# Patient Record
Sex: Female | Born: 1960 | Race: White | Hispanic: No | Smoking: Current every day smoker
Health system: Southern US, Community
[De-identification: ages and names within clinical notes are randomized; demographics above are authoritative.]

## PROBLEM LIST (undated history)

## (undated) ENCOUNTER — Ambulatory Visit (HOSPITAL_COMMUNITY): Disposition: A | Discharge status: Other Acute Inpatient Hospital | Payer: 59

## (undated) ENCOUNTER — Emergency Department (HOSPITAL_COMMUNITY): Admission: EM | Payer: Medicare Other

## (undated) DIAGNOSIS — M1990 Unspecified osteoarthritis, unspecified site: Secondary | ICD-10-CM

## (undated) DIAGNOSIS — F1010 Alcohol abuse, uncomplicated: Secondary | ICD-10-CM

## (undated) DIAGNOSIS — I469 Cardiac arrest, cause unspecified: Secondary | ICD-10-CM

## (undated) DIAGNOSIS — R55 Syncope and collapse: Secondary | ICD-10-CM

## (undated) DIAGNOSIS — F4310 Post-traumatic stress disorder, unspecified: Secondary | ICD-10-CM

## (undated) DIAGNOSIS — F410 Panic disorder [episodic paroxysmal anxiety] without agoraphobia: Secondary | ICD-10-CM

## (undated) DIAGNOSIS — G709 Myoneural disorder, unspecified: Secondary | ICD-10-CM

## (undated) DIAGNOSIS — E785 Hyperlipidemia, unspecified: Secondary | ICD-10-CM

## (undated) DIAGNOSIS — F329 Major depressive disorder, single episode, unspecified: Secondary | ICD-10-CM

## (undated) DIAGNOSIS — G8929 Other chronic pain: Secondary | ICD-10-CM

## (undated) DIAGNOSIS — K760 Fatty (change of) liver, not elsewhere classified: Secondary | ICD-10-CM

## (undated) DIAGNOSIS — I442 Atrioventricular block, complete: Secondary | ICD-10-CM

## (undated) DIAGNOSIS — F419 Anxiety disorder, unspecified: Secondary | ICD-10-CM

## (undated) DIAGNOSIS — T8859XA Other complications of anesthesia, initial encounter: Secondary | ICD-10-CM

## (undated) DIAGNOSIS — F32A Depression, unspecified: Secondary | ICD-10-CM

## (undated) DIAGNOSIS — F319 Bipolar disorder, unspecified: Secondary | ICD-10-CM

## (undated) DIAGNOSIS — I10 Essential (primary) hypertension: Secondary | ICD-10-CM

## (undated) DIAGNOSIS — R160 Hepatomegaly, not elsewhere classified: Secondary | ICD-10-CM

## (undated) DIAGNOSIS — T7840XA Allergy, unspecified, initial encounter: Secondary | ICD-10-CM

## (undated) DIAGNOSIS — K852 Alcohol induced acute pancreatitis without necrosis or infection: Secondary | ICD-10-CM

## (undated) DIAGNOSIS — F909 Attention-deficit hyperactivity disorder, unspecified type: Secondary | ICD-10-CM

## (undated) DIAGNOSIS — M810 Age-related osteoporosis without current pathological fracture: Secondary | ICD-10-CM

## (undated) DIAGNOSIS — Z95810 Presence of automatic (implantable) cardiac defibrillator: Secondary | ICD-10-CM

## (undated) DIAGNOSIS — F429 Obsessive-compulsive disorder, unspecified: Secondary | ICD-10-CM

## (undated) DIAGNOSIS — E119 Type 2 diabetes mellitus without complications: Secondary | ICD-10-CM

## (undated) DIAGNOSIS — K863 Pseudocyst of pancreas: Secondary | ICD-10-CM

## (undated) DIAGNOSIS — S8261XA Displaced fracture of lateral malleolus of right fibula, initial encounter for closed fracture: Secondary | ICD-10-CM

## (undated) DIAGNOSIS — K219 Gastro-esophageal reflux disease without esophagitis: Secondary | ICD-10-CM

## (undated) HISTORY — DX: Hyperlipidemia, unspecified: E78.5

## (undated) HISTORY — PX: FINGER SURGERY: SHX640

## (undated) HISTORY — PX: ABDOMINAL HYSTERECTOMY: SHX81

## (undated) HISTORY — DX: Atrioventricular block, complete: I44.2

## (undated) HISTORY — DX: Unspecified osteoarthritis, unspecified site: M19.90

## (undated) HISTORY — PX: MYRINGOTOMY: SUR874

## (undated) HISTORY — DX: Allergy, unspecified, initial encounter: T78.40XA

## (undated) HISTORY — DX: Hepatomegaly, not elsewhere classified: R16.0

## (undated) HISTORY — DX: Pseudocyst of pancreas: K86.3

## (undated) HISTORY — PX: TONSILLECTOMY: SUR1361

## (undated) HISTORY — DX: Syncope and collapse: R55

## (undated) HISTORY — PX: PACEMAKER PLACEMENT: SHX43

## (undated) HISTORY — PX: ANTERIOR AND POSTERIOR REPAIR: SHX5121

## (undated) HISTORY — DX: Fatty (change of) liver, not elsewhere classified: K76.0

## (undated) HISTORY — PX: RHINOPLASTY: SUR1284

## (undated) HISTORY — PX: OTHER SURGICAL HISTORY: SHX169

## (undated) HISTORY — PX: TUBAL LIGATION: SHX77

## (undated) HISTORY — PX: BUNIONECTOMY: SHX129

## (undated) HISTORY — DX: Cardiac arrest, cause unspecified: I46.9

---
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
## 1998-05-27 ENCOUNTER — Emergency Department (HOSPITAL_COMMUNITY): Admission: EM | Admit: 1998-05-27 | Discharge: 1998-05-27 | Payer: Self-pay | Admitting: Emergency Medicine

## 1998-05-29 ENCOUNTER — Encounter: Admission: RE | Admit: 1998-05-29 | Discharge: 1998-08-27 | Payer: Self-pay | Admitting: Internal Medicine

## 1998-07-17 ENCOUNTER — Encounter: Admission: RE | Admit: 1998-07-17 | Discharge: 1998-10-15 | Payer: Self-pay | Admitting: *Deleted

## 1998-09-04 ENCOUNTER — Encounter: Admission: RE | Admit: 1998-09-04 | Discharge: 1998-12-03 | Payer: Self-pay | Admitting: Orthopedic Surgery

## 2000-11-05 ENCOUNTER — Inpatient Hospital Stay (HOSPITAL_COMMUNITY): Admission: AD | Admit: 2000-11-05 | Discharge: 2000-11-05 | Payer: Self-pay | Admitting: Obstetrics & Gynecology

## 2001-04-10 ENCOUNTER — Inpatient Hospital Stay (HOSPITAL_COMMUNITY): Admission: AD | Admit: 2001-04-10 | Discharge: 2001-04-10 | Payer: Self-pay | Admitting: Obstetrics and Gynecology

## 2001-04-18 ENCOUNTER — Ambulatory Visit (HOSPITAL_COMMUNITY): Admission: RE | Admit: 2001-04-18 | Discharge: 2001-04-18 | Payer: Self-pay | Admitting: Obstetrics and Gynecology

## 2001-04-18 ENCOUNTER — Encounter: Payer: Self-pay | Admitting: Obstetrics and Gynecology

## 2001-05-11 ENCOUNTER — Encounter: Payer: Self-pay | Admitting: Obstetrics and Gynecology

## 2001-05-11 ENCOUNTER — Ambulatory Visit (HOSPITAL_COMMUNITY): Admission: RE | Admit: 2001-05-11 | Discharge: 2001-05-11 | Payer: Self-pay | Admitting: Obstetrics and Gynecology

## 2001-05-15 ENCOUNTER — Ambulatory Visit (HOSPITAL_COMMUNITY): Admission: RE | Admit: 2001-05-15 | Discharge: 2001-05-15 | Payer: Self-pay | Admitting: Obstetrics and Gynecology

## 2001-05-31 ENCOUNTER — Other Ambulatory Visit: Admission: RE | Admit: 2001-05-31 | Discharge: 2001-05-31 | Payer: Self-pay | Admitting: Obstetrics and Gynecology

## 2001-07-24 ENCOUNTER — Other Ambulatory Visit: Admission: RE | Admit: 2001-07-24 | Discharge: 2001-07-24 | Payer: Self-pay | Admitting: Obstetrics and Gynecology

## 2002-04-08 ENCOUNTER — Inpatient Hospital Stay (HOSPITAL_COMMUNITY): Admission: AD | Admit: 2002-04-08 | Discharge: 2002-04-08 | Payer: Self-pay | Admitting: Obstetrics and Gynecology

## 2002-04-10 ENCOUNTER — Inpatient Hospital Stay (HOSPITAL_COMMUNITY): Admission: AD | Admit: 2002-04-10 | Discharge: 2002-04-10 | Payer: Self-pay | Admitting: Obstetrics and Gynecology

## 2002-05-01 ENCOUNTER — Inpatient Hospital Stay (HOSPITAL_COMMUNITY): Admission: AD | Admit: 2002-05-01 | Discharge: 2002-05-01 | Payer: Self-pay | Admitting: Obstetrics and Gynecology

## 2002-05-02 ENCOUNTER — Ambulatory Visit (HOSPITAL_COMMUNITY): Admission: RE | Admit: 2002-05-02 | Discharge: 2002-05-02 | Payer: Self-pay | Admitting: Obstetrics and Gynecology

## 2002-05-02 ENCOUNTER — Encounter: Payer: Self-pay | Admitting: Obstetrics and Gynecology

## 2002-05-16 ENCOUNTER — Ambulatory Visit (HOSPITAL_COMMUNITY): Admission: RE | Admit: 2002-05-16 | Discharge: 2002-05-16 | Payer: Self-pay | Admitting: Obstetrics and Gynecology

## 2002-05-16 ENCOUNTER — Encounter: Payer: Self-pay | Admitting: Obstetrics and Gynecology

## 2002-05-21 ENCOUNTER — Inpatient Hospital Stay (HOSPITAL_COMMUNITY): Admission: AD | Admit: 2002-05-21 | Discharge: 2002-05-21 | Payer: Self-pay | Admitting: Obstetrics and Gynecology

## 2002-05-25 ENCOUNTER — Inpatient Hospital Stay (HOSPITAL_COMMUNITY): Admission: AD | Admit: 2002-05-25 | Discharge: 2002-05-25 | Payer: Self-pay | Admitting: Obstetrics and Gynecology

## 2002-06-01 ENCOUNTER — Inpatient Hospital Stay (HOSPITAL_COMMUNITY): Admission: AD | Admit: 2002-06-01 | Discharge: 2002-06-01 | Payer: Self-pay | Admitting: Obstetrics and Gynecology

## 2002-07-23 ENCOUNTER — Inpatient Hospital Stay (HOSPITAL_COMMUNITY): Admission: AD | Admit: 2002-07-23 | Discharge: 2002-07-23 | Payer: Self-pay | Admitting: Obstetrics and Gynecology

## 2002-07-27 ENCOUNTER — Inpatient Hospital Stay (HOSPITAL_COMMUNITY): Admission: AD | Admit: 2002-07-27 | Discharge: 2002-07-27 | Payer: Self-pay | Admitting: Obstetrics and Gynecology

## 2002-08-21 ENCOUNTER — Encounter: Payer: Self-pay | Admitting: Obstetrics and Gynecology

## 2002-08-21 ENCOUNTER — Inpatient Hospital Stay (HOSPITAL_COMMUNITY): Admission: AD | Admit: 2002-08-21 | Discharge: 2002-08-25 | Payer: Self-pay | Admitting: Obstetrics and Gynecology

## 2002-08-26 ENCOUNTER — Encounter: Admission: RE | Admit: 2002-08-26 | Discharge: 2002-09-25 | Payer: Self-pay | Admitting: Obstetrics and Gynecology

## 2002-09-26 ENCOUNTER — Encounter: Admission: RE | Admit: 2002-09-26 | Discharge: 2002-10-26 | Payer: Self-pay | Admitting: Obstetrics and Gynecology

## 2002-10-27 ENCOUNTER — Encounter: Admission: RE | Admit: 2002-10-27 | Discharge: 2002-11-26 | Payer: Self-pay | Admitting: Obstetrics and Gynecology

## 2002-12-27 ENCOUNTER — Encounter: Admission: RE | Admit: 2002-12-27 | Discharge: 2003-01-26 | Payer: Self-pay | Admitting: Obstetrics and Gynecology

## 2003-01-27 ENCOUNTER — Encounter: Admission: RE | Admit: 2003-01-27 | Discharge: 2003-02-26 | Payer: Self-pay | Admitting: Obstetrics and Gynecology

## 2003-12-28 HISTORY — PX: TUBAL LIGATION: SHX77

## 2004-01-14 ENCOUNTER — Other Ambulatory Visit: Admission: RE | Admit: 2004-01-14 | Discharge: 2004-01-14 | Payer: Self-pay | Admitting: Obstetrics and Gynecology

## 2004-02-18 ENCOUNTER — Inpatient Hospital Stay (HOSPITAL_COMMUNITY): Admission: AD | Admit: 2004-02-18 | Discharge: 2004-02-18 | Payer: Self-pay | Admitting: Obstetrics and Gynecology

## 2004-02-19 ENCOUNTER — Inpatient Hospital Stay (HOSPITAL_COMMUNITY): Admission: AD | Admit: 2004-02-19 | Discharge: 2004-02-19 | Payer: Self-pay | Admitting: Obstetrics and Gynecology

## 2004-03-04 ENCOUNTER — Ambulatory Visit (HOSPITAL_COMMUNITY): Admission: RE | Admit: 2004-03-04 | Discharge: 2004-03-04 | Payer: Self-pay | Admitting: Obstetrics and Gynecology

## 2004-03-15 ENCOUNTER — Inpatient Hospital Stay (HOSPITAL_COMMUNITY): Admission: AD | Admit: 2004-03-15 | Discharge: 2004-03-15 | Payer: Self-pay | Admitting: Obstetrics and Gynecology

## 2004-03-22 ENCOUNTER — Encounter (HOSPITAL_COMMUNITY): Admission: RE | Admit: 2004-03-22 | Discharge: 2004-04-21 | Payer: Self-pay | Admitting: Obstetrics and Gynecology

## 2004-04-22 ENCOUNTER — Encounter (HOSPITAL_COMMUNITY): Admission: RE | Admit: 2004-04-22 | Discharge: 2004-05-22 | Payer: Self-pay | Admitting: Obstetrics and Gynecology

## 2004-05-03 ENCOUNTER — Inpatient Hospital Stay (HOSPITAL_COMMUNITY): Admission: AD | Admit: 2004-05-03 | Discharge: 2004-05-03 | Payer: Self-pay | Admitting: Obstetrics and Gynecology

## 2004-06-05 ENCOUNTER — Encounter (HOSPITAL_COMMUNITY): Admission: RE | Admit: 2004-06-05 | Discharge: 2004-07-05 | Payer: Self-pay | Admitting: Obstetrics and Gynecology

## 2004-07-21 ENCOUNTER — Inpatient Hospital Stay (HOSPITAL_COMMUNITY): Admission: AD | Admit: 2004-07-21 | Discharge: 2004-07-23 | Payer: Self-pay | Admitting: Obstetrics and Gynecology

## 2007-12-28 HISTORY — PX: ANTERIOR AND POSTERIOR REPAIR: SHX5121

## 2007-12-28 HISTORY — PX: ABDOMINAL HYSTERECTOMY: SHX81

## 2013-03-05 ENCOUNTER — Emergency Department (HOSPITAL_BASED_OUTPATIENT_CLINIC_OR_DEPARTMENT_OTHER)
Admission: EM | Admit: 2013-03-05 | Discharge: 2013-03-05 | Discharge status: Against Medical Advice | Payer: BC Managed Care – PPO | Attending: Emergency Medicine | Admitting: Emergency Medicine

## 2013-03-05 ENCOUNTER — Encounter (HOSPITAL_BASED_OUTPATIENT_CLINIC_OR_DEPARTMENT_OTHER): Payer: Self-pay

## 2013-03-05 DIAGNOSIS — Z046 Encounter for general psychiatric examination, requested by authority: Secondary | ICD-10-CM | POA: Insufficient documentation

## 2013-03-05 HISTORY — DX: Other chronic pain: G89.29

## 2013-03-05 HISTORY — DX: Panic disorder (episodic paroxysmal anxiety): F41.0

## 2013-03-05 HISTORY — DX: Obsessive-compulsive disorder, unspecified: F42.9

## 2013-03-05 HISTORY — DX: Anxiety disorder, unspecified: F41.9

## 2013-03-05 HISTORY — DX: Attention-deficit hyperactivity disorder, unspecified type: F90.9

## 2013-03-05 HISTORY — DX: Bipolar disorder, unspecified: F31.9

## 2013-03-05 NOTE — ED Notes (Signed)
Pt seated in triage room to await treatment room-advised and agreeable

## 2013-03-05 NOTE — ED Notes (Addendum)
Pt states she was brought in by [employer] police after her father called 911-pt states her father called b/c she was in the bed crying-pt states is currently being treated for bipolar and having meds (lamictal) adjusted-pt alert/cooperative/tearful in triage

## 2013-03-05 NOTE — ED Notes (Signed)
Pt was seen leaving the premises with pt belongings in NAD. Pt was not SI or HI upon triage assessment.

## 2013-03-05 NOTE — ED Notes (Signed)
No beds available in tx area-walked to the back to advise charge RN of pt status-pt and belongings are not in triage room-EMT walked premises and security checked cameras-pt not on property

## 2013-11-07 ENCOUNTER — Ambulatory Visit (INDEPENDENT_AMBULATORY_CARE_PROVIDER_SITE_OTHER): Payer: BC Managed Care – PPO | Admitting: Family Medicine

## 2013-11-07 VITALS — BP 140/90 | HR 98 | Temp 98.4°F | Resp 18 | Ht 65.0 in | Wt 191.0 lb

## 2013-11-07 DIAGNOSIS — B079 Viral wart, unspecified: Secondary | ICD-10-CM

## 2013-11-07 DIAGNOSIS — M79609 Pain in unspecified limb: Secondary | ICD-10-CM

## 2013-11-07 NOTE — Progress Notes (Signed)
Subjective:    Patient ID: Brittney Cooper, female    DOB: [birth year], 52 y.o.   MRN: [id number]  This chart was scribed for Brittney Chick, MD by Greggory Stallion, Medical Scribe. This patient's care was started at 4:39 PM.  HPI HPI Comments: Brittney Cooper is a 52 y.o. female who presents to the office complaining of warts on her left index and middle fingers and one on her right middle finger that have worsened over the last year. She states she has had them shaven but not frozen. Pt states the doctor that did it advised her to return if she the warts start coming back. She states the one on her left middle finger is starting to hurt.   There are no active problems to display for this patient.  Past Medical History  Diagnosis Date  . Bipolar disorder   . Anxiety   . Panic attack   . Chronic pain   . OCD (obsessive compulsive disorder)   . ADHD (attention deficit hyperactivity disorder)    Past Surgical History  Procedure Laterality Date  . Myringotomy    . Tonsillectomy    . Rhinoplasty    . Abdominal hysterectomy    . Anterior and posterior repair    . Finfger surgery    . Finger surgery     Allergies  Allergen Reactions  . Codeine Nausea And Vomiting  . Demerol [Meperidine] Other (See Comments)    Hallucinations    Prior to Admission medications   Medication Sig Start Date End Date Taking? Authorizing Provider  ALPRAZolam (XANAX PO) Take by mouth.   Yes Historical Provider, MD  Amphetamine-Dextroamphetamine (ADDERALL PO) Take by mouth.   Yes Historical Provider, MD  Sertraline HCl (ZOLOFT PO) Take by mouth.   Yes Historical Provider, MD  TraZODone & Diet Manage Prod (TRAZAMINE PO) Take by mouth.   Yes Historical Provider, MD  LamoTRIgine (LAMICTAL PO) Take by mouth.    Historical Provider, MD   History   Social History  . Marital Status: Married    Spouse Name: N/A    Number of Children: N/A  . Years of Education: N/A   Occupational History  . Not on file.    Social History Main Topics  . Smoking status: Current Every Day Smoker  . Smokeless tobacco: Not on file  . Alcohol Use: Yes  . Drug Use: No  . Sexual Activity: Not on file   Other Topics Concern  . Not on file   Social History Narrative  . No narrative on file    Review of Systems  Skin:       Warts      Objective:   Physical Exam  Constitutional: She is oriented to person, place, and time. She appears well-developed and well-nourished. No distress.  HENT:  Head: Normocephalic and atraumatic.  Eyes: EOM are normal.  Neck: Neck supple. No tracheal deviation present.  Cardiovascular: Normal rate.   Pulmonary/Chest: Effort normal. No respiratory distress.  Musculoskeletal: Normal range of motion.  Neurological: She is alert and oriented to person, place, and time.  Skin: Skin is warm and dry.  Left hand has warty lesion on distal third finger extending to lateral nailbed. Similar warty lesion on second distal finger along lateral nailbed. Small warty lesion R third midshaft.  Psychiatric: She has a normal mood and affect. Her behavior is normal.   Procedure note:  Consent obtained by patient. Warty lesions on second and third digit of left  hand shaved down. Cryo therapy for 10 seconds to lesions on second and third on left hand. Cryo therapy to third finger on right hand.  Cryotherapy repeated x 3.  Filed Vitals:   11/07/13 1559  BP: 140/90  Pulse: 98  Temp: 98.4 F (36.9 C)  TempSrc: Oral  Resp: 18  Height: 5\' 5"  (1.651 m)  Weight: 191 lb (86.637 kg)  SpO2: 97%      Assessment & Plan:  Wart   1.  Warty lesions fingers x 3: New.  S/p shaving and cryotherapy to warty lesions; recommended RTC in 2-4 weeks for repeat cryotherapy.   I personally performed the services described in this documentation, which was scribed in my presence.  The recorded information has been reviewed and is accurate.

## 2013-11-07 NOTE — Patient Instructions (Signed)
1.  Return in 2-4 weeks for repeat freezing of warts if still present. 2.  Keep warts clean for the next week.

## 2013-12-19 ENCOUNTER — Ambulatory Visit (INDEPENDENT_AMBULATORY_CARE_PROVIDER_SITE_OTHER): Payer: BC Managed Care – PPO | Admitting: Family Medicine

## 2013-12-19 VITALS — BP 120/74 | HR 103 | Temp 98.1°F | Resp 16 | Ht 65.0 in | Wt 190.4 lb

## 2013-12-19 DIAGNOSIS — B079 Viral wart, unspecified: Secondary | ICD-10-CM

## 2013-12-19 NOTE — Progress Notes (Signed)
°  This chart was scribed for Elvina Sidle, MD by Joaquin Music, ED Scribe. This patient was seen in room Room/bed 10 and the patient's care was started at 11:02 AM. Subjective:    Patient ID: Brittney Cooper, female    DOB: Jul 05, 1961, 52 y.o.   MRN: [id number] Chief Complaint  Patient presents with   wart removal    on fingers of left hand   HPI Brittney Cooper is a 52 y.o. female who presents to the [facility] complaining of wart removal. Pt states she had her wart removed last month and states the warts are now returning. Pt reports having the warts for about 3 years. She states she has stopped biting her nails this month.  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  Review of Systems  Skin: Positive for rash.   Objective:   Physical Exam  Skin:  L index and Middle & R middle has warts.    BP 120/74   Pulse 103   Temp(Src) 98.1 F (36.7 C) (Oral)   Resp 16   Ht 5\' 5"  (1.651 m)   Wt 190 lb 6.4 oz (86.365 kg)   BMI 31.68 kg/m2   SpO2 96% Assessment & Plan:  Recheck 1 month  Warts  Signed, Elvina Sidle, MD

## 2014-09-05 ENCOUNTER — Ambulatory Visit (INDEPENDENT_AMBULATORY_CARE_PROVIDER_SITE_OTHER): Payer: BC Managed Care – PPO | Admitting: Family Medicine

## 2014-09-05 ENCOUNTER — Encounter: Payer: Self-pay | Admitting: Family Medicine

## 2014-09-05 VITALS — BP 138/85 | HR 86 | Ht 64.0 in | Wt 185.0 lb

## 2014-09-05 DIAGNOSIS — M25519 Pain in unspecified shoulder: Secondary | ICD-10-CM

## 2014-09-05 DIAGNOSIS — M25512 Pain in left shoulder: Secondary | ICD-10-CM

## 2014-09-05 MED ORDER — METHYLPREDNISOLONE ACETATE 40 MG/ML IJ SUSP
40.0000 mg | Freq: Once | INTRAMUSCULAR | Status: AC
  Administered 2014-09-05: 40 mg via INTRA_ARTICULAR

## 2014-09-05 NOTE — Patient Instructions (Signed)
You have rotator cuff impingement Try to avoid painful activities (overhead activities, lifting with extended arm) as much as possible. Aleve 2 tabs twice a day with food OR ibuprofen 3 tabs three times a day with food for pain and inflammation. Can take tylenol in addition to this. Subacromial injection may be beneficial to help with pain and to decrease inflammation - you were given this today. Consider physical therapy with transition to home exercise program. Do home exercise program with theraband and scapular stabilization exercises daily - these are very important for long term relief even if an injection was given.  3 sets of 10 once a day - wait a few days before starting this. If not improving at follow-up we will consider further imaging, physical therapy and/or nitro patches. Follow up with me in 1 month to 6 weeks.

## 2014-09-09 ENCOUNTER — Encounter: Payer: Self-pay | Admitting: Family Medicine

## 2014-09-09 DIAGNOSIS — M25512 Pain in left shoulder: Secondary | ICD-10-CM | POA: Insufficient documentation

## 2014-09-09 NOTE — Assessment & Plan Note (Signed)
2/2 rotator cuff impingement.  Start home exercise program.  Tylenol, nsaids.  Subacromial injection given today.  F/u in 1 month to 6 weeks.  Consider PT, nitro patches if not improving.  After informed written consent, patient was seated on exam table. Left shoulder was prepped with alcohol swab and utilizing posterior approach, patient's left subacromial space was injected with 3:1 marcaine: depomedrol. Patient tolerated the procedure well without immediate complications.

## 2014-09-09 NOTE — Progress Notes (Signed)
Patient ID: Brittney Cooper, female   DOB: [birth year], 53 y.o.   MRN: [id number]  PCP: [name], MD  Subjective:   HPI: Patient is a 53 y.o. female here for left shoulder pain.  Patient reports pain in left shoulder started in April of this year. Recalls getting a tetanus shot in left arm (was not given too high from where she points) and having pain shortly after this. Worsening motion. Pain worse throughout day. + night pain. No prior issues with left shoulder. Is right handed. No swelling or bruising. Takes tylenol.  Past Medical History  Diagnosis Date  . Bipolar disorder   . Anxiety   . Panic attack   . Chronic pain   . OCD (obsessive compulsive disorder)   . ADHD (attention deficit hyperactivity disorder)   . Arthritis     Current Outpatient Prescriptions on File Prior to Visit  Medication Sig Dispense Refill  . ALPRAZolam (XANAX PO) Take by mouth.      . Amphetamine-Dextroamphetamine (ADDERALL PO) Take by mouth.      . Sertraline HCl (ZOLOFT PO) Take by mouth.      . TraZODone & Diet Manage Prod (TRAZAMINE PO) Take by mouth.      . LamoTRIgine (LAMICTAL PO) Take by mouth.       No current facility-administered medications on file prior to visit.    Past Surgical History  Procedure Laterality Date  . Myringotomy    . Tonsillectomy    . Rhinoplasty    . Abdominal hysterectomy    . Anterior and posterior repair    . Finfger surgery    . Finger surgery      Allergies  Allergen Reactions  . Codeine Nausea And Vomiting  . Demerol [Meperidine] Other (See Comments)    Hallucinations     History   Social History  . Marital Status: Married    Spouse Name: N/A    Number of Children: N/A  . Years of Education: N/A   Occupational History  . Not on file.   Social History Main Topics  . Smoking status: Current Every Day Smoker -- 0.50 packs/day    Types: Cigarettes  . Smokeless tobacco: Not on file  . Alcohol Use: Yes  . Drug Use: No  . Sexual  Activity: Not on file   Other Topics Concern  . Not on file   Social History Narrative  . No narrative on file    No family history on file.  BP 138/85  Pulse 86  Ht  (1.626 m)  Wt 185 lb (83.915 kg)  BMI 31.74 kg/m2  Review of Systems: See HPI above.    Objective:  Physical Exam:  Gen: NAD  Left shoulder: No swelling, ecchymoses.  No gross deformity. No TTP. FROM with painful arc. Positive Hawkins, Neers. Negative Speeds, Yergasons. Strength 5/5 with empty can and resisted internal/external rotation.  Painful empty can. Negative apprehension. NV intact distally.    Assessment & Plan:  1. Left shoulder pain - 2/2 rotator cuff impingement.  Start home exercise program.  Tylenol, nsaids.  Subacromial injection given today.  F/u in 1 month to 6 weeks.  Consider PT, nitro patches if not improving.  After informed written consent, patient was seated on exam table. Left shoulder was prepped with alcohol swab and utilizing posterior approach, patient's left subacromial space was injected with 3:1 marcaine: depomedrol. Patient tolerated the procedure well without immediate complications.

## 2014-09-20 ENCOUNTER — Telehealth: Payer: Self-pay | Admitting: Family Medicine

## 2014-09-20 NOTE — Telephone Encounter (Signed)
Spoke with patient and she wants to do PT for left shoulder. Will set up with PT downstairs.

## 2014-09-20 NOTE — Addendum Note (Signed)
Addended by: Kathi Simpers F on: 09/20/2014 09:38 AM   Modules accepted: Orders

## 2014-09-30 ENCOUNTER — Ambulatory Visit: Payer: BC Managed Care – PPO | Attending: Family Medicine | Admitting: Physical Therapy

## 2014-09-30 DIAGNOSIS — F42 Obsessive-compulsive disorder: Secondary | ICD-10-CM | POA: Insufficient documentation

## 2014-09-30 DIAGNOSIS — M25512 Pain in left shoulder: Secondary | ICD-10-CM | POA: Diagnosis not present

## 2014-09-30 DIAGNOSIS — R030 Elevated blood-pressure reading, without diagnosis of hypertension: Secondary | ICD-10-CM | POA: Insufficient documentation

## 2014-09-30 DIAGNOSIS — F909 Attention-deficit hyperactivity disorder, unspecified type: Secondary | ICD-10-CM | POA: Insufficient documentation

## 2014-09-30 DIAGNOSIS — Z5189 Encounter for other specified aftercare: Secondary | ICD-10-CM | POA: Diagnosis not present

## 2014-09-30 DIAGNOSIS — F419 Anxiety disorder, unspecified: Secondary | ICD-10-CM | POA: Diagnosis not present

## 2014-10-07 ENCOUNTER — Ambulatory Visit: Payer: BC Managed Care – PPO | Admitting: Physical Therapy

## 2014-10-07 DIAGNOSIS — Z5189 Encounter for other specified aftercare: Secondary | ICD-10-CM | POA: Diagnosis not present

## 2014-10-09 ENCOUNTER — Ambulatory Visit: Payer: BC Managed Care – PPO | Admitting: Physical Therapy

## 2014-10-15 ENCOUNTER — Ambulatory Visit: Payer: BC Managed Care – PPO

## 2014-10-18 ENCOUNTER — Ambulatory Visit: Payer: BC Managed Care – PPO | Admitting: Rehabilitation

## 2014-10-22 ENCOUNTER — Ambulatory Visit: Payer: BC Managed Care – PPO | Admitting: Rehabilitation

## 2014-10-22 DIAGNOSIS — Z5189 Encounter for other specified aftercare: Secondary | ICD-10-CM | POA: Diagnosis not present

## 2014-10-25 ENCOUNTER — Ambulatory Visit: Payer: BC Managed Care – PPO | Admitting: Rehabilitation

## 2014-10-25 DIAGNOSIS — Z5189 Encounter for other specified aftercare: Secondary | ICD-10-CM | POA: Diagnosis not present

## 2014-10-28 ENCOUNTER — Ambulatory Visit: Payer: BC Managed Care – PPO

## 2014-10-31 ENCOUNTER — Ambulatory Visit: Payer: BC Managed Care – PPO | Attending: Family Medicine

## 2014-10-31 DIAGNOSIS — M25512 Pain in left shoulder: Secondary | ICD-10-CM | POA: Diagnosis not present

## 2014-10-31 DIAGNOSIS — F909 Attention-deficit hyperactivity disorder, unspecified type: Secondary | ICD-10-CM | POA: Diagnosis not present

## 2014-10-31 DIAGNOSIS — R030 Elevated blood-pressure reading, without diagnosis of hypertension: Secondary | ICD-10-CM | POA: Insufficient documentation

## 2014-10-31 DIAGNOSIS — Z5189 Encounter for other specified aftercare: Secondary | ICD-10-CM | POA: Insufficient documentation

## 2014-10-31 DIAGNOSIS — F419 Anxiety disorder, unspecified: Secondary | ICD-10-CM | POA: Insufficient documentation

## 2014-10-31 DIAGNOSIS — F42 Obsessive-compulsive disorder: Secondary | ICD-10-CM | POA: Insufficient documentation

## 2015-04-11 ENCOUNTER — Emergency Department (HOSPITAL_BASED_OUTPATIENT_CLINIC_OR_DEPARTMENT_OTHER)
Admission: EM | Admit: 2015-04-11 | Discharge: 2015-04-11 | Discharge status: Against Medical Advice | Payer: Self-pay | Attending: Emergency Medicine | Admitting: Emergency Medicine

## 2015-04-11 ENCOUNTER — Encounter (HOSPITAL_BASED_OUTPATIENT_CLINIC_OR_DEPARTMENT_OTHER): Payer: Self-pay

## 2015-04-11 DIAGNOSIS — I10 Essential (primary) hypertension: Secondary | ICD-10-CM | POA: Insufficient documentation

## 2015-04-11 DIAGNOSIS — S3992XA Unspecified injury of lower back, initial encounter: Secondary | ICD-10-CM | POA: Insufficient documentation

## 2015-04-11 DIAGNOSIS — G8929 Other chronic pain: Secondary | ICD-10-CM | POA: Insufficient documentation

## 2015-04-11 DIAGNOSIS — Y92410 Unspecified street and highway as the place of occurrence of the external cause: Secondary | ICD-10-CM | POA: Insufficient documentation

## 2015-04-11 DIAGNOSIS — Z720 Tobacco use: Secondary | ICD-10-CM | POA: Insufficient documentation

## 2015-04-11 DIAGNOSIS — Y9389 Activity, other specified: Secondary | ICD-10-CM | POA: Insufficient documentation

## 2015-04-11 DIAGNOSIS — Y998 Other external cause status: Secondary | ICD-10-CM | POA: Insufficient documentation

## 2015-04-11 HISTORY — DX: Essential (primary) hypertension: I10
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
## 2015-06-27 DIAGNOSIS — S8261XA Displaced fracture of lateral malleolus of right fibula, initial encounter for closed fracture: Secondary | ICD-10-CM

## 2015-06-27 HISTORY — DX: Displaced fracture of lateral malleolus of right fibula, initial encounter for closed fracture: S82.61XA

## 2015-07-14 ENCOUNTER — Emergency Department (HOSPITAL_BASED_OUTPATIENT_CLINIC_OR_DEPARTMENT_OTHER)
Admission: EM | Admit: 2015-07-14 | Discharge: 2015-07-15 | Disposition: A | Discharge status: Home / Self Care | Payer: Medicaid Other | Attending: Emergency Medicine | Admitting: Emergency Medicine

## 2015-07-14 ENCOUNTER — Emergency Department (HOSPITAL_BASED_OUTPATIENT_CLINIC_OR_DEPARTMENT_OTHER): Payer: Medicaid Other

## 2015-07-14 ENCOUNTER — Encounter (HOSPITAL_BASED_OUTPATIENT_CLINIC_OR_DEPARTMENT_OTHER): Payer: Self-pay | Admitting: *Deleted

## 2015-07-14 DIAGNOSIS — R55 Syncope and collapse: Secondary | ICD-10-CM

## 2015-07-14 DIAGNOSIS — S82831A Other fracture of upper and lower end of right fibula, initial encounter for closed fracture: Secondary | ICD-10-CM | POA: Diagnosis not present

## 2015-07-14 DIAGNOSIS — S0101XA Laceration without foreign body of scalp, initial encounter: Secondary | ICD-10-CM | POA: Insufficient documentation

## 2015-07-14 DIAGNOSIS — F42 Obsessive-compulsive disorder: Secondary | ICD-10-CM | POA: Diagnosis not present

## 2015-07-14 DIAGNOSIS — F410 Panic disorder [episodic paroxysmal anxiety] without agoraphobia: Secondary | ICD-10-CM | POA: Diagnosis not present

## 2015-07-14 DIAGNOSIS — Z8739 Personal history of other diseases of the musculoskeletal system and connective tissue: Secondary | ICD-10-CM | POA: Insufficient documentation

## 2015-07-14 DIAGNOSIS — Z720 Tobacco use: Secondary | ICD-10-CM | POA: Insufficient documentation

## 2015-07-14 DIAGNOSIS — F909 Attention-deficit hyperactivity disorder, unspecified type: Secondary | ICD-10-CM | POA: Diagnosis not present

## 2015-07-14 DIAGNOSIS — Y929 Unspecified place or not applicable: Secondary | ICD-10-CM | POA: Diagnosis not present

## 2015-07-14 DIAGNOSIS — Y998 Other external cause status: Secondary | ICD-10-CM | POA: Insufficient documentation

## 2015-07-14 DIAGNOSIS — W1839XA Other fall on same level, initial encounter: Secondary | ICD-10-CM | POA: Diagnosis not present

## 2015-07-14 DIAGNOSIS — S59901A Unspecified injury of right elbow, initial encounter: Secondary | ICD-10-CM | POA: Insufficient documentation

## 2015-07-14 DIAGNOSIS — I10 Essential (primary) hypertension: Secondary | ICD-10-CM | POA: Insufficient documentation

## 2015-07-14 DIAGNOSIS — S99911A Unspecified injury of right ankle, initial encounter: Secondary | ICD-10-CM | POA: Diagnosis present

## 2015-07-14 DIAGNOSIS — S82401A Unspecified fracture of shaft of right fibula, initial encounter for closed fracture: Secondary | ICD-10-CM

## 2015-07-14 DIAGNOSIS — Y9389 Activity, other specified: Secondary | ICD-10-CM | POA: Insufficient documentation

## 2015-07-14 DIAGNOSIS — G8929 Other chronic pain: Secondary | ICD-10-CM | POA: Diagnosis not present

## 2015-07-14 DIAGNOSIS — F319 Bipolar disorder, unspecified: Secondary | ICD-10-CM | POA: Diagnosis not present

## 2015-07-14 LAB — CBC
HEMATOCRIT: 45.2 % (ref 36.0–46.0)
Hemoglobin: 15.5 g/dL — ABNORMAL HIGH (ref 12.0–15.0)
MCH: 32.6 pg (ref 26.0–34.0)
MCHC: 34.3 g/dL (ref 30.0–36.0)
MCV: 95 fL (ref 78.0–100.0)
PLATELETS: 313 10*3/uL (ref 150–400)
RBC: 4.76 MIL/uL (ref 3.87–5.11)
RDW: 13.1 % (ref 11.5–15.5)
WBC: 9.8 10*3/uL (ref 4.0–10.5)

## 2015-07-14 LAB — COMPREHENSIVE METABOLIC PANEL
ALBUMIN: 4.3 g/dL (ref 3.5–5.0)
ALK PHOS: 94 U/L (ref 38–126)
ALT: 24 U/L (ref 14–54)
ANION GAP: 14 (ref 5–15)
AST: 17 U/L (ref 15–41)
BUN: 14 mg/dL (ref 6–20)
CALCIUM: 9.5 mg/dL (ref 8.9–10.3)
CHLORIDE: 104 mmol/L (ref 101–111)
CO2: 23 mmol/L (ref 22–32)
Creatinine, Ser: 0.88 mg/dL (ref 0.44–1.00)
GFR calc non Af Amer: >60 mL/min (ref >60)
Glucose, Bld: 106 mg/dL — ABNORMAL HIGH (ref 65–99)
POTASSIUM: 3 mmol/L — AB (ref 3.5–5.1)
SODIUM: 141 mmol/L (ref 135–145)
TOTAL PROTEIN: 7.9 g/dL (ref 6.5–8.1)
Total Bilirubin: 0.2 mg/dL — ABNORMAL LOW (ref 0.3–1.2)

## 2015-07-14 MED ORDER — POTASSIUM CHLORIDE CRYS ER 20 MEQ PO TBCR
40.0000 meq | EXTENDED_RELEASE_TABLET | Freq: Once | ORAL | Status: AC
  Administered 2015-07-14: 40 meq via ORAL
  Filled 2015-07-14: qty 2

## 2015-07-14 MED ORDER — LIDOCAINE HCL 2 % IJ SOLN
10.0000 mL | Freq: Once | INTRAMUSCULAR | Status: AC
  Administered 2015-07-14: 200 mg
  Filled 2015-07-14: qty 20

## 2015-07-14 MED ORDER — SODIUM CHLORIDE 0.9 % IV BOLUS (SEPSIS)
1000.0000 mL | Freq: Once | INTRAVENOUS | Status: AC
  Administered 2015-07-14: 1000 mL via INTRAVENOUS

## 2015-07-14 MED ORDER — OXYCODONE-ACETAMINOPHEN 5-325 MG PO TABS: 1.0000 | ORAL_TABLET | Freq: Four times a day (QID) | ORAL | Status: DC | PRN
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
## 2015-07-14 NOTE — Discharge Instructions (Signed)
Fibular Fracture, Ankle, Adult, Undisplaced, Treated With Immobilization A simple fracture of the bone below the knee on the outside of your leg (fibula) usually heals without problems. CAUSES Typically, a fibular fracture occurs as a result of trauma. A blow to the side of your leg or a powerful twisting movement can cause a fracture. Fibular fractures are often seen as a result of football, soccer, or skiing injuries. SYMPTOMS Symptoms of a fibular fracture can include:  Pain.  Shortening or abnormal alignment of your lower leg (angulation). DIAGNOSIS A health care provider will need to examine the leg. X-ray exams will be ordered for further to confirm the fracture and evaluate the extent and of the injury. TREATMENT  Typically, a cast or immobilizer is applied. Sometimes a splint is placed on these fractures if it is needed for comfort or if the bones are badly out of place. Crutches may be needed to help you get around.  HOME CARE INSTRUCTIONS   Apply ice to the injured area:  Put ice in a plastic bag.  Place a towel between your skin and the bag.  Leave the ice on for 20 minutes, 2-3 times a day.  Use crutches as directed. Resume walking without crutches as directed by your health care provider or when comfortable doing so.  Only take over-the-counter or prescription medicines for pain, discomfort, or fever as directed by your health care provider.  Keeping your leg raised may lessen swelling.  If you have a removable splint or boot, do not remove the boot unless directed by your health care provider.  Do not not drive a car or operate a motor vehicle until your health care provider specifically tells you it is safe to do so. SEEK IMMEDIATE MEDICAL CARE IF:   Your cast gets damaged or breaks.  You have continued severe pain or more swelling than you did before the cast was put on, or the pain is not controlled with medications.  Your skin or nails below the injury turn  blue or grey, or feel cold or numb.  There is a bad smell or pus coming from under the cast.  You develop severe pain in ankle or foot. MAKE SURE YOU:   Understand these instructions.  Will watch your condition.  Will get help right away if you are not doing well or get worse. Document Released: 09/04/2002 Document Revised: 10/03/2013 Document Reviewed: 07/25/2013 [facility] Patient Information 2015 [place], [place]. This information is not intended to replace advice given to you by your health care provider. Make sure you discuss any questions you have with your health care provider.  Syncope Syncope is a medical term for fainting or passing out. This means you lose consciousness and drop to the ground. People are generally unconscious for less than 5 minutes. You may have some muscle twitches for up to 15 seconds before waking up and returning to normal. Syncope occurs more often in older adults, but it can happen to anyone. While most causes of syncope are not dangerous, syncope can be a sign of a serious medical problem. It is important to seek medical care.  CAUSES  Syncope is caused by a sudden drop in blood flow to the brain. The specific cause is often not determined. Factors that can bring on syncope include:  Taking medicines that lower blood pressure.  Sudden changes in posture, such as standing up quickly.  Taking more medicine than prescribed.  Standing in one place for too long.  Seizure disorders.  Dehydration and excessive exposure to heat.  Low blood sugar (hypoglycemia).  Straining to have a bowel movement.  Heart disease, irregular heartbeat, or other circulatory problems.  Fear, emotional distress, seeing blood, or severe pain. SYMPTOMS  Right before fainting, you may:  Feel dizzy or light-headed.  Feel nauseous.  See all white or all black in your field of vision.  Have cold, clammy skin. DIAGNOSIS  Your health care provider will ask about your  symptoms, perform a physical exam, and perform an electrocardiogram (ECG) to record the electrical activity of your heart. Your health care provider may also perform other heart or blood tests to determine the cause of your syncope which may include:  Transthoracic echocardiogram (TTE). During echocardiography, sound waves are used to evaluate how blood flows through your heart.  Transesophageal echocardiogram (TEE).  Cardiac monitoring. This allows your health care provider to monitor your heart rate and rhythm in real time.  Holter monitor. This is a portable device that records your heartbeat and can help diagnose heart arrhythmias. It allows your health care provider to track your heart activity for several days, if needed.  Stress tests by exercise or by giving medicine that makes the heart beat faster. TREATMENT  In most cases, no treatment is needed. Depending on the cause of your syncope, your health care provider may recommend changing or stopping some of your medicines. HOME CARE INSTRUCTIONS  Have someone stay with you until you feel stable.  Do not drive, use machinery, or play sports until your health care provider says it is okay.  Keep all follow-up appointments as directed by your health care provider.  Lie down right away if you start feeling like you might faint. Breathe deeply and steadily. Wait until all the symptoms have passed.  Drink enough fluids to keep your urine clear or pale yellow.  If you are taking blood pressure or heart medicine, get up slowly and take several minutes to sit and then stand. This can reduce dizziness. SEEK IMMEDIATE MEDICAL CARE IF:   You have a severe headache.  You have unusual pain in the chest, abdomen, or back.  You are bleeding from your mouth or rectum, or you have black or tarry stool.  You have an irregular or very fast heartbeat.  You have pain with breathing.  You have repeated fainting or seizure-like jerking during an  episode.  You faint when sitting or lying down.  You have confusion.  You have trouble walking.  You have severe weakness.  You have vision problems. If you fainted, call your local emergency services (911 in [place] Do not drive yourself to the hospital.  MAKE SURE YOU:  Understand these instructions.  Will watch your condition.  Will get help right away if you are not doing well or get worse. Document Released: 12/13/2005 Document Revised: 12/18/2013 Document Reviewed: 02/11/2012 [facility] Patient Information 2015 [place], [place]. This information is not intended to replace advice given to you by your health care provider. Make sure you discuss any questions you have with your health care provider.  Laceration Care, Adult A laceration is a cut or lesion that goes through all layers of the skin and into the tissue just beneath the skin. TREATMENT  Some lacerations may not require closure. Some lacerations may not be able to be closed due to an increased risk of infection. It is important to see your caregiver as soon as possible after an injury to minimize the risk of infection and maximize the  opportunity for successful closure. If closure is appropriate, pain medicines may be given, if needed. The wound will be cleaned to help prevent infection. Your caregiver will use stitches (sutures), staples, wound glue (adhesive), or skin adhesive strips to repair the laceration. These tools bring the skin edges together to allow for faster healing and a better cosmetic outcome. However, all wounds will heal with a scar. Once the wound has healed, scarring can be minimized by covering the wound with sunscreen during the day for 1 full year. HOME CARE INSTRUCTIONS  For sutures or staples:  Keep the wound clean and dry.  If you were given a bandage (dressing), you should change it at least once a day. Also, change the dressing if it becomes wet or dirty, or as directed by your caregiver.  Wash  the wound with soap and water 2 times a day. Rinse the wound off with water to remove all soap. Pat the wound dry with a clean towel.  After cleaning, apply a thin layer of the antibiotic ointment as recommended by your caregiver. This will help prevent infection and keep the dressing from sticking.  You may shower as usual after the first 24 hours. Do not soak the wound in water until the sutures are removed.  Only take over-the-counter or prescription medicines for pain, discomfort, or fever as directed by your caregiver.  Get your sutures or staples removed as directed by your caregiver. For skin adhesive strips:  Keep the wound clean and dry.  Do not get the skin adhesive strips wet. You may bathe carefully, using caution to keep the wound dry.  If the wound gets wet, pat it dry with a clean towel.  Skin adhesive strips will fall off on their own. You may trim the strips as the wound heals. Do not remove skin adhesive strips that are still stuck to the wound. They will fall off in time. For wound adhesive:  You may briefly wet your wound in the shower or bath. Do not soak or scrub the wound. Do not swim. Avoid periods of heavy perspiration until the skin adhesive has fallen off on its own. After showering or bathing, gently pat the wound dry with a clean towel.  Do not apply liquid medicine, cream medicine, or ointment medicine to your wound while the skin adhesive is in place. This may loosen the film before your wound is healed.  If a dressing is placed over the wound, be careful not to apply tape directly over the skin adhesive. This may cause the adhesive to be pulled off before the wound is healed.  Avoid prolonged exposure to sunlight or tanning lamps while the skin adhesive is in place. Exposure to ultraviolet light in the first year will darken the scar.  The skin adhesive will usually remain in place for 5 to 10 days, then naturally fall off the skin. Do not pick at the  adhesive film. You may need a tetanus shot if:  You cannot remember when you had your last tetanus shot.  You have never had a tetanus shot. If you get a tetanus shot, your arm may swell, get red, and feel warm to the touch. This is common and not a problem. If you need a tetanus shot and you choose not to have one, there is a rare chance of getting tetanus. Sickness from tetanus can be serious. SEEK MEDICAL CARE IF:   You have redness, swelling, or increasing pain in the wound.  You see  a red line that goes away from the wound.  You have yellowish-white fluid (pus) coming from the wound.  You have a fever.  You notice a bad smell coming from the wound or dressing.  Your wound breaks open before or after sutures have been removed.  You notice something coming out of the wound such as wood or glass.  Your wound is on your hand or foot and you cannot move a finger or toe. SEEK IMMEDIATE MEDICAL CARE IF:   Your pain is not controlled with prescribed medicine.  You have severe swelling around the wound causing pain and numbness or a change in color in your arm, hand, leg, or foot.  Your wound splits open and starts bleeding.  You have worsening numbness, weakness, or loss of function of any joint around or beyond the wound.  You develop painful lumps near the wound or on the skin anywhere on your body. MAKE SURE YOU:   Understand these instructions.  Will watch your condition.  Will get help right away if you are not doing well or get worse. Document Released: 12/13/2005 Document Revised: 03/06/2012 Document Reviewed: 06/08/2011 [facility] Patient Information 2015 [employer], [place]. This information is not intended to replace advice given to you by your health care provider. Make sure you discuss any questions you have with your health care provider.

## 2015-07-14 NOTE — ED Provider Notes (Signed)
CSN: [id number]   Arrival date & time 07/14/15 1939  History  This chart was scribed for  Elwin Mocha, MD by Bethel Born, ED Scribe. This patient was seen in room MH11/MH11 and the patient's care was started at 8:04 PM.  Chief Complaint  Patient presents with  . Loss of Consciousness  . Ankle Injury    HPI Patient is a 54 y.o. female presenting with syncope and lower extremity injury. The history is provided by the patient. No language interpreter was used.  Loss of Consciousness Episode history:  Single Most recent episode:  Today Progression:  Resolved Chronicity:  New Context: standing up   Witnessed: yes   Worsened by:  Nothing tried Ineffective treatments:  None tried Associated symptoms: headaches   Associated symptoms: no chest pain, no focal weakness and no shortness of breath   Ankle Injury This is a new problem. The current episode started less than 1 hour ago. The problem occurs constantly. The problem has not changed since onset.Associated symptoms include headaches. Pertinent negatives include no chest pain, no abdominal pain and no shortness of breath. The symptoms are aggravated by walking. Nothing relieves the symptoms. She has tried nothing for the symptoms.   Brittney Cooper is a 54 y.o. female with PMHx of HTN who presents to the Emergency Department complaining of syncope just PTA. The pt was napping and stood up to help her son and lost consciousness. Associated symptoms include right ankle pain, right elbow pain, headache and scalp laceration. She resumed her antihypertensive this week after a period of abstinence. Pt admits to 3 shots of Vodka today. She denies chest pain, SOB, new abdominal pain, nausea, and vomiting. She uses an aspirin daily. Tobacco+.   Past Medical History  Diagnosis Date  . Bipolar disorder   . Anxiety   . Panic attack   . Chronic pain   . OCD (obsessive compulsive disorder)   . ADHD (attention deficit hyperactivity disorder)   .  Arthritis   . Hypertension     Past Surgical History  Procedure Laterality Date  . Myringotomy    . Tonsillectomy    . Rhinoplasty    . Abdominal hysterectomy    . Anterior and posterior repair    . Finfger surgery    . Finger surgery      No family history on file.  History  Substance Use Topics  . Smoking status: Current Every Day Smoker -- 0.50 packs/day    Types: Cigarettes  . Smokeless tobacco: Not on file  . Alcohol Use: Yes     Review of Systems  HENT:       Right scalp laceration  Respiratory: Negative for shortness of breath.   Cardiovascular: Positive for syncope. Negative for chest pain.  Gastrointestinal: Negative for abdominal pain.  Musculoskeletal:       Right ankle pain Right elbow pain  Neurological: Positive for headaches. Negative for focal weakness.  All other systems reviewed and are negative.   Home Medications   Prior to Admission medications   Medication Sig Start Date End Date Taking? Authorizing Provider  ALPRAZolam (XANAX PO) Take by mouth.    Historical Provider, MD  Amphetamine-Dextroamphetamine (ADDERALL PO) Take by mouth.    Historical Provider, MD  LamoTRIgine (LAMICTAL PO) Take by mouth.    Historical Provider, MD  losartan (COZAAR) 100 MG tablet  06/12/14   Historical Provider, MD  Sertraline HCl (ZOLOFT PO) Take by mouth.    Historical Provider, MD  TraZODone &  Diet Manage Prod (TRAZAMINE PO) Take by mouth.    Historical Provider, MD    Allergies  Codeine and Demerol  Triage Vitals: BP 101/66 mmHg  Pulse 81  Temp(Src) 98.3 F (36.8 C) (Oral)  Resp 18  Ht 5\' 4"  (1.626 m)  Wt 200 lb (90.719 kg)  BMI 34.31 kg/m2  SpO2 95%  Physical Exam  Constitutional: She is oriented to person, place, and time. She appears well-developed and well-nourished. No distress.  HENT:  Head: Normocephalic and atraumatic.  Mouth/Throat: Oropharynx is clear and moist.  Eyes: EOM are normal. Pupils are equal, round, and reactive to light.  Neck:  Normal range of motion. Neck supple.  Cardiovascular: Normal rate and regular rhythm.  Exam reveals no friction rub.   No murmur heard. Pulmonary/Chest: Effort normal and breath sounds normal. No respiratory distress. She has no wheezes. She has no rales.  Abdominal: Soft. She exhibits no distension. There is no tenderness. There is no rebound.  Musculoskeletal: Normal range of motion. She exhibits no edema.       Right elbow: She exhibits swelling (posterior, olecranon bursa swelling. No erythema, no cellulitis. No redness. No ROM restrictions). She exhibits normal range of motion, no effusion, no deformity and no laceration. No tenderness found.       Right ankle: She exhibits swelling. Tenderness. Lateral malleolus tenderness found.  Neurological: She is alert and oriented to person, place, and time.  Skin: She is not diaphoretic.  Nursing note and vitals reviewed.   ED Course  LACERATION REPAIR Date/Time: 07/14/2015 11:34 PM Performed by: Elwin MochaWALDEN, Shemeka Wardle Authorized by: Elwin MochaWALDEN, Abdulhadi Stopa Consent: Verbal consent obtained. Time out: Immediately prior to procedure a "time out" was called to verify the correct patient, procedure, equipment, support staff and site/side marked as required. Body area: head/neck Location details: scalp Laceration length: 2 cm Foreign bodies: no foreign bodies Tendon involvement: none Nerve involvement: none Vascular damage: no Anesthesia: local infiltration Local anesthetic: lidocaine 1% without epinephrine Anesthetic total: 4 ml Patient sedated: no Preparation: Patient was prepped and draped in the usual sterile fashion. Irrigation solution: saline Irrigation method: jet lavage Amount of cleaning: standard Debridement: none Degree of undermining: none Skin closure: staples Number of sutures: 3 Technique: simple Approximation: close Approximation difficulty: simple Patient tolerance: Patient tolerated the procedure well with no immediate  complications    DIAGNOSTIC STUDIES: Oxygen Saturation is 95% on RA, normal by my interpretation.    COORDINATION OF CARE: 8:10 PM Discussed treatment plan which includes CT head, right elbow XR, right ankle XR, lab work, IVF, and laceration repair with pt at bedside and pt agreed to plan.  10:34 PM I re-evaluated the patient and provided an update on the results of her XRs.   Labs Review- Labs Reviewed - No data to display  Imaging Review No results found.  EKG Interpretation None   EKG Interpretation  Date/Time:  Monday July 14 2015 20:27:16 EDT Ventricular Rate:  75 PR Interval:  144 QRS Duration: 84 QT Interval:  364 QTC Calculation: 406 R Axis:   35 Text Interpretation:  Normal sinus rhythm Low voltage QRS Nonspecific ST and T wave abnormality Abnormal ECG No prior for comparison Confirmed by Gwendolyn GrantWALDEN  MD, Cloys Vera ([id number]) on 07/14/2015 8:30:27 PM           MDM   Final diagnoses:  Scalp laceration, initial encounter  Right fibular fracture, closed, initial encounter  Syncope, unspecified syncope type     54 year old female here after syncopal episode. She was  bending over to do something blacked out. She had been drinking today. She denies any chest pain, shortness of breath. She did sustain right elbow swelling, right ankle swelling, and a scalp laceration on the right parietal area. Lack repair as above. Tetanus up-to-date.  Patient here well-appearing. We'll give fluids. EKG without any acute ischemic changes. No prior EKGs. Given fluids. She is ambulating with a limp but not dizzy. Elbow x-ray is normal. Ankle x-ray shows fibular fracture. Put in a splint and will follow-up with Dr. Eulah Pont in 2 days. Head CT normal. Stable for discharge.  I personally performed the services described in this documentation, which was scribed in my presence. The recorded information has been reviewed and is accurate.     Elwin Mocha, MD 07/14/15 [phone]

## 2015-07-14 NOTE — ED Notes (Signed)
IV NS is going at present time.  Pt. In Radiology before NS Scanned.

## 2015-07-21 NOTE — H&P (Signed)
PREOPERATIVE H&P  Chief Complaint: DISPLACED FRACTURE OF LATERAL MALLEOLUS OF UNSPECIFIED FIBULA INTIAL ENCOUNTER FOR OPEN FRACTURE TYPE I OR II  HPI: Brittney Cooper is a 54 y.o. female who presents for preoperative history and physical with a diagnosis of DISPLACED FRACTURE OF LATERAL MALLEOLUS OF UNSPECIFIED FIBULA INTIAL ENCOUNTER FOR OPEN FRACTURE TYPE I OR II. Symptoms are rated as moderate to severe, and have been worsening.  This is significantly impairing activities of daily living.  She has elected for surgical management.   Past Medical History  Diagnosis Date  . Bipolar disorder   . Anxiety   . Panic attack   . Chronic pain   . OCD (obsessive compulsive disorder)   . ADHD (attention deficit hyperactivity disorder)   . Arthritis   . Hypertension    Past Surgical History  Procedure Laterality Date  . Myringotomy    . Tonsillectomy    . Rhinoplasty    . Abdominal hysterectomy    . Anterior and posterior repair    . Finfger surgery    . Finger surgery     History   Social History  . Marital Status: Married    Spouse Name: N/A  . Number of Children: N/A  . Years of Education: N/A   Social History Main Topics  . Smoking status: Current Every Day Smoker -- 0.50 packs/day    Types: Cigarettes  . Smokeless tobacco: Not on file  . Alcohol Use: Yes  . Drug Use: No  . Sexual Activity: Not on file   Other Topics Concern  . Not on file   Social History Narrative   No family history on file. Allergies  Allergen Reactions  . Codeine Nausea And Vomiting  . Demerol [Meperidine] Other (See Comments)    Hallucinations    Prior to Admission medications   Medication Sig Start Date End Date Taking? Authorizing Provider  ALPRAZolam (XANAX PO) Take by mouth.    Historical Provider, MD  Amphetamine-Dextroamphetamine (ADDERALL PO) Take by mouth.    Historical Provider, MD  LamoTRIgine (LAMICTAL PO) Take by mouth.    Historical Provider, MD  losartan (COZAAR) 100 MG  tablet  06/12/14   Historical Provider, MD  oxyCODONE-acetaminophen (PERCOCET) 5-325 MG per tablet Take 1 tablet by mouth every 6 (six) hours as needed for moderate pain. 07/14/15   Elwin Mocha, MD  Sertraline HCl (ZOLOFT PO) Take by mouth.    Historical Provider, MD  TraZODone & Diet Manage Prod (TRAZAMINE PO) Take by mouth.    Historical Provider, MD     Positive ROS: All other systems have been reviewed and were otherwise negative with the exception of those mentioned in the HPI and as above.  Physical Exam: General: Alert, no acute distress Cardiovascular: No pedal edema Respiratory: No cyanosis, no use of accessory musculature GI: No organomegaly, abdomen is soft and non-tender Skin: No lesions in the area of chief complaint Neurologic: Sensation intact distally Psychiatric: Patient is competent for consent with normal mood and affect Lymphatic: No axillary or cervical lymphadenopathy  MUSCULOSKELETAL: Her right lower extremity skin is benign, except for some swelling.  She is neurovascularly intact to her right lower extremity.  Assessment: DISPLACED FRACTURE OF LATERAL MALLEOLUS OF UNSPECIFIED FIBULA INTIAL ENCOUNTER FOR OPEN FRACTURE TYPE I OR II  Plan: Plan for Procedure(s): OPEN REDUCTION INTERNAL FIXATION (ORIF) RIGHT ANKLE   The risks benefits and alternatives were discussed with the patient including but not limited to the risks of nonoperative treatment, versus surgical intervention  including infection, bleeding, nerve injury,  blood clots, cardiopulmonary complications, morbidity, mortality, among others, and they were willing to proceed.   Lynann Bologna, PA-C  07/21/2015 12:27 PM

## 2015-07-22 ENCOUNTER — Encounter (HOSPITAL_BASED_OUTPATIENT_CLINIC_OR_DEPARTMENT_OTHER): Payer: Self-pay | Admitting: *Deleted

## 2015-07-25 ENCOUNTER — Ambulatory Visit (HOSPITAL_BASED_OUTPATIENT_CLINIC_OR_DEPARTMENT_OTHER)
Admission: RE | Admit: 2015-07-25 | Discharge: 2015-07-25 | Disposition: A | Discharge status: Home / Self Care | Payer: Medicaid Other | Source: Ambulatory Visit | Attending: Orthopedic Surgery | Admitting: Orthopedic Surgery

## 2015-07-25 ENCOUNTER — Encounter (HOSPITAL_BASED_OUTPATIENT_CLINIC_OR_DEPARTMENT_OTHER)
Admission: RE | Disposition: A | Discharge status: Home / Self Care | Payer: Self-pay | Source: Ambulatory Visit | Attending: Orthopedic Surgery

## 2015-07-25 ENCOUNTER — Ambulatory Visit (HOSPITAL_BASED_OUTPATIENT_CLINIC_OR_DEPARTMENT_OTHER): Payer: Medicaid Other | Admitting: Certified Registered"

## 2015-07-25 ENCOUNTER — Encounter (HOSPITAL_BASED_OUTPATIENT_CLINIC_OR_DEPARTMENT_OTHER): Payer: Self-pay | Admitting: Certified Registered"

## 2015-07-25 DIAGNOSIS — F42 Obsessive-compulsive disorder: Secondary | ICD-10-CM | POA: Diagnosis not present

## 2015-07-25 DIAGNOSIS — Z79899 Other long term (current) drug therapy: Secondary | ICD-10-CM | POA: Diagnosis not present

## 2015-07-25 DIAGNOSIS — M1990 Unspecified osteoarthritis, unspecified site: Secondary | ICD-10-CM | POA: Insufficient documentation

## 2015-07-25 DIAGNOSIS — I10 Essential (primary) hypertension: Secondary | ICD-10-CM | POA: Insufficient documentation

## 2015-07-25 DIAGNOSIS — S8261XB Displaced fracture of lateral malleolus of right fibula, initial encounter for open fracture type I or II: Secondary | ICD-10-CM | POA: Insufficient documentation

## 2015-07-25 DIAGNOSIS — X58XXXA Exposure to other specified factors, initial encounter: Secondary | ICD-10-CM | POA: Insufficient documentation

## 2015-07-25 DIAGNOSIS — F909 Attention-deficit hyperactivity disorder, unspecified type: Secondary | ICD-10-CM | POA: Diagnosis not present

## 2015-07-25 DIAGNOSIS — S8261XA Displaced fracture of lateral malleolus of right fibula, initial encounter for closed fracture: Secondary | ICD-10-CM | POA: Diagnosis present

## 2015-07-25 DIAGNOSIS — F319 Bipolar disorder, unspecified: Secondary | ICD-10-CM | POA: Diagnosis not present

## 2015-07-25 DIAGNOSIS — F17210 Nicotine dependence, cigarettes, uncomplicated: Secondary | ICD-10-CM | POA: Diagnosis not present

## 2015-07-25 DIAGNOSIS — F419 Anxiety disorder, unspecified: Secondary | ICD-10-CM | POA: Diagnosis not present

## 2015-07-25 HISTORY — PX: ORIF ANKLE FRACTURE: SHX5408

## 2015-07-25 HISTORY — DX: Depression, unspecified: F32.A

## 2015-07-25 HISTORY — DX: Major depressive disorder, single episode, unspecified: F32.9

## 2015-07-25 HISTORY — DX: Displaced fracture of lateral malleolus of right fibula, initial encounter for closed fracture: S82.61XA

## 2015-07-25 LAB — POCT HEMOGLOBIN-HEMACUE: Hemoglobin: 16 g/dL — ABNORMAL HIGH (ref 12.0–15.0)

## 2015-07-25 LAB — POCT I-STAT, CHEM 8
BUN: 15 mg/dL (ref 6–20)
CALCIUM ION: 1.24 mmol/L — AB (ref 1.12–1.23)
CHLORIDE: 101 mmol/L (ref 101–111)
Creatinine, Ser: 0.8 mg/dL (ref 0.44–1.00)
Glucose, Bld: 95 mg/dL (ref 65–99)
HEMATOCRIT: 45 % (ref 36.0–46.0)
Hemoglobin: 15.3 g/dL — ABNORMAL HIGH (ref 12.0–15.0)
POTASSIUM: 3.9 mmol/L (ref 3.5–5.1)
SODIUM: 139 mmol/L (ref 135–145)
TCO2: 27 mmol/L (ref 0–100)

## 2015-07-25 SURGERY — OPEN REDUCTION INTERNAL FIXATION (ORIF) ANKLE FRACTURE
Anesthesia: Regional | Site: Ankle | Laterality: Right

## 2015-07-25 MED ORDER — DOCUSATE SODIUM 100 MG PO CAPS: 100.0000 mg | ORAL_CAPSULE | Freq: Two times a day (BID) | ORAL | Status: DC

## 2015-07-25 MED ORDER — POTASSIUM CHLORIDE IN NACL 20-0.45 MEQ/L-% IV SOLN: INTRAVENOUS | Status: DC

## 2015-07-25 MED ORDER — MIDAZOLAM HCL 2 MG/2ML IJ SOLN
INTRAMUSCULAR | Status: AC
  Filled 2015-07-25: qty 2

## 2015-07-25 MED ORDER — PROPOFOL 500 MG/50ML IV EMUL
INTRAVENOUS | Status: AC
  Filled 2015-07-25: qty 50

## 2015-07-25 MED ORDER — ONDANSETRON HCL 4 MG/2ML IJ SOLN
INTRAMUSCULAR | Status: DC | PRN
  Administered 2015-07-25: 4 mg via INTRAVENOUS

## 2015-07-25 MED ORDER — SUCCINYLCHOLINE CHLORIDE 20 MG/ML IJ SOLN
INTRAMUSCULAR | Status: DC | PRN
  Administered 2015-07-25: 20 mg via INTRAVENOUS

## 2015-07-25 MED ORDER — CEFAZOLIN SODIUM-DEXTROSE 2-3 GM-% IV SOLR
2.0000 g | INTRAVENOUS | Status: AC
  Administered 2015-07-25: 2 g via INTRAVENOUS

## 2015-07-25 MED ORDER — ACETAMINOPHEN 500 MG PO TABS
ORAL_TABLET | ORAL | Status: AC
  Filled 2015-07-25: qty 2

## 2015-07-25 MED ORDER — FENTANYL CITRATE (PF) 100 MCG/2ML IJ SOLN
50.0000 ug | INTRAMUSCULAR | Status: AC | PRN
  Administered 2015-07-25: 50 ug via INTRAVENOUS
  Administered 2015-07-25: 100 ug via INTRAVENOUS
  Administered 2015-07-25: 50 ug via INTRAVENOUS

## 2015-07-25 MED ORDER — DEXAMETHASONE SODIUM PHOSPHATE 10 MG/ML IJ SOLN
INTRAMUSCULAR | Status: DC | PRN
  Administered 2015-07-25: 10 mg via INTRAVENOUS

## 2015-07-25 MED ORDER — CHLORHEXIDINE GLUCONATE 4 % EX LIQD: 60.0000 mL | Freq: Once | CUTANEOUS | Status: DC

## 2015-07-25 MED ORDER — LACTATED RINGERS IV SOLN
INTRAVENOUS | Status: DC
  Administered 2015-07-25 (×2): via INTRAVENOUS

## 2015-07-25 MED ORDER — SCOPOLAMINE 1 MG/3DAYS TD PT72: 1.0000 | MEDICATED_PATCH | Freq: Once | TRANSDERMAL | Status: DC | PRN

## 2015-07-25 MED ORDER — FENTANYL CITRATE (PF) 100 MCG/2ML IJ SOLN
INTRAMUSCULAR | Status: AC
  Filled 2015-07-25: qty 6

## 2015-07-25 MED ORDER — BUPIVACAINE HCL (PF) 0.5 % IJ SOLN
INTRAMUSCULAR | Status: DC | PRN
  Administered 2015-07-25: 10 mL

## 2015-07-25 MED ORDER — ONDANSETRON HCL 4 MG PO TABS: 4.0000 mg | ORAL_TABLET | Freq: Three times a day (TID) | ORAL | Status: DC | PRN

## 2015-07-25 MED ORDER — CEFAZOLIN SODIUM-DEXTROSE 2-3 GM-% IV SOLR
INTRAVENOUS | Status: AC
  Filled 2015-07-25: qty 50

## 2015-07-25 MED ORDER — PROPOFOL 10 MG/ML IV BOLUS
INTRAVENOUS | Status: DC | PRN
  Administered 2015-07-25: 230 mg via INTRAVENOUS
  Administered 2015-07-25: 70 mg via INTRAVENOUS

## 2015-07-25 MED ORDER — BUPIVACAINE-EPINEPHRINE (PF) 0.5% -1:200000 IJ SOLN
INTRAMUSCULAR | Status: DC | PRN
  Administered 2015-07-25: 30 mL via PERINEURAL

## 2015-07-25 MED ORDER — OXYCODONE-ACETAMINOPHEN 5-325 MG PO TABS: 1.0000 | ORAL_TABLET | ORAL | Status: DC | PRN

## 2015-07-25 MED ORDER — ACETAMINOPHEN 500 MG PO TABS: 1000.0000 mg | ORAL_TABLET | Freq: Once | ORAL | Status: DC

## 2015-07-25 MED ORDER — MIDAZOLAM HCL 2 MG/2ML IJ SOLN
1.0000 mg | INTRAMUSCULAR | Status: DC | PRN
  Administered 2015-07-25: 2 mg via INTRAVENOUS

## 2015-07-25 MED ORDER — FENTANYL CITRATE (PF) 100 MCG/2ML IJ SOLN
INTRAMUSCULAR | Status: AC
  Filled 2015-07-25: qty 2

## 2015-07-25 MED ORDER — LIDOCAINE HCL (CARDIAC) 20 MG/ML IV SOLN
INTRAVENOUS | Status: DC | PRN
  Administered 2015-07-25: 40 mg via INTRAVENOUS

## 2015-07-25 MED ORDER — GLYCOPYRROLATE 0.2 MG/ML IJ SOLN: 0.2000 mg | Freq: Once | INTRAMUSCULAR | Status: DC | PRN

## 2015-07-25 SURGICAL SUPPLY — 76 items
BANDAGE ELASTIC 4 VELCRO ST LF (GAUZE/BANDAGES/DRESSINGS) ×2 IMPLANT
BANDAGE ELASTIC 6 VELCRO ST LF (GAUZE/BANDAGES/DRESSINGS) ×2 IMPLANT
BANDAGE ESMARK 6X9 LF (GAUZE/BANDAGES/DRESSINGS) ×1 IMPLANT
BIT DRILL 2.5X125 (BIT) ×1 IMPLANT
BIT DRILL 3.5X125 (BIT) IMPLANT
BIT DRILL COUNTER SINK (DRILL) IMPLANT
BLADE SURG 15 STRL LF DISP TIS (BLADE) ×2 IMPLANT
BLADE SURG 15 STRL SS (BLADE) ×4
BNDG CMPR 9X6 STRL LF SNTH (GAUZE/BANDAGES/DRESSINGS) ×1
BNDG COHESIVE 4X5 TAN STRL (GAUZE/BANDAGES/DRESSINGS) ×1 IMPLANT
BNDG ESMARK 6X9 LF (GAUZE/BANDAGES/DRESSINGS) ×2
CHLORAPREP W/TINT 26ML (MISCELLANEOUS) ×2 IMPLANT
CLSR STERI-STRIP ANTIMIC 1/2X4 (GAUZE/BANDAGES/DRESSINGS) ×2 IMPLANT
COVER BACK TABLE 60X90IN (DRAPES) ×2 IMPLANT
CUFF TOURNIQUET SINGLE 24IN (TOURNIQUET CUFF) IMPLANT
CUFF TOURNIQUET SINGLE 34IN LL (TOURNIQUET CUFF) ×1 IMPLANT
DECANTER SPIKE VIAL GLASS SM (MISCELLANEOUS) IMPLANT
DRAPE EXTREMITY T 121X128X90 (DRAPE) ×2 IMPLANT
DRAPE OEC MINIVIEW 54X84 (DRAPES) ×2 IMPLANT
DRAPE U 20/CS (DRAPES) ×2 IMPLANT
DRAPE U-SHAPE 47X51 STRL (DRAPES) ×2 IMPLANT
DRILL BIT 3.5X125 (BIT) ×2
DRILL COUNTER SINK (DRILL) ×2
DRSG EMULSION OIL 3X3 NADH (GAUZE/BANDAGES/DRESSINGS) ×2 IMPLANT
DRSG PAD ABDOMINAL 8X10 ST (GAUZE/BANDAGES/DRESSINGS) ×2 IMPLANT
ELECT REM PT RETURN 9FT ADLT (ELECTROSURGICAL) ×2
ELECTRODE REM PT RTRN 9FT ADLT (ELECTROSURGICAL) ×1 IMPLANT
GAUZE SPONGE 4X4 12PLY STRL (GAUZE/BANDAGES/DRESSINGS) ×2 IMPLANT
GLOVE BIO SURGEON STRL SZ 6.5 (GLOVE) ×1 IMPLANT
GLOVE BIO SURGEON STRL SZ7 (GLOVE) ×3 IMPLANT
GLOVE BIO SURGEON STRL SZ7.5 (GLOVE) ×2 IMPLANT
GLOVE BIO SURGEON STRL SZ8 (GLOVE) IMPLANT
GLOVE BIOGEL PI IND STRL 7.0 (GLOVE) ×1 IMPLANT
GLOVE BIOGEL PI IND STRL 7.5 (GLOVE) IMPLANT
GLOVE BIOGEL PI IND STRL 8 (GLOVE) ×1 IMPLANT
GLOVE BIOGEL PI INDICATOR 7.0 (GLOVE) ×2
GLOVE BIOGEL PI INDICATOR 7.5 (GLOVE) ×1
GLOVE BIOGEL PI INDICATOR 8 (GLOVE) ×1
GOWN STRL REUS W/ TWL LRG LVL3 (GOWN DISPOSABLE) ×3 IMPLANT
GOWN STRL REUS W/ TWL XL LVL3 (GOWN DISPOSABLE) IMPLANT
GOWN STRL REUS W/TWL LRG LVL3 (GOWN DISPOSABLE) ×6
GOWN STRL REUS W/TWL XL LVL3 (GOWN DISPOSABLE) ×2
NEEDLE HYPO 22GX1.5 SAFETY (NEEDLE) IMPLANT
NS IRRIG 1000ML POUR BTL (IV SOLUTION) ×2 IMPLANT
PACK BASIN DAY SURGERY FS (CUSTOM PROCEDURE TRAY) ×2 IMPLANT
PAD CAST 4YDX4 CTTN HI CHSV (CAST SUPPLIES) ×1 IMPLANT
PADDING CAST ABS 4INX4YD NS (CAST SUPPLIES) ×2
PADDING CAST ABS COTTON 4X4 ST (CAST SUPPLIES) ×2 IMPLANT
PADDING CAST COTTON 4X4 STRL (CAST SUPPLIES)
PADDING CAST COTTON 6X4 STRL (CAST SUPPLIES) ×2 IMPLANT
PENCIL BUTTON HOLSTER BLD 10FT (ELECTRODE) ×2 IMPLANT
PLATE TUBUAL 1/3 6H (Plate) ×1 IMPLANT
SCREW CANC FT 16X4X2.5XHEX (Screw) IMPLANT
SCREW CANCELLOUS 4.0X16MM (Screw) ×2 IMPLANT
SCREW CANCELLOUS FT 4.0X18MM (Screw) ×1 IMPLANT
SCREW CORTEX ST MATTA 3.5X14 (Screw) ×1 IMPLANT
SCREW CORTEX ST MATTA 3.5X16MM (Screw) ×1 IMPLANT
SCREW CORTEX ST MATTA 3.5X18MM (Screw) ×1 IMPLANT
SCREW CORTEX ST MATTA 3.5X24 (Screw) ×1 IMPLANT
SLEEVE SCD COMPRESS KNEE MED (MISCELLANEOUS) ×1 IMPLANT
SPLINT FAST PLASTER 5X30 (CAST SUPPLIES) ×20
SPLINT PLASTER CAST FAST 5X30 (CAST SUPPLIES) ×20 IMPLANT
SPONGE LAP 4X18 X RAY DECT (DISPOSABLE) ×2 IMPLANT
SUCTION FRAZIER TIP 10 FR DISP (SUCTIONS) ×1 IMPLANT
SUT ETHILON 3 0 PS 1 (SUTURE) ×2 IMPLANT
SUT MNCRL AB 4-0 PS2 18 (SUTURE) IMPLANT
SUT MON AB 2-0 CT1 36 (SUTURE) IMPLANT
SUT VIC AB 0 SH 27 (SUTURE) ×2 IMPLANT
SUT VIC AB 2-0 SH 27 (SUTURE)
SUT VIC AB 2-0 SH 27XBRD (SUTURE) IMPLANT
SYR BULB 3OZ (MISCELLANEOUS) ×2 IMPLANT
SYR CONTROL 10ML LL (SYRINGE) IMPLANT
TOWEL OR 17X24 6PK STRL BLUE (TOWEL DISPOSABLE) ×4 IMPLANT
TOWEL OR NON WOVEN STRL DISP B (DISPOSABLE) ×2 IMPLANT
TUBE CONNECTING 20X1/4 (TUBING) ×2 IMPLANT
UNDERPAD 30X30 (UNDERPADS AND DIAPERS) ×2 IMPLANT

## 2015-07-25 NOTE — Discharge Instructions (Signed)
Keep splint clean and dry till follow up  Non-weight bearing in the right leg  ASA  daily for DVT prophylaxis  Regional Anesthesia Blocks  1. Numbness or the inability to move the "blocked" extremity may last from 3-48 hours after placement. The length of time depends on the medication injected and your individual response to the medication. If the numbness is not going away after 48 hours, call your surgeon.  2. The extremity that is blocked will need to be protected until the numbness is gone and the  Strength has returned. Because you cannot feel it, you will need to take extra care to avoid injury. Because it may be weak, you may have difficulty moving it or using it. You may not know what position it is in without looking at it while the block is in effect.  3. For blocks in the legs and feet, returning to weight bearing and walking needs to be done carefully. You will need to wait until the numbness is entirely gone and the strength has returned. You should be able to move your leg and foot normally before you try and bear weight or walk. You will need someone to be with you when you first try to ensure you do not fall and possibly risk injury.  4. Bruising and tenderness at the needle site are common side effects and will resolve in a few days.  5. Persistent numbness or new problems with movement should be communicated to the surgeon or the [facility] [phone] [facility] [phone]).   Post Anesthesia Home Care Instructions  Activity: Get plenty of rest for the remainder of the day. A responsible adult should stay with you for 24 hours following the procedure.  For the next 24 hours, DO NOT: -Drive a car -Advertising copywriter -Drink alcoholic beverages -Take any medication unless instructed by your physician -Make any legal decisions or sign important papers.  Meals: Start with liquid foods such as gelatin or soup. Progress to regular foods  as tolerated. Avoid greasy, spicy, heavy foods. If nausea and/or vomiting occur, drink only clear liquids until the nausea and/or vomiting subsides. Call your physician if vomiting continues.  Special Instructions/Symptoms: Your throat may feel dry or sore from the anesthesia or the breathing tube placed in your throat during surgery. If this causes discomfort, gargle with warm salt water. The discomfort should disappear within 24 hours.  If you had a scopolamine patch placed behind your ear for the management of post- operative nausea and/or vomiting:  1. The medication in the patch is effective for 72 hours, after which it should be removed.  Wrap patch in a tissue and discard in the trash. Wash hands thoroughly with soap and water. 2. You may remove the patch earlier than 72 hours if you experience unpleasant side effects which may include dry mouth, dizziness or visual disturbances. 3. Avoid touching the patch. Wash your hands with soap and water after contact with the patch.

## 2015-07-25 NOTE — Anesthesia Postprocedure Evaluation (Signed)
  Anesthesia Post-op Note  Patient: Brittney Cooper  Procedure(s) Performed: Procedure(s): OPEN REDUCTION INTERNAL FIXATION (ORIF) RIGHT ANKLE  (Right)  Patient Location: PACU  Anesthesia Type:General and block  Level of Consciousness: awake and alert   Airway and Oxygen Therapy: Patient Spontanous Breathing  Post-op Pain: Controlled  Post-op Assessment: Post-op Vital signs reviewed, Patient's Cardiovascular Status Stable and Respiratory Function Stable  Post-op Vital Signs: Reviewed  Filed Vitals:   07/25/15 1600  BP: 119/76  Pulse: 76  Temp:   Resp: 17    Complications: No apparent anesthesia complications

## 2015-07-25 NOTE — Progress Notes (Signed)
Assisted Dr. Autumn Patty with right, ultrasound guided, popliteal/saphenous, block. Side rails up, monitors on throughout procedure. See vital signs in flow sheet. Tolerated Procedure well.

## 2015-07-25 NOTE — Anesthesia Preprocedure Evaluation (Signed)
Anesthesia Evaluation  Patient identified by MRN, date of birth, ID band Patient awake    Reviewed: Allergy & Precautions, H&P , NPO status , Patient's Chart, lab work & pertinent test results  Airway Mallampati: II  TM Distance: >3 FB Neck ROM: Full    Dental no notable dental hx. (+) Teeth Intact, Dental Advisory Given   Pulmonary Current Smoker,  breath sounds clear to auscultation  Pulmonary exam normal       Cardiovascular hypertension, Pt. on medications Rhythm:Regular Rate:Normal     Neuro/Psych Anxiety Depression Bipolar Disorder negative neurological ROS     GI/Hepatic negative GI ROS, Neg liver ROS,   Endo/Other  negative endocrine ROS  Renal/GU negative Renal ROS  negative genitourinary   Musculoskeletal  (+) Arthritis -, Osteoarthritis,    Abdominal   Peds  Hematology negative hematology ROS (+)   Anesthesia Other Findings   Reproductive/Obstetrics negative OB ROS                             Anesthesia Physical Anesthesia Plan  ASA: II  Anesthesia Plan: General and Regional   Post-op Pain Management: GA combined w/ Regional for post-op pain   Induction: Intravenous  Airway Management Planned: LMA  Additional Equipment:   Intra-op Plan:   Post-operative Plan: Extubation in OR  Informed Consent: I have reviewed the patients History and Physical, chart, labs and discussed the procedure including the risks, benefits and alternatives for the proposed anesthesia with the patient or authorized representative who has indicated his/her understanding and acceptance.   Dental advisory given  Plan Discussed with: CRNA  Anesthesia Plan Comments:         Anesthesia Quick Evaluation

## 2015-07-25 NOTE — Op Note (Signed)
07/25/2015  3:15 PM  PATIENT:  Brittney Cooper    PRE-OPERATIVE DIAGNOSIS:  DISPLACED FRACTURE OF LATERAL MALLEOLUS OF UNSPECIFIED FIBULA INTIAL ENCOUNTER FOR OPEN FRACTURE TYPE I OR II  POST-OPERATIVE DIAGNOSIS:  Same  PROCEDURE:  OPEN REDUCTION INTERNAL FIXATION (ORIF) RIGHT ANKLE   SURGEON:  Shereese Bonnie, Jewel Baize, MD  ASSISTANT: Janalee Dane, PA-C, She was present and scrubbed throughout the case, critical for completion in a timely fashion, and for retraction, instrumentation, and closure.   ANESTHESIA:   gen  PREOPERATIVE INDICATIONS:  [name] is a  55 y.o. female with a diagnosis of DISPLACED FRACTURE OF LATERAL MALLEOLUS OF UNSPECIFIED FIBULA INTIAL ENCOUNTER FOR OPEN FRACTURE TYPE I OR II who failed conservative measures and elected for surgical management.    The risks benefits and alternatives were discussed with the patient preoperatively including but not limited to the risks of infection, bleeding, nerve injury, cardiopulmonary complications, the need for revision surgery, among others, and the patient was willing to proceed.  OPERATIVE IMPLANTS: 1/3 stainless tubular plate  OPERATIVE FINDINGS: Unstable ankle fracture. Stable syndesmosis post op  BLOOD LOSS: min  COMPLICATIONS: none  TOURNIQUET TIME: 30  OPERATIVE PROCEDURE:  Patient was identified in the preoperative holding area and site was marked by me He was transported to the operating theater and placed on the table in supine position taking care to pad all bony prominences. After a preincinduction time out anesthesia was induced. The right lower extremity was prepped and draped in normal sterile fashion and a pre-incision timeout was performed. Brittney Cooper received ancef for preoperative antibiotics.   I made a lateral incision of roughly 7 cm dissection was carried down sharply to the distal fibula and then spreading dissection was used proximally to protect the superficial peroneal nerve. I sharply incised  the periosteum and took care to protect the peroneal tendons. I then debrided the fracture site and performed a reduction maneuver which was held in place with a clamp.   I placed a lag screw across the fracture  I then selected a 6-hole one third tubular plate and placed in a neutralization fashion care was taken distally so as not to penetrate the joint with the cancellus screws.  I then stressed the syndesmosis and it was stable  The wound was then thoroughly irrigated and closed using a 0 Vicryl and absorbable Monocryl sutures. He was placed in a short leg splint.   POST OPERATIVE PLAN: Non-weightbearing. DVT prophylaxis will consist of ASA  This note was generated using a template and dragon dictation system. In light of that, I have reviewed the note and all aspects of it are applicable to this case. Any dictation errors are due to the computerized dictation system.

## 2015-07-25 NOTE — Anesthesia Procedure Notes (Addendum)
Anesthesia Regional Block:  Popliteal block  Pre-Anesthetic Checklist: ,, timeout performed, Correct Patient, Correct Site, Correct Laterality, Correct Procedure, Correct Position, site marked, Risks and benefits discussed, pre-op evaluation, post-op pain management  Laterality: Right  Prep: Maximum Sterile Barrier Precautions used and chloraprep       Needles:  Injection technique: Single-shot  Needle Type: Echogenic Stimulator Needle     Needle Length: 9cm 9 cm Needle Gauge: 21 and 21 G    Additional Needles:  Procedures: ultrasound guided (picture in chart) and nerve stimulator Popliteal block  Nerve Stimulator or Paresthesia:  Response: Peroneal,  Response: Tibial,   Additional Responses:   Narrative:  Start time: 07/25/2015 2:12 PM End time: 07/25/2015 2:22 PM Injection made incrementally with aspirations every 5 mL. Anesthesiologist: Gaynelle Adu  Additional Notes: 2% Lidocaine skin wheel. Saphenous block with 10cc of 0.5% Bupivicaine plain.   Procedure Name: LMA Insertion Date/Time: 07/25/2015 2:44 PM Performed by: Curly [place] Pre-anesthesia Checklist: Patient identified, Emergency Drugs available, Suction available and Patient being monitored Patient Re-evaluated:Patient Re-evaluated prior to inductionOxygen Delivery Method: Circle System Utilized Preoxygenation: Pre-oxygenation with 100% oxygen Intubation Type: IV induction Ventilation: Mask ventilation without difficulty LMA: LMA inserted LMA Size: 4.0 Number of attempts: 1 Airway Equipment and Method: Bite block Placement Confirmation: positive ETCO2 and breath sounds checked- equal and bilateral Tube secured with: Tape Dental Injury: Teeth and Oropharynx as per pre-operative assessment

## 2015-07-25 NOTE — Interval H&P Note (Signed)
History and Physical Interval Note:  07/25/2015 12:46 PM  Brittney Cooper  has presented today for surgery, with the diagnosis of DISPLACED FRACTURE OF LATERAL MALLEOLUS OF UNSPECIFIED FIBULA INTIAL ENCOUNTER FOR OPEN FRACTURE TYPE I OR II  The various methods of treatment have been discussed with the patient and family. After consideration of risks, benefits and other options for treatment, the patient has consented to  Procedure(s): OPEN REDUCTION INTERNAL FIXATION (ORIF) RIGHT ANKLE  (Right) as a surgical intervention .  The patient's history has been reviewed, patient examined, no change in status, stable for surgery.  I have reviewed the patient's chart and labs.  Questions were answered to the patient's satisfaction.     Cynithia Hakimi D

## 2015-07-25 NOTE — Transfer of Care (Signed)
Immediate Anesthesia Transfer of Care Note  Patient: Brittney Cooper  Procedure(s) Performed: Procedure(s): OPEN REDUCTION INTERNAL FIXATION (ORIF) RIGHT ANKLE  (Right)  Patient Location: PACU  Anesthesia Type:GA combined with regional for post-op pain  Level of Consciousness: awake, alert , oriented and patient cooperative  Airway & Oxygen Therapy: Patient Spontanous Breathing and Patient connected to face mask oxygen  Post-op Assessment: Report given to RN, Post -op Vital signs reviewed and stable and Patient moving all extremities  Post vital signs: Reviewed and stable  Last Vitals:  Filed Vitals:   07/25/15 1408  BP: 117/56  Temp: 36.8 C    Complications: No apparent anesthesia complications

## 2015-07-28 ENCOUNTER — Encounter (HOSPITAL_BASED_OUTPATIENT_CLINIC_OR_DEPARTMENT_OTHER): Payer: Self-pay | Admitting: Orthopedic Surgery

## 2015-07-28 NOTE — Addendum Note (Signed)
Addendum  created 07/28/15 1304 by Lance Coon, CRNA   Modules edited: Charges VN

## 2015-10-08 NOTE — Progress Notes (Signed)
ELECTROPHYSIOLOGY CONSULT NOTE  Patient ID: Brittney Cooper, MRN: [id number], DOB/AGE: 52-Oct-1962 52 y.o. Admit date: (Not on file) Date of Consult: 10/10/2015  Primary Physician: [name], MD Primary Cardiologist: newe  Chief Complaint: syncope   HPI Brittney Cooper is a 54 y.o. female  Seen for syncope. This dates back into her late teens and early 18s. These have been stereo typical and have been increasing in frequency over recent years and months. They're characterized with the prodrome with a rushing in the ears tunnel vision sensation of heart fluttering and flushing. The recovery phase is notable for residual orthostatic intolerance flushing and fatigue. There is not been nausea. There may or may not be diaphoresis. Interestingly, not withstanding the frequency of the events, they have not been witnessed and do not know what she looks like.  She denies a history of shower intolerance. She does have a history however Jacuzzi intolerance chronic orthostatic intolerance. Her diet is mildly deplete of fluid and mildly deplete of sodium.  She has children ranging in age from 19-7. She is put on 60 pounds in the last 5 years. She is taking care of her parents and her children and is under a great deal of stress.  Over the last month she has had more frequent syncope and she has actually fallen. She is hit her head on a couple of occasions and fractured her ankle on 1. These episodes are now occurring a couple of times a week the prodromata are sufficiently on some occasions that she has sufficient warning to sit down. She notes some exercise intolerance concurrent with her weight gain.  Review of the outside hospital records demonstrated evidence of orthostatic hypotension with a blood pressure going from 116--90 and heart rate going from 84--106.  She has a history of hypertension requiring medications including Cozaar and amlodipine; these are long-standing. Her only new  medications in the recent addition of Klonopin which she takes in addition to Zoloft which is long-standing; she takes Abilify; I did not ask her how long she had taken this drug. She also takes  trazodone which is an intermediate duration. She has ADD and takes Adderall.        Past Medical History  Diagnosis Date  . Bipolar disorder (HCC)   . Anxiety   . Panic attack   . Chronic pain   . OCD (obsessive compulsive disorder)   . ADHD (attention deficit hyperactivity disorder)   . Arthritis   . Hypertension   . Depression   . Fracture of lateral malleolus of right fibula   . Syncope       Surgical History:  Past Surgical History  Procedure Laterality Date  . Myringotomy    . Tonsillectomy    . Rhinoplasty    . Abdominal hysterectomy    . Anterior and posterior repair    . Finfger surgery    . Finger surgery    . Bunionectomy Bilateral   . Orif ankle fracture Right 07/25/2015    Procedure: OPEN REDUCTION INTERNAL FIXATION (ORIF) RIGHT ANKLE ;  Surgeon: Sheral Apley, MD;  Location: [facility];  Service: Orthopedics;  Laterality: Right;     Home Meds: Prior to Admission medications   Medication Sig Start Date End Date Taking? Authorizing Provider  ALPRAZolam (XANAX PO) Take 0.25 mg by mouth.     Historical Provider, MD  amLODipine (NORVASC) 5 MG tablet Take 5 mg by mouth daily.    Historical  Provider, MD  Amphetamine-Dextroamphetamine (ADDERALL PO) Take 20 mg by mouth.     Historical Provider, MD  ARIPiprazole (ABILIFY) 20 MG tablet Take 20 mg by mouth daily.    Historical Provider, MD  aspirin EC 81 MG tablet Take 81 mg by mouth daily.    Historical Provider, MD  atorvastatin (LIPITOR) 40 MG tablet Take 40 mg by mouth daily.    Historical Provider, MD  clonazePAM (KLONOPIN) 1 MG tablet Take 1 mg by mouth 2 (two) times daily.    Historical Provider, MD  docusate sodium (COLACE) 100 MG capsule Take 1 capsule (100 mg total) by mouth 2 (two) times daily.  07/25/15   Brittney Tresa EndoKelly, PA-C  losartan (COZAAR) 100 MG tablet Take 100 mg by mouth daily.  06/12/14   Historical Provider, MD  ondansetron (ZOFRAN) 4 MG tablet Take 1 tablet (4 mg total) by mouth every 8 (eight) hours as needed for nausea or vomiting. 07/25/15   Janalee DaneBrittney Kelly, PA-C  oxyCODONE-acetaminophen (PERCOCET) 5-325 MG per tablet Take 1-2 tablets by mouth every 4 (four) hours as needed for severe pain. 07/25/15   Brittney Tresa EndoKelly, PA-C  Sertraline HCl (ZOLOFT PO) Take 100 mg by mouth daily.     Historical Provider, MD  traMADol (ULTRAM) 50 MG tablet Take 100 mg by mouth every 6 (six) hours as needed.    Historical Provider, MD  traZODone (DESYREL) 150 MG tablet Take 150 mg by mouth at bedtime as needed.    Historical Provider, MD    Allergies:  Allergies  Allergen Reactions  . Codeine Nausea And Vomiting  . Demerol [Meperidine] Other (See Comments)    Hallucinations     Social History   Social History  . Marital Status: Married    Spouse Name: N/A  . Number of Children: N/A  . Years of Education: N/A   Occupational History  . Not on file.   Social History Main Topics  . Smoking status: Current Every Day Smoker -- 1.00 packs/day    Types: Cigarettes  . Smokeless tobacco: Not on file  . Alcohol Use: Yes     Comment: social  . Drug Use: No  . Sexual Activity: Not on file   Other Topics Concern  . Not on file   Social History Narrative     No family history on file.   ROS:  Please see the history of present illness.     All other systems reviewed and negative.    Physical Exam: Blood pressure 122/90, pulse 72, height 5\' 4"  (1.626 m), weight 187 lb 6.4 oz (85.004 kg). General: Well developed, well nourished female in no acute distress. Head: Normocephalic, atraumatic, sclera non-icteric, no xanthomas, nares are without discharge. EENT: normal  Lymph Nodes:  none Neck: Negative for carotid bruits. JVD not elevated. Back:without scoliosis kyphosis Lungs: Clear  bilaterally to auscultation without wheezes, rales, or rhonchi. Breathing is unlabored. Heart: RRR with S1 S2. No murmur . No rubs, or gallops appreciated. Abdomen: Soft, non-tender, non-distended with normoactive bowel sounds. No hepatomegaly. No rebound/guarding. No obvious abdominal masses. Msk:  Strength and tone appear normal for age. Extremities: No clubbing or cyanosis. No edema.  Distal pedal pulses are 2+ and equal bilaterally. Skin: Warm and Dry Neuro: Alert and oriented X 3. CN III-XII intact Grossly normal sensory and motor function . Psych:  Responds to questions appropriately with a normal affect.      Labs: Cardiac Enzymes No results for input(s): CKTOTAL, CKMB, TROPONINI in the last 72  hours. CBC Lab Results  Component Value Date   WBC 9.8 07/14/2015   HGB 15.3* 07/25/2015   HCT 45.0 07/25/2015   MCV 95.0 07/14/2015   PLT 313 07/14/2015   PROTIME: No results for input(s): LABPROT, INR in the last 72 hours. Chemistry No results for input(s): NA, K, CL, CO2, BUN, CREATININE, CALCIUM, PROT, BILITOT, ALKPHOS, ALT, AST, GLUCOSE in the last 168 hours.  Invalid input(s): LABALBU Lipids No results found for: CHOL, HDL, LDLCALC, TRIG BNP No results found for: PROBNP Thyroid Function Tests: No results for input(s): TSH, T4TOTAL, T3FREE, THYROIDAB in the last 72 hours.  Invalid input(s): FREET3 Miscellaneous No results found for: DDIMER  Radiology/Studies:  No results found.  EKG: sinus 75 14/08/38 NSTT  Assessment and Plan:  Syncope neurally mediated  Depression   Obesity  The patient has recurrent symptoms syncope dating back almost 30 years which has been increasingly frequent of late in the context of worsening psychosocial stress and depression. There may well be a correlation. There is also been significant con current weight gain which is I think likely aggravating the dizziness associated with bending.   We discussed extensively the issues of  dysautonomia, the physiology of orthstasis and positional stress.  We discussed the role of salt and water repletion, the importance of exercise, often needing to be started in the recumbent position, and the awareness of triggers and the role of ambient heat and dehydration  For now we will work on increasing fluid intake and sodium intake gingerly, I recommended the use of an abdominal binder as an nonpharmacological approach and we will utilize an event recorder to try to look to see if there is any specific trigger for these events given the frequency and the associated palpitations. I suspect that we will see only sinus rhythm.  We will also get an echocardiogram given the abnormalities on her ECG although I think that this, given the long duration of her history, is unlikely to be abnormal.        Sherryl Manges

## 2015-10-10 ENCOUNTER — Ambulatory Visit (INDEPENDENT_AMBULATORY_CARE_PROVIDER_SITE_OTHER): Payer: Self-pay | Admitting: Internal Medicine

## 2015-10-10 ENCOUNTER — Encounter: Payer: Self-pay | Admitting: Internal Medicine

## 2015-10-10 VITALS — BP 122/90 | HR 72 | Ht 64.0 in | Wt 187.4 lb

## 2015-10-10 DIAGNOSIS — R55 Syncope and collapse: Secondary | ICD-10-CM

## 2015-10-10 NOTE — Patient Instructions (Addendum)
Medication Instructions: - no changes  Labwork: - none  Procedures/Testing: - Your physician has requested that you have an echocardiogram. Echocardiography is a painless test that uses sound waves to create images of your heart. It provides your doctor with information about the size and shape of your heart and how well your heart's chambers and valves are working. This procedure takes approximately one hour. There are no restrictions for this procedure.  - Your physician has recommended that you wear a 30 day- event monitor. Event monitors are medical devices that record the heart's electrical activity. Doctors most often us these monitors to diagnose arrhythmias. Arrhythmias are problems with the speed or rhythm of the heartbeat. The monitor is a small, portable device. You can wear one while you do your normal daily activities. This is usually used to diagnose what is causing palpitations/syncope (passing out).  Follow-Up: - Your physician recommends that you schedule a follow-up appointment in: 6 weeks with Dr. Graciela HusbandsKlein.  Any Additional Special Instructions Will Be Listed Below (If Applicable). - Increase exercise - Increase sodium intake - Increase water intake - Wear an abdominal binder (can find at the pharmacy)   - [facility]- [phone]

## 2015-10-15 ENCOUNTER — Encounter: Payer: Self-pay | Admitting: Internal Medicine

## 2015-10-15 NOTE — Progress Notes (Signed)
Late entry- I was notified by checkout the day the patient was in the office, that she is a self pay and did not wish to schedule her echo/ event monitor at this time. She did not want to schedule follow up either. Per PCC's they gave the patient hardship paperwork for the monitor.

## 2015-10-27 ENCOUNTER — Emergency Department (HOSPITAL_BASED_OUTPATIENT_CLINIC_OR_DEPARTMENT_OTHER)
Admission: EM | Admit: 2015-10-27 | Discharge: 2015-10-27 | Disposition: A | Discharge status: Home / Self Care | Payer: Self-pay | Attending: Emergency Medicine | Admitting: Emergency Medicine

## 2015-10-27 ENCOUNTER — Encounter (HOSPITAL_BASED_OUTPATIENT_CLINIC_OR_DEPARTMENT_OTHER): Payer: Self-pay | Admitting: *Deleted

## 2015-10-27 ENCOUNTER — Emergency Department (HOSPITAL_BASED_OUTPATIENT_CLINIC_OR_DEPARTMENT_OTHER): Payer: Self-pay

## 2015-10-27 DIAGNOSIS — Z720 Tobacco use: Secondary | ICD-10-CM | POA: Insufficient documentation

## 2015-10-27 DIAGNOSIS — Z7982 Long term (current) use of aspirin: Secondary | ICD-10-CM | POA: Insufficient documentation

## 2015-10-27 DIAGNOSIS — F410 Panic disorder [episodic paroxysmal anxiety] without agoraphobia: Secondary | ICD-10-CM | POA: Insufficient documentation

## 2015-10-27 DIAGNOSIS — I10 Essential (primary) hypertension: Secondary | ICD-10-CM | POA: Insufficient documentation

## 2015-10-27 DIAGNOSIS — Z8781 Personal history of (healed) traumatic fracture: Secondary | ICD-10-CM | POA: Insufficient documentation

## 2015-10-27 DIAGNOSIS — G8929 Other chronic pain: Secondary | ICD-10-CM | POA: Insufficient documentation

## 2015-10-27 DIAGNOSIS — Z79899 Other long term (current) drug therapy: Secondary | ICD-10-CM | POA: Insufficient documentation

## 2015-10-27 DIAGNOSIS — M1990 Unspecified osteoarthritis, unspecified site: Secondary | ICD-10-CM | POA: Insufficient documentation

## 2015-10-27 DIAGNOSIS — F319 Bipolar disorder, unspecified: Secondary | ICD-10-CM | POA: Insufficient documentation

## 2015-10-27 DIAGNOSIS — F909 Attention-deficit hyperactivity disorder, unspecified type: Secondary | ICD-10-CM | POA: Insufficient documentation

## 2015-10-27 DIAGNOSIS — R1013 Epigastric pain: Secondary | ICD-10-CM | POA: Insufficient documentation

## 2015-10-27 DIAGNOSIS — E669 Obesity, unspecified: Secondary | ICD-10-CM | POA: Insufficient documentation

## 2015-10-27 LAB — URINALYSIS, ROUTINE W REFLEX MICROSCOPIC
Bilirubin Urine: NEGATIVE
Glucose, UA: NEGATIVE mg/dL
Ketones, ur: NEGATIVE mg/dL
LEUKOCYTES UA: NEGATIVE
NITRITE: NEGATIVE
PH: 5 (ref 5.0–8.0)
Protein, ur: NEGATIVE mg/dL
SPECIFIC GRAVITY, URINE: 1.02 (ref 1.005–1.030)
Urobilinogen, UA: 0.2 mg/dL (ref 0.0–1.0)

## 2015-10-27 LAB — COMPREHENSIVE METABOLIC PANEL
ALBUMIN: 4.1 g/dL (ref 3.5–5.0)
ALT: 20 U/L (ref 14–54)
AST: 17 U/L (ref 15–41)
Alkaline Phosphatase: 112 U/L (ref 38–126)
Anion gap: 12 (ref 5–15)
BUN: 17 mg/dL (ref 6–20)
CHLORIDE: 104 mmol/L (ref 101–111)
CO2: 22 mmol/L (ref 22–32)
Calcium: 9 mg/dL (ref 8.9–10.3)
Creatinine, Ser: 0.76 mg/dL (ref 0.44–1.00)
GFR calc Af Amer: >60 mL/min (ref >60)
GFR calc non Af Amer: >60 mL/min (ref >60)
GLUCOSE: 137 mg/dL — AB (ref 65–99)
Potassium: 3.5 mmol/L (ref 3.5–5.1)
Sodium: 138 mmol/L (ref 135–145)
Total Bilirubin: 0.6 mg/dL (ref 0.3–1.2)
Total Protein: 7.1 g/dL (ref 6.5–8.1)

## 2015-10-27 LAB — CBC WITH DIFFERENTIAL/PLATELET
BASOS ABS: 0 10*3/uL (ref 0.0–0.1)
Basophils Relative: 0 %
EOS PCT: 1 %
Eosinophils Absolute: 0.1 10*3/uL (ref 0.0–0.7)
HCT: 40.2 % (ref 36.0–46.0)
Hemoglobin: 13.8 g/dL (ref 12.0–15.0)
Lymphocytes Relative: 14 %
Lymphs Abs: 1.5 10*3/uL (ref 0.7–4.0)
MCH: 32 pg (ref 26.0–34.0)
MCHC: 34.3 g/dL (ref 30.0–36.0)
MCV: 93.3 fL (ref 78.0–100.0)
MONO ABS: 0.6 10*3/uL (ref 0.1–1.0)
Monocytes Relative: 6 %
Neutro Abs: 8.5 10*3/uL — ABNORMAL HIGH (ref 1.7–7.7)
Neutrophils Relative %: 79 %
PLATELETS: 268 10*3/uL (ref 150–400)
RBC: 4.31 MIL/uL (ref 3.87–5.11)
RDW: 13.7 % (ref 11.5–15.5)
WBC: 10.8 10*3/uL — AB (ref 4.0–10.5)

## 2015-10-27 LAB — URINE MICROSCOPIC-ADD ON

## 2015-10-27 LAB — LIPASE, BLOOD: LIPASE: 76 U/L — AB (ref 11–51)

## 2015-10-27 MED ORDER — OMEPRAZOLE 20 MG PO CPDR: 20.0000 mg | DELAYED_RELEASE_CAPSULE | Freq: Every day | ORAL | Status: DC

## 2015-10-27 MED ORDER — ONDANSETRON HCL 4 MG/2ML IJ SOLN
4.0000 mg | Freq: Once | INTRAMUSCULAR | Status: AC
  Administered 2015-10-27: 4 mg via INTRAVENOUS
  Filled 2015-10-27: qty 2

## 2015-10-27 MED ORDER — GI COCKTAIL ~~LOC~~
30.0000 mL | Freq: Once | ORAL | Status: AC
  Administered 2015-10-27: 30 mL via ORAL
  Filled 2015-10-27: qty 30

## 2015-10-27 MED ORDER — FENTANYL CITRATE (PF) 100 MCG/2ML IJ SOLN
100.0000 ug | Freq: Once | INTRAMUSCULAR | Status: AC
  Administered 2015-10-27: 100 ug via INTRAVENOUS
  Filled 2015-10-27: qty 2

## 2015-10-27 NOTE — ED Provider Notes (Signed)
CSN: [id number]     Arrival date & time 10/27/15  1722 History   First MD Initiated Contact with Patient 10/27/15 1739     Chief Complaint  Patient presents with  . Abdominal Pain     (Consider location/radiation/quality/duration/timing/severity/associated sxs/prior Treatment) HPI Brittney Cooper is a 54 y.o. female comes in for evaluation of abdominal pain. Patient reports sudden onset right upper quadrant and epigastric abdominal pain today at approximately 8:00 AM, worsening throughout the day. Her discomfort is characterized as sharp, aching. Discomfort is waxing and waning, worse at approximately 2:00 PM after eating french fries. Currently rates her discomfort as 7/10. She reports she has been taking Motrin, as prescribed by her orthopedist. She reports she has been taking 800 mg twice a day for the past week and ran out of her prescription today. Although, this intervention did not help with her pain. She also tried drinking alcohol "3 sips of liquor" this morning to try to take the edge off. She reports associated nausea but no vomiting. No fevers, chills, diarrhea or constipation. No pelvic pain, urinary symptoms, vaginal bleeding or discharge.  Past Medical History  Diagnosis Date  . Bipolar disorder (HCC)   . Anxiety   . Panic attack   . Chronic pain   . OCD (obsessive compulsive disorder)   . ADHD (attention deficit hyperactivity disorder)   . Arthritis   . Hypertension   . Depression   . Fracture of lateral malleolus of right fibula   . Syncope    Past Surgical History  Procedure Laterality Date  . Myringotomy    . Tonsillectomy    . Rhinoplasty    . Abdominal hysterectomy    . Anterior and posterior repair    . Finfger surgery    . Finger surgery    . Bunionectomy Bilateral   . Orif ankle fracture Right 07/25/2015    Procedure: OPEN REDUCTION INTERNAL FIXATION (ORIF) RIGHT ANKLE ;  Surgeon: Sheral Apley, MD;  Location: [facility];  Service:  Orthopedics;  Laterality: Right;   No family history on file. Social History  Substance Use Topics  . Smoking status: Current Every Day Smoker -- 1.00 packs/day    Types: Cigarettes  . Smokeless tobacco: Never Used  . Alcohol Use: Yes     Comment: 1x week   OB History    No data available     Review of Systems A 10 point review of systems was completed and was negative except for pertinent positives and negatives as mentioned in the history of present illness     Allergies  Codeine and Demerol  Home Medications   Prior to Admission medications   Medication Sig Start Date End Date Taking? Authorizing Provider  ALPRAZolam (XANAX PO) Take 0.25 mg by mouth.    Yes Historical Provider, MD  Amphetamine-Dextroamphetamine (ADDERALL PO) Take 20 mg by mouth.    Yes Historical Provider, MD  aspirin EC 81 MG tablet Take 81 mg by mouth daily.   Yes Historical Provider, MD  atorvastatin (LIPITOR) 40 MG tablet Take 40 mg by mouth daily.   Yes Historical Provider, MD  clonazePAM (KLONOPIN) 1 MG tablet Take 1 mg by mouth 2 (two) times daily.   Yes Historical Provider, MD  losartan (COZAAR) 100 MG tablet Take 100 mg by mouth daily.  06/12/14  Yes Historical Provider, MD  Sertraline HCl (ZOLOFT PO) Take 100 mg by mouth daily.    Yes Historical Provider, MD  traZODone (DESYREL)  150 MG tablet Take 150 mg by mouth at bedtime as needed for sleep.    Yes Historical Provider, MD  omeprazole (PRILOSEC) 20 MG capsule Take 1 capsule (20 mg total) by mouth daily. 10/27/15   Joycie PeekBenjamin Tecumseh Yeagley, PA-C  traMADol (ULTRAM) 50 MG tablet Take 100 mg by mouth every 6 (six) hours as needed.    Historical Provider, MD   BP 145/101 mmHg  Pulse 90  Temp(Src) 98.2 F (36.8 C) (Oral)  Resp 18  Ht 5\' 4"  (1.626 m)  Wt 188 lb (85.276 kg)  BMI 32.25 kg/m2  SpO2 96% Physical Exam  Constitutional: She is oriented to person, place, and time. She appears well-developed and well-nourished.  Obese white female.  HENT:   Head: Normocephalic and atraumatic.  Mouth/Throat: Oropharynx is clear and moist.  Eyes: Conjunctivae are normal. Pupils are equal, round, and reactive to light. Right eye exhibits no discharge. Left eye exhibits no discharge. No scleral icterus.  Neck: Neck supple.  Cardiovascular: Normal rate, regular rhythm and normal heart sounds.   Pulmonary/Chest: Effort normal and breath sounds normal. No respiratory distress. She has no wheezes. She has no rales.  Abdominal: Soft.  Abdomen is soft. Diffuse tenderness, worse with palpation in right upper quadrant and diffusely in epigastrium. No lesions or deformities. No rebound or guarding.  Musculoskeletal: She exhibits no tenderness.  Neurological: She is alert and oriented to person, place, and time.  Cranial Nerves II-XII grossly intact  Skin: Skin is warm and dry. No rash noted.  Psychiatric: She has a normal mood and affect.  Nursing note and vitals reviewed.   ED Course  Procedures (including critical care time) Labs Review Labs Reviewed  CBC WITH DIFFERENTIAL/PLATELET - Abnormal; Notable for the following:    WBC 10.8 (*)    Neutro Abs 8.5 (*)    All other components within normal limits  COMPREHENSIVE METABOLIC PANEL - Abnormal; Notable for the following:    Glucose, Bld 137 (*)    All other components within normal limits  LIPASE, BLOOD - Abnormal; Notable for the following:    Lipase 76 (*)    All other components within normal limits  URINALYSIS, ROUTINE W REFLEX MICROSCOPIC (NOT AT [facility]) - Abnormal; Notable for the following:    Hgb urine dipstick SMALL (*)    All other components within normal limits  URINE MICROSCOPIC-ADD ON    Imaging Review [place] Abdomen Complete  10/27/2015  CLINICAL DATA:  Initial encounter for 12 hour history of right upper quadrant pain. EXAM: ULTRASOUND ABDOMEN COMPLETE COMPARISON:  None. FINDINGS: Gallbladder: No gallstones or wall thickening visualized. No sonographic Murphy sign noted. Common  bile duct: Diameter: 3 mm Liver: Coarsening of the echotexture is compatible with fatty deposition. No focal intraparenchymal abnormality. IVC: No abnormality visualized. Pancreas: Head and body have normal sonographic features. Tail obscured by overlying bowel gas. Spleen: Size and appearance within normal limits. Right Kidney: Length: 10.7 cm. Echogenicity within normal limits. No mass or hydronephrosis visualized. Left Kidney: Length: 10.6 cm. Echogenicity within normal limits. No mass or hydronephrosis visualized. Abdominal aorta: No aneurysm visualized. Other findings: None. IMPRESSION: Unremarkable abdominal ultrasound. Specifically, no features to explain the patient's history of right upper quadrant pain. Electronically Signed   By: Kennith CenterEric  Mansell M.D.   On: 10/27/2015 19:17   I have personally reviewed and evaluated these images and lab results as part of my medical decision-making.   EKG Interpretation   Date/Time:  Monday October 27 2015 17:39:59 EDT Ventricular Rate:  88 PR Interval:  134 QRS Duration: 78 QT Interval:  360 QTC Calculation: 435 R Axis:   34 Text Interpretation:  Normal sinus rhythm Low voltage QRS Borderline ECG  Confirmed by Lincoln Brigham [phone]) on 10/27/2015 5:40:30 PM     Meds given in ED:  Medications  fentaNYL (SUBLIMAZE) injection 100 mcg (100 mcg Intravenous Given 10/27/15 1811)  ondansetron (ZOFRAN) injection 4 mg (4 mg Intravenous Given 10/27/15 1810)  gi cocktail (Maalox,Lidocaine,Donnatal) (30 mLs Oral Given 10/27/15 2009)    Discharge Medication List as of 10/27/2015  8:02 PM    START taking these medications   Details  omeprazole (PRILOSEC) 20 MG capsule Take 1 capsule (20 mg total) by mouth daily., Starting 10/27/2015, Until Discontinued, Print       Filed Vitals:   10/27/15 1730 10/27/15 2007  BP: 150/103 145/101  Pulse: 96 90  Temp: 98.2 F (36.8 C)   TempSrc: Oral   Resp: 20 18  Height:  (1.626 m)   Weight: 188 lb (85.276 kg)    SpO2: 94% 96%    MDM  Vitals stable - WNL -afebrile Pt resting comfortably in ED. PE--patient with diffuse abdominal discomfort with mild to moderate tenderness in the epigastrium and right upper quadrant. No peritoneal signs or other evidence of surgical abdomen. Labwork--lipase 76, leukocytosis of 10.8, labs otherwise noncontributory Imaging-right upper quadrant ultrasound negative  DDX--patient's symptoms likely multifactorial and secondary to mild pancreatitis as well as gastritis due to possible alcohol and NSAID overuse. Discussed clear liquid diet, cessation of alcohol use. Will DC with PPI I discussed all relevant lab findings and imaging results with pt and they verbalized understanding. Discussed f/u with PCP within 48 hrs and return precautions, pt very amenable to plan. Overall, patient appears well, nontoxic with stable vital signs and is appropriate for discharge.  Final diagnoses:  Epigastric discomfort       Joycie Peek, PA-C 10/27/15 2357  Tilden Fossa, MD 10/28/15 0001

## 2015-10-27 NOTE — ED Notes (Addendum)
Upper abd pain with nausea since 1000 today. Pt also reports she has taken a "whole bottle of motrin " this week

## 2015-10-27 NOTE — ED Notes (Signed)
Pt verbalizes understanding of d/c instructions and denies any further needs at this time. 

## 2015-10-27 NOTE — Discharge Instructions (Signed)
You were evaluated in the ED for your abdominal discomfort. There does not appear to be an emergent cause her symptoms at this time. Your symptoms are likely related to a mild inflammation of your stomach and pancreas possibly due to alcohol consumption. Please refrain from using alcohol. Please take your medications as prescribed to help with the acid in her stomach. Follow-up with your doctor for reevaluation. Return to ED for worsening symptoms.

## 2015-10-27 NOTE — ED Notes (Signed)
Gave pt ice chips and informed provider that her pain is better.

## 2015-11-29 ENCOUNTER — Encounter (HOSPITAL_BASED_OUTPATIENT_CLINIC_OR_DEPARTMENT_OTHER): Payer: Self-pay | Admitting: *Deleted

## 2015-11-29 ENCOUNTER — Emergency Department (HOSPITAL_BASED_OUTPATIENT_CLINIC_OR_DEPARTMENT_OTHER)
Admission: EM | Admit: 2015-11-29 | Discharge: 2015-11-29 | Disposition: A | Discharge status: Home / Self Care | Payer: Medicaid Other | Attending: Emergency Medicine | Admitting: Emergency Medicine

## 2015-11-29 DIAGNOSIS — E876 Hypokalemia: Secondary | ICD-10-CM

## 2015-11-29 DIAGNOSIS — F17210 Nicotine dependence, cigarettes, uncomplicated: Secondary | ICD-10-CM | POA: Insufficient documentation

## 2015-11-29 DIAGNOSIS — Z8781 Personal history of (healed) traumatic fracture: Secondary | ICD-10-CM | POA: Insufficient documentation

## 2015-11-29 DIAGNOSIS — R1010 Upper abdominal pain, unspecified: Secondary | ICD-10-CM | POA: Insufficient documentation

## 2015-11-29 DIAGNOSIS — G8929 Other chronic pain: Secondary | ICD-10-CM | POA: Insufficient documentation

## 2015-11-29 DIAGNOSIS — M1990 Unspecified osteoarthritis, unspecified site: Secondary | ICD-10-CM | POA: Insufficient documentation

## 2015-11-29 DIAGNOSIS — F1010 Alcohol abuse, uncomplicated: Secondary | ICD-10-CM

## 2015-11-29 DIAGNOSIS — I10 Essential (primary) hypertension: Secondary | ICD-10-CM | POA: Insufficient documentation

## 2015-11-29 DIAGNOSIS — Z7952 Long term (current) use of systemic steroids: Secondary | ICD-10-CM | POA: Insufficient documentation

## 2015-11-29 DIAGNOSIS — Z3202 Encounter for pregnancy test, result negative: Secondary | ICD-10-CM | POA: Insufficient documentation

## 2015-11-29 DIAGNOSIS — F319 Bipolar disorder, unspecified: Secondary | ICD-10-CM | POA: Insufficient documentation

## 2015-11-29 DIAGNOSIS — Z79899 Other long term (current) drug therapy: Secondary | ICD-10-CM | POA: Insufficient documentation

## 2015-11-29 DIAGNOSIS — K858 Other acute pancreatitis without necrosis or infection: Secondary | ICD-10-CM

## 2015-11-29 DIAGNOSIS — F410 Panic disorder [episodic paroxysmal anxiety] without agoraphobia: Secondary | ICD-10-CM | POA: Insufficient documentation

## 2015-11-29 DIAGNOSIS — F909 Attention-deficit hyperactivity disorder, unspecified type: Secondary | ICD-10-CM | POA: Insufficient documentation

## 2015-11-29 LAB — COMPREHENSIVE METABOLIC PANEL
ALT: 23 U/L (ref 14–54)
AST: 32 U/L (ref 15–41)
Albumin: 4.5 g/dL (ref 3.5–5.0)
Alkaline Phosphatase: 110 U/L (ref 38–126)
Anion gap: 14 (ref 5–15)
BUN: 22 mg/dL — ABNORMAL HIGH (ref 6–20)
CHLORIDE: 100 mmol/L — AB (ref 101–111)
CO2: 22 mmol/L (ref 22–32)
Calcium: 9 mg/dL (ref 8.9–10.3)
Creatinine, Ser: 0.86 mg/dL (ref 0.44–1.00)
Glucose, Bld: 115 mg/dL — ABNORMAL HIGH (ref 65–99)
POTASSIUM: 3.2 mmol/L — AB (ref 3.5–5.1)
SODIUM: 136 mmol/L (ref 135–145)
Total Bilirubin: 1.8 mg/dL — ABNORMAL HIGH (ref 0.3–1.2)
Total Protein: 7.6 g/dL (ref 6.5–8.1)

## 2015-11-29 LAB — CBC WITH DIFFERENTIAL/PLATELET
BASOS ABS: 0 10*3/uL (ref 0.0–0.1)
Basophils Relative: 0 %
EOS ABS: 0.2 10*3/uL (ref 0.0–0.7)
EOS PCT: 1 %
HCT: 44.6 % (ref 36.0–46.0)
Hemoglobin: 15.4 g/dL — ABNORMAL HIGH (ref 12.0–15.0)
LYMPHS PCT: 16 %
Lymphs Abs: 1.8 10*3/uL (ref 0.7–4.0)
MCH: 32.5 pg (ref 26.0–34.0)
MCHC: 34.5 g/dL (ref 30.0–36.0)
MCV: 94.1 fL (ref 78.0–100.0)
MONO ABS: 0.8 10*3/uL (ref 0.1–1.0)
Monocytes Relative: 7 %
Neutro Abs: 9 10*3/uL — ABNORMAL HIGH (ref 1.7–7.7)
Neutrophils Relative %: 76 %
PLATELETS: 250 10*3/uL (ref 150–400)
RBC: 4.74 MIL/uL (ref 3.87–5.11)
RDW: 14.9 % (ref 11.5–15.5)
WBC: 11.7 10*3/uL — AB (ref 4.0–10.5)

## 2015-11-29 LAB — PREGNANCY, URINE: PREG TEST UR: NEGATIVE

## 2015-11-29 LAB — URINE MICROSCOPIC-ADD ON

## 2015-11-29 LAB — LIPASE, BLOOD: LIPASE: 311 U/L — AB (ref 11–51)

## 2015-11-29 LAB — OCCULT BLOOD X 1 CARD TO LAB, STOOL: Fecal Occult Bld: NEGATIVE

## 2015-11-29 LAB — URINALYSIS, ROUTINE W REFLEX MICROSCOPIC
Bilirubin Urine: NEGATIVE
GLUCOSE, UA: NEGATIVE mg/dL
Ketones, ur: NEGATIVE mg/dL
LEUKOCYTES UA: NEGATIVE
Nitrite: NEGATIVE
PH: 6 (ref 5.0–8.0)
PROTEIN: 30 mg/dL — AB
Specific Gravity, Urine: 1.027 (ref 1.005–1.030)

## 2015-11-29 MED ORDER — ONDANSETRON HCL 4 MG/2ML IJ SOLN
4.0000 mg | Freq: Once | INTRAMUSCULAR | Status: AC
  Administered 2015-11-29: 4 mg via INTRAVENOUS
  Filled 2015-11-29: qty 2

## 2015-11-29 MED ORDER — SODIUM CHLORIDE 0.9 % IV SOLN
1000.0000 mL | Freq: Once | INTRAVENOUS | Status: AC
  Administered 2015-11-29: 1000 mL via INTRAVENOUS

## 2015-11-29 MED ORDER — METOCLOPRAMIDE HCL 10 MG PO TABS: 10.0000 mg | ORAL_TABLET | Freq: Four times a day (QID) | ORAL | Status: DC

## 2015-11-29 MED ORDER — METOCLOPRAMIDE HCL 5 MG/ML IJ SOLN
10.0000 mg | Freq: Once | INTRAMUSCULAR | Status: AC
  Administered 2015-11-29: 10 mg via INTRAVENOUS
  Filled 2015-11-29: qty 2

## 2015-11-29 MED ORDER — POTASSIUM CHLORIDE CRYS ER 20 MEQ PO TBCR: 20.0000 meq | EXTENDED_RELEASE_TABLET | Freq: Two times a day (BID) | ORAL | Status: DC

## 2015-11-29 MED ORDER — SODIUM CHLORIDE 0.9 % IV SOLN
80.0000 mg | Freq: Once | INTRAVENOUS | Status: AC
  Administered 2015-11-29: 80 mg via INTRAVENOUS

## 2015-11-29 MED ORDER — PANTOPRAZOLE SODIUM 40 MG IV SOLR
INTRAVENOUS | Status: AC
  Filled 2015-11-29: qty 80

## 2015-11-29 MED ORDER — SODIUM CHLORIDE 0.9 % IV SOLN: 1000.0000 mL | Freq: Once | INTRAVENOUS | Status: DC

## 2015-11-29 MED ORDER — POTASSIUM CHLORIDE CRYS ER 20 MEQ PO TBCR
40.0000 meq | EXTENDED_RELEASE_TABLET | Freq: Once | ORAL | Status: AC
  Administered 2015-11-29: 40 meq via ORAL
  Filled 2015-11-29: qty 2

## 2015-11-29 MED ORDER — SODIUM CHLORIDE 0.9 % IV SOLN: 1000.0000 mL | INTRAVENOUS | Status: DC

## 2015-11-29 MED ORDER — PANTOPRAZOLE SODIUM 20 MG PO TBEC: 20.0000 mg | DELAYED_RELEASE_TABLET | Freq: Every day | ORAL | Status: DC

## 2015-11-29 NOTE — Discharge Instructions (Signed)
Acute Pancreatitis °Acute pancreatitis is a disease in which the pancreas becomes suddenly inflamed. The pancreas is a large gland located behind your stomach. The pancreas produces enzymes that help digest food. The pancreas also releases the hormones glucagon and insulin that help regulate blood sugar. Damage to the pancreas occurs when the digestive enzymes from the pancreas are activated and begin attacking the pancreas before being released into the intestine. Most acute attacks last a couple of days and can cause serious complications. Some people become dehydrated and develop low blood pressure. In severe cases, bleeding into the pancreas can lead to shock and can be life-threatening. The lungs, heart, and kidneys may fail. °CAUSES  °Pancreatitis can happen to anyone. In some cases, the cause is unknown. Most cases are caused by: °· Alcohol abuse. °· Gallstones. °Other less common causes are: °· Certain medicines. °· Exposure to certain chemicals. °· Infection. °· Damage caused by an accident (trauma). °· Abdominal surgery. °SYMPTOMS  °· Pain in the upper abdomen that may radiate to the back. °· Tenderness and swelling of the abdomen. °· Nausea and vomiting. °DIAGNOSIS  °Your caregiver will perform a physical exam. Blood and stool tests may be done to confirm the diagnosis. Imaging tests may also be done, such as X-rays, CT scans, or an ultrasound of the abdomen. °TREATMENT  °Treatment usually requires a stay in the hospital. Treatment may include: °· Pain medicine. °· Fluid replacement through an intravenous line (IV). °· Placing a tube in the stomach to remove stomach contents and control vomiting. °· Not eating for 3 or 4 days. This gives your pancreas a rest, because enzymes are not being produced that can cause further damage. °· Antibiotic medicines if your condition is caused by an infection. °· Surgery of the pancreas or gallbladder. °HOME CARE INSTRUCTIONS  °· Follow the diet advised by your  caregiver. This may involve avoiding alcohol and decreasing the amount of fat in your diet. °· Eat smaller, more frequent meals. This reduces the amount of digestive juices the pancreas produces. °· Drink enough fluids to keep your urine clear or pale yellow. °· Only take over-the-counter or prescription medicines as directed by your caregiver. °· Avoid drinking alcohol if it caused your condition. °· Do not smoke. °· Get plenty of rest. °· Check your blood sugar at home as directed by your caregiver. °· Keep all follow-up appointments as directed by your caregiver. °SEEK MEDICAL CARE IF:  °· You do not recover as quickly as expected. °· You develop new or worsening symptoms. °· You have persistent pain, weakness, or nausea. °· You recover and then have another episode of pain. °SEEK IMMEDIATE MEDICAL CARE IF:  °· You are unable to eat or keep fluids down. °· Your pain becomes severe. °· You have a fever or persistent symptoms for more than 2 to 3 days. °· You have a fever and your symptoms suddenly get worse. °· Your skin or the white part of your eyes turn yellow (jaundice). °· You develop vomiting. °· You feel dizzy, or you faint. °· Your blood sugar is high (over 300 mg/dL). °MAKE SURE YOU:  °· Understand these instructions. °· Will watch your condition. °· Will get help right away if you are not doing well or get worse. °  °This information is not intended to replace advice given to you by your health care provider. Make sure you discuss any questions you have with your health care provider. °  °Document Released: 12/13/2005 Document Revised: 06/13/2012   Document Reviewed: 03/23/2012 °Elsevier Interactive Patient Education ©2016 [employer] ° ° °Alcohol Use Disorder °Alcohol use disorder is a mental disorder. It is not a one-time incident of heavy drinking. Alcohol use disorder is the excessive and uncontrollable use of alcohol over time that leads to problems with functioning in one or more areas of daily  living. People with this disorder risk harming themselves and others when they drink to excess. Alcohol use disorder also can cause other mental disorders, such as mood and anxiety disorders, and serious physical problems. People with alcohol use disorder often misuse other drugs.  °Alcohol use disorder is common and widespread. Some people with this disorder drink alcohol to cope with or escape from negative life events. Others drink to relieve chronic pain or symptoms of mental illness. People with a family history of alcohol use disorder are at higher risk of losing control and using alcohol to excess.  °Drinking too much alcohol can cause injury, accidents, and health problems. One drink can be too much when you are: °· Working. °· Pregnant or breastfeeding. °· Taking medicines. Ask your doctor. °· Driving or planning to drive. °SYMPTOMS  °Signs and symptoms of alcohol use disorder may include the following:  °· Consumption of alcohol in larger amounts or over a longer period of time than intended. °· Multiple unsuccessful attempts to cut down or control alcohol use.   °· A great deal of time spent obtaining alcohol, using alcohol, or recovering from the effects of alcohol (hangover). °· A strong desire or urge to use alcohol (cravings).   °· Continued use of alcohol despite problems at work, school, or home because of alcohol use.   °· Continued use of alcohol despite problems in relationships because of alcohol use. °· Continued use of alcohol in situations when it is physically hazardous, such as driving a car. °· Continued use of alcohol despite awareness of a physical or psychological problem that is likely related to alcohol use. Physical problems related to alcohol use can involve the brain, heart, liver, stomach, and intestines. Psychological problems related to alcohol use include intoxication, depression, anxiety, psychosis, delirium, and dementia.   °· The need for increased amounts of alcohol to  achieve the same desired effect, or a decreased effect from the consumption of the same amount of alcohol (tolerance). °· Withdrawal symptoms upon reducing or stopping alcohol use, or alcohol use to reduce or avoid withdrawal symptoms. Withdrawal symptoms include: °¨ Racing heart. °¨ Hand tremor. °¨ Difficulty sleeping. °¨ Nausea. °¨ Vomiting. °¨ Hallucinations. °¨ Restlessness. °¨ Seizures. °DIAGNOSIS °Alcohol use disorder is diagnosed through an assessment by your health care provider. Your health care provider may start by asking three or four questions to screen for excessive or problematic alcohol use. To confirm a diagnosis of alcohol use disorder, at least two symptoms must be present within a 12-month period. The severity of alcohol use disorder depends on the number of symptoms: °· Mild--two or three. °· Moderate--four or five. °· Severe--six or more. °Your health care provider may perform a physical exam or use results from lab tests to see if you have physical problems resulting from alcohol use. Your health care provider may refer you to a mental health professional for evaluation. °TREATMENT  °Some people with alcohol use disorder are able to reduce their alcohol use to low-risk levels. Some people with alcohol use disorder need to quit drinking alcohol. When necessary, mental health professionals with specialized training in substance use treatment can help. Your health care provider can help you decide how   severe your alcohol use disorder is and what type of treatment you need. The following forms of treatment are available:  °· Detoxification. Detoxification involves the use of prescription medicines to prevent alcohol withdrawal symptoms in the first week after quitting. This is important for people with a history of symptoms of withdrawal and for heavy drinkers who are likely to have withdrawal symptoms. Alcohol withdrawal can be dangerous and, in severe cases, cause death. Detoxification is  usually provided in a hospital or in-patient substance use treatment facility. °· Counseling or talk therapy. Talk therapy is provided by substance use treatment counselors. It addresses the reasons people use alcohol and ways to keep them from drinking again. The goals of talk therapy are to help people with alcohol use disorder find healthy activities and ways to cope with life stress, to identify and avoid triggers for alcohol use, and to handle cravings, which can cause relapse. °· Medicines. Different medicines can help treat alcohol use disorder through the following actions: °¨ Decrease alcohol cravings. °¨ Decrease the positive reward response felt from alcohol use. °¨ Produce an uncomfortable physical reaction when alcohol is used (aversion therapy). °· Support groups. Support groups are run by people who have quit drinking. They provide emotional support, advice, and guidance. °These forms of treatment are often combined. Some people with alcohol use disorder benefit from intensive combination treatment provided by specialized substance use treatment centers. Both inpatient and outpatient treatment programs are available. °  °This information is not intended to replace advice given to you by your health care provider. Make sure you discuss any questions you have with your health care provider. °  °Document Released: 01/20/2005 Document Revised: 01/03/2015 Document Reviewed: 03/22/2013 °Elsevier Interactive Patient Education ©2016 [employer] ° °

## 2015-11-29 NOTE — ED Notes (Addendum)
Patient stated experiencing mid upper abd pain Thursday night and intermittent vointing. No vomiting this morning but continues to have abd pain. 10/31 had an episode of pancreatitis and this feels similar. Last drink of alcohol Thursday morning

## 2015-11-29 NOTE — ED Provider Notes (Signed)
CSN: [id number]     Arrival date & time 11/29/15  4098 History   First MD Initiated Contact with Patient 11/29/15 [id number]     Chief Complaint  Patient presents with  . Abdominal Pain     (Consider location/radiation/quality/duration/timing/severity/associated sxs/prior Treatment) HPI  54 year old female history of alcohol abuse comes in today complaining of epigastric pain began Thursday night. She has been drinking a pint of liquor a day. She says that this did occur after drinking alcohol. The pain is in the upper abdomen is similar to pain she presented with on 1031. She vomited repeatedly. She denies any hematemesis. She denies any blood per rectum. She has been taking in some fluids. Pain is moderate to severe. She denies any lightheadedness, chest pain, fever, chills, UTI symptoms or symptoms consistent with GI bleeding.  Past Medical History  Diagnosis Date  . Bipolar disorder (HCC)   . Anxiety   . Panic attack   . Chronic pain   . OCD (obsessive compulsive disorder)   . ADHD (attention deficit hyperactivity disorder)   . Arthritis   . Hypertension   . Depression   . Fracture of lateral malleolus of right fibula   . Syncope    Past Surgical History  Procedure Laterality Date  . Myringotomy    . Tonsillectomy    . Rhinoplasty    . Abdominal hysterectomy    . Anterior and posterior repair    . Finfger surgery    . Finger surgery    . Bunionectomy Bilateral   . Orif ankle fracture Right 07/25/2015    Procedure: OPEN REDUCTION INTERNAL FIXATION (ORIF) RIGHT ANKLE ;  Surgeon: Sheral Apley, MD;  [facility];  Service: Orthopedics;  Laterality: Right;   No family history on file. Social History  Substance Use Topics  . Smoking status: Current Every Day Smoker -- 1.00 packs/day    Types: Cigarettes  . Smokeless tobacco: Never Used  . Alcohol Use: Yes     Comment: 1x week   OB History    No data available     Review of Systems  All other  systems reviewed and are negative.     Allergies  Codeine and Demerol  Home Medications   Prior to Admission medications   Medication Sig Start Date End Date Taking? Authorizing Provider  ALPRAZolam (XANAX PO) Take 0.25 mg by mouth.     Historical Provider, MD  Amphetamine-Dextroamphetamine (ADDERALL PO) Take 20 mg by mouth.     Historical Provider, MD  aspirin EC 81 MG tablet Take 81 mg by mouth daily.    Historical Provider, MD  atorvastatin (LIPITOR) 40 MG tablet Take 40 mg by mouth daily.    Historical Provider, MD  clonazePAM (KLONOPIN) 1 MG tablet Take 1 mg by mouth Nightly.     Historical Provider, MD  losartan (COZAAR) 100 MG tablet Take 100 mg by mouth daily.  06/12/14   Historical Provider, MD  omeprazole (PRILOSEC) 20 MG capsule Take 1 capsule (20 mg total) by mouth daily. 10/27/15   Joycie Peek, PA-C  Sertraline HCl (ZOLOFT PO) Take 100 mg by mouth daily.     Historical Provider, MD  traMADol (ULTRAM) 50 MG tablet Take 100 mg by mouth every 6 (six) hours as needed.    Historical Provider, MD  traZODone (DESYREL) 150 MG tablet Take 150 mg by mouth at bedtime as needed for sleep.     Historical Provider, MD   BP 136/88 mmHg  Pulse 98  Temp(Src) 98.1 F (36.7 C) (Oral)  Resp 20  Ht  (1.626 m)  Wt 90.719 kg  BMI 34.31 kg/m2  SpO2 98% Physical Exam  Constitutional: She is oriented to person, place, and time. She appears well-developed and well-nourished.  HENT:  Head: Normocephalic and atraumatic.  Right Ear: External ear normal.  Left Ear: External ear normal.  Nose: Nose normal.  Mouth/Throat: Oropharynx is clear and moist.  Eyes: Conjunctivae and EOM are normal. Pupils are equal, round, and reactive to light.  Neck: Normal range of motion. Neck supple. No JVD present. No tracheal deviation present. No thyromegaly present.  Cardiovascular: Normal rate, regular rhythm, normal heart sounds and intact distal pulses.   Pulmonary/Chest: Effort normal and breath  sounds normal. She has no wheezes.  Abdominal: Soft. Bowel sounds are normal. She exhibits no distension and no mass. There is tenderness. There is no rebound and no guarding.    Musculoskeletal: She exhibits no tenderness.  Lymphadenopathy:    She has no cervical adenopathy.  Neurological: She is alert and oriented to person, place, and time. She has normal reflexes. No cranial nerve deficit or sensory deficit. Gait normal. GCS eye subscore is 4. GCS verbal subscore is 5. GCS motor subscore is 6.  Reflex Scores:      Bicep reflexes are 2+ on the right side and 2+ on the left side.      Patellar reflexes are 2+ on the right side and 2+ on the left side. Strength is normal and equal throughout. Cranial nerves grossly intact. Patient fluent. No gross ataxia and patient able to ambulate without difficulty.  Skin: Skin is warm and dry.  Psychiatric: She has a normal mood and affect. Her behavior is normal. Judgment and thought content normal.  Nursing note and vitals reviewed.   ED Course  Procedures (including critical care time) Labs Review Labs Reviewed  URINALYSIS, ROUTINE W REFLEX MICROSCOPIC (NOT AT [facility]) - Abnormal; Notable for the following:    Color, Urine AMBER (*)    Hgb urine dipstick SMALL (*)    Protein, ur 30 (*)    All other components within normal limits  CBC WITH DIFFERENTIAL/PLATELET - Abnormal; Notable for the following:    WBC 11.7 (*)    Hemoglobin 15.4 (*)    Neutro Abs 9.0 (*)    All other components within normal limits  COMPREHENSIVE METABOLIC PANEL - Abnormal; Notable for the following:    Potassium 3.2 (*)    Chloride 100 (*)    Glucose, Bld 115 (*)    BUN 22 (*)    Total Bilirubin 1.8 (*)    All other components within normal limits  LIPASE, BLOOD - Abnormal; Notable for the following:    Lipase 311 (*)    All other components within normal limits  URINE MICROSCOPIC-ADD ON - Abnormal; Notable for the following:    Squamous Epithelial / LPF 0-5  (*)    Bacteria, UA FEW (*)    Casts HYALINE CASTS (*)    All other components within normal limits  OCCULT BLOOD X 1 CARD TO LAB, STOOL  PREGNANCY, URINE    Imaging Review No results found. I have personally reviewed and evaluated these images and lab results as part of my medical decision-making.   EKG Interpretation None      MDM   Final diagnoses:  Other acute pancreatitis  Alcohol abuse  Hypokalemia   54 year old female with alcohol abuse who presents with pancreatitis.  She has been tolerating fluids here and has had IV hydration. She has not any vomiting while here in the emergency department. Pain is moderate. Patient with mild hypokalemia potassium 3.2 which is being orally repleted. I discussed with her that the main treatment for this is avoidance of alcohol. States she has all the alcohol in her house. She denies having withdrawal symptoms in the past from alcohol. She'll be placed on Protonix, Reglan, and Zofran. She is advised regarding clear liquids for the next 24-40 hours and then gradual advancement of diet. She is advised regarding return precautions and voices understanding. She follows at [facility] will follow up there if she is improving. She is not improving she'll return.     Margarita Grizzleanielle Cambryn Charters, MD 11/30/15 [phone]

## 2015-11-30 ENCOUNTER — Telehealth (HOSPITAL_COMMUNITY): Payer: Self-pay

## 2016-07-08 ENCOUNTER — Inpatient Hospital Stay (HOSPITAL_BASED_OUTPATIENT_CLINIC_OR_DEPARTMENT_OTHER)
Admission: EM | Admit: 2016-07-08 | Discharge: 2016-07-11 | DRG: 439 | Disposition: A | Discharge status: Home / Self Care | Payer: 59 | Attending: Internal Medicine | Admitting: Internal Medicine

## 2016-07-08 ENCOUNTER — Encounter (HOSPITAL_BASED_OUTPATIENT_CLINIC_OR_DEPARTMENT_OTHER): Payer: Self-pay | Admitting: *Deleted

## 2016-07-08 DIAGNOSIS — K858 Other acute pancreatitis without necrosis or infection: Secondary | ICD-10-CM

## 2016-07-08 DIAGNOSIS — F10239 Alcohol dependence with withdrawal, unspecified: Secondary | ICD-10-CM | POA: Diagnosis present

## 2016-07-08 DIAGNOSIS — M549 Dorsalgia, unspecified: Secondary | ICD-10-CM | POA: Diagnosis present

## 2016-07-08 DIAGNOSIS — Z7982 Long term (current) use of aspirin: Secondary | ICD-10-CM

## 2016-07-08 DIAGNOSIS — F410 Panic disorder [episodic paroxysmal anxiety] without agoraphobia: Secondary | ICD-10-CM | POA: Diagnosis present

## 2016-07-08 DIAGNOSIS — K860 Alcohol-induced chronic pancreatitis: Secondary | ICD-10-CM | POA: Diagnosis not present

## 2016-07-08 DIAGNOSIS — F10939 Alcohol use, unspecified with withdrawal, unspecified: Secondary | ICD-10-CM | POA: Diagnosis present

## 2016-07-08 DIAGNOSIS — Y900 Blood alcohol level of less than 20 mg/100 ml: Secondary | ICD-10-CM | POA: Diagnosis present

## 2016-07-08 DIAGNOSIS — K852 Alcohol induced acute pancreatitis without necrosis or infection: Principal | ICD-10-CM | POA: Diagnosis present

## 2016-07-08 DIAGNOSIS — Z888 Allergy status to other drugs, medicaments and biological substances status: Secondary | ICD-10-CM

## 2016-07-08 DIAGNOSIS — F4310 Post-traumatic stress disorder, unspecified: Secondary | ICD-10-CM | POA: Diagnosis present

## 2016-07-08 DIAGNOSIS — I10 Essential (primary) hypertension: Secondary | ICD-10-CM | POA: Diagnosis present

## 2016-07-08 DIAGNOSIS — K859 Acute pancreatitis without necrosis or infection, unspecified: Secondary | ICD-10-CM | POA: Diagnosis present

## 2016-07-08 DIAGNOSIS — F909 Attention-deficit hyperactivity disorder, unspecified type: Secondary | ICD-10-CM | POA: Diagnosis present

## 2016-07-08 DIAGNOSIS — R1013 Epigastric pain: Secondary | ICD-10-CM | POA: Diagnosis not present

## 2016-07-08 DIAGNOSIS — Z885 Allergy status to narcotic agent status: Secondary | ICD-10-CM

## 2016-07-08 DIAGNOSIS — G8929 Other chronic pain: Secondary | ICD-10-CM | POA: Diagnosis present

## 2016-07-08 DIAGNOSIS — F429 Obsessive-compulsive disorder, unspecified: Secondary | ICD-10-CM | POA: Diagnosis present

## 2016-07-08 DIAGNOSIS — F17210 Nicotine dependence, cigarettes, uncomplicated: Secondary | ICD-10-CM | POA: Diagnosis present

## 2016-07-08 DIAGNOSIS — F319 Bipolar disorder, unspecified: Secondary | ICD-10-CM | POA: Diagnosis present

## 2016-07-08 DIAGNOSIS — Z79899 Other long term (current) drug therapy: Secondary | ICD-10-CM

## 2016-07-08 HISTORY — DX: Post-traumatic stress disorder, unspecified: F43.10

## 2016-07-08 HISTORY — DX: Alcohol induced acute pancreatitis without necrosis or infection: K85.20

## 2016-07-08 LAB — CBC WITH DIFFERENTIAL/PLATELET
BASOS ABS: 0 10*3/uL (ref 0.0–0.1)
Basophils Relative: 0 %
EOS PCT: 1 %
Eosinophils Absolute: 0.1 10*3/uL (ref 0.0–0.7)
HEMATOCRIT: 44 % (ref 36.0–46.0)
Hemoglobin: 15.5 g/dL — ABNORMAL HIGH (ref 12.0–15.0)
LYMPHS ABS: 1 10*3/uL (ref 0.7–4.0)
LYMPHS PCT: 10 %
MCH: 34.5 pg — AB (ref 26.0–34.0)
MCHC: 35.2 g/dL (ref 30.0–36.0)
MCV: 98 fL (ref 78.0–100.0)
MONO ABS: 0.6 10*3/uL (ref 0.1–1.0)
MONOS PCT: 6 %
NEUTROS ABS: 9 10*3/uL — AB (ref 1.7–7.7)
Neutrophils Relative %: 83 %
PLATELETS: 239 10*3/uL (ref 150–400)
RBC: 4.49 MIL/uL (ref 3.87–5.11)
RDW: 13.8 % (ref 11.5–15.5)
WBC: 10.7 10*3/uL — ABNORMAL HIGH (ref 4.0–10.5)

## 2016-07-08 LAB — COMPREHENSIVE METABOLIC PANEL
ALK PHOS: 115 U/L (ref 38–126)
ALT: 48 U/L (ref 14–54)
AST: 52 U/L — AB (ref 15–41)
Albumin: 4.7 g/dL (ref 3.5–5.0)
Anion gap: 13 (ref 5–15)
BILIRUBIN TOTAL: 1.3 mg/dL — AB (ref 0.3–1.2)
BUN: 24 mg/dL — AB (ref 6–20)
CALCIUM: 9.4 mg/dL (ref 8.9–10.3)
CO2: 23 mmol/L (ref 22–32)
CREATININE: 0.85 mg/dL (ref 0.44–1.00)
Chloride: 99 mmol/L — ABNORMAL LOW (ref 101–111)
GFR calc Af Amer: >60 mL/min (ref >60)
Glucose, Bld: 132 mg/dL — ABNORMAL HIGH (ref 65–99)
POTASSIUM: 3.8 mmol/L (ref 3.5–5.1)
Sodium: 135 mmol/L (ref 135–145)
Total Protein: 7.8 g/dL (ref 6.5–8.1)

## 2016-07-08 LAB — LIPASE, BLOOD: Lipase: 682 U/L — ABNORMAL HIGH (ref 11–51)

## 2016-07-08 LAB — ETHANOL: Alcohol, Ethyl (B): <5 mg/dL (ref <5)

## 2016-07-08 MED ORDER — LACTATED RINGERS IV SOLN
INTRAVENOUS | Status: DC
  Administered 2016-07-08 – 2016-07-09 (×3): via INTRAVENOUS

## 2016-07-08 MED ORDER — MORPHINE SULFATE (PF) 2 MG/ML IV SOLN
2.0000 mg | INTRAVENOUS | Status: DC | PRN
  Administered 2016-07-08 – 2016-07-09 (×6): 2 mg via INTRAVENOUS
  Filled 2016-07-08 (×6): qty 1

## 2016-07-08 MED ORDER — LOSARTAN POTASSIUM 50 MG PO TABS
100.0000 mg | ORAL_TABLET | Freq: Every day | ORAL | Status: DC
  Administered 2016-07-09 – 2016-07-11 (×3): 100 mg via ORAL
  Filled 2016-07-08 (×3): qty 2

## 2016-07-08 MED ORDER — MORPHINE SULFATE (PF) 4 MG/ML IV SOLN
4.0000 mg | Freq: Once | INTRAVENOUS | Status: AC
  Administered 2016-07-08: 4 mg via INTRAVENOUS
  Filled 2016-07-08: qty 1

## 2016-07-08 MED ORDER — MORPHINE SULFATE (PF) 4 MG/ML IV SOLN: 4.0000 mg | Freq: Once | INTRAVENOUS | Status: DC

## 2016-07-08 MED ORDER — TRAZODONE HCL 50 MG PO TABS
150.0000 mg | ORAL_TABLET | Freq: Every evening | ORAL | Status: DC | PRN
  Administered 2016-07-09: 150 mg via ORAL
  Filled 2016-07-08: qty 1

## 2016-07-08 MED ORDER — THIAMINE HCL 100 MG/ML IJ SOLN
Freq: Once | INTRAVENOUS | Status: AC
  Administered 2016-07-08: 21:00:00 via INTRAVENOUS
  Filled 2016-07-08: qty 1000

## 2016-07-08 MED ORDER — ONDANSETRON HCL 4 MG/2ML IJ SOLN
4.0000 mg | Freq: Once | INTRAMUSCULAR | Status: AC
  Administered 2016-07-08: 4 mg via INTRAVENOUS
  Filled 2016-07-08: qty 2

## 2016-07-08 MED ORDER — ACETAMINOPHEN 325 MG PO TABS
650.0000 mg | ORAL_TABLET | Freq: Four times a day (QID) | ORAL | Status: DC | PRN
  Administered 2016-07-11: 650 mg via ORAL
  Filled 2016-07-08: qty 2

## 2016-07-08 MED ORDER — LORAZEPAM 2 MG/ML IJ SOLN
0.0000 mg | Freq: Four times a day (QID) | INTRAMUSCULAR | Status: AC
  Administered 2016-07-08: 4 mg via INTRAVENOUS
  Administered 2016-07-09 – 2016-07-10 (×2): 2 mg via INTRAVENOUS
  Filled 2016-07-08: qty 1
  Filled 2016-07-08: qty 2
  Filled 2016-07-08: qty 1
  Filled 2016-07-08: qty 2

## 2016-07-08 MED ORDER — LORAZEPAM 2 MG/ML IJ SOLN
0.5000 mg | Freq: Once | INTRAMUSCULAR | Status: AC
  Administered 2016-07-08: 0.5 mg via INTRAVENOUS
  Filled 2016-07-08: qty 1

## 2016-07-08 MED ORDER — ONDANSETRON HCL 4 MG PO TABS: 4.0000 mg | ORAL_TABLET | Freq: Four times a day (QID) | ORAL | Status: DC | PRN

## 2016-07-08 MED ORDER — ONDANSETRON HCL 4 MG/2ML IJ SOLN
4.0000 mg | Freq: Four times a day (QID) | INTRAMUSCULAR | Status: DC | PRN
Start: 2016-07-08 — End: 2016-07-11
  Administered 2016-07-08 – 2016-07-11 (×3): 4 mg via INTRAVENOUS
  Filled 2016-07-08 (×3): qty 2

## 2016-07-08 MED ORDER — SODIUM CHLORIDE 0.9 % IV BOLUS (SEPSIS)
1000.0000 mL | Freq: Once | INTRAVENOUS | Status: AC
  Administered 2016-07-08: 1000 mL via INTRAVENOUS

## 2016-07-08 MED ORDER — ENOXAPARIN SODIUM 40 MG/0.4ML ~~LOC~~ SOLN
40.0000 mg | SUBCUTANEOUS | Status: DC
Start: 2016-07-08 — End: 2016-07-11
  Administered 2016-07-08 – 2016-07-10 (×3): 40 mg via SUBCUTANEOUS
  Filled 2016-07-08 (×3): qty 0.4

## 2016-07-08 MED ORDER — LORAZEPAM 2 MG/ML IJ SOLN
0.0000 mg | Freq: Two times a day (BID) | INTRAMUSCULAR | Status: DC
  Administered 2016-07-10: 2 mg via INTRAVENOUS
  Administered 2016-07-11: 1 mg via INTRAVENOUS
  Filled 2016-07-08 (×2): qty 1

## 2016-07-08 MED ORDER — ACETAMINOPHEN 650 MG RE SUPP: 650.0000 mg | Freq: Four times a day (QID) | RECTAL | Status: DC | PRN

## 2016-07-08 MED ORDER — SERTRALINE HCL 100 MG PO TABS
100.0000 mg | ORAL_TABLET | Freq: Every day | ORAL | Status: DC
  Administered 2016-07-09 – 2016-07-11 (×3): 100 mg via ORAL
  Filled 2016-07-08 (×3): qty 1

## 2016-07-08 NOTE — ED Notes (Signed)
Binge drinking x 6 days. States she thinks she has pancreatitis.

## 2016-07-08 NOTE — ED Notes (Signed)
Pt called out requesting more pain meds, stating "that morphine don't work you might as well tell them to just take that off my med list" despite the fact that she has reported decreased pain with each dosing.  Reports "i'm getting shaky and need something for the ride, i can't go in an ambulance like this".  Previously when RN walked in room pt was sleeping.

## 2016-07-08 NOTE — ED Notes (Signed)
[facility] aware [facility] bed at [facility]--will wait on [facility] to transport.

## 2016-07-08 NOTE — ED Provider Notes (Signed)
CSN: [id number]    [id number] Arrival date & time 07/08/16  1147 History   First MD Initiated Contact with Patient 07/08/16 1207     Chief Complaint  Patient presents with  . Abdominal Pain     (Consider location/radiation/quality/duration/timing/severity/associated sxs/prior Treatment) HPI   Patient is a 55 year old female with history of bipolar and anxiety presenting today with alcohol withdrawal. Patient reports that she has been on a "bender" for the last week and half due to stress with her father. She reports that she quit drinking yesterday morning. Since then she's been having vomiting and shakiness. In addition she's had abdominal pain that  is sharp in nature and epigastric. Patient has history of pancreatitis.  She reports nausea and vomiting. No diarrhea. No fevers.  Past Medical History  Diagnosis Date  . Bipolar disorder (HCC)   . Anxiety   . Panic attack   . Chronic pain   . OCD (obsessive compulsive disorder)   . ADHD (attention deficit hyperactivity disorder)   . Arthritis   . Hypertension   . Depression   . Fracture of lateral malleolus of right fibula   . Syncope    Past Surgical History  Procedure Laterality Date  . Myringotomy    . Tonsillectomy    . Rhinoplasty    . Abdominal hysterectomy    . Anterior and posterior repair    . Finfger surgery    . Finger surgery    . Bunionectomy Bilateral   . Orif ankle fracture Right 07/25/2015    Procedure: OPEN REDUCTION INTERNAL FIXATION (ORIF) RIGHT ANKLE ;  Surgeon: Sheral Apleyimothy D Murphy, MD;  Location: [facility];  Service: Orthopedics;  Laterality: Right;   No family history on file. Social History  Substance Use Topics  . Smoking status: Current Every Day Smoker -- 1.00 packs/day    Types: Cigarettes  . Smokeless tobacco: Never Used  . Alcohol Use: Yes     Comment: 1x week   OB History    No data available     Review of Systems  Constitutional: Negative for fever, activity change and fatigue.   Respiratory: Negative for shortness of breath.   Cardiovascular: Negative for chest pain.  Gastrointestinal: Positive for nausea, vomiting and abdominal pain.  Genitourinary: Negative for dysuria.  Musculoskeletal: Negative for back pain.  Neurological: Negative for dizziness.  Psychiatric/Behavioral: Negative for behavioral problems and agitation.      Allergies  Codeine and Demerol  Home Medications   Prior to Admission medications   Medication Sig Start Date End Date Taking? Authorizing Provider  ALPRAZolam (XANAX PO) Take 0.25 mg by mouth.    Yes Historical Provider, MD  Amphetamine-Dextroamphetamine (ADDERALL PO) Take 20 mg by mouth.    Yes Historical Provider, MD  aspirin EC 81 MG tablet Take 81 mg by mouth daily.   Yes Historical Provider, MD  losartan (COZAAR) 100 MG tablet Take 100 mg by mouth daily.  06/12/14  Yes Historical Provider, MD  Sertraline HCl (ZOLOFT PO) Take 100 mg by mouth daily.    Yes Historical Provider, MD  traZODone (DESYREL) 150 MG tablet Take 150 mg by mouth at bedtime as needed for sleep.    Yes Historical Provider, MD  atorvastatin (LIPITOR) 40 MG tablet Take 40 mg by mouth daily.    Historical Provider, MD  clonazePAM (KLONOPIN) 1 MG tablet Take 1 mg by mouth Nightly.     Historical Provider, MD  metoCLOPramide (REGLAN) 10 MG tablet Take 1 tablet (10  mg total) by mouth every 6 (six) hours. 11/29/15   Margarita Grizzle, MD  omeprazole (PRILOSEC) 20 MG capsule Take 1 capsule (20 mg total) by mouth daily. 10/27/15   Joycie Peek, PA-C  pantoprazole (PROTONIX) 20 MG tablet Take 1 tablet (20 mg total) by mouth daily. 11/29/15   Margarita Grizzle, MD  potassium chloride SA (K-DUR,KLOR-CON) 20 MEQ tablet Take 1 tablet (20 mEq total) by mouth 2 (two) times daily. 11/29/15   Margarita Grizzle, MD  traMADol (ULTRAM) 50 MG tablet Take 100 mg by mouth every 6 (six) hours as needed.    Historical Provider, MD   BP 138/99 mmHg  Pulse 96  Temp(Src) 97.6 F (36.4 C) (Oral)   Resp 16  Ht  (1.626 m)  Wt 185 lb (83.915 kg)  BMI 31.74 kg/m2  SpO2 95% Physical Exam  Constitutional: She is oriented to person, place, and time. She appears well-developed and well-nourished.  Patient shaky.  HENT:  Head: Normocephalic and atraumatic.  Eyes: Conjunctivae are normal. Right eye exhibits no discharge.  Neck: Neck supple.  Cardiovascular: Normal rate, regular rhythm and normal heart sounds.   No murmur heard. Pulmonary/Chest: Effort normal and breath sounds normal. She has no wheezes. She has no rales.  Abdominal: Soft. She exhibits no distension. There is tenderness.  Epigastric tenderness. Also diffuse tenderness.  Musculoskeletal: Normal range of motion. She exhibits no edema.  Neurological: She is oriented to person, place, and time. No cranial nerve deficit.  Skin: Skin is warm and dry. No rash noted. She is not diaphoretic.  Psychiatric:  Patient anxious.  Nursing note and vitals reviewed.   ED Course  Procedures (including critical care time) Labs Review Labs Reviewed  COMPREHENSIVE METABOLIC PANEL - Abnormal; Notable for the following:    Chloride 99 (*)    Glucose, Bld 132 (*)    BUN 24 (*)    AST 52 (*)    Total Bilirubin 1.3 (*)    All other components within normal limits  CBC WITH DIFFERENTIAL/PLATELET - Abnormal; Notable for the following:    WBC 10.7 (*)    Hemoglobin 15.5 (*)    MCH 34.5 (*)    Neutro Abs 9.0 (*)    All other components within normal limits  LIPASE, BLOOD - Abnormal; Notable for the following:    Lipase 682 (*)    All other components within normal limits  ETHANOL    Imaging Review No results found. I have personally reviewed and evaluated these images and lab results as part of my medical decision-making.   EKG Interpretation None      MDM   Final diagnoses:  Other acute pancreatitis    Patient is a pleasant 55 year old female presenting here with alcohol withdrawal and abdominal pain. Patient had  recent bender where she drank a lot of vodka all day every day for the last week. She stopped drinking yesterday morning. She reports being very shaky, nauseous. Patient also has history of pancreatitis and has abdominal pain today. We'll get labs. If lipase elevated would consider doing a CAT scan. Patient has history of alcoholic induced pancreatitis. We'll also give Ativan to help with symptoms of alcohol withdrawal.  3:05 PM Elevated lipase in contextof history of alcohol induced pancreatitis.   Unable to tolerate PO.  Will admit. Do not see emergent need for further imaging.   Dannielle Baskins Randall An, MD 07/08/16 1505

## 2016-07-08 NOTE — ED Notes (Signed)
MD at bedside. 

## 2016-07-08 NOTE — H&P (Signed)
History and Physical    Brittney Cooper ZOX:[id number] DOB: [birth year] DOA: 07/08/2016  PCP: [name], MD Consultants:  Madaline Guthrie - psych; Dayton Scrape - ortho [facility]  Chief Complaint: transferred from [facility]  HPI: Brittney Cooper is a 55 y.o. female with medical history significant of psychiatric disease, ETOH dependence, and alcoholic pancreatitis.  She reports a tremendous amount of stress that led to her drinking again - father with stage IV lung cancer, doing maintenance chemo q3w and she is caregiver;  mother had stroke - she is also helping to care for her - they don't live together.  Father went shopping, fell with multiple fractures.  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  He has continued to fall, having trouble getting home health for him and she is providing 100% support.  In order to cope, she started drinking again.  Alcoholic pancreatitis in Oct and nov and stopped drinking in Nov.  Started drinking again last Wednesday.  Drinking a big bottle of vodka q4 days.  Yesterday, patient started feeling bad - midepigastric pain.  Now, sore all over abdomen.  Vomiting started this AM, maybe 3 times today, last time was about 1630.  +shaking, muscle cramps.  Last drink was yesterday at 0800.  Has had DTs in the past but no halluicinations or seizures.   ED Course: elevated lipase, unable to tolerate PO  Review of Systems: As per HPI; otherwise 10 point review of systems reviewed and negative.   Ambulatory Status: ambulatory  Past Medical History  Diagnosis Date  . Bipolar disorder (HCC)   . Anxiety   . Panic attack   . Chronic pain     back  . OCD (obsessive compulsive disorder)   . ADHD (attention deficit hyperactivity disorder)   . Arthritis   . Hypertension   . Depression   . Fracture of lateral malleolus of right fibula   . Syncope     neurocardiogenic  . Alcoholic pancreatitis  10/16, 81/19, 7/17  . PTSD (post-traumatic stress disorder)     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, working on [employer] floor as a Engineer, civil (consulting)    Past Surgical History  Procedure Laterality Date  . Myringotomy    . Tonsillectomy    . Rhinoplasty    . Abdominal hysterectomy    . Anterior and posterior repair    . Finfger surgery    . Finger surgery    . Bunionectomy Bilateral   . Orif ankle fracture Right 07/25/2015    Procedure: OPEN REDUCTION INTERNAL FIXATION (ORIF) RIGHT ANKLE ;  Surgeon: Sheral Apley, MD;  Location: [facility];  Service: Orthopedics;  Laterality: Right;  . Ltcs      Social History   Social History  . Marital Status: Married    Spouse Name: N/A  . Number of Children: N/A  . Years of Education: N/A   Occupational History  . RN    Social History Main Topics  . Smoking status: Current Every Day Smoker -- 1.00 packs/day    Types: Cigarettes  . Smokeless tobacco: Never Used  . Alcohol Use: 0.0 oz/week    0 Standard drinks or equivalent per week     Comment: daily since last Wednesday  . Drug Use: No  . Sexual Activity: Not on file   Other Topics Concern  . Not on file   Social History Narrative  Allergies  Allergen Reactions  . Codeine Nausea And Vomiting  . Demerol [Meperidine] Other (See Comments)    Hallucinations     Family History  Problem Relation Age of Onset  . Cancer Father     stage IV lung  . Stroke Mother     Prior to Admission medications   Medication Sig Start Date End Date Taking? Authorizing Provider  ALPRAZolam (XANAX PO) Take 0.25 mg by mouth.    Yes Historical Provider, MD  Amphetamine-Dextroamphetamine (ADDERALL PO) Take 20 mg by mouth.    Yes Historical Provider, MD  aspirin EC 81 MG tablet Take 81 mg by mouth daily.   Yes Historical Provider, MD  losartan (COZAAR) 100 MG tablet Take 100 mg by mouth daily.  06/12/14  Yes Historical Provider, MD  Sertraline HCl (ZOLOFT PO) Take 100 mg by mouth daily.    Yes  Historical Provider, MD  traZODone (DESYREL) 150 MG tablet Take 150 mg by mouth at bedtime as needed for sleep.    Yes Historical Provider, MD  atorvastatin (LIPITOR) 40 MG tablet Take 40 mg by mouth daily.    Historical Provider, MD  clonazePAM (KLONOPIN) 1 MG tablet Take 1 mg by mouth Nightly.     Historical Provider, MD  metoCLOPramide (REGLAN) 10 MG tablet Take 1 tablet (10 mg total) by mouth every 6 (six) hours. 11/29/15   Margarita Grizzleanielle Ray, MD  omeprazole (PRILOSEC) 20 MG capsule Take 1 capsule (20 mg total) by mouth daily. 10/27/15   Joycie PeekBenjamin Cartner, PA-C  pantoprazole (PROTONIX) 20 MG tablet Take 1 tablet (20 mg total) by mouth daily. 11/29/15   Margarita Grizzleanielle Ray, MD  potassium chloride SA (K-DUR,KLOR-CON) 20 MEQ tablet Take 1 tablet (20 mEq total) by mouth 2 (two) times daily. 11/29/15   Margarita Grizzleanielle Ray, MD  traMADol (ULTRAM) 50 MG tablet Take 100 mg by mouth every 6 (six) hours as needed.    Historical Provider, MD    Physical Exam: Filed Vitals:   07/08/16 1456 07/08/16 1723 07/08/16 1832 07/08/16 2027  BP: 138/99 155/101 183/103 149/97  Pulse: 96 96 100 95  Temp:   97.8 F (36.6 C) 98 F (36.7 C)  TempSrc:   Oral Oral  Resp: 16 16 17 18   Height:      Weight:    84.868 kg (187 lb 1.6 oz)  SpO2: 95% 96% 100% 93%     General:   Appears calm and comfortable and is NAD Eyes:  PERRL, EOMI, normal lids, iris ENT:  grossly normal hearing, lips & tongue, mmm Neck:  no LAD, masses or thyromegaly Cardiovascular:  RRR, no m/r/g. No LE edema.  Respiratory:  CTA bilaterally, no w/r/r. Normal respiratory effort. Abdomen:  soft, tender primarily in upper quadrants and midepigastric region; nd, NABS Skin:  no rash or induration seen on limited exam Musculoskeletal:  grossly normal tone BUE/BLE, good ROM, no bony abnormality Psychiatric:  grossly normal mood and affect, speech fluent and appropriate, AOx3; mildly anxious appearing Neurologic:  CN 2-12 grossly intact, moves all extremities in  coordinated fashion, sensation intact  Labs on Admission: I have personally reviewed following labs and imaging studies  CBC:  Recent Labs Lab 07/08/16 1245  WBC 10.7*  NEUTROABS 9.0*  HGB 15.5*  HCT 44.0  MCV 98.0  PLT 239   Basic Metabolic Panel:  Recent Labs Lab 07/08/16 1245  NA 135  K 3.8  CL 99*  CO2 23  GLUCOSE 132*  BUN 24*  CREATININE 0.85  CALCIUM 9.4  GFR: Estimated Creatinine Clearance: 80.7 mL/min (by C-G formula based on Cr of 0.85). Liver Function Tests:  Recent Labs Lab 07/08/16 1245  AST 52*  ALT 48  ALKPHOS 115  BILITOT 1.3*  PROT 7.8  ALBUMIN 4.7    Recent Labs Lab 07/08/16 1245  LIPASE 682*   No results for input(s): AMMONIA in the last 168 hours. Coagulation Profile: No results for input(s): INR, PROTIME in the last 168 hours. Cardiac Enzymes: No results for input(s): CKTOTAL, CKMB, CKMBINDEX, TROPONINI in the last 168 hours. BNP (last 3 results) No results for input(s): PROBNP in the last 8760 hours. HbA1C: No results for input(s): HGBA1C in the last 72 hours. CBG: No results for input(s): GLUCAP in the last 168 hours. Lipid Profile: No results for input(s): CHOL, HDL, LDLCALC, TRIG, CHOLHDL, LDLDIRECT in the last 72 hours. Thyroid Function Tests: No results for input(s): TSH, T4TOTAL, FREET4, T3FREE, THYROIDAB in the last 72 hours. Anemia Panel: No results for input(s): VITAMINB12, FOLATE, FERRITIN, TIBC, IRON, RETICCTPCT in the last 72 hours. Urine analysis:    Component Value Date/Time   COLORURINE AMBER* 11/29/2015 0932   APPEARANCEUR CLEAR 11/29/2015 0932   LABSPEC 1.027 11/29/2015 0932   PHURINE 6.0 11/29/2015 0932   GLUCOSEU NEGATIVE 11/29/2015 0932   HGBUR SMALL* 11/29/2015 0932   BILIRUBINUR NEGATIVE 11/29/2015 0932   KETONESUR NEGATIVE 11/29/2015 0932   PROTEINUR 30* 11/29/2015 0932   UROBILINOGEN 0.2 10/27/2015 1805   NITRITE NEGATIVE 11/29/2015 0932   LEUKOCYTESUR NEGATIVE 11/29/2015 0932     Creatinine Clearance: Estimated Creatinine Clearance: 80.7 mL/min (by C-G formula based on Cr of 0.85).  Sepsis Labs: (procalcitonin:4,lacticidven:4) )No results found for this or any previous visit (from the past 240 hour(s)).   Radiological Exams on Admission: No results found.  EKG: not done  Assessment/Plan Principal Problem:   Alcoholic pancreatitis Active Problems:   Alcohol withdrawal (HCC)   Bipolar disorder (HCC)   Essential hypertension   Alcoholic pancreatitis -Lipase 914 in a symptomatic patient -No imaging is needed at this time -Admit to observation status -Thoroughly hydrate with IVF -Maintain strict NPO status -Pain control with morphine -Nausea control with Zofran  Alcohol withdrawal -CIWA protocol -Check UDS -Banana bag -Encourage cessation but also support group/rehab/etc  Psychiatric illness -Patient with a multitude of psychiatric diagnoses -Continue Zoloft and Trazodone -Hold additional BZD, stimulants, etc for now  HTN -Continue Cozaar   DVT prophylaxis: Lovenox  Code Status: Full - confirmed with patient Family Communication: None at bedside Disposition Plan:  Home once clinically improved Consults called: Likely needs SW but none currently Admission status: Observation   Jonah Blue MD [facility]  If 7PM-7AM, please contact night-coverage [email or web address] Password [email or web address]  07/09/2016, 1:46 AM

## 2016-07-08 NOTE — ED Notes (Signed)
Pt refused morphine, states "ill just wait until i get over there".  [facility] notified.

## 2016-07-08 NOTE — Progress Notes (Signed)
Called by EDP Dr.Mackuen [facility] 55/F with ETOh abuse, drinking heavily until yesterday presented to ER with Abd pain, alcoholic pancreatitis- has lipase of 682, and mild withdrawal required ativan x1, failed Po trial in ER, EDP requested obs, Vitals stable, [facility]  Zannie CovePreetha Marylin Lathon, MD

## 2016-07-08 NOTE — Progress Notes (Signed)
Pt arrived on unit via [facility]. Pt stable - Paged MD awaiting orders.

## 2016-07-09 ENCOUNTER — Observation Stay (HOSPITAL_COMMUNITY): Payer: 59

## 2016-07-09 DIAGNOSIS — M549 Dorsalgia, unspecified: Secondary | ICD-10-CM | POA: Diagnosis present

## 2016-07-09 DIAGNOSIS — Y900 Blood alcohol level of less than 20 mg/100 ml: Secondary | ICD-10-CM | POA: Diagnosis present

## 2016-07-09 DIAGNOSIS — Z7982 Long term (current) use of aspirin: Secondary | ICD-10-CM | POA: Diagnosis not present

## 2016-07-09 DIAGNOSIS — F429 Obsessive-compulsive disorder, unspecified: Secondary | ICD-10-CM | POA: Diagnosis present

## 2016-07-09 DIAGNOSIS — I10 Essential (primary) hypertension: Secondary | ICD-10-CM | POA: Diagnosis present

## 2016-07-09 DIAGNOSIS — K860 Alcohol-induced chronic pancreatitis: Secondary | ICD-10-CM

## 2016-07-09 DIAGNOSIS — G8929 Other chronic pain: Secondary | ICD-10-CM | POA: Diagnosis present

## 2016-07-09 DIAGNOSIS — Z79899 Other long term (current) drug therapy: Secondary | ICD-10-CM | POA: Diagnosis not present

## 2016-07-09 DIAGNOSIS — F909 Attention-deficit hyperactivity disorder, unspecified type: Secondary | ICD-10-CM | POA: Diagnosis present

## 2016-07-09 DIAGNOSIS — R1013 Epigastric pain: Secondary | ICD-10-CM | POA: Diagnosis present

## 2016-07-09 DIAGNOSIS — F410 Panic disorder [episodic paroxysmal anxiety] without agoraphobia: Secondary | ICD-10-CM | POA: Diagnosis present

## 2016-07-09 DIAGNOSIS — F10239 Alcohol dependence with withdrawal, unspecified: Secondary | ICD-10-CM | POA: Diagnosis present

## 2016-07-09 DIAGNOSIS — Z888 Allergy status to other drugs, medicaments and biological substances status: Secondary | ICD-10-CM | POA: Diagnosis not present

## 2016-07-09 DIAGNOSIS — K852 Alcohol induced acute pancreatitis without necrosis or infection: Secondary | ICD-10-CM | POA: Diagnosis present

## 2016-07-09 DIAGNOSIS — K859 Acute pancreatitis without necrosis or infection, unspecified: Secondary | ICD-10-CM | POA: Diagnosis present

## 2016-07-09 DIAGNOSIS — F17210 Nicotine dependence, cigarettes, uncomplicated: Secondary | ICD-10-CM | POA: Diagnosis present

## 2016-07-09 DIAGNOSIS — Z885 Allergy status to narcotic agent status: Secondary | ICD-10-CM | POA: Diagnosis not present

## 2016-07-09 DIAGNOSIS — F4310 Post-traumatic stress disorder, unspecified: Secondary | ICD-10-CM | POA: Diagnosis present

## 2016-07-09 DIAGNOSIS — F319 Bipolar disorder, unspecified: Secondary | ICD-10-CM | POA: Diagnosis present

## 2016-07-09 LAB — CBC
HCT: 40.9 % (ref 36.0–46.0)
Hemoglobin: 13.9 g/dL (ref 12.0–15.0)
MCH: 33.7 pg (ref 26.0–34.0)
MCHC: 34 g/dL (ref 30.0–36.0)
MCV: 99.3 fL (ref 78.0–100.0)
PLATELETS: 205 10*3/uL (ref 150–400)
RBC: 4.12 MIL/uL (ref 3.87–5.11)
RDW: 14.2 % (ref 11.5–15.5)
WBC: 11.2 10*3/uL — AB (ref 4.0–10.5)

## 2016-07-09 LAB — RAPID URINE DRUG SCREEN, HOSP PERFORMED
AMPHETAMINES: POSITIVE — AB
BENZODIAZEPINES: POSITIVE — AB
Barbiturates: NOT DETECTED
COCAINE: NOT DETECTED
OPIATES: POSITIVE — AB
TETRAHYDROCANNABINOL: NOT DETECTED

## 2016-07-09 LAB — BASIC METABOLIC PANEL
ANION GAP: 12 (ref 5–15)
BUN: 8 mg/dL (ref 6–20)
CALCIUM: 8.3 mg/dL — AB (ref 8.9–10.3)
CO2: 23 mmol/L (ref 22–32)
CREATININE: 0.56 mg/dL (ref 0.44–1.00)
Chloride: 98 mmol/L — ABNORMAL LOW (ref 101–111)
GFR calc non Af Amer: >60 mL/min (ref >60)
Glucose, Bld: 133 mg/dL — ABNORMAL HIGH (ref 65–99)
Potassium: 3.3 mmol/L — ABNORMAL LOW (ref 3.5–5.1)
SODIUM: 133 mmol/L — AB (ref 135–145)

## 2016-07-09 LAB — LIPASE, BLOOD: Lipase: 867 U/L — ABNORMAL HIGH (ref 11–51)

## 2016-07-09 MED ORDER — HYDROMORPHONE HCL 1 MG/ML IJ SOLN
1.0000 mg | INTRAMUSCULAR | Status: DC | PRN
  Administered 2016-07-09 (×2): 1 mg via INTRAVENOUS
  Administered 2016-07-09 – 2016-07-11 (×7): 2 mg via INTRAVENOUS
  Filled 2016-07-09 (×4): qty 2
  Filled 2016-07-09: qty 1
  Filled 2016-07-09 (×3): qty 2
  Filled 2016-07-09: qty 1

## 2016-07-09 MED ORDER — SODIUM CHLORIDE 0.9 % IV SOLN
INTRAVENOUS | Status: DC
  Administered 2016-07-09 – 2016-07-10 (×3): via INTRAVENOUS
  Filled 2016-07-09 (×10): qty 1000

## 2016-07-09 MED ORDER — POTASSIUM CHLORIDE CRYS ER 20 MEQ PO TBCR: 40.0000 meq | EXTENDED_RELEASE_TABLET | Freq: Once | ORAL | Status: DC

## 2016-07-09 NOTE — Progress Notes (Signed)
PROGRESS NOTE    Brittney Cooper  ZOX:[id number] DOB: September 04, 1961 DOA: 07/08/2016 PCP: [name], MD   Brief Narrative:is a 55 y.o. female with medical history significant of psychiatric disease, ETOH dependence, and alcoholic pancreatitis. She reports a tremendous amount of stress that led to her drinking again - Alcoholic pancreatitis in Oct and nov and stopped drinking in Nov. Started drinking again last Wednesday. Drinking a big bottle of vodka q4 days. now with mild ETOH pancreatitis and withdrawal  Assessment & Plan:  Alcoholic pancreatitis -mild, lipase 867 this am with epigastric tenderness -also check RUQ [place]  To r/o gall stones -NPO, ice chips, IVF =cut down rate to 125cc/hr -change narcotic to dilaudid, morphine not helping  Alcohol withdrawal -mild, CIWA scores low, continue thiamine and PRN Ativan  Psychiatric illness -Continue Zoloft and Trazodone -Hold additional BZD, stimulants, etc for now  HTN -Continue Cozaar  DVT prophylaxis: Lovenox  Code Status: Full  Family Communication: None at bedside Disposition Plan: Home once clinically improved, ? 1-2days  Subjective: Morphine not helping, complains of abd pain  Objective: Filed Vitals:   07/08/16 1832 07/08/16 2027 07/09/16 0550 07/09/16 0940  BP: 183/103 149/97 159/85 153/94  Pulse: 100 95 105 102  Temp: 97.8 F (36.6 C) 98 F (36.7 C) 98 F (36.7 C) 98 F (36.7 C)  TempSrc: Oral Oral Oral Oral  Resp: Height:      Weight:  84.868 kg (187 lb 1.6 oz)    SpO2: 100% 93% 94% 95%    Intake/Output Summary (Last 24 hours) at 07/09/16 1155 Last data filed at 07/09/16 0900  Gross per 24 hour  Intake      0 ml  Output    200 ml  Net   -200 ml   Filed Weights   07/08/16 1202 07/08/16 2027  Weight: 83.915 kg (185 lb) 84.868 kg (187 lb 1.6 oz)    Examination:  General exam:anxious, pleasant female, no distres Respiratory system: Clear to auscultation. Respiratory effort  normal. Cardiovascular system: S1 & S2 heard, RRR. No JVD, murmurs, rubs, gallops or clicks. No pedal edema. Gastrointestinal system: Abdomen soft, mild epigastric tenderness, BS present Central nervous system: Alert and oriented. No focal neurological deficits. No asterixes, mild tremors Extremities: Symmetric 5 x 5 power. Skin: No rashes, lesions or ulcers Psychiatry: Judgement and insight appear normal. Mood & affect appropriate.     Data Reviewed: I have personally reviewed following labs and imaging studies  CBC:  Recent Labs Lab 07/08/16 1245 07/09/16 0459  WBC 10.7* 11.2*  NEUTROABS 9.0*  --   HGB 15.5* 13.9  HCT 44.0 40.9  MCV 98.0 99.3  PLT 239 205   Basic Metabolic Panel:  Recent Labs Lab 07/08/16 1245 07/09/16 0459  NA 135 133*  K 3.8 3.3*  CL 99* 98*  CO2 23 23  GLUCOSE 132* 133*  BUN 24* 8  CREATININE 0.85 0.56  CALCIUM 9.4 8.3*   GFR: Estimated Creatinine Clearance: 85.8 mL/min (by C-G formula based on Cr of 0.56). Liver Function Tests:  Recent Labs Lab 07/08/16 1245  AST 52*  ALT 48  ALKPHOS 115  BILITOT 1.3*  PROT 7.8  ALBUMIN 4.7    Recent Labs Lab 07/08/16 1245 07/09/16 0755  LIPASE 682* 867*   No results for input(s): AMMONIA in the last 168 hours. Coagulation Profile: No results for input(s): INR, PROTIME in the last 168 hours. Cardiac Enzymes: No results for input(s): CKTOTAL, CKMB, CKMBINDEX, TROPONINI in the  last 168 hours. BNP (last 3 results) No results for input(s): PROBNP in the last 8760 hours. HbA1C: No results for input(s): HGBA1C in the last 72 hours. CBG: No results for input(s): GLUCAP in the last 168 hours. Lipid Profile: No results for input(s): CHOL, HDL, LDLCALC, TRIG, CHOLHDL, LDLDIRECT in the last 72 hours. Thyroid Function Tests: No results for input(s): TSH, T4TOTAL, FREET4, T3FREE, THYROIDAB in the last 72 hours. Anemia Panel: No results for input(s): VITAMINB12, FOLATE, FERRITIN, TIBC, IRON,  RETICCTPCT in the last 72 hours. Urine analysis:    Component Value Date/Time   COLORURINE AMBER* 11/29/2015 0932   APPEARANCEUR CLEAR 11/29/2015 0932   LABSPEC 1.027 11/29/2015 0932   PHURINE 6.0 11/29/2015 0932   GLUCOSEU NEGATIVE 11/29/2015 0932   HGBUR SMALL* 11/29/2015 0932   BILIRUBINUR NEGATIVE 11/29/2015 0932   KETONESUR NEGATIVE 11/29/2015 0932   PROTEINUR 30* 11/29/2015 0932   UROBILINOGEN 0.2 10/27/2015 1805   NITRITE NEGATIVE 11/29/2015 0932   LEUKOCYTESUR NEGATIVE 11/29/2015 0932   Sepsis Labs: @LABRCNTIP (procalcitonin:4,lacticidven:4)  )No results found for this or any previous visit (from the past 240 hour(s)).       Radiology Studies: [place] Abdomen Limited Ruq  07/09/2016  CLINICAL DATA:  History of pancreatitis EXAM: US ABDOMEN LIMITED - RIGHT UPPER QUADRANT COMPARISON:  10/27/2015 FINDINGS: Gallbladder: No gallstones or wall thickening visualized. No sonographic Murphy sign noted by sonographer. Common bile duct: Diameter: 7 mm Liver: No focal lesion is noted. Mild increased echogenicity is seen consistent with fatty infiltration. IMPRESSION: Minimal fullness of the common bile duct although no intrahepatic biliary ductal dilatation is noted. Fatty infiltration of the liver. Electronically Signed   By: Alcide CleverMark  Lukens M.D.   On: 07/09/2016 11:35        Scheduled Meds: . enoxaparin (LOVENOX) injection  40 mg Subcutaneous Q24H  . LORazepam  0-4 mg Intravenous Q6H   Followed by  . [START ON 07/10/2016] LORazepam  0-4 mg Intravenous Q12H  . losartan  100 mg Oral Daily  . potassium chloride  40 mEq Oral Once  . sertraline  100 mg Oral Daily   Continuous Infusions: . sodium chloride 0.9 % 1,000 mL with potassium chloride 40 mEq infusion          Time spent: 35min    Zannie CovePreetha Annett Boxwell, MD [facility] Pager [phone]  If 7PM-7AM, please contact night-coverage [email or web address] Password [email or web address] 07/09/2016, 11:55 AM

## 2016-07-10 DIAGNOSIS — F10239 Alcohol dependence with withdrawal, unspecified: Secondary | ICD-10-CM

## 2016-07-10 LAB — COMPREHENSIVE METABOLIC PANEL
ALK PHOS: 94 U/L (ref 38–126)
ALT: 22 U/L (ref 14–54)
AST: 20 U/L (ref 15–41)
Albumin: 2.8 g/dL — ABNORMAL LOW (ref 3.5–5.0)
Anion gap: 10 (ref 5–15)
BILIRUBIN TOTAL: 1.1 mg/dL (ref 0.3–1.2)
BUN: 8 mg/dL (ref 6–20)
CALCIUM: 8.4 mg/dL — AB (ref 8.9–10.3)
CO2: 25 mmol/L (ref 22–32)
CREATININE: 0.81 mg/dL (ref 0.44–1.00)
Chloride: 104 mmol/L (ref 101–111)
Glucose, Bld: 110 mg/dL — ABNORMAL HIGH (ref 65–99)
Potassium: 4.3 mmol/L (ref 3.5–5.1)
Sodium: 139 mmol/L (ref 135–145)
TOTAL PROTEIN: 5.5 g/dL — AB (ref 6.5–8.1)

## 2016-07-10 LAB — CBC
HEMATOCRIT: 37.5 % (ref 36.0–46.0)
Hemoglobin: 12.5 g/dL (ref 12.0–15.0)
MCH: 33.6 pg (ref 26.0–34.0)
MCHC: 33.3 g/dL (ref 30.0–36.0)
MCV: 100.8 fL — AB (ref 78.0–100.0)
Platelets: 214 10*3/uL (ref 150–400)
RBC: 3.72 MIL/uL — AB (ref 3.87–5.11)
RDW: 14.3 % (ref 11.5–15.5)
WBC: 14.2 10*3/uL — AB (ref 4.0–10.5)

## 2016-07-10 LAB — LIPASE, BLOOD: LIPASE: 90 U/L — AB (ref 11–51)

## 2016-07-10 MED ORDER — NICOTINE 21 MG/24HR TD PT24
21.0000 mg | MEDICATED_PATCH | Freq: Every day | TRANSDERMAL | Status: DC
  Administered 2016-07-10 – 2016-07-11 (×2): 21 mg via TRANSDERMAL
  Filled 2016-07-10 (×2): qty 1

## 2016-07-10 MED ORDER — POLYETHYLENE GLYCOL 3350 17 G PO PACK
17.0000 g | PACK | Freq: Every day | ORAL | Status: DC
  Administered 2016-07-10 – 2016-07-11 (×2): 17 g via ORAL
  Filled 2016-07-10 (×2): qty 1

## 2016-07-10 MED ORDER — SENNOSIDES-DOCUSATE SODIUM 8.6-50 MG PO TABS
1.0000 | ORAL_TABLET | Freq: Two times a day (BID) | ORAL | Status: DC
  Administered 2016-07-10 – 2016-07-11 (×3): 1 via ORAL
  Filled 2016-07-10 (×3): qty 1

## 2016-07-10 NOTE — Progress Notes (Signed)
PROGRESS NOTE    Brittney Cooper  ZOX:[id number] DOB: 1961/07/05 DOA: 07/08/2016 PCP: [name], MD   Brief Narrative:is a 55 y.o. female with medical history significant of psychiatric disease, ETOH dependence, and alcoholic pancreatitis. She reports a tremendous amount of stress that led to her drinking again - Alcoholic pancreatitis in Oct and nov and stopped drinking in Nov. Started drinking again last Wednesday. Drinking a big bottle of vodka q4 days. now with mild ETOH pancreatitis and withdrawal  Assessment & Plan:  Alcoholic pancreatitis -mild, lipase was 867, now improving, 90 today -continue IVF, start clears and advance diet as tolerated -RUQ [place] negative for gall stones -supportive care, ambulate -add laxatives  Alcohol withdrawal -mild, CIWA scores low, continue thiamine and PRN Ativan  Psychiatric illness -Continue Zoloft and Trazodone -Hold additional BZD, stimulants, etc for now  HTN -Continue Cozaar  DVT prophylaxis: Lovenox  Code Status: Full  Family Communication: None at bedside Disposition Plan: Home tomorrow if stable  Subjective: Pain improving, more diffuse now Objective: Filed Vitals:   07/09/16 1700 07/09/16 2047 07/10/16 0558 07/10/16 0945  BP: 144/86 147/82 150/91 121/67  Pulse: 114 108 110 102  Temp: 98.2 F (36.8 C) 98.9 F (37.2 C) 97.7 F (36.5 C) 98.2 F (36.8 C)  TempSrc: Oral Oral Oral Oral  Resp: Height:      Weight:      SpO2: 94% 93% 90% 92%    Intake/Output Summary (Last 24 hours) at 07/10/16 1250 Last data filed at 07/10/16 0900  Gross per 24 hour  Intake 1587.5 ml  Output    200 ml  Net 1387.5 ml   Filed Weights   07/08/16 1202 07/08/16 2027  Weight: 83.915 kg (185 lb) 84.868 kg (187 lb 1.6 oz)    Examination:  General exam:anxious, pleasant female, no distress Respiratory system: Clear to auscultation. Respiratory effort normal. Cardiovascular system: S1 & S2 heard, RRR. No JVD,  murmurs, rubs, gallops or clicks. No pedal edema. Gastrointestinal system: Abdomen soft, non tender today, BS present Central nervous system: Alert and oriented. No focal neurological deficits. No asterixes, mild tremors Extremities: Symmetric 5 x 5 power. Skin: No rashes, lesions or ulcers Psychiatry: Judgement and insight appear normal. Mood & affect appropriate.     Data Reviewed: I have personally reviewed following labs and imaging studies  CBC:  Recent Labs Lab 07/08/16 1245 07/09/16 0459 07/10/16 0550  WBC 10.7* 11.2* 14.2*  NEUTROABS 9.0*  --   --   HGB 15.5* 13.9 12.5  HCT 44.0 40.9 37.5  MCV 98.0 99.3 100.8*  PLT 239 205 214   Basic Metabolic Panel:  Recent Labs Lab 07/08/16 1245 07/09/16 0459 07/10/16 0550  NA 135 133* 139  K 3.8 3.3* 4.3  CL 99* 98* 104  CO2 GLUCOSE 132* 133* 110*  BUN 24* 8 8  CREATININE 0.85 0.56 0.81  CALCIUM 9.4 8.3* 8.4*   GFR: Estimated Creatinine Clearance: 84.7 mL/min (by C-G formula based on Cr of 0.81). Liver Function Tests:  Recent Labs Lab 07/08/16 1245 07/10/16 0550  AST 52* 20  ALT 48 22  ALKPHOS 115 94  BILITOT 1.3* 1.1  PROT 7.8 5.5*  ALBUMIN 4.7 2.8*    Recent Labs Lab 07/08/16 1245 07/09/16 0755 07/10/16 0550  LIPASE 682* 867* 90*   No results for input(s): AMMONIA in the last 168 hours. Coagulation Profile: No results for input(s): INR, PROTIME in the last 168 hours. Cardiac Enzymes: No  results for input(s): CKTOTAL, CKMB, CKMBINDEX, TROPONINI in the last 168 hours. BNP (last 3 results) No results for input(s): PROBNP in the last 8760 hours. HbA1C: No results for input(s): HGBA1C in the last 72 hours. CBG: No results for input(s): GLUCAP in the last 168 hours. Lipid Profile: No results for input(s): CHOL, HDL, LDLCALC, TRIG, CHOLHDL, LDLDIRECT in the last 72 hours. Thyroid Function Tests: No results for input(s): TSH, T4TOTAL, FREET4, T3FREE, THYROIDAB in the last 72 hours. Anemia  Panel: No results for input(s): VITAMINB12, FOLATE, FERRITIN, TIBC, IRON, RETICCTPCT in the last 72 hours. Urine analysis:    Component Value Date/Time   COLORURINE AMBER* 11/29/2015 0932   APPEARANCEUR CLEAR 11/29/2015 0932   LABSPEC 1.027 11/29/2015 0932   PHURINE 6.0 11/29/2015 0932   GLUCOSEU NEGATIVE 11/29/2015 0932   HGBUR SMALL* 11/29/2015 0932   BILIRUBINUR NEGATIVE 11/29/2015 0932   KETONESUR NEGATIVE 11/29/2015 0932   PROTEINUR 30* 11/29/2015 0932   UROBILINOGEN 0.2 10/27/2015 1805   NITRITE NEGATIVE 11/29/2015 0932   LEUKOCYTESUR NEGATIVE 11/29/2015 0932   Sepsis Labs: @LABRCNTIP (procalcitonin:4,lacticidven:4)  )No results found for this or any previous visit (from the past 240 hour(s)).       Radiology Studies: [place] Abdomen Limited Ruq  07/09/2016  CLINICAL DATA:  History of pancreatitis EXAM: US ABDOMEN LIMITED - RIGHT UPPER QUADRANT COMPARISON:  10/27/2015 FINDINGS: Gallbladder: No gallstones or wall thickening visualized. No sonographic Murphy sign noted by sonographer. Common bile duct: Diameter: 7 mm Liver: No focal lesion is noted. Mild increased echogenicity is seen consistent with fatty infiltration. IMPRESSION: Minimal fullness of the common bile duct although no intrahepatic biliary ductal dilatation is noted. Fatty infiltration of the liver. Electronically Signed   By: Alcide CleverMark  Lukens M.D.   On: 07/09/2016 11:35        Scheduled Meds: . enoxaparin (LOVENOX) injection  40 mg Subcutaneous Q24H  . LORazepam  0-4 mg Intravenous Q6H   Followed by  . LORazepam  0-4 mg Intravenous Q12H  . losartan  100 mg Oral Daily  . polyethylene glycol  17 g Oral Daily  . potassium chloride  40 mEq Oral Once  . senna-docusate  1 tablet Oral BID  . sertraline  100 mg Oral Daily   Continuous Infusions: . sodium chloride 0.9 % 1,000 mL with potassium chloride 40 mEq infusion 125 mL/hr at 07/10/16 1158     LOS: 1 day    Time spent: 35min    Zannie CovePreetha Ruchel Brandenburger, MD [facility] Pager [phone]  If 7PM-7AM, please contact night-coverage [email or web address] Password [email or web address] 07/10/2016, 12:50 PM

## 2016-07-11 LAB — COMPREHENSIVE METABOLIC PANEL
ALBUMIN: 2.7 g/dL — AB (ref 3.5–5.0)
ALK PHOS: 112 U/L (ref 38–126)
ALT: 29 U/L (ref 14–54)
ANION GAP: 4 — AB (ref 5–15)
AST: 48 U/L — ABNORMAL HIGH (ref 15–41)
BILIRUBIN TOTAL: 1 mg/dL (ref 0.3–1.2)
BUN: 7 mg/dL (ref 6–20)
CALCIUM: 8.5 mg/dL — AB (ref 8.9–10.3)
CO2: 26 mmol/L (ref 22–32)
Chloride: 108 mmol/L (ref 101–111)
Creatinine, Ser: 0.73 mg/dL (ref 0.44–1.00)
GFR calc non Af Amer: >60 mL/min (ref >60)
GLUCOSE: 116 mg/dL — AB (ref 65–99)
POTASSIUM: 4.1 mmol/L (ref 3.5–5.1)
SODIUM: 138 mmol/L (ref 135–145)
TOTAL PROTEIN: 5.8 g/dL — AB (ref 6.5–8.1)

## 2016-07-11 LAB — CBC
HCT: 33.6 % — ABNORMAL LOW (ref 36.0–46.0)
Hemoglobin: 11 g/dL — ABNORMAL LOW (ref 12.0–15.0)
MCH: 33.6 pg (ref 26.0–34.0)
MCHC: 32.7 g/dL (ref 30.0–36.0)
MCV: 102.8 fL — ABNORMAL HIGH (ref 78.0–100.0)
Platelets: 168 10*3/uL (ref 150–400)
RBC: 3.27 MIL/uL — ABNORMAL LOW (ref 3.87–5.11)
RDW: 14.8 % (ref 11.5–15.5)
WBC: 9.4 10*3/uL (ref 4.0–10.5)

## 2016-07-11 MED ORDER — ALPRAZOLAM 0.25 MG PO TABS: 0.2500 mg | ORAL_TABLET | Freq: Two times a day (BID) | ORAL | Status: DC | PRN

## 2016-07-11 MED ORDER — HYDROCODONE-ACETAMINOPHEN 5-300 MG PO TABS: 1.0000 | ORAL_TABLET | Freq: Four times a day (QID) | ORAL | Status: DC | PRN

## 2016-07-11 MED ORDER — POLYETHYLENE GLYCOL 3350 17 G PO PACK: 17.0000 g | PACK | Freq: Every day | ORAL | Status: DC | PRN

## 2016-07-11 NOTE — Progress Notes (Signed)
Patient discharge teaching given, including activity, diet, follow-up appoints, and medications. Patient verbalized understanding of all discharge instructions. IV access was d/c'd. Vitals are stable. Skin is intact except as charted in most recent assessments. Pt to be escorted out by NT, to be driven home by family.  Grason Brailsford, MBA, BSN, RN 

## 2016-07-11 NOTE — Discharge Summary (Signed)
Physician Discharge Summary  Brittney Cooper FUX:[id number] DOB: 09/23/1961 DOA: 07/08/2016  PCP: Brittney Cooper,[name], MD  Admit date: 07/08/2016 Discharge date: 07/11/2016  Time spent: 35 minutes  Recommendations for Outpatient Follow-up:  1. PCP in 1 week, ETOH cessation counseling resources   Discharge Diagnoses:  Principal Problem:   Alcoholic pancreatitis Active Problems:   Alcohol withdrawal (HCC)   Bipolar disorder (HCC)   Essential hypertension   Pancreatitis   Anxiety  Discharge Condition: stable  Diet recommendation: soft diet advance as tolerated  Filed Weights   07/08/16 1202 07/08/16 2027 07/10/16 2030  Weight: 83.915 kg (185 lb) 84.868 kg (187 lb 1.6 oz) 86.183 kg (190 lb)    History of present illness:  55 y.o. female with medical history significant of psychiatric disease, ETOH dependence, and alcoholic pancreatitis. She reported a tremendous amount of stress that led to her drinking again - Alcoholic pancreatitis in Oct and nov and stopped drinking in Nov. Started drinking again last Wednesday. Drinking a big bottle of vodka q4 days, admitted with abd pain, N/V  Hospital Course:  Alcoholic pancreatitis -mild, lipase was 867, improved to 90 yesterday -clinically improved with bowel rest, IVF, diet was gradually advanced and discharged home in a stable condition -RUQ US did not show gall stones  Alcohol withdrawal -mild, CIWA scores remained low and didn't require Ativan per CIWA scores -counseled, resources for ETOH counseling given  Psychiatric illness -Continue Zoloft and Trazodone -resumed home xanax  HTN -Continue Cozaar  Discharge Exam: Filed Vitals:   07/11/16 0605 07/11/16 0900  BP: 124/80 128/84  Pulse: 87 89  Temp: 98.4 F (36.9 C) 98.2 F (36.8 C)  Resp: 21 18    General: AAOx3 Cardiovascular: S1S2/RRR Respiratory:CTAB  Discharge Instructions    Current Discharge Medication List    START taking these medications   Details   Hydrocodone-Acetaminophen 5-300 MG TABS Take 1 tablet by mouth every 6 (six) hours as needed (for moderate to severe pain). Qty: 10 each, Refills: 0    polyethylene glycol (MIRALAX / GLYCOLAX) packet Take 17 g by mouth daily as needed. Qty: 10 each, Refills: 0      CONTINUE these medications which have NOT CHANGED   Details  ALPRAZolam (XANAX) 0.5 MG tablet Take 0.5 mg by mouth daily. Take every day per patient    amphetamine-dextroamphetamine (ADDERALL) 20 MG tablet Take 20 mg by mouth 3 (three) times daily. Patient states she is taking this medication 3 times daily    aspirin EC 81 MG tablet Take 81 mg by mouth daily.    atorvastatin (LIPITOR) 40 MG tablet Take 40 mg by mouth daily.    losartan (COZAAR) 100 MG tablet Take 100 mg by mouth daily.     sertraline (ZOLOFT) 100 MG tablet Take 150 mg by mouth daily. Patient states she splits the 100mg  in half to make total dose of 150mg     traZODone (DESYREL) 150 MG tablet Take 150 mg by mouth at bedtime as needed for sleep.        Allergies  Allergen Reactions  . Codeine Nausea And Vomiting  . Demerol [Meperidine] Other (See Comments)    Hallucinations    Follow-up Information    [name]. Schedule an appointment as soon as possible for a visit in 1 week.   Specialty:  Internal Medicine   Contact information:   [address] [place] [id number] [phone]        The results of significant diagnostics from  this hospitalization (including imaging, microbiology, ancillary and laboratory) are listed below for reference.    Significant Diagnostic Studies: US Abdomen Limited Ruq  Jul 19, 2016  CLINICAL DATA:  History of pancreatitis EXAM: US ABDOMEN LIMITED - RIGHT UPPER QUADRANT COMPARISON:  10/27/2015 FINDINGS: Gallbladder: No gallstones or wall thickening visualized. No sonographic Murphy sign noted by sonographer. Common bile duct: Diameter: 7 mm Liver: No focal lesion is noted. Mild  increased echogenicity is seen consistent with fatty infiltration. IMPRESSION: Minimal fullness of the common bile duct although no intrahepatic biliary ductal dilatation is noted. Fatty infiltration of the liver. Electronically Signed   By: Alcide Clever M.D.   On: 19-Jul-2016 11:35    Microbiology: No results found for this or any previous visit (from the past 240 hour(s)).   Labs: Basic Metabolic Panel:  Recent Labs Lab 07/08/16 1245 2016/07/19 0459 07/10/16 0550 07/11/16 0406  NA 135 133* 139 138  K 3.8 3.3* 4.3 4.1  CL 99* 98* 104 108  CO2 GLUCOSE 132* 133* 110* 116*  BUN 24* CREATININE 0.85 0.56 0.81 0.73  CALCIUM 9.4 8.3* 8.4* 8.5*   Liver Function Tests:  Recent Labs Lab 07/08/16 1245 07/10/16 0550 07/11/16 0406  AST 52* 20 48*  ALT 48 22 29  ALKPHOS 115 94 112  BILITOT 1.3* 1.1 1.0  PROT 7.8 5.5* 5.8*  ALBUMIN 4.7 2.8* 2.7*    Recent Labs Lab 07/08/16 1245 07/19/2016 0755 07/10/16 0550  LIPASE 682* 867* 90*   No results for input(s): AMMONIA in the last 168 hours. CBC:  Recent Labs Lab 07/08/16 1245 2016/07/19 0459 07/10/16 0550 07/11/16 0406  WBC 10.7* 11.2* 14.2* 9.4  NEUTROABS 9.0*  --   --   --   HGB 15.5* 13.9 12.5 11.0*  HCT 44.0 40.9 37.5 33.6*  MCV 98.0 99.3 100.8* 102.8*  PLT 239 205 214 168   Cardiac Enzymes: No results for input(s): CKTOTAL, CKMB, CKMBINDEX, TROPONINI in the last 168 hours. BNP: BNP (last 3 results) No results for input(s): BNP in the last 8760 hours.  ProBNP (last 3 results) No results for input(s): PROBNP in the last 8760 hours.  CBG: No results for input(s): GLUCAP in the last 168 hours.     SignedZannie Cove MD.  [facility] 07/11/2016, 12:01 PM

## 2016-07-12 ENCOUNTER — Emergency Department (HOSPITAL_COMMUNITY)
Admission: EM | Admit: 2016-07-12 | Discharge: 2016-07-12 | Disposition: A | Discharge status: Home / Self Care | Payer: 59 | Attending: Emergency Medicine | Admitting: Emergency Medicine

## 2016-07-12 ENCOUNTER — Emergency Department (HOSPITAL_COMMUNITY): Payer: 59

## 2016-07-12 ENCOUNTER — Encounter (HOSPITAL_COMMUNITY): Payer: Self-pay | Admitting: Oncology

## 2016-07-12 DIAGNOSIS — K5900 Constipation, unspecified: Secondary | ICD-10-CM | POA: Diagnosis not present

## 2016-07-12 DIAGNOSIS — Z79899 Other long term (current) drug therapy: Secondary | ICD-10-CM | POA: Insufficient documentation

## 2016-07-12 DIAGNOSIS — R945 Abnormal results of liver function studies: Secondary | ICD-10-CM | POA: Insufficient documentation

## 2016-07-12 DIAGNOSIS — F17210 Nicotine dependence, cigarettes, uncomplicated: Secondary | ICD-10-CM | POA: Insufficient documentation

## 2016-07-12 DIAGNOSIS — R7989 Other specified abnormal findings of blood chemistry: Secondary | ICD-10-CM

## 2016-07-12 DIAGNOSIS — M545 Low back pain: Secondary | ICD-10-CM | POA: Insufficient documentation

## 2016-07-12 DIAGNOSIS — I10 Essential (primary) hypertension: Secondary | ICD-10-CM | POA: Diagnosis not present

## 2016-07-12 DIAGNOSIS — R112 Nausea with vomiting, unspecified: Secondary | ICD-10-CM | POA: Diagnosis present

## 2016-07-12 DIAGNOSIS — Z7982 Long term (current) use of aspirin: Secondary | ICD-10-CM | POA: Diagnosis not present

## 2016-07-12 DIAGNOSIS — F909 Attention-deficit hyperactivity disorder, unspecified type: Secondary | ICD-10-CM | POA: Insufficient documentation

## 2016-07-12 DIAGNOSIS — Z9622 Myringotomy tube(s) status: Secondary | ICD-10-CM | POA: Diagnosis not present

## 2016-07-12 DIAGNOSIS — F319 Bipolar disorder, unspecified: Secondary | ICD-10-CM | POA: Insufficient documentation

## 2016-07-12 DIAGNOSIS — K860 Alcohol-induced chronic pancreatitis: Secondary | ICD-10-CM | POA: Diagnosis not present

## 2016-07-12 DIAGNOSIS — M1990 Unspecified osteoarthritis, unspecified site: Secondary | ICD-10-CM | POA: Insufficient documentation

## 2016-07-12 LAB — URINALYSIS, ROUTINE W REFLEX MICROSCOPIC
Bilirubin Urine: NEGATIVE
Glucose, UA: NEGATIVE mg/dL
Ketones, ur: 40 mg/dL — AB
LEUKOCYTES UA: NEGATIVE
NITRITE: NEGATIVE
PH: 6 (ref 5.0–8.0)
Protein, ur: NEGATIVE mg/dL
SPECIFIC GRAVITY, URINE: 1.016 (ref 1.005–1.030)

## 2016-07-12 LAB — COMPREHENSIVE METABOLIC PANEL
ALK PHOS: 127 U/L — AB (ref 38–126)
ALT: 70 U/L — ABNORMAL HIGH (ref 14–54)
ANION GAP: 9 (ref 5–15)
AST: 103 U/L — ABNORMAL HIGH (ref 15–41)
Albumin: 3.4 g/dL — ABNORMAL LOW (ref 3.5–5.0)
BUN: 7 mg/dL (ref 6–20)
CALCIUM: 8.5 mg/dL — AB (ref 8.9–10.3)
CO2: 24 mmol/L (ref 22–32)
Chloride: 105 mmol/L (ref 101–111)
Creatinine, Ser: 0.56 mg/dL (ref 0.44–1.00)
GFR calc non Af Amer: >60 mL/min (ref >60)
Glucose, Bld: 136 mg/dL — ABNORMAL HIGH (ref 65–99)
Potassium: 3.4 mmol/L — ABNORMAL LOW (ref 3.5–5.1)
SODIUM: 138 mmol/L (ref 135–145)
Total Bilirubin: 0.8 mg/dL (ref 0.3–1.2)
Total Protein: 7 g/dL (ref 6.5–8.1)

## 2016-07-12 LAB — CBC
HCT: 32.5 % — ABNORMAL LOW (ref 36.0–46.0)
HEMOGLOBIN: 11.2 g/dL — AB (ref 12.0–15.0)
MCH: 34.3 pg — AB (ref 26.0–34.0)
MCHC: 34.5 g/dL (ref 30.0–36.0)
MCV: 99.4 fL (ref 78.0–100.0)
Platelets: 238 10*3/uL (ref 150–400)
RBC: 3.27 MIL/uL — AB (ref 3.87–5.11)
RDW: 14.7 % (ref 11.5–15.5)
WBC: 7.8 10*3/uL (ref 4.0–10.5)

## 2016-07-12 LAB — I-STAT TROPONIN, ED: TROPONIN I, POC: 0 ng/mL (ref 0.00–0.08)

## 2016-07-12 LAB — LIPASE, BLOOD: LIPASE: 59 U/L — AB (ref 11–51)

## 2016-07-12 LAB — URINE MICROSCOPIC-ADD ON: WBC UA: NONE SEEN WBC/hpf (ref 0–5)

## 2016-07-12 LAB — TROPONIN I

## 2016-07-12 MED ORDER — IOPAMIDOL (ISOVUE-300) INJECTION 61%
100.0000 mL | Freq: Once | INTRAVENOUS | Status: AC | PRN
  Administered 2016-07-12: 100 mL via INTRAVENOUS

## 2016-07-12 MED ORDER — OXYCODONE-ACETAMINOPHEN 5-325 MG PO TABS
1.0000 | ORAL_TABLET | Freq: Once | ORAL | Status: AC
  Administered 2016-07-12: 1 via ORAL
  Filled 2016-07-12: qty 1

## 2016-07-12 MED ORDER — PROMETHAZINE HCL 25 MG/ML IJ SOLN
25.0000 mg | Freq: Once | INTRAMUSCULAR | Status: AC
  Administered 2016-07-12: 25 mg via INTRAVENOUS
  Filled 2016-07-12: qty 1

## 2016-07-12 MED ORDER — MORPHINE SULFATE (PF) 4 MG/ML IV SOLN
4.0000 mg | Freq: Once | INTRAVENOUS | Status: AC
Start: 2016-07-12 — End: 2016-07-12
  Administered 2016-07-12: 4 mg via INTRAVENOUS
  Filled 2016-07-12: qty 1

## 2016-07-12 MED ORDER — OXYCODONE-ACETAMINOPHEN 5-325 MG PO TABS: 1.0000 | ORAL_TABLET | Freq: Four times a day (QID) | ORAL | Status: DC | PRN

## 2016-07-12 MED ORDER — LORAZEPAM 1 MG PO TABS: 1.0000 mg | ORAL_TABLET | Freq: Three times a day (TID) | ORAL | Status: DC | PRN

## 2016-07-12 MED ORDER — SODIUM CHLORIDE 0.9 % IV BOLUS (SEPSIS)
1000.0000 mL | Freq: Once | INTRAVENOUS | Status: AC
  Administered 2016-07-12: 1000 mL via INTRAVENOUS

## 2016-07-12 MED ORDER — PROMETHAZINE HCL 25 MG RE SUPP: 25.0000 mg | Freq: Four times a day (QID) | RECTAL | Status: DC | PRN

## 2016-07-12 NOTE — ED Provider Notes (Signed)
CSN: [id number]     Arrival date & time 07/12/16  0325 History  By signing my name below, I, Brittney Cooper, attest that this documentation has been prepared under the direction and in the presence of Shon Batonourtney F Romelia Bromell, MD. Electronically Signed: Bethel BornBritney Cooper, ED Scribe. 07/12/2016. 3:55 AM   Chief Complaint  Patient presents with  . Nausea   The history is provided by the patient. No language interpreter was used.   Brittney Cooper is a 55 y.o. female with PMHx of alcoholic pancreatitis who presents to the Emergency Department complaining of ongoing nausea and vomiting with onset 6 days ago. Pt states that she was discharged from the hospital yesterday after being treated for alcoholic pancreatitis. Since that tie she has had multiple episodes of emesis, noting that she vomits every 15 minutes. Associated symptoms include ongoing but improved (7/10 in severity) abdominal pan, constipation, chest pain, and upper mid back pain. Prior to being admitted to the hospital the pt was on a daily drinking streak for 9 days but has had no alcohol over the last 4 days since being admitted and discharged.  She has no history of seizures with withdrawal.    Past Medical History  Diagnosis Date  . Bipolar disorder (HCC)   . Anxiety   . Panic attack   . Chronic pain     back  . OCD (obsessive compulsive disorder)   . ADHD (attention deficit hyperactivity disorder)   . Arthritis   . Hypertension   . Depression   . Fracture of lateral malleolus of right fibula   . Syncope     neurocardiogenic  . 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 Myringotomy    . Tonsillectomy    . Rhinoplasty    . Abdominal hysterectomy    . Anterior and posterior repair    . Finfger surgery    . Finger surgery    . Bunionectomy Bilateral   . Orif ankle  fracture Right 07/25/2015    Procedure: OPEN REDUCTION INTERNAL FIXATION (ORIF) RIGHT ANKLE ;  Surgeon: Sheral Apleyimothy D Murphy, MD;  Location: [facility];  Service: Orthopedics;  Laterality: Right;  . Ltcs     Family History  Problem Relation Age of Onset  . Cancer Father     stage IV lung  . Stroke Mother    Social History  Substance Use Topics  . Smoking status: Current Every Day Smoker -- 1.00 packs/day    Types: Cigarettes  . Smokeless tobacco: Never Used  . Alcohol Use: 0.0 oz/week    0 Standard drinks or equivalent per week     Comment: daily since last Wednesday   OB History    No data available     Review of Systems  Respiratory: Negative for shortness of breath.   Cardiovascular: Positive for chest pain.  Gastrointestinal: Positive for nausea, vomiting, abdominal pain and constipation.  Musculoskeletal: Positive for back pain.  All other systems reviewed and are negative.   Allergies  Codeine and Demerol  Home Medications   Prior to Admission medications   Medication Sig Start Date End Date Taking? Authorizing Provider  ALPRAZolam Prudy Feeler(XANAX) 0.5 MG tablet Take 0.5 mg by mouth daily. Take every day per patient   Yes Historical Provider, MD  amphetamine-dextroamphetamine (ADDERALL) 20 MG tablet Take  20 mg by mouth 3 (three) times daily.   Yes Historical Provider, MD  aspirin EC 81 MG tablet Take 81 mg by mouth daily.   Yes Historical Provider, MD  losartan (COZAAR) 100 MG tablet Take 100 mg by mouth daily.  06/12/14  Yes Historical Provider, MD  polyethylene glycol (MIRALAX / GLYCOLAX) packet Take 17 g by mouth daily as needed. Patient taking differently: Take 17 g by mouth daily as needed for mild constipation.  07/11/16  Yes Zannie Cove, MD  sertraline (ZOLOFT) 100 MG tablet Take 150 mg by mouth daily.    Yes Historical Provider, MD  traZODone (DESYREL) 50 MG tablet Take 100-150 mg by mouth at bedtime as needed. For sleep. 06/30/16  Yes Historical Provider,  MD  atorvastatin (LIPITOR) 40 MG tablet Take 40 mg by mouth daily.    Historical Provider, MD  Hydrocodone-Acetaminophen 5-300 MG TABS Take 1 tablet by mouth every 6 (six) hours as needed (for moderate to severe pain). Patient not taking: Reported on 07/12/2016 07/11/16   Zannie Cove, MD  LORazepam (ATIVAN) 1 MG tablet Take 1 tablet (1 mg total) by mouth every 8 (eight) hours as needed for anxiety. 07/12/16   Shon Baton, MD  oxyCODONE-acetaminophen (PERCOCET/ROXICET) 5-325 MG tablet Take 1 tablet by mouth every 6 (six) hours as needed for severe pain. 07/12/16   Shon Baton, MD  promethazine (PHENERGAN) 25 MG suppository Place 1 suppository (25 mg total) rectally every 6 (six) hours as needed for nausea or vomiting. 07/12/16   Shon Baton, MD   BP 165/107 mmHg  Pulse 80  Temp(Src) 98.3 F (36.8 C) (Oral)  Resp 18  SpO2 92% Physical Exam  Constitutional: She is oriented to person, place, and time. She appears well-developed and well-nourished. No distress.  HENT:  Head: Normocephalic and atraumatic.  Eyes: Pupils are equal, round, and reactive to light.  Cardiovascular: Normal rate, regular rhythm and normal heart sounds.   Pulmonary/Chest: Effort normal. No respiratory distress. She has no wheezes.  Abdominal: Soft. Bowel sounds are normal. There is tenderness. There is no rebound and no guarding.  Mild epigastric tenderness to palpation without rebound or guarding  Neurological: She is alert and oriented to person, place, and time.  Skin: Skin is warm and dry.  Psychiatric: She has a normal mood and affect.  Nursing note and vitals reviewed.   ED Course  Procedures (including critical care time) DIAGNOSTIC STUDIES: Oxygen Saturation is 92% on RA, adequate by my interpretation.    COORDINATION OF CARE: 3:52 AM Discussed treatment plan which includes lab work, abdominal XR, CXR, morphine, phenergan, and IVF with pt at bedside and pt agreed to plan.  Labs  Review Labs Reviewed  LIPASE, BLOOD - Abnormal; Notable for the following:    Lipase 59 (*)    All other components within normal limits  COMPREHENSIVE METABOLIC PANEL - Abnormal; Notable for the following:    Potassium 3.4 (*)    Glucose, Bld 136 (*)    Calcium 8.5 (*)    Albumin 3.4 (*)    AST 103 (*)    ALT 70 (*)    Alkaline Phosphatase 127 (*)    All other components within normal limits  CBC - Abnormal; Notable for the following:    RBC 3.27 (*)    Hemoglobin 11.2 (*)    HCT 32.5 (*)    MCH 34.3 (*)    All other components within normal limits  URINALYSIS, ROUTINE W REFLEX MICROSCOPIC (  NOT AT [facility]) - Abnormal; Notable for the following:    Hgb urine dipstick TRACE (*)    Ketones, ur 40 (*)    All other components within normal limits  URINE MICROSCOPIC-ADD ON - Abnormal; Notable for the following:    Squamous Epithelial / LPF 0-5 (*)    Bacteria, UA RARE (*)    All other components within normal limits  TROPONIN I  I-STAT TROPOININ, ED    Imaging Review Dg Abd Acute W/chest  07/12/2016  CLINICAL DATA:  Nausea and vomiting. EXAM: DG ABDOMEN ACUTE W/ 1V CHEST COMPARISON:  None. FINDINGS: The heart is borderline. The hila and mediastinum are unremarkable. Mild atelectasis in the bases. No pulmonary nodules, masses, or focal infiltrates. Small right effusion. No free air, portal venous gas, or pneumatosis. There is a paucity of bowel gas limiting evaluation but no evidence of obstruction. No other abnormalities identified within the abdomen. IMPRESSION: 1. Small right effusion and mild bibasilar atelectasis. 2. No acute abnormalities seen within the abdomen. Electronically Signed   By: Gerome Sam III M.D   On: 07/12/2016 05:10   I have personally reviewed and evaluated these images and lab results as part of my medical decision-making.   EKG Interpretation   Date/Time:  Monday July 12 2016 03:41:29 EDT Ventricular Rate:  77 PR Interval:    QRS Duration: 86 QT  Interval:  373 QTC Calculation: 420 R Axis:   33 Text Interpretation:  Sinus rhythm Borderline T abnormalities, diffuse  leads Confirmed by Rex Magee  MD, Osha Errico ([id number]) on 07/12/2016 5:51:45 AM      MDM   Final diagnoses:  Alcohol-induced chronic pancreatitis (HCC)  Elevated liver function tests    Patient presents with continued pain and vomiting. Recently admitted and discharged for pancreatitis. Reports recurrent vomiting at home. She is mildly tender on exam without signs of peritonitis. She additionally reports chest pain, shortness of breath. She is nontoxic on exam. Vital signs are reassuring. 40 ketones in the urine. Likely mild dehydration. LFTs are mildly elevated. During her recent admission she had an ultrasound that was reassuring. This could be related to her alcohol abuse. Lipase continues to improve. Troponin is negative. EKG is nonischemic. Chest x-ray shows a small right pleural effusion.   Patient is able to tolerate some fluids. Will discharge home with nausea medication and Percocet for pain.  7:32 AM At discharge, patient continuing to complain of vomiting. No objective significant vomiting noted. She also reports that she feels like she is withdrawn. Heart rate is normal blood pressure is normal. Given pleural effusion and persistent emesis, will obtain CT scan. If this is negative, discharge home as planned with perceptions provided.  I personally performed the services described in this documentation, which was scribed in my presence. The recorded information has been reviewed and is accurate.    Shon Baton, MD 07/12/16 [phone]

## 2016-07-12 NOTE — ED Notes (Signed)
Pt bib EMS d/t nausea/vomiting/chest pain/abdominal pain and back pain.  [facility].  Pt had quit drinking ETOH, d/t life stressors pt began drinking again which lead up to recent hospitalization.  Pt presents tonight because she cannot keep anything down since being released from the hospital.  The CP and back pain developed en route to the hospital.  NS per EMS.  Given 1 nitro tablet en route, 250 ml's of NS, 8 mg IV zofran, 324 mg asa. Pt reported to EMS PO zofran w/o relief.

## 2016-07-12 NOTE — ED Notes (Signed)
Bed: WA06 Expected date:  Expected time:  Means of arrival:  Comments: EMS: N/V seen already pancreatitis.

## 2016-07-12 NOTE — ED Notes (Signed)
Pt given water and crackers to test toleration.

## 2016-07-12 NOTE — ED Notes (Signed)
Pt given additional PO fluids per MD request. Pt tolerating well.

## 2016-07-12 NOTE — ED Notes (Signed)
Patient transported to CT 

## 2016-07-12 NOTE — ED Notes (Signed)
Pt is aware a urine sample is needed. 

## 2016-07-12 NOTE — Discharge Instructions (Signed)
You were seen today for pancreatitis.  Your lipase is improving. Her LFTs are mildly elevated but this may be result of your recent illness. He will be discharged home with a short course of pain medication and rectal suppositories for nausea.  Acute Pancreatitis Acute pancreatitis is a disease in which the pancreas becomes suddenly inflamed. The pancreas is a large gland located behind your stomach. The pancreas produces enzymes that help digest food. The pancreas also releases the hormones glucagon and insulin that help regulate blood sugar. Damage to the pancreas occurs when the digestive enzymes from the pancreas are activated and begin attacking the pancreas before being released into the intestine. Most acute attacks last a couple of days and can cause serious complications. Some people become dehydrated and develop low blood pressure. In severe cases, bleeding into the pancreas can lead to shock and can be life-threatening. The lungs, heart, and kidneys may fail. CAUSES  Pancreatitis can happen to anyone. In some cases, the cause is unknown. Most cases are caused by:  Alcohol abuse.  Gallstones. Other less common causes are:  Certain medicines.  Exposure to certain chemicals.  Infection.  Damage caused by an accident (trauma).  Abdominal surgery. SYMPTOMS   Pain in the upper abdomen that may radiate to the back.  Tenderness and swelling of the abdomen.  Nausea and vomiting. DIAGNOSIS  Your caregiver will perform a physical exam. Blood and stool tests may be done to confirm the diagnosis. Imaging tests may also be done, such as X-rays, CT scans, or an ultrasound of the abdomen. TREATMENT  Treatment usually requires a stay in the hospital. Treatment may include:  Pain medicine.  Fluid replacement through an intravenous line (IV).  Placing a tube in the stomach to remove stomach contents and control vomiting.  Not eating for 3 or 4 days. This gives your pancreas a rest,  because enzymes are not being produced that can cause further damage.  Antibiotic medicines if your condition is caused by an infection.  Surgery of the pancreas or gallbladder. HOME CARE INSTRUCTIONS   Follow the diet advised by your caregiver. This may involve avoiding alcohol and decreasing the amount of fat in your diet.  Eat smaller, more frequent meals. This reduces the amount of digestive juices the pancreas produces.  Drink enough fluids to keep your urine clear or pale yellow.  Only take over-the-counter or prescription medicines as directed by your caregiver.  Avoid drinking alcohol if it caused your condition.  Do not smoke.  Get plenty of rest.  Check your blood sugar at home as directed by your caregiver.  Keep all follow-up appointments as directed by your caregiver. SEEK MEDICAL CARE IF:   You do not recover as quickly as expected.  You develop new or worsening symptoms.  You have persistent pain, weakness, or nausea.  You recover and then have another episode of pain. SEEK IMMEDIATE MEDICAL CARE IF:   You are unable to eat or keep fluids down.  Your pain becomes severe.  You have a fever or persistent symptoms for more than 2 to 3 days.  You have a fever and your symptoms suddenly get worse.  Your skin or the white part of your eyes turn yellow (jaundice).  You develop vomiting.  You feel dizzy, or you faint.  Your blood sugar is high (over 300 mg/dL). MAKE SURE YOU:   Understand these instructions.  Will watch your condition.  Will get help right away if you are not doing  well or get worse.   This information is not intended to replace advice given to you by your health care provider. Make sure you discuss any questions you have with your health care provider.   Document Released: 12/13/2005 Document Revised: 06/13/2012 Document Reviewed: 03/23/2012 Elsevier Interactive Patient Education [employer]2016 [employer] Inc.

## 2016-07-14 ENCOUNTER — Encounter: Payer: Self-pay | Admitting: Physician Assistant

## 2016-07-14 ENCOUNTER — Encounter: Payer: Self-pay | Admitting: Internal Medicine

## 2016-08-03 ENCOUNTER — Ambulatory Visit: Payer: 59 | Admitting: Physician Assistant

## 2016-09-13 ENCOUNTER — Ambulatory Visit (INDEPENDENT_AMBULATORY_CARE_PROVIDER_SITE_OTHER): Payer: 59

## 2016-09-13 DIAGNOSIS — R55 Syncope and collapse: Secondary | ICD-10-CM | POA: Diagnosis not present

## 2016-09-14 ENCOUNTER — Encounter (HOSPITAL_BASED_OUTPATIENT_CLINIC_OR_DEPARTMENT_OTHER): Payer: Self-pay | Admitting: *Deleted

## 2016-09-14 ENCOUNTER — Emergency Department (HOSPITAL_BASED_OUTPATIENT_CLINIC_OR_DEPARTMENT_OTHER)
Admission: EM | Admit: 2016-09-14 | Discharge: 2016-09-15 | Disposition: A | Discharge status: Home / Self Care | Payer: 59 | Source: Home / Self Care | Attending: Emergency Medicine | Admitting: Emergency Medicine

## 2016-09-14 DIAGNOSIS — Z79899 Other long term (current) drug therapy: Secondary | ICD-10-CM | POA: Insufficient documentation

## 2016-09-14 DIAGNOSIS — K852 Alcohol induced acute pancreatitis without necrosis or infection: Secondary | ICD-10-CM

## 2016-09-14 DIAGNOSIS — F909 Attention-deficit hyperactivity disorder, unspecified type: Secondary | ICD-10-CM

## 2016-09-14 DIAGNOSIS — I10 Essential (primary) hypertension: Secondary | ICD-10-CM

## 2016-09-14 DIAGNOSIS — R1013 Epigastric pain: Secondary | ICD-10-CM | POA: Diagnosis not present

## 2016-09-14 DIAGNOSIS — K859 Acute pancreatitis without necrosis or infection, unspecified: Secondary | ICD-10-CM | POA: Diagnosis not present

## 2016-09-14 DIAGNOSIS — Z7982 Long term (current) use of aspirin: Secondary | ICD-10-CM | POA: Insufficient documentation

## 2016-09-14 DIAGNOSIS — F17210 Nicotine dependence, cigarettes, uncomplicated: Secondary | ICD-10-CM

## 2016-09-14 LAB — COMPREHENSIVE METABOLIC PANEL
ALK PHOS: 103 U/L (ref 38–126)
ALT: 61 U/L — ABNORMAL HIGH (ref 14–54)
ANION GAP: 14 (ref 5–15)
AST: 56 U/L — ABNORMAL HIGH (ref 15–41)
Albumin: 5.2 g/dL — ABNORMAL HIGH (ref 3.5–5.0)
BILIRUBIN TOTAL: 1.2 mg/dL (ref 0.3–1.2)
BUN: 23 mg/dL — ABNORMAL HIGH (ref 6–20)
CALCIUM: 9.7 mg/dL (ref 8.9–10.3)
CO2: 24 mmol/L (ref 22–32)
Chloride: 99 mmol/L — ABNORMAL LOW (ref 101–111)
Creatinine, Ser: 0.75 mg/dL (ref 0.44–1.00)
Glucose, Bld: 154 mg/dL — ABNORMAL HIGH (ref 65–99)
POTASSIUM: 3.7 mmol/L (ref 3.5–5.1)
Sodium: 137 mmol/L (ref 135–145)
TOTAL PROTEIN: 8.2 g/dL — AB (ref 6.5–8.1)

## 2016-09-14 LAB — CBC
HCT: 43.8 % (ref 36.0–46.0)
HEMOGLOBIN: 15.5 g/dL — AB (ref 12.0–15.0)
MCH: 34.9 pg — ABNORMAL HIGH (ref 26.0–34.0)
MCHC: 35.4 g/dL (ref 30.0–36.0)
MCV: 98.6 fL (ref 78.0–100.0)
Platelets: 314 10*3/uL (ref 150–400)
RBC: 4.44 MIL/uL (ref 3.87–5.11)
RDW: 14 % (ref 11.5–15.5)
WBC: 12.5 10*3/uL — AB (ref 4.0–10.5)

## 2016-09-14 LAB — LIPASE, BLOOD: Lipase: 334 U/L — ABNORMAL HIGH (ref 11–51)

## 2016-09-14 MED ORDER — HYDROMORPHONE HCL 1 MG/ML IJ SOLN
1.0000 mg | Freq: Once | INTRAMUSCULAR | Status: AC
  Administered 2016-09-14: 1 mg via INTRAVENOUS
  Filled 2016-09-14: qty 1

## 2016-09-14 MED ORDER — HYDROMORPHONE HCL 1 MG/ML IJ SOLN
2.0000 mg | Freq: Once | INTRAMUSCULAR | Status: AC
  Administered 2016-09-14: 2 mg via INTRAVENOUS
  Filled 2016-09-14: qty 2

## 2016-09-14 MED ORDER — ONDANSETRON 4 MG PO TBDP
4.0000 mg | ORAL_TABLET | Freq: Once | ORAL | Status: AC
  Administered 2016-09-14: 4 mg via ORAL
  Filled 2016-09-14: qty 1

## 2016-09-14 MED ORDER — ONDANSETRON HCL 4 MG/2ML IJ SOLN
4.0000 mg | Freq: Once | INTRAMUSCULAR | Status: AC
  Administered 2016-09-14: 4 mg via INTRAVENOUS
  Filled 2016-09-14: qty 2

## 2016-09-14 MED ORDER — SODIUM CHLORIDE 0.9 % IV BOLUS (SEPSIS)
2000.0000 mL | Freq: Once | INTRAVENOUS | Status: AC
  Administered 2016-09-14: 2000 mL via INTRAVENOUS

## 2016-09-14 NOTE — ED Triage Notes (Signed)
Pt c/o abd pain x 2 days . HX pancreatitis

## 2016-09-14 NOTE — ED Provider Notes (Addendum)
[facility] Provider [facility]   CSN: [id number] Arrival date & time: 09/14/16  1736  By signing my name below, I, Modena Jansky, attest that this documentation has been prepared under the direction and in the presence of Derwood Kaplan, MD . Electronically Signed: Modena Jansky, Scribe. 09/14/2016. 7:35 PM.  History   Chief Complaint Chief Complaint  Patient presents with  . Abdominal Pain   The history is provided by the patient and medical records. No language interpreter was used.   HPI Comments: Brittney Cooper is a 55 y.o. female with a hx of alcoholic pancreatitis who presents to the Emergency Department complaining of constant moderate upper abdominal pain that started 2 days ago. Per medical record, pt was dx on 07/08/16 during a visit to the [facility] ED and admitted for 3 nights. Returned to the ED on 07/12/16 for similar complaint. She states she stopped drinking since then, but had two drinks 2 days ago. Scheduled to see a gastroenterologist in 3 days. She describes the pain as an 8/10 with no modifying factors. She had 15-20 episodes of vomiting today. Symptoms were unrelieved by tylenol, motrin, percocet, and phenergan taken today. Denies any other complaints.   Past Medical History:  Diagnosis Date  . ADHD (attention deficit hyperactivity disorder)   . 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 Anxiety   . Arthritis   . Bipolar disorder (HCC)   . Chronic pain    back  . Depression   . Fatty liver   . Fracture of lateral malleolus of right fibula   . Hypertension   . OCD (obsessive compulsive disorder)   . Panic attack   . 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 Syncope    neurocardiogenic    Patient Active Problem List   Diagnosis Date Noted  . Pancreatitis 07/09/2016  . Alcoholic pancreatitis 07/08/2016  . Alcohol withdrawal (HCC) 07/08/2016  . Bipolar disorder (HCC) 07/08/2016  .  Essential hypertension 07/08/2016  . Left shoulder pain 09/09/2014    Past Surgical History:  Procedure Laterality Date  . ABDOMINAL HYSTERECTOMY    . ANTERIOR AND POSTERIOR REPAIR    . BUNIONECTOMY Bilateral   . finfger surgery    . FINGER SURGERY    . LTCS    . MYRINGOTOMY    . ORIF ANKLE FRACTURE Right 07/25/2015   Procedure: OPEN REDUCTION INTERNAL FIXATION (ORIF) RIGHT ANKLE ;  Surgeon: Sheral Apley, MD;  Location: [facility];  Service: Orthopedics;  Laterality: Right;  . RHINOPLASTY    . TONSILLECTOMY      OB History    No data available       Home Medications    Prior to Admission medications   Medication Sig Start Date End Date Taking? Authorizing Provider  ALPRAZolam Prudy Feeler) 0.5 MG tablet Take 0.5 mg by mouth daily. Take every day per patient    Historical Provider, MD  amphetamine-dextroamphetamine (ADDERALL) 20 MG tablet Take 20 mg by mouth 3 (three) times daily.    Historical Provider, MD  aspirin EC 81 MG tablet Take 81 mg by mouth daily.    Historical Provider, MD  atorvastatin (LIPITOR) 40 MG tablet Take 40 mg by mouth daily.    Historical Provider, MD  Hydrocodone-Acetaminophen 5-300 MG TABS Take 1 tablet by mouth every 6 (six) hours as needed (for moderate to severe pain). Patient not taking: Reported on 07/12/2016  07/11/16   Zannie Cove, MD  LORazepam (ATIVAN) 1 MG tablet Take 1 tablet (1 mg total) by mouth every 8 (eight) hours as needed for anxiety. 07/12/16   Shon Baton, MD  losartan (COZAAR) 100 MG tablet Take 100 mg by mouth daily.  06/12/14   Historical Provider, MD  ondansetron (ZOFRAN ODT) 8 MG disintegrating tablet Take 1 tablet (8 mg total) by mouth every 8 (eight) hours as needed for nausea. 09/15/16   Derwood Kaplan, MD  oxyCODONE-acetaminophen (PERCOCET/ROXICET) 5-325 MG tablet Take 1 tablet by mouth every 8 (eight) hours as needed for severe pain. 09/15/16   Derwood Kaplan, MD  polyethylene glycol (MIRALAX / GLYCOLAX) packet  Take 17 g by mouth daily as needed. Patient taking differently: Take 17 g by mouth daily as needed for mild constipation.  07/11/16   Zannie Cove, MD  promethazine (PHENERGAN) 25 MG suppository Place 1 suppository (25 mg total) rectally every 6 (six) hours as needed for nausea or vomiting. 07/12/16   Shon Baton, MD  sertraline (ZOLOFT) 100 MG tablet Take 150 mg by mouth daily.     Historical Provider, MD  traZODone (DESYREL) 50 MG tablet Take 100-150 mg by mouth at bedtime as needed. For sleep. 06/30/16   Historical Provider, MD    Family History Family History  Problem Relation Age of Onset  . Stroke Mother   . Cancer Father     stage IV lung    Social History Social History  Substance Use Topics  . Smoking status: Current Every Day Smoker    Packs/day: 0.50    Types: Cigarettes  . Smokeless tobacco: Never Used  . Alcohol use 0.0 oz/week     Comment: daily since last Wednesday     Allergies   Codeine and Demerol [meperidine]   Review of Systems Review of Systems 10 Systems reviewed and all are negative for acute change except as noted in the HPI.  Physical Exam Updated Vital Signs BP 160/100   Pulse 98   Temp 97.9 F (36.6 C) (Oral)   Resp 16   Ht 5\' 4"  (1.626 m)   Wt 200 lb (90.7 kg)   SpO2 99%   BMI 34.33 kg/m   Physical Exam  Constitutional: She is oriented to person, place, and time. She appears well-developed and well-nourished. No distress.  HENT:  Head: Normocephalic and atraumatic.  Right Ear: External ear normal.  Left Ear: External ear normal.  Eyes: Conjunctivae are normal. Right eye exhibits no discharge. Left eye exhibits no discharge. No scleral icterus.  Sclera clear.    Neck: Neck supple. No tracheal deviation present.  Cardiovascular: Normal rate and regular rhythm.   Pulmonary/Chest: Effort normal. No stridor. No respiratory distress. She has no wheezes. She has no rales.  Abdominal: Soft. She exhibits no distension. There is  tenderness. There is guarding.  Generalized TTP in the upper abdominal area, worse in the epigastric area. Voluntary guarding.   Musculoskeletal: She exhibits no edema.  Neurological: She is alert and oriented to person, place, and time. Cranial nerve deficit: no gross deficits.  Skin: Skin is warm and dry. No rash noted.  Psychiatric: She has a normal mood and affect.  Nursing note and vitals reviewed.    ED Treatments / Results  DIAGNOSTIC STUDIES: Oxygen Saturation is 99% on RA, normal by my interpretation.    COORDINATION OF CARE: 7:39 PM- Pt advised of plan for treatment and pt agrees.  Labs (all labs ordered are listed, but  only abnormal results are displayed) Labs Reviewed  LIPASE, BLOOD - Abnormal; Notable for the following:       Result Value   Lipase 334 (*)    All other components within normal limits  COMPREHENSIVE METABOLIC PANEL - Abnormal; Notable for the following:    Chloride 99 (*)    Glucose, Bld 154 (*)    BUN 23 (*)    Total Protein 8.2 (*)    Albumin 5.2 (*)    AST 56 (*)    ALT 61 (*)    All other components within normal limits  CBC - Abnormal; Notable for the following:    WBC 12.5 (*)    Hemoglobin 15.5 (*)    MCH 34.9 (*)    All other components within normal limits    EKG  EKG Interpretation None       Radiology No results found.  Procedures Procedures (including critical care time)  Medications Ordered in ED Medications  sodium chloride 0.9 % bolus 2,000 mL (0 mLs Intravenous Stopped 09/14/16 2112)  HYDROmorphone (DILAUDID) injection 1 mg (1 mg Intravenous Given 09/14/16 1953)  ondansetron (ZOFRAN) injection 4 mg (4 mg Intravenous Given 09/14/16 1953)  HYDROmorphone (DILAUDID) injection 2 mg (2 mg Intravenous Given 09/14/16 2307)  ondansetron (ZOFRAN-ODT) disintegrating tablet 4 mg (4 mg Oral Given 09/14/16 2306)     Initial Impression / Assessment and Plan / ED Course  I have reviewed the triage vital signs and the nursing  notes.  Pertinent labs & imaging results that were available during my care of the patient were reviewed by me and considered in my medical decision making (see chart for details).  Clinical Course  Comment By Time  Pt still having abd pain. Nausea is better. Labs are overall reassuring, expected elevated lipase,LFTs, elevated WC. Will give more iv dilaudid. We will reassess. Anticipate d/c at this time if the pain is controlled better. Derwood KaplanAnkit Zoa Dowty, MD 09/19 2301  Pt's pain has improved on reassessment and she is comfortable going home. No emesis. Requests ODT zofran. Strict ER return precautions have been discussed, and patient is agreeing with the plan and is comfortable with the workup done and the recommendations from the ER. Derwood KaplanAnkit Zoe Goonan, MD 09/20 0005    DDx includes: Pancreatitis Hepatobiliary pathology including cholecystitis Gastritis/PUD SBO  Pt with hx of pancreatitis and pseudocyst comes in with cc of abd pain. CT scan from July reviewed. Abd pain was evoked by alcohol use.  Pt's pain is similar to her pancreatitis. Pt is non toxic. We will get basic labs and repeat assessment.    Final Clinical Impressions(s) / ED Diagnoses   Final diagnoses:  Pancreatitis, alcoholic, acute    New Prescriptions Discharge Medication List as of 09/15/2016 12:05 AM    START taking these medications   Details  ondansetron (ZOFRAN ODT) 8 MG disintegrating tablet Take 1 tablet (8 mg total) by mouth every 8 (eight) hours as needed for nausea., Starting Wed 09/15/2016, Print       I personally performed the services described in this documentation, which was scribed in my presence. The recorded information has been reviewed and is accurate.     Derwood KaplanAnkit Lilyrose Tanney, MD 09/14/16 [id number]    Derwood KaplanAnkit Keahi Mccarney, MD 09/15/16 [id number]

## 2016-09-15 ENCOUNTER — Inpatient Hospital Stay (HOSPITAL_COMMUNITY)
Admission: EM | Admit: 2016-09-15 | Discharge: 2016-09-18 | DRG: 440 | Disposition: A | Discharge status: Home / Self Care | Payer: 59 | Attending: Internal Medicine | Admitting: Internal Medicine

## 2016-09-15 ENCOUNTER — Emergency Department (HOSPITAL_COMMUNITY): Payer: 59

## 2016-09-15 ENCOUNTER — Encounter (HOSPITAL_COMMUNITY): Payer: Self-pay | Admitting: Emergency Medicine

## 2016-09-15 DIAGNOSIS — F319 Bipolar disorder, unspecified: Secondary | ICD-10-CM | POA: Diagnosis present

## 2016-09-15 DIAGNOSIS — F4310 Post-traumatic stress disorder, unspecified: Secondary | ICD-10-CM | POA: Diagnosis present

## 2016-09-15 DIAGNOSIS — F1010 Alcohol abuse, uncomplicated: Secondary | ICD-10-CM | POA: Diagnosis present

## 2016-09-15 DIAGNOSIS — F3170 Bipolar disorder, currently in remission, most recent episode unspecified: Secondary | ICD-10-CM | POA: Diagnosis not present

## 2016-09-15 DIAGNOSIS — K859 Acute pancreatitis without necrosis or infection, unspecified: Principal | ICD-10-CM | POA: Diagnosis present

## 2016-09-15 DIAGNOSIS — M549 Dorsalgia, unspecified: Secondary | ICD-10-CM | POA: Diagnosis present

## 2016-09-15 DIAGNOSIS — I10 Essential (primary) hypertension: Secondary | ICD-10-CM | POA: Diagnosis present

## 2016-09-15 DIAGNOSIS — Z90710 Acquired absence of both cervix and uterus: Secondary | ICD-10-CM

## 2016-09-15 DIAGNOSIS — R748 Abnormal levels of other serum enzymes: Secondary | ICD-10-CM | POA: Diagnosis present

## 2016-09-15 DIAGNOSIS — E876 Hypokalemia: Secondary | ICD-10-CM | POA: Diagnosis not present

## 2016-09-15 DIAGNOSIS — Z7982 Long term (current) use of aspirin: Secondary | ICD-10-CM

## 2016-09-15 DIAGNOSIS — F429 Obsessive-compulsive disorder, unspecified: Secondary | ICD-10-CM | POA: Diagnosis present

## 2016-09-15 DIAGNOSIS — Z79899 Other long term (current) drug therapy: Secondary | ICD-10-CM | POA: Diagnosis not present

## 2016-09-15 DIAGNOSIS — F909 Attention-deficit hyperactivity disorder, unspecified type: Secondary | ICD-10-CM | POA: Diagnosis present

## 2016-09-15 DIAGNOSIS — Z800 Family history of malignant neoplasm of digestive organs: Secondary | ICD-10-CM | POA: Diagnosis not present

## 2016-09-15 DIAGNOSIS — F419 Anxiety disorder, unspecified: Secondary | ICD-10-CM | POA: Diagnosis present

## 2016-09-15 DIAGNOSIS — K760 Fatty (change of) liver, not elsewhere classified: Secondary | ICD-10-CM | POA: Diagnosis present

## 2016-09-15 DIAGNOSIS — Z885 Allergy status to narcotic agent status: Secondary | ICD-10-CM | POA: Diagnosis not present

## 2016-09-15 DIAGNOSIS — Z841 Family history of disorders of kidney and ureter: Secondary | ICD-10-CM

## 2016-09-15 DIAGNOSIS — F17210 Nicotine dependence, cigarettes, uncomplicated: Secondary | ICD-10-CM | POA: Diagnosis present

## 2016-09-15 DIAGNOSIS — Z823 Family history of stroke: Secondary | ICD-10-CM | POA: Diagnosis not present

## 2016-09-15 DIAGNOSIS — Z720 Tobacco use: Secondary | ICD-10-CM | POA: Diagnosis present

## 2016-09-15 DIAGNOSIS — K8521 Alcohol induced acute pancreatitis with uninfected necrosis: Secondary | ICD-10-CM

## 2016-09-15 DIAGNOSIS — K852 Alcohol induced acute pancreatitis without necrosis or infection: Secondary | ICD-10-CM | POA: Diagnosis not present

## 2016-09-15 DIAGNOSIS — G8929 Other chronic pain: Secondary | ICD-10-CM | POA: Diagnosis present

## 2016-09-15 DIAGNOSIS — R1013 Epigastric pain: Secondary | ICD-10-CM | POA: Diagnosis present

## 2016-09-15 LAB — CBC WITH DIFFERENTIAL/PLATELET
BASOS PCT: 0 %
Basophils Absolute: 0 10*3/uL (ref 0.0–0.1)
Eosinophils Absolute: 0 10*3/uL (ref 0.0–0.7)
Eosinophils Relative: 0 %
HEMATOCRIT: 44 % (ref 36.0–46.0)
HEMOGLOBIN: 15.6 g/dL — AB (ref 12.0–15.0)
LYMPHS ABS: 0.7 10*3/uL (ref 0.7–4.0)
Lymphocytes Relative: 4 %
MCH: 34.9 pg — ABNORMAL HIGH (ref 26.0–34.0)
MCHC: 35.5 g/dL (ref 30.0–36.0)
MCV: 98.4 fL (ref 78.0–100.0)
MONOS PCT: 5 %
Monocytes Absolute: 0.8 10*3/uL (ref 0.1–1.0)
NEUTROS ABS: 15.3 10*3/uL — AB (ref 1.7–7.7)
NEUTROS PCT: 91 %
Platelets: 300 10*3/uL (ref 150–400)
RBC: 4.47 MIL/uL (ref 3.87–5.11)
RDW: 14.4 % (ref 11.5–15.5)
WBC: 16.9 10*3/uL — ABNORMAL HIGH (ref 4.0–10.5)

## 2016-09-15 LAB — COMPREHENSIVE METABOLIC PANEL
ALBUMIN: 5.1 g/dL — AB (ref 3.5–5.0)
ALT: 52 U/L (ref 14–54)
ANION GAP: 13 (ref 5–15)
AST: 37 U/L (ref 15–41)
Alkaline Phosphatase: 104 U/L (ref 38–126)
BUN: 13 mg/dL (ref 6–20)
CHLORIDE: 98 mmol/L — AB (ref 101–111)
CO2: 23 mmol/L (ref 22–32)
Calcium: 8.9 mg/dL (ref 8.9–10.3)
Creatinine, Ser: 0.62 mg/dL (ref 0.44–1.00)
GFR calc Af Amer: >60 mL/min (ref >60)
GFR calc non Af Amer: >60 mL/min (ref >60)
GLUCOSE: 173 mg/dL — AB (ref 65–99)
POTASSIUM: 3.3 mmol/L — AB (ref 3.5–5.1)
SODIUM: 134 mmol/L — AB (ref 135–145)
TOTAL PROTEIN: 8.5 g/dL — AB (ref 6.5–8.1)
Total Bilirubin: 1 mg/dL (ref 0.3–1.2)

## 2016-09-15 LAB — LIPASE, BLOOD: LIPASE: 371 U/L — AB (ref 11–51)

## 2016-09-15 LAB — ETHANOL: Alcohol, Ethyl (B): <5 mg/dL (ref <5)

## 2016-09-15 MED ORDER — HYDROMORPHONE HCL 1 MG/ML IJ SOLN: 0.5000 mg | INTRAMUSCULAR | Status: DC | PRN

## 2016-09-15 MED ORDER — HYDROMORPHONE HCL 1 MG/ML IJ SOLN
1.0000 mg | Freq: Once | INTRAMUSCULAR | Status: AC
  Administered 2016-09-15: 1 mg via INTRAVENOUS
  Filled 2016-09-15: qty 1

## 2016-09-15 MED ORDER — LOSARTAN POTASSIUM 50 MG PO TABS: 100.0000 mg | ORAL_TABLET | Freq: Every day | ORAL | Status: DC

## 2016-09-15 MED ORDER — HYDROMORPHONE HCL 1 MG/ML IJ SOLN
1.0000 mg | Freq: Once | INTRAMUSCULAR | Status: AC
  Administered 2016-09-15: 1 mg via INTRAVENOUS
  Filled 2016-09-15 (×2): qty 1

## 2016-09-15 MED ORDER — ALPRAZOLAM 0.25 MG PO TABS
0.2500 mg | ORAL_TABLET | Freq: Every day | ORAL | Status: DC
  Administered 2016-09-16 – 2016-09-18 (×3): 0.25 mg via ORAL
  Filled 2016-09-15 (×3): qty 1

## 2016-09-15 MED ORDER — ASPIRIN EC 81 MG PO TBEC
81.0000 mg | DELAYED_RELEASE_TABLET | Freq: Every day | ORAL | Status: DC
  Administered 2016-09-16 – 2016-09-18 (×3): 81 mg via ORAL
  Filled 2016-09-15 (×3): qty 1

## 2016-09-15 MED ORDER — POLYETHYLENE GLYCOL 3350 17 G PO PACK
17.0000 g | PACK | Freq: Every day | ORAL | Status: DC | PRN
  Administered 2016-09-15 – 2016-09-17 (×3): 17 g via ORAL
  Filled 2016-09-15 (×3): qty 1

## 2016-09-15 MED ORDER — SERTRALINE HCL 50 MG PO TABS
150.0000 mg | ORAL_TABLET | Freq: Every day | ORAL | Status: DC
  Administered 2016-09-15 – 2016-09-18 (×4): 150 mg via ORAL
  Filled 2016-09-15 (×4): qty 3

## 2016-09-15 MED ORDER — LORAZEPAM 2 MG/ML IJ SOLN: 1.0000 mg | Freq: Four times a day (QID) | INTRAMUSCULAR | Status: DC | PRN

## 2016-09-15 MED ORDER — ATORVASTATIN CALCIUM 40 MG PO TABS
40.0000 mg | ORAL_TABLET | Freq: Every day | ORAL | Status: DC
  Administered 2016-09-16 – 2016-09-18 (×3): 40 mg via ORAL
  Filled 2016-09-15 (×3): qty 1

## 2016-09-15 MED ORDER — IOPAMIDOL (ISOVUE-300) INJECTION 61%
100.0000 mL | Freq: Once | INTRAVENOUS | Status: AC | PRN
  Administered 2016-09-15: 100 mL via INTRAVENOUS

## 2016-09-15 MED ORDER — HEPARIN SODIUM (PORCINE) 5000 UNIT/ML IJ SOLN
5000.0000 [IU] | Freq: Three times a day (TID) | INTRAMUSCULAR | Status: DC
  Administered 2016-09-16: 5000 [IU] via SUBCUTANEOUS
  Filled 2016-09-15 (×2): qty 1

## 2016-09-15 MED ORDER — FOLIC ACID 1 MG PO TABS
1.0000 mg | ORAL_TABLET | Freq: Every day | ORAL | Status: DC
  Administered 2016-09-15 – 2016-09-18 (×4): 1 mg via ORAL
  Filled 2016-09-15 (×4): qty 1

## 2016-09-15 MED ORDER — HYDRALAZINE HCL 20 MG/ML IJ SOLN
5.0000 mg | Freq: Four times a day (QID) | INTRAMUSCULAR | Status: DC | PRN
  Administered 2016-09-15: 5 mg via INTRAVENOUS
  Filled 2016-09-15: qty 1

## 2016-09-15 MED ORDER — NICOTINE 14 MG/24HR TD PT24
14.0000 mg | MEDICATED_PATCH | TRANSDERMAL | Status: DC
  Administered 2016-09-15 – 2016-09-17 (×3): 14 mg via TRANSDERMAL
  Filled 2016-09-15 (×3): qty 1

## 2016-09-15 MED ORDER — ADULT MULTIVITAMIN W/MINERALS CH
1.0000 | ORAL_TABLET | Freq: Every day | ORAL | Status: DC
  Administered 2016-09-15 – 2016-09-18 (×4): 1 via ORAL
  Filled 2016-09-15 (×5): qty 1

## 2016-09-15 MED ORDER — SODIUM CHLORIDE 0.9 % IV BOLUS (SEPSIS)
1000.0000 mL | Freq: Once | INTRAVENOUS | Status: AC
  Administered 2016-09-15: 1000 mL via INTRAVENOUS

## 2016-09-15 MED ORDER — POTASSIUM CHLORIDE IN NACL 20-0.9 MEQ/L-% IV SOLN
INTRAVENOUS | Status: AC
  Administered 2016-09-15 – 2016-09-16 (×3): via INTRAVENOUS
  Filled 2016-09-15 (×5): qty 1000

## 2016-09-15 MED ORDER — LOSARTAN POTASSIUM 50 MG PO TABS
100.0000 mg | ORAL_TABLET | Freq: Every day | ORAL | Status: DC
  Administered 2016-09-16 – 2016-09-18 (×3): 100 mg via ORAL
  Filled 2016-09-15 (×3): qty 2

## 2016-09-15 MED ORDER — TRAZODONE HCL 100 MG PO TABS
100.0000 mg | ORAL_TABLET | Freq: Every evening | ORAL | Status: DC | PRN
  Administered 2016-09-17: 150 mg via ORAL
  Filled 2016-09-15: qty 2

## 2016-09-15 MED ORDER — OXYCODONE-ACETAMINOPHEN 5-325 MG PO TABS: 1.0000 | ORAL_TABLET | Freq: Three times a day (TID) | ORAL | 0 refills | Status: DC | PRN

## 2016-09-15 MED ORDER — IOPAMIDOL (ISOVUE-300) INJECTION 61%
30.0000 mL | Freq: Once | INTRAVENOUS | Status: AC | PRN
  Administered 2016-09-15: 15 mL via ORAL

## 2016-09-15 MED ORDER — HYDROMORPHONE HCL 1 MG/ML IJ SOLN
1.0000 mg | INTRAMUSCULAR | Status: DC | PRN
  Administered 2016-09-15 – 2016-09-18 (×15): 1 mg via INTRAVENOUS
  Filled 2016-09-15 (×16): qty 1

## 2016-09-15 MED ORDER — LORAZEPAM 1 MG PO TABS
1.0000 mg | ORAL_TABLET | Freq: Four times a day (QID) | ORAL | Status: DC | PRN
  Administered 2016-09-15 – 2016-09-17 (×3): 1 mg via ORAL
  Filled 2016-09-15 (×3): qty 1

## 2016-09-15 MED ORDER — METOCLOPRAMIDE HCL 5 MG/ML IJ SOLN
10.0000 mg | Freq: Once | INTRAMUSCULAR | Status: AC
  Administered 2016-09-15: 10 mg via INTRAVENOUS
  Filled 2016-09-15: qty 2

## 2016-09-15 MED ORDER — THIAMINE HCL 100 MG/ML IJ SOLN
100.0000 mg | Freq: Every day | INTRAMUSCULAR | Status: DC
  Administered 2016-09-15: 100 mg via INTRAVENOUS
  Filled 2016-09-15: qty 2

## 2016-09-15 MED ORDER — ONDANSETRON 8 MG PO TBDP: 8.0000 mg | ORAL_TABLET | Freq: Three times a day (TID) | ORAL | 0 refills | Status: DC | PRN

## 2016-09-15 MED ORDER — OXYCODONE HCL 5 MG PO TABS
5.0000 mg | ORAL_TABLET | ORAL | Status: DC | PRN
  Administered 2016-09-15 – 2016-09-18 (×13): 5 mg via ORAL
  Filled 2016-09-15 (×13): qty 1

## 2016-09-15 MED ORDER — ONDANSETRON HCL 4 MG PO TABS: 4.0000 mg | ORAL_TABLET | Freq: Four times a day (QID) | ORAL | Status: DC | PRN

## 2016-09-15 MED ORDER — VITAMIN B-1 100 MG PO TABS
100.0000 mg | ORAL_TABLET | Freq: Every day | ORAL | Status: DC
  Administered 2016-09-16 – 2016-09-18 (×3): 100 mg via ORAL
  Filled 2016-09-15 (×3): qty 1

## 2016-09-15 MED ORDER — ONDANSETRON HCL 4 MG/2ML IJ SOLN
4.0000 mg | Freq: Four times a day (QID) | INTRAMUSCULAR | Status: DC | PRN
Start: 2016-09-15 — End: 2016-09-18
  Administered 2016-09-15: 4 mg via INTRAVENOUS
  Filled 2016-09-15: qty 2

## 2016-09-15 NOTE — ED Provider Notes (Signed)
[facility]-EMERGENCY DEPT Provider Note   CSN: [id number] Arrival date & time: 09/15/16  0747     History   Chief Complaint Chief Complaint  Patient presents with  . Abdominal Pain    HPI Brittney Cooper is a 55 y.o. female.  HPI Patient presents one day after being seen for similar concerns at an affiliated facility. She notes over the past 4 days she has had persistent his comfort in the epigastrium, right upper quadrant, right flank. Pain is sore, severe, crampy, with associated nausea, vomiting, anorexia. No diarrhea, no fever, no syncope, no other chest pain. Patient acknowledges a history of alcoholic pancreatitis, states that she had alcohol the day prior to onset of symptoms, but had previously been abstaining from alcohol for some time.  Past Medical History:  Diagnosis Date  . ADHD (attention deficit hyperactivity disorder)   . 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 Anxiety   . Arthritis   . Bipolar disorder (HCC)   . Chronic pain    back  . Depression   . Fatty liver   . Fracture of lateral malleolus of right fibula   . Hypertension   . OCD (obsessive compulsive disorder)   . Panic attack   . 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 Syncope    neurocardiogenic    Patient Active Problem List   Diagnosis Date Noted  . Pancreatitis 07/09/2016  . Alcoholic pancreatitis 07/08/2016  . Alcohol withdrawal (HCC) 07/08/2016  . Bipolar disorder (HCC) 07/08/2016  . Essential hypertension 07/08/2016  . Left shoulder pain 09/09/2014    Past Surgical History:  Procedure Laterality Date  . ABDOMINAL HYSTERECTOMY    . ANTERIOR AND POSTERIOR REPAIR    . BUNIONECTOMY Bilateral   . finfger surgery    . FINGER SURGERY    . LTCS    . MYRINGOTOMY    . ORIF ANKLE FRACTURE Right 07/25/2015   Procedure: OPEN REDUCTION INTERNAL FIXATION (ORIF) RIGHT ANKLE ;  Surgeon: Sheral Apley, MD;  Location:  [facility];  Service: Orthopedics;  Laterality: Right;  . RHINOPLASTY    . TONSILLECTOMY      OB History    No data available       Home Medications    Prior to Admission medications   Medication Sig Start Date End Date Taking? Authorizing Provider  ALPRAZolam Prudy Feeler) 0.5 MG tablet Take 0.25 mg by mouth daily. Take every day per patient    Yes Historical Provider, MD  amphetamine-dextroamphetamine (ADDERALL) 20 MG tablet Take 20 mg by mouth 3 (three) times daily.   Yes Historical Provider, MD  aspirin EC 81 MG tablet Take 81 mg by mouth daily.   Yes Historical Provider, MD  atorvastatin (LIPITOR) 40 MG tablet Take 40 mg by mouth daily.   Yes Historical Provider, MD  losartan (COZAAR) 100 MG tablet Take 100 mg by mouth daily.  06/12/14  Yes Historical Provider, MD  oxyCODONE-acetaminophen (PERCOCET/ROXICET) 5-325 MG tablet Take 1 tablet by mouth every 8 (eight) hours as needed for severe pain. 09/15/16  Yes Derwood Kaplan, MD  promethazine (PHENERGAN) 25 MG suppository Place 1 suppository (25 mg total) rectally every 6 (six) hours as needed for nausea or vomiting. 07/12/16  Yes Shon Baton, MD  sertraline (ZOLOFT) 100 MG tablet Take 150 mg by mouth daily.    Yes Historical Provider, MD  traZODone (DESYREL) 50 MG tablet Take  100-150 mg by mouth at bedtime as needed for sleep. For sleep. 06/30/16  Yes Historical Provider, MD  Hydrocodone-Acetaminophen 5-300 MG TABS Take 1 tablet by mouth every 6 (six) hours as needed (for moderate to severe pain). Patient not taking: Reported on 07/12/2016 07/11/16   Zannie CovePreetha Joseph, MD  LORazepam (ATIVAN) 1 MG tablet Take 1 tablet (1 mg total) by mouth every 8 (eight) hours as needed for anxiety. Patient not taking: Reported on 09/15/2016 07/12/16   Shon Batonourtney F Horton, MD  ondansetron (ZOFRAN ODT) 8 MG disintegrating tablet Take 1 tablet (8 mg total) by mouth every 8 (eight) hours as needed for nausea. Patient not taking: Reported on 09/15/2016  09/15/16   Derwood KaplanAnkit Nanavati, MD  polyethylene glycol (MIRALAX / GLYCOLAX) packet Take 17 g by mouth daily as needed. Patient not taking: Reported on 09/15/2016 07/11/16   Zannie CovePreetha Joseph, MD    Family History Family History  Problem Relation Age of Onset  . Stroke Mother   . Cancer Father     stage IV lung    Social History Social History  Substance Use Topics  . Smoking status: Current Every Day Smoker    Packs/day: 0.50    Types: Cigarettes  . Smokeless tobacco: Never Used  . Alcohol use 0.0 oz/week     Comment: daily since last Wednesday     Allergies   Codeine and Demerol [meperidine]   Review of Systems Review of Systems  Constitutional:       Per HPI, otherwise negative  HENT:       Per HPI, otherwise negative  Respiratory:       Per HPI, otherwise negative  Cardiovascular:       Per HPI, otherwise negative  Gastrointestinal: Positive for abdominal pain, nausea and vomiting.  Endocrine:       Negative aside from HPI  Genitourinary:       Neg aside from HPI   Musculoskeletal:       Per HPI, otherwise negative  Skin: Negative.   Neurological: Negative for syncope.     Physical Exam Updated Vital Signs BP (!) 171/119   Pulse 94   Temp 97.8 F (36.6 C) (Oral)   Resp 16   SpO2 95%   Physical Exam  Constitutional: She is oriented to person, place, and time. She appears well-developed and well-nourished. No distress.  HENT:  Head: Normocephalic and atraumatic.  Right Ear: External ear normal.  Left Ear: External ear normal.  Eyes: Conjunctivae are normal. Right eye exhibits no discharge. Left eye exhibits no discharge. No scleral icterus.  Sclera clear.    Neck: Neck supple. No tracheal deviation present.  Cardiovascular: Normal rate and regular rhythm.   Pulmonary/Chest: Effort normal. No stridor. No respiratory distress. She has no wheezes. She has no rales.  Abdominal: Soft. She exhibits no distension. There is tenderness. There is guarding.    Generalized TTP in the upper abdominal area, worse in the epigastric area. Voluntary guarding.   Musculoskeletal: She exhibits no edema.  Neurological: She is alert and oriented to person, place, and time. Cranial nerve deficit: no gross deficits.  Skin: Skin is warm and dry. No rash noted.  Psychiatric: She has a normal mood and affect.  Nursing note and vitals reviewed.    ED Treatments / Results  Labs (all labs ordered are listed, but only abnormal results are displayed) Labs Reviewed  LIPASE, BLOOD - Abnormal; Notable for the following:       Result Value  Lipase 371 (*)    All other components within normal limits  COMPREHENSIVE METABOLIC PANEL - Abnormal; Notable for the following:    Sodium 134 (*)    Potassium 3.3 (*)    Chloride 98 (*)    Glucose, Bld 173 (*)    Total Protein 8.5 (*)    Albumin 5.1 (*)    All other components within normal limits  CBC WITH DIFFERENTIAL/PLATELET - Abnormal; Notable for the following:    WBC 16.9 (*)    Hemoglobin 15.6 (*)    MCH 34.9 (*)    Neutro Abs 15.3 (*)    All other components within normal limits  ETHANOL     Radiology Ct Abdomen Pelvis W Contrast  Result Date: 09/15/2016 CLINICAL DATA:  Pancreatitis, abdominal pain. EXAM: CT ABDOMEN AND PELVIS WITH CONTRAST TECHNIQUE: Multidetector CT imaging of the abdomen and pelvis was performed using the standard protocol following bolus administration of intravenous contrast. CONTRAST:  ISOVUE-300 IOPAMIDOL (ISOVUE-300) INJECTION 61%, 15mL ISOVUE-300 IOPAMIDOL (ISOVUE-300) INJECTION 61% COMPARISON:  CT scan of July 12, 2016. FINDINGS: Lower chest: Visualized lung bases are unremarkable. Hepatobiliary: No calcified gallstones are noted. Hepatomegaly is noted. Diffuse fatty infiltration of hepatic parenchyma is noted as well. Pancreas: Mild inflammatory changes are noted around the pancreatic head and body suggesting pancreatitis. 6 x 2 mm low density is seen in the uncinate process  of pancreatic head which was not present on prior exam. It is uncertain if this represents a focus of pancreatic necrosis, ductal dilatation or possibly early pseudocyst formation. Spleen: Normal. Adrenals/Urinary Tract: Adrenal glands and kidneys appear normal. No hydronephrosis or renal obstruction is noted. No renal or ureteral calculi are noted. Urinary bladder appears normal. Stomach/Bowel: The appendix appears normal. There is no evidence of bowel obstruction. Vascular/Lymphatic: No significant adenopathy is noted. Reproductive: Status post hysterectomy.  Ovaries are unremarkable. Other: No abnormal fluid collection is noted. Musculoskeletal: Mild multilevel degenerative disc disease is noted in the lumbar spine. IMPRESSION: Hepatomegaly is again noted with diffuse fatty infiltration of the hepatic parenchyma. Mild inflammatory changes are again noted around the pancreatic head and body suggesting acute pancreatitis. There is interval development of 6 x 2 mm low density in the uncinate process of the pancreatic head which may represent a focus of pancreatic necrosis, ductal dilatation or possibly early pseudocyst formation. Electronically Signed   By: Lupita Raider, M.D.   On: 09/15/2016 10:18    Procedures Procedures (including critical care time)  Medications Ordered in ED Medications  sodium chloride 0.9 % bolus 1,000 mL (not administered)  HYDROmorphone (DILAUDID) injection 1 mg (not administered)  metoCLOPramide (REGLAN) injection 10 mg (not administered)     Initial Impression / Assessment and Plan / ED Course  I have reviewed the triage vital signs and the nursing notes.  Pertinent labs & imaging results that were available during my care of the patient were reviewed by me and considered in my medical decision making (see chart for details).  Clinical Course    Chart review notable for history of alcoholic pancreatitis, visit yesterday, and past summer, CT results from July,  2017, as below.   IMPRESSION: 1. There is mild parenchymal prominence in the body and tail of the pancreas with mild peripancreatic stranding and small amount of peripancreatic fluid. Findings are consistent with mild acute pancreatitis. 2. There is small pancreatic pseudocyst in pancreatic tail measures 1.2 cm. 3. There are distended jejunal loops with air-fluid levels highly suspicious for ileus, enteritis  or less likely early small bowel obstruction. The terminal small bowel is small in caliber. Normal appendix. No pericecal inflammation. No distal colonic obstruction. 4. No free abdominal air. 5. No hydronephrosis or hydroureter. 6. Mild degenerative changes lumbar spine. 7. Hepatomegaly. Fatty infiltration of the liver. No calcified gallstones are noted within gallbladder.     Electronically Signed   By: Natasha Mead M.D.   On: 07/12/2016 08:20   Final Clinical Impressions(s) / ED Diagnoses  Patient continues to complain of pain, nausea. With CT evidence for pancreatitis, and increasing lipase compared to yesterday, the patient requires admission for further evaluation, management.   Gerhard Munch, MD 09/15/16 1052

## 2016-09-15 NOTE — ED Notes (Signed)
Bed: WA05 Expected date:  Expected time:  Means of arrival:  Comments: EMS-abdominal pain 

## 2016-09-15 NOTE — Discharge Instructions (Signed)
°  Please return to the ER if your symptoms worsen; you have increased pain, fevers, chills, inability to keep any medications down. Refrain from any amount of alcohol. Clear liquid diet for the 1st 2 days.

## 2016-09-15 NOTE — H&P (Signed)
History and Physical    Brittney Cooper ZOX:[id number] DOB: [birth year] DOA: 09/15/2016  PCP: [name], MD Patient coming from: Home  Chief Complaint: Abdominal pain  HPI: Brittney Cooper is a 55 y.o. female with medical history significant of pancreatitis, alcohol abuse, ADHD, depression, anxiety, hypertension. Patient states that we days ago, she started having symptoms of severe, achy, epigastric pain without radiation. He developed nausea with vomiting with no hematemesis. She went to [facility] for evaluation and was found to have an elevated lipase. She was given analgesics to take at home. Unfortunately, her pain continued to worsen and she returned for reevaluation.  ED Course: Vitals stable with some hypertension. CT abdomen pelvis significant for acute pancreatitis with question of pseudocyst versus necrosis versus ductal dilatation. Labs significant for hypokalemia (3.3), leukocytosis (WBC 16.9) and elevated lipase (371).  Review of Systems: As per HPI Review of Systems  Constitutional: Negative for chills and fever.  Respiratory: Negative for cough and hemoptysis.   Cardiovascular: Negative for chest pain.  Gastrointestinal: Positive for abdominal pain, nausea and vomiting. Negative for constipation and diarrhea.  Genitourinary: Negative for dysuria.  All other systems reviewed and are negative.   Past Medical History:  Diagnosis Date  . ADHD (attention deficit hyperactivity disorder)   . 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 Anxiety   . Arthritis   . Bipolar disorder (HCC)   . Chronic pain    back  . Depression   . Fatty liver   . Fracture of lateral malleolus of right fibula   . Hypertension   . OCD (obsessive compulsive disorder)   . Panic attack   . 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 Syncope    neurocardiogenic    Past Surgical History:  Procedure Laterality Date    . ABDOMINAL HYSTERECTOMY    . ANTERIOR AND POSTERIOR REPAIR    . BUNIONECTOMY Bilateral   . finfger surgery    . FINGER SURGERY    . LTCS    . MYRINGOTOMY    . ORIF ANKLE FRACTURE Right 07/25/2015   Procedure: OPEN REDUCTION INTERNAL FIXATION (ORIF) RIGHT ANKLE ;  Surgeon: Sheral Apley, MD;  Location: [facility];  Service: Orthopedics;  Laterality: Right;  . RHINOPLASTY    . TONSILLECTOMY       reports that she has been smoking Cigarettes.  She has a 15.00 pack-year smoking history. She has never used smokeless tobacco. She reports that she drinks alcohol. She reports that she does not use drugs.  Allergies  Allergen Reactions  . Codeine Nausea And Vomiting  . Demerol [Meperidine] Other (See Comments)    Hallucinations     Family History  Problem Relation Age of Onset  . Stroke Mother   . Kidney disease Mother   . Cancer Father     stage IV lung  . Cancer Maternal Grandfather     Stomach cancer  . Cancer Paternal Grandfather     Colon cancer    Prior to Admission medications   Medication Sig Start Date End Date Taking? Authorizing Provider  ALPRAZolam Prudy Feeler) 0.5 MG tablet Take 0.25 mg by mouth daily. Take every day per patient    Yes Historical Provider, MD  amphetamine-dextroamphetamine (ADDERALL) 20 MG tablet Take 20 mg by mouth 3 (three) times daily.   Yes Historical Provider, MD  aspirin EC 81 MG tablet Take 81 mg  by mouth daily.   Yes Historical Provider, MD  atorvastatin (LIPITOR) 40 MG tablet Take 40 mg by mouth daily.   Yes Historical Provider, MD  losartan (COZAAR) 100 MG tablet Take 100 mg by mouth daily.  06/12/14  Yes Historical Provider, MD  oxyCODONE-acetaminophen (PERCOCET/ROXICET) 5-325 MG tablet Take 1 tablet by mouth every 8 (eight) hours as needed for severe pain. 09/15/16  Yes Derwood Kaplan, MD  promethazine (PHENERGAN) 25 MG suppository Place 1 suppository (25 mg total) rectally every 6 (six) hours as needed for nausea or vomiting.  07/12/16  Yes Shon Baton, MD  sertraline (ZOLOFT) 100 MG tablet Take 150 mg by mouth daily.    Yes Historical Provider, MD  traZODone (DESYREL) 50 MG tablet Take 100-150 mg by mouth at bedtime as needed for sleep. For sleep. 06/30/16  Yes Historical Provider, MD  Hydrocodone-Acetaminophen 5-300 MG TABS Take 1 tablet by mouth every 6 (six) hours as needed (for moderate to severe pain). Patient not taking: Reported on 07/12/2016 07/11/16   Zannie Cove, MD  LORazepam (ATIVAN) 1 MG tablet Take 1 tablet (1 mg total) by mouth every 8 (eight) hours as needed for anxiety. Patient not taking: Reported on 09/15/2016 07/12/16   Shon Baton, MD  ondansetron (ZOFRAN ODT) 8 MG disintegrating tablet Take 1 tablet (8 mg total) by mouth every 8 (eight) hours as needed for nausea. Patient not taking: Reported on 09/15/2016 09/15/16   Derwood Kaplan, MD  polyethylene glycol (MIRALAX / GLYCOLAX) packet Take 17 g by mouth daily as needed. Patient not taking: Reported on 09/15/2016 07/11/16   Zannie Cove, MD    Physical Exam: Vitals:   09/15/16 0743 09/15/16 0801 09/15/16 0802 09/15/16 1123  BP:  (!) 178/115 (!) 171/119 (!) 158/103  Pulse:  94  96  Resp:  16  16  Temp:  97.8 F (36.6 C)    TempSrc:  Oral    SpO2: 98% 95%  95%      Constitutional: NAD, calm, comfortable Vitals:   09/15/16 0743 09/15/16 0801 09/15/16 0802 09/15/16 1123  BP:  (!) 178/115 (!) 171/119 (!) 158/103  Pulse:  94  96  Resp:  16  16  Temp:  97.8 F (36.6 C)    TempSrc:  Oral    SpO2: 98% 95%  95%   Eyes: PERRL, lids and conjunctivae normal ENMT: Mucous membranes are Dry. Posterior pharynx clear of any exudate or lesions. Normal dentition.  Neck: normal, supple, no masses, no thyromegaly Respiratory: clear to auscultation bilaterally, no wheezing, no crackles. Normal respiratory effort. No accessory muscle use.  Cardiovascular: Regular rate and rhythm, no murmurs / rubs / gallops. No extremity edema. 2+ pedal pulses.  No carotid bruits.  Abdomen: Mild epigastric tenderness, no masses palpated. Bowel sounds positive.  Musculoskeletal: no clubbing / cyanosis. No joint deformity upper and lower extremities. Good ROM, no contractures. Normal muscle tone.  Skin: no rashes, lesions, ulcers. No induration Neurologic: CN 2-12 grossly intact. Sensation intact, DTR normal. Strength 5/5 in all 4.  Psychiatric: Normal judgment and insight. Alert and oriented x 3. Normal mood.   Labs on Admission: I have personally reviewed following labs and imaging studies  CBC:  Recent Labs Lab 09/14/16 1830 09/15/16 0841  WBC 12.5* 16.9*  NEUTROABS  --  15.3*  HGB 15.5* 15.6*  HCT 43.8 44.0  MCV 98.6 98.4  PLT 314 300   Basic Metabolic Panel:  Recent Labs Lab 09/14/16 1830 09/15/16 0841  NA 137 134*  K 3.7 3.3*  CL 99* 98*  CO2 24 23  GLUCOSE 154* 173*  BUN 23* 13  CREATININE 0.75 0.62  CALCIUM 9.7 8.9   GFR: Estimated Creatinine Clearance: 88.7 mL/min (by C-G formula based on SCr of 0.62 mg/dL). Liver Function Tests:  Recent Labs Lab 09/14/16 1830 09/15/16 0841  AST 56* 37  ALT 61* 52  ALKPHOS 103 104  BILITOT 1.2 1.0  PROT 8.2* 8.5*  ALBUMIN 5.2* 5.1*    Recent Labs Lab 09/14/16 1830 09/15/16 0841  LIPASE 334* 371*    Urine analysis:    Component Value Date/Time   COLORURINE YELLOW 07/12/2016 0450   APPEARANCEUR CLEAR 07/12/2016 0450   LABSPEC 1.016 07/12/2016 0450   PHURINE 6.0 07/12/2016 0450   GLUCOSEU NEGATIVE 07/12/2016 0450   HGBUR TRACE (A) 07/12/2016 0450   BILIRUBINUR NEGATIVE 07/12/2016 0450   KETONESUR 40 (A) 07/12/2016 0450   PROTEINUR NEGATIVE 07/12/2016 0450   UROBILINOGEN 0.2 10/27/2015 1805   NITRITE NEGATIVE 07/12/2016 0450   LEUKOCYTESUR NEGATIVE 07/12/2016 0450    Radiological Exams on Admission: Ct Abdomen Pelvis W Contrast  Result Date: 09/15/2016 CLINICAL DATA:  Pancreatitis, abdominal pain. EXAM: CT ABDOMEN AND PELVIS WITH CONTRAST TECHNIQUE:  Multidetector CT imaging of the abdomen and pelvis was performed using the standard protocol following bolus administration of intravenous contrast. CONTRAST:  100mL ISOVUE-300 IOPAMIDOL (ISOVUE-300) INJECTION 61%, 15mL ISOVUE-300 IOPAMIDOL (ISOVUE-300) INJECTION 61% COMPARISON:  CT scan of July 12, 2016. FINDINGS: Lower chest: Visualized lung bases are unremarkable. Hepatobiliary: No calcified gallstones are noted. Hepatomegaly is noted. Diffuse fatty infiltration of hepatic parenchyma is noted as well. Pancreas: Mild inflammatory changes are noted around the pancreatic head and body suggesting pancreatitis. 6 x 2 mm low density is seen in the uncinate process of pancreatic head which was not present on prior exam. It is uncertain if this represents a focus of pancreatic necrosis, ductal dilatation or possibly early pseudocyst formation. Spleen: Normal. Adrenals/Urinary Tract: Adrenal glands and kidneys appear normal. No hydronephrosis or renal obstruction is noted. No renal or ureteral calculi are noted. Urinary bladder appears normal. Stomach/Bowel: The appendix appears normal. There is no evidence of bowel obstruction. Vascular/Lymphatic: No significant adenopathy is noted. Reproductive: Status post hysterectomy.  Ovaries are unremarkable. Other: No abnormal fluid collection is noted. Musculoskeletal: Mild multilevel degenerative disc disease is noted in the lumbar spine. IMPRESSION: Hepatomegaly is again noted with diffuse fatty infiltration of the hepatic parenchyma. Mild inflammatory changes are again noted around the pancreatic head and body suggesting acute pancreatitis. There is interval development of 6 x 2 mm low density in the uncinate process of the pancreatic head which may represent a focus of pancreatic necrosis, ductal dilatation or possibly early pseudocyst formation. Electronically Signed   By: Lupita RaiderJames  Green Jr, M.D.   On: 09/15/2016 10:18    EKG: Not obtained prior to  admission  Assessment/Plan Principal Problem:   Acute pancreatitis Active Problems:   Bipolar disorder (HCC)   Essential hypertension   Alcohol abuse   ADHD (attention deficit hyperactivity disorder)   Tobacco use   Anxiety   Hypokalemia   Acute pancreatitis Abdominal pain Patient meets criteria for acute pancreatitis. Likely secondary to alcohol use. Currently stable. Pain is manageable with analgesics. CT significant for question of pancreatic necrosis versus ductal dilatation versus early pseudocyst. -sips with meds -Analgesics when necessary -Advance diet as tolerated -lipid panel -IVF @125ml /hr with KCl (for 24 hours), reassess fluid status at that time  Leukocytosis Likely secondary to acute  pancreatitis. Patient is afebrile and stable. Doubt infected necrosis at this time. -CBC in the morning  Hypokalemia -replace potassium with IVF ( in 24 hours) -repeat BMP in AM  Tobacco use -nicotine patch  Alcohol abuse Patient's last dated use of alcohol was 4 days ago -CIWA protocol  Essential hypertension -Continue losartan  Bipolar disorder Depression Anxiety -Continue Zoloft and Xanax -Continue trazodone when necessary at night  ADHD -Hold Adderall while inpatient   DVT prophylaxis: Heparin subcutaneous Code Status: Full code Family Communication: None at bedside Disposition Plan: Likely discharge home in 2-3 days Consults called: None Admission status: Inpatient, MedSurg   Jacquelin Hawking MD [facility] Pager [phone]  If 7PM-7AM, please contact night-coverage [email or web address] Password [facility]  09/15/2016, 11:36 AM

## 2016-09-15 NOTE — ED Triage Notes (Signed)
Per [facility] pt reports epigastric pain x 4 days. Was seen yesterday for same symptoms. Nausea yet no emesis. No diarrhea.

## 2016-09-16 ENCOUNTER — Ambulatory Visit: Payer: 59 | Admitting: Internal Medicine

## 2016-09-16 DIAGNOSIS — F3170 Bipolar disorder, currently in remission, most recent episode unspecified: Secondary | ICD-10-CM

## 2016-09-16 LAB — COMPREHENSIVE METABOLIC PANEL
ALT: 31 U/L (ref 14–54)
AST: 23 U/L (ref 15–41)
Albumin: 3.9 g/dL (ref 3.5–5.0)
Alkaline Phosphatase: 85 U/L (ref 38–126)
Anion gap: 8 (ref 5–15)
BILIRUBIN TOTAL: 0.8 mg/dL (ref 0.3–1.2)
BUN: 5 mg/dL — AB (ref 6–20)
CO2: 25 mmol/L (ref 22–32)
CREATININE: 0.55 mg/dL (ref 0.44–1.00)
Calcium: 8 mg/dL — ABNORMAL LOW (ref 8.9–10.3)
Chloride: 101 mmol/L (ref 101–111)
GFR calc Af Amer: >60 mL/min (ref >60)
Glucose, Bld: 167 mg/dL — ABNORMAL HIGH (ref 65–99)
Potassium: 3.1 mmol/L — ABNORMAL LOW (ref 3.5–5.1)
Sodium: 134 mmol/L — ABNORMAL LOW (ref 135–145)
TOTAL PROTEIN: 7.2 g/dL (ref 6.5–8.1)

## 2016-09-16 LAB — LIPID PANEL
CHOL/HDL RATIO: 2.1 ratio
Cholesterol: 125 mg/dL (ref 0–200)
HDL: 60 mg/dL (ref >40)
LDL CALC: 31 mg/dL (ref 0–99)
TRIGLYCERIDES: 171 mg/dL — AB (ref <150)
VLDL: 34 mg/dL (ref 0–40)

## 2016-09-16 LAB — CBC
HEMATOCRIT: 40.3 % (ref 36.0–46.0)
Hemoglobin: 13.8 g/dL (ref 12.0–15.0)
MCH: 34.2 pg — ABNORMAL HIGH (ref 26.0–34.0)
MCHC: 34.2 g/dL (ref 30.0–36.0)
MCV: 99.8 fL (ref 78.0–100.0)
PLATELETS: 232 10*3/uL (ref 150–400)
RBC: 4.04 MIL/uL (ref 3.87–5.11)
RDW: 14.7 % (ref 11.5–15.5)
WBC: 17.9 10*3/uL — AB (ref 4.0–10.5)

## 2016-09-16 MED ORDER — MAGNESIUM SULFATE 2 GM/50ML IV SOLN
2.0000 g | Freq: Once | INTRAVENOUS | Status: AC
  Administered 2016-09-16: 2 g via INTRAVENOUS
  Filled 2016-09-16: qty 50

## 2016-09-16 MED ORDER — POTASSIUM CHLORIDE 10 MEQ/100ML IV SOLN
10.0000 meq | INTRAVENOUS | Status: AC
  Administered 2016-09-16 (×4): 10 meq via INTRAVENOUS
  Filled 2016-09-16 (×4): qty 100

## 2016-09-16 NOTE — Progress Notes (Signed)
PROGRESS NOTE    Brittney Cooper  VWU:[id number] DOB: November 19, 1961 DOA: 09/15/2016 PCP: [name], MD   [facility]  To see Dr. Marina Goodell  (outpatient)   Brief Narrative:   Brittney Cooper is a 55 y.o. female with medical history significant of pancreatitis, alcohol abuse, ADHD, depression, anxiety, hypertension. Patient states that we days ago, she started having symptoms of severe, achy, epigastric pain without radiation. He developed nausea with vomiting with no hematemesis. She went to [facility] for evaluation and was found to have an elevated lipase. She was given analgesics to take at home. Unfortunately, her pain continued to worsen and she returned for reevaluation.  Last drink was Saturday.   Assessment & Plan:   Principal Problem:   Acute pancreatitis Active Problems:   Bipolar disorder (HCC)   Essential hypertension   Alcohol abuse   ADHD (attention deficit hyperactivity disorder)   Tobacco use   Anxiety   Hypokalemia   Acute pancreatitis/Abdominal pain Patient meets criteria for acute pancreatitis. Likely secondary to alcohol use. Currently stable. Pain is manageable with analgesics. CT significant for question of pancreatic necrosis versus ductal dilatation versus early pseudocyst. -sips with meds -Analgesics when necessary -lipid panel ok -IVF @125ml /hr with KCl  -has outpatient appointment with Dr. Marina Goodell  Leukocytosis Likely secondary to acute pancreatitis. Patient is afebrile and stable. Doubt infected necrosis at this time. -trend CBC  Hypokalemia -replace  Tobacco use -nicotine patch  Alcohol abuse Patient's last dated use of alcohol was 4 days ago -CIWA protocol  Essential hypertension -Continue losartan  Bipolar disorder Depression Anxiety -Continue Zoloft and Xanax -Continue trazodone when necessary at night  ADHD -Hold Adderall while inpatient    DVT prophylaxis:  SCD's  Code Status: Full  Code   Family Communication:   Disposition Plan:     Consultants:        Subjective: Feels bloated, last time in hospital, had ileus [name])  Objective: Vitals:   09/15/16 1744 09/15/16 2156 09/16/16 0551 09/16/16 0930  BP: (!) 187/115 (!) 163/103 (!) 138/102 (!) 142/104  Pulse: 89 (!) 102 (!) 113 (!) 104  Resp:  18 18   Temp:  98.6 F (37 C) 98.6 F (37 C)   TempSrc:  Oral Oral   SpO2: 96% 96% 99%   Weight: 87.5 kg (192 lb 14.4 oz)     Height: 5\' 4"  (1.626 m)       Intake/Output Summary (Last 24 hours) at 09/16/16 1208 Last data filed at 09/16/16 0600  Gross per 24 hour  Intake          1933.33 ml  Output                0 ml  Net          1933.33 ml   Filed Weights   09/15/16 1744  Weight: 87.5 kg (192 lb 14.4 oz)    Examination:  General exam: Appears calm and comfortable  Respiratory system: Clear to auscultation. Respiratory effort normal. Cardiovascular system: S1 & S2 heard, RRR. No JVD, murmurs, rubs, gallops or clicks. No pedal edema. Gastrointestinal system: Abdomen is nondistended, soft and nontender. No organomegaly or masses felt. Normal bowel sounds heard. Central nervous system: Alert and oriented. No focal neurological deficits. Extremities: Symmetric 5 x 5 power. Skin: No rashes, lesions or ulcers Psychiatry: Judgement and insight appear normal. Mood & affect appropriate.     Data Reviewed: I have personally reviewed following labs and imaging studies  CBC:  Recent Labs Lab 09/14/16 1830 09/15/16 0841 09/16/16 0622  WBC 12.5* 16.9* 17.9*  NEUTROABS  --  15.3*  --   HGB 15.5* 15.6* 13.8  HCT 43.8 44.0 40.3  MCV 98.6 98.4 99.8  PLT 314 300 232   Basic Metabolic Panel:  Recent Labs Lab 09/14/16 1830 09/15/16 0841 09/16/16 0622  NA 137 134* 134*  K 3.7 3.3* 3.1*  CL 99* 98* 101  CO2 24 23 25   GLUCOSE 154* 173* 167*  BUN 23* 13 5*  CREATININE 0.75 0.62 0.55  CALCIUM 9.7 8.9 8.0*   GFR: Estimated Creatinine  Clearance: 87 mL/min (by C-G formula based on SCr of 0.55 mg/dL). Liver Function Tests:  Recent Labs Lab 09/14/16 1830 09/15/16 0841 09/16/16 0622  AST 56* 37 23  ALT 61* 52 31  ALKPHOS 103 104 85  BILITOT 1.2 1.0 0.8  PROT 8.2* 8.5* 7.2  ALBUMIN 5.2* 5.1* 3.9    Recent Labs Lab 09/14/16 1830 09/15/16 0841  LIPASE 334* 371*   No results for input(s): AMMONIA in the last 168 hours. Coagulation Profile: No results for input(s): INR, PROTIME in the last 168 hours. Cardiac Enzymes: No results for input(s): CKTOTAL, CKMB, CKMBINDEX, TROPONINI in the last 168 hours. BNP (last 3 results) No results for input(s): PROBNP in the last 8760 hours. HbA1C: No results for input(s): HGBA1C in the last 72 hours. CBG: No results for input(s): GLUCAP in the last 168 hours. Lipid Profile:  Recent Labs  09/16/16 0622  CHOL 125  HDL 60  LDLCALC 31  TRIG 171*  CHOLHDL 2.1   Thyroid Function Tests: No results for input(s): TSH, T4TOTAL, FREET4, T3FREE, THYROIDAB in the last 72 hours. Anemia Panel: No results for input(s): VITAMINB12, FOLATE, FERRITIN, TIBC, IRON, RETICCTPCT in the last 72 hours. Urine analysis:    Component Value Date/Time   COLORURINE YELLOW 07/12/2016 0450   APPEARANCEUR CLEAR 07/12/2016 0450   LABSPEC 1.016 07/12/2016 0450   PHURINE 6.0 07/12/2016 0450   GLUCOSEU NEGATIVE 07/12/2016 0450   HGBUR TRACE (A) 07/12/2016 0450   BILIRUBINUR NEGATIVE 07/12/2016 0450   KETONESUR 40 (A) 07/12/2016 0450   PROTEINUR NEGATIVE 07/12/2016 0450   UROBILINOGEN 0.2 10/27/2015 1805   NITRITE NEGATIVE 07/12/2016 0450   LEUKOCYTESUR NEGATIVE 07/12/2016 0450    )No results found for this or any previous visit (from the past 240 hour(s)).    Anti-infectives    None       Radiology Studies: Ct Abdomen Pelvis W Contrast  Result Date: 09/15/2016 CLINICAL DATA:  Pancreatitis, abdominal pain. EXAM: CT ABDOMEN AND PELVIS WITH CONTRAST TECHNIQUE: Multidetector CT imaging  of the abdomen and pelvis was performed using the standard protocol following bolus administration of intravenous contrast. CONTRAST:  ISOVUE-300 IOPAMIDOL (ISOVUE-300) INJECTION 61%, 15mL ISOVUE-300 IOPAMIDOL (ISOVUE-300) INJECTION 61% COMPARISON:  CT scan of July 12, 2016. FINDINGS: Lower chest: Visualized lung bases are unremarkable. Hepatobiliary: No calcified gallstones are noted. Hepatomegaly is noted. Diffuse fatty infiltration of hepatic parenchyma is noted as well. Pancreas: Mild inflammatory changes are noted around the pancreatic head and body suggesting pancreatitis. 6 x 2 mm low density is seen in the uncinate process of pancreatic head which was not present on prior exam. It is uncertain if this represents a focus of pancreatic necrosis, ductal dilatation or possibly early pseudocyst formation. Spleen: Normal. Adrenals/Urinary Tract: Adrenal glands and kidneys appear normal. No hydronephrosis or renal obstruction is noted. No renal or ureteral calculi are noted. Urinary bladder appears normal.  Stomach/Bowel: The appendix appears normal. There is no evidence of bowel obstruction. Vascular/Lymphatic: No significant adenopathy is noted. Reproductive: Status post hysterectomy.  Ovaries are unremarkable. Other: No abnormal fluid collection is noted. Musculoskeletal: Mild multilevel degenerative disc disease is noted in the lumbar spine. IMPRESSION: Hepatomegaly is again noted with diffuse fatty infiltration of the hepatic parenchyma. Mild inflammatory changes are again noted around the pancreatic head and body suggesting acute pancreatitis. There is interval development of 6 x 2 mm low density in the uncinate process of the pancreatic head which may represent a focus of pancreatic necrosis, ductal dilatation or possibly early pseudocyst formation. Electronically Signed   By: Lupita RaiderJames  Green Jr, M.D.   On: 09/15/2016 10:18        Scheduled Meds: . ALPRAZolam  0.25 mg Oral Daily  . aspirin EC  81  mg Oral Daily  . atorvastatin  40 mg Oral Daily  . folic acid  1 mg Oral Daily  . heparin  5,000 Units Subcutaneous Q8H  . losartan  100 mg Oral Daily  . multivitamin with minerals  1 tablet Oral Daily  . nicotine  14 mg Transdermal Q24H  . potassium chloride  10 mEq Intravenous Q1 Hr x 4  . sertraline  150 mg Oral Daily  . thiamine  100 mg Oral Daily   Or  . thiamine  100 mg Intravenous Daily   Continuous Infusions: . 0.9 % NaCl with KCl 20 mEq / L 125 mL/hr at 09/15/16 2213     LOS: 1 day    Time spent: 35 min    Stepehn Eckard Juanetta GoslingU Arrionna Serena, DO [facility] Pager [phone]  If 7PM-7AM, please contact night-coverage [email or web address] Password [email or web address] 09/16/2016, 12:08 PM

## 2016-09-17 DIAGNOSIS — K852 Alcohol induced acute pancreatitis without necrosis or infection: Secondary | ICD-10-CM

## 2016-09-17 LAB — BASIC METABOLIC PANEL
ANION GAP: 5 (ref 5–15)
BUN: 10 mg/dL (ref 6–20)
CHLORIDE: 104 mmol/L (ref 101–111)
CO2: 28 mmol/L (ref 22–32)
CREATININE: 0.56 mg/dL (ref 0.44–1.00)
Calcium: 8.1 mg/dL — ABNORMAL LOW (ref 8.9–10.3)
GFR calc non Af Amer: >60 mL/min (ref >60)
Glucose, Bld: 109 mg/dL — ABNORMAL HIGH (ref 65–99)
POTASSIUM: 3.3 mmol/L — AB (ref 3.5–5.1)
SODIUM: 137 mmol/L (ref 135–145)

## 2016-09-17 LAB — CBC
HEMATOCRIT: 35.3 % — AB (ref 36.0–46.0)
HEMOGLOBIN: 11.9 g/dL — AB (ref 12.0–15.0)
MCH: 33.3 pg (ref 26.0–34.0)
MCHC: 33.7 g/dL (ref 30.0–36.0)
MCV: 98.9 fL (ref 78.0–100.0)
Platelets: 209 10*3/uL (ref 150–400)
RBC: 3.57 MIL/uL — AB (ref 3.87–5.11)
RDW: 14.8 % (ref 11.5–15.5)
WBC: 10.2 10*3/uL (ref 4.0–10.5)

## 2016-09-17 MED ORDER — POTASSIUM CHLORIDE 10 MEQ/100ML IV SOLN
10.0000 meq | INTRAVENOUS | Status: AC
  Administered 2016-09-17 (×6): 10 meq via INTRAVENOUS
  Filled 2016-09-17 (×7): qty 100

## 2016-09-17 MED ORDER — POTASSIUM CHLORIDE IN NACL 20-0.9 MEQ/L-% IV SOLN
INTRAVENOUS | Status: DC
  Administered 2016-09-17 (×2): via INTRAVENOUS
  Filled 2016-09-17 (×2): qty 1000

## 2016-09-17 NOTE — Progress Notes (Signed)
PROGRESS NOTE  Brittney Cooper [id number] DOB: 12/28/1960 DOA: 09/15/2016 PCP: Doreatha MartinVELAZQUEZ,[name], MD  Hospital Course/Subjective: [age] F with hx EtOH abuse, pancreatitis, ADHD, depression, anxiety admitted with pancreatitis.    Assessment/Plan: CT abdomen pelvis significant for acute pancreatitis with question of pseudocyst versus necrosis versus ductal dilatation. She has not been on any antibiotics, no fevers, leukocytosis felt to be related to inflammatory response to pancreatitis, she has been NPO on IVF and doing well. No signs or symptoms of infection.  Acute pancreatitis Abdominal pain Patient meets criteria for acute pancreatitis. Likely secondary to alcohol use. Currently stable. Pain is manageable with analgesics. CT significant for question of pancreatic necrosis versus ductal dilatation versus early pseudocyst. -NPO other than sips with meds -Analgesics when necessary - recheck lipase in AM -Advance diet as tolerated from tomorrow if Lipase improved -IVF @125ml /hr with KCl   Leukocytosis Likely secondary to acute pancreatitis. Patient is afebrile and stable. Doubt infected necrosis at this time. -CBC daily  Hypokalemia -replace potassium with IVF (60meq in 24 hours) -repeat BMP in AM  Tobacco use -nicotine patch  Alcohol abuse Patient's last dated use of alcohol was 5 days ago -CIWA protocol  Essential hypertension -Continue losartan  Bipolar disorder Depression Anxiety -Continue Zoloft and Xanax -Continue trazodone when necessary at night  ADHD -Hold Adderall while inpatient  DVT Prophylaxis: Heparin SQ  Code Status: FULL   Family Communication: None present   Disposition Plan: Home in 2-3 days.   Consultants:  None   Procedures:  None   Antimicrobials:  None    Objective: Vitals:   09/16/16 0930 09/16/16 1400 09/16/16 2051 09/17/16 0511  BP: (!) 142/104 (!) 123/91 127/90 123/76  Pulse: (!) 104 92  85  Resp:   18 18  Temp:   98.4 F (36.9 C) 98.2 F (36.8 C) 98.3 F (36.8 C)  TempSrc:  Oral Oral Oral  SpO2:  96% 94% 93%  Weight:      Height:        Intake/Output Summary (Last 24 hours) at 09/17/16 1317 Last data filed at 09/17/16 0500  Gross per 24 hour  Intake             2875 ml  Output                0 ml  Net             2875 ml   Filed Weights   09/15/16 1744  Weight: 87.5 kg (192 lb 14.4 oz)     Exam: General:  Alert, oriented, calm, in no acute distress Neck: supple, no masses, trachea mildline  Cardiovascular: RRR, no murmurs or rubs, no peripheral edema  Respiratory: clear to auscultation bilaterally, no wheezes, no crackles  Abdomen: soft, nontender, nondistended, normal bowel tones heard  Skin: dry, no rashes  Musculoskeletal: no joint effusions, normal range of motion  Psychiatric: appropriate affect, normal speech  Neurologic: extraocular muscles intact, clear speech, moving all extremities with intact sensorium    Data Reviewed: CBC:  Recent Labs Lab 09/14/16 1830 09/15/16 0841 09/16/16 0622 09/17/16 0521  WBC 12.5* 16.9* 17.9* 10.2  NEUTROABS  --  15.3*  --   --   HGB 15.5* 15.6* 13.8 11.9*  HCT 43.8 44.0 40.3 35.3*  MCV 98.6 98.4 99.8 98.9  PLT 314 300 232 209   Basic Metabolic Panel:  Recent Labs Lab 09/14/16 1830 09/15/16 0841 09/16/16 0622 09/17/16 0521  NA 137 134* 134* 137  K 3.7  3.3* 3.1* 3.3*  CL 99* 98* 101 104  CO2 24 23 25 28   GLUCOSE 154* 173* 167* 109*  BUN 23* 13 5* 10  CREATININE 0.75 0.62 0.55 0.56  CALCIUM 9.7 8.9 8.0* 8.1*   GFR: Estimated Creatinine Clearance: 87 mL/min (by C-G formula based on SCr of 0.56 mg/dL). Liver Function Tests:  Recent Labs Lab 09/14/16 1830 09/15/16 0841 09/16/16 0622  AST 56* 37 23  ALT 61* 52 31  ALKPHOS 103 104 85  BILITOT 1.2 1.0 0.8  PROT 8.2* 8.5* 7.2  ALBUMIN 5.2* 5.1* 3.9    Recent Labs Lab 09/14/16 1830 09/15/16 0841  LIPASE 334* 371*   No results for input(s): AMMONIA in the  last 168 hours. Coagulation Profile: No results for input(s): INR, PROTIME in the last 168 hours. Cardiac Enzymes: No results for input(s): CKTOTAL, CKMB, CKMBINDEX, TROPONINI in the last 168 hours. BNP (last 3 results) No results for input(s): PROBNP in the last 8760 hours. HbA1C: No results for input(s): HGBA1C in the last 72 hours. CBG: No results for input(s): GLUCAP in the last 168 hours. Lipid Profile:  Recent Labs  09/16/16 0622  CHOL 125  HDL 60  LDLCALC 31  TRIG 171*  CHOLHDL 2.1   Thyroid Function Tests: No results for input(s): TSH, T4TOTAL, FREET4, T3FREE, THYROIDAB in the last 72 hours. Anemia Panel: No results for input(s): VITAMINB12, FOLATE, FERRITIN, TIBC, IRON, RETICCTPCT in the last 72 hours. Urine analysis:    Component Value Date/Time   COLORURINE YELLOW 07/12/2016 0450   APPEARANCEUR CLEAR 07/12/2016 0450   LABSPEC 1.016 07/12/2016 0450   PHURINE 6.0 07/12/2016 0450   GLUCOSEU NEGATIVE 07/12/2016 0450   HGBUR TRACE (A) 07/12/2016 0450   BILIRUBINUR NEGATIVE 07/12/2016 0450   KETONESUR 40 (A) 07/12/2016 0450   PROTEINUR NEGATIVE 07/12/2016 0450   UROBILINOGEN 0.2 10/27/2015 1805   NITRITE NEGATIVE 07/12/2016 0450   LEUKOCYTESUR NEGATIVE 07/12/2016 0450   Sepsis Labs: @LABRCNTIP (procalcitonin:4,lacticidven:4)  )No results found for this or any previous visit (from the past 240 hour(s)).   Studies: No results found.  Scheduled Meds: . ALPRAZolam  0.25 mg Oral Daily  . aspirin EC  81 mg Oral Daily  . atorvastatin  40 mg Oral Daily  . folic acid  1 mg Oral Daily  . heparin  5,000 Units Subcutaneous Q8H  . losartan  100 mg Oral Daily  . multivitamin with minerals  1 tablet Oral Daily  . nicotine  14 mg Transdermal Q24H  . sertraline  150 mg Oral Daily  . thiamine  100 mg Oral Daily   Or  . thiamine  100 mg Intravenous Daily    Continuous Infusions:    LOS: 2 days   Time spent: 23 minutes  Gilverto Dileonardo [name], MD [facility] Pager [phone]  If 7PM-7AM, please contact night-coverage [email or web address] Password [email or web address] 09/17/2016, 1:17 PM

## 2016-09-18 LAB — BASIC METABOLIC PANEL
ANION GAP: 9 (ref 5–15)
BUN: 9 mg/dL (ref 6–20)
CHLORIDE: 106 mmol/L (ref 101–111)
CO2: 23 mmol/L (ref 22–32)
Calcium: 8.1 mg/dL — ABNORMAL LOW (ref 8.9–10.3)
Creatinine, Ser: 0.6 mg/dL (ref 0.44–1.00)
Glucose, Bld: 96 mg/dL (ref 65–99)
POTASSIUM: 3.6 mmol/L (ref 3.5–5.1)
SODIUM: 138 mmol/L (ref 135–145)

## 2016-09-18 LAB — CBC
HEMATOCRIT: 34.9 % — AB (ref 36.0–46.0)
HEMOGLOBIN: 11.7 g/dL — AB (ref 12.0–15.0)
MCH: 33.7 pg (ref 26.0–34.0)
MCHC: 33.5 g/dL (ref 30.0–36.0)
MCV: 100.6 fL — AB (ref 78.0–100.0)
Platelets: 226 10*3/uL (ref 150–400)
RBC: 3.47 MIL/uL — AB (ref 3.87–5.11)
RDW: 15.1 % (ref 11.5–15.5)
WBC: 8.9 10*3/uL (ref 4.0–10.5)

## 2016-09-18 LAB — LIPASE, BLOOD: LIPASE: 23 U/L (ref 11–51)

## 2016-09-18 MED ORDER — FOLIC ACID 1 MG PO TABS: 1.0000 mg | ORAL_TABLET | Freq: Every day | ORAL | 0 refills | Status: DC

## 2016-09-18 MED ORDER — ADULT MULTIVITAMIN W/MINERALS CH: 1.0000 | ORAL_TABLET | Freq: Every day | ORAL | 0 refills | Status: DC

## 2016-09-18 MED ORDER — THIAMINE HCL 100 MG PO TABS: 100.0000 mg | ORAL_TABLET | Freq: Every day | ORAL | 0 refills | Status: DC

## 2016-09-18 MED ORDER — OXYCODONE HCL 5 MG PO TABS: 5.0000 mg | ORAL_TABLET | ORAL | 0 refills | Status: DC | PRN

## 2016-09-18 MED ORDER — NICOTINE 14 MG/24HR TD PT24: 14.0000 mg | MEDICATED_PATCH | TRANSDERMAL | 0 refills | Status: DC

## 2016-09-18 NOTE — Discharge Summary (Signed)
Discharge Summary  Brittney Cooper [id number] DOB: 05/24/1961  PCP: Doreatha MartinVELAZQUEZ,[name], MD  Admit date: 09/15/2016 Discharge date: 09/18/2016   Recommendations for Outpatient Follow-up:  1. PCP 1-2 weeks 2. GI 1 week, has already rescheduled appointment.   Discharge Diagnoses:  Active Hospital Problems   Diagnosis Date Noted  . Acute pancreatitis 09/15/2016  . Alcohol abuse 09/15/2016  . ADHD (attention deficit hyperactivity disorder) 09/15/2016  . Tobacco use 09/15/2016  . Anxiety 09/15/2016  . Hypokalemia 09/15/2016  . Essential hypertension 07/08/2016  . Bipolar disorder (HCC) 07/08/2016    Resolved Hospital Problems   Diagnosis Date Noted Date Resolved  No resolved problems to display.    Discharge Condition: Stable   Diet recommendation: Low Fat   Vitals:   09/17/16 2101 09/18/16 0626  BP: (!) 133/102 (!) 153/93  Pulse: 96 87  Resp: 19 17  Temp: 98.3 F (36.8 C) 98.4 F (36.9 C)    History of present illness:  [age] female with history of EtOH abuse, pancreatitis, ADHD, depression admitted with pancreatitis.   Hospital Course:  Principal Problem:   Acute pancreatitis Active Problems:   Bipolar disorder (HCC)   Essential hypertension   Alcohol abuse   ADHD (attention deficit hyperactivity disorder)   Tobacco use   Anxiety   Hypokalemia  CT abdomen pelvis significant for acute pancreatitis with question of pseudocyst versus necrosis versus ductal dilatation. She has not been on any antibiotics, no fevers, leukocytosis felt to be related to inflammatory response to pancreatitis, she has been NPO on IVF and doing well. No signs or symptoms of infection. She had labs rechecked this AM, lipase normal, feeling well. Diet advanced and will go home with evening after she tolerated low fat diet. All questions answered. Note she has outpt GI consult already.  Procedures:  CT abd 9/20 with acute pancreatitis.   Consultations:  None   Discharge Exam: BP (!)  153/93 (BP Location: Right Arm)   Pulse 87   Temp 98.4 F (36.9 C) (Oral)   Resp 17   Ht 5\' 4"  (1.626 m)   Wt 87.5 kg (192 lb 14.4 oz)   SpO2 97%   BMI 33.11 kg/m  General:  Alert, oriented, calm, in no acute distress  Eyes: pupils round and reactive to light and accomodation, clear sclerea Neck: supple, no masses, trachea mildline  Cardiovascular: RRR, no murmurs or rubs, no peripheral edema  Respiratory: clear to auscultation bilaterally, no wheezes, no crackles  Abdomen: soft, nontender, nondistended, normal bowel tones heard  Skin: dry, no rashes  Musculoskeletal: no joint effusions, normal range of motion  Psychiatric: appropriate affect, normal speech  Neurologic: extraocular muscles intact, clear speech, moving all extremities with intact sensorium    Discharge Instructions You were cared for by a hospitalist during your hospital stay. If you have any questions about your discharge medications or the care you received while you were in the hospital after you are discharged, you can call the unit and asked to speak with the hospitalist on call if the hospitalist that took care of you is not available. Once you are discharged, your primary care physician will handle any further medical issues. Please note that NO REFILLS for any discharge medications will be authorized once you are discharged, as it is imperative that you return to your primary care physician (or establish a relationship with a primary care physician if you do not have one) for your aftercare needs so that they can reassess your need for medications and  monitor your lab values.  Discharge Instructions    Diet - low sodium heart healthy    Complete by:  As directed    Increase activity slowly    Complete by:  As directed        Medication List    STOP taking these medications   oxyCODONE-acetaminophen 5-325 MG tablet Commonly known as:  PERCOCET/ROXICET     TAKE these medications   ALPRAZolam 0.5 MG  tablet Commonly known as:  XANAX Take 0.25 mg by mouth daily. Take every day per patient   amphetamine-dextroamphetamine 20 MG tablet Commonly known as:  ADDERALL Take 20 mg by mouth 3 (three) times daily.   aspirin EC 81 MG tablet Take 81 mg by mouth daily.   atorvastatin 40 MG tablet Commonly known as:  LIPITOR Take 40 mg by mouth daily.   folic acid 1 MG tablet Commonly known as:  FOLVITE Take 1 tablet (1 mg total) by mouth daily. Start taking on:  09/19/2016   losartan 100 MG tablet Commonly known as:  COZAAR Take 100 mg by mouth daily.   multivitamin with minerals Tabs tablet Take 1 tablet by mouth daily. Start taking on:  09/19/2016   nicotine 14 mg/24hr patch Commonly known as:  NICODERM CQ - dosed in mg/24 hours Place 1 patch (14 mg total) onto the skin daily.   ondansetron 8 MG disintegrating tablet Commonly known as:  ZOFRAN ODT Take 1 tablet (8 mg total) by mouth every 8 (eight) hours as needed for nausea.   oxyCODONE 5 MG immediate release tablet Commonly known as:  Oxy IR/ROXICODONE Take 1 tablet (5 mg total) by mouth every 4 (four) hours as needed for moderate pain.   polyethylene glycol packet Commonly known as:  MIRALAX / GLYCOLAX Take 17 g by mouth daily as needed.   promethazine 25 MG suppository Commonly known as:  PHENERGAN Place 1 suppository (25 mg total) rectally every 6 (six) hours as needed for nausea or vomiting.   sertraline 100 MG tablet Commonly known as:  ZOLOFT Take 150 mg by mouth daily.   thiamine 100 MG tablet Take 1 tablet (100 mg total) by mouth daily. Start taking on:  09/19/2016   traZODone 50 MG tablet Commonly known as:  DESYREL Take 100-150 mg by mouth at bedtime as needed for sleep. For sleep.      Allergies  Allergen Reactions  . Codeine Nausea And Vomiting  . Demerol [Meperidine] Other (See Comments)    Hallucinations       The results of significant diagnostics from this hospitalization (including  imaging, microbiology, ancillary and laboratory) are listed below for reference.    Significant Diagnostic Studies: Ct Abdomen Pelvis W Contrast  Result Date: 09/15/2016 CLINICAL DATA:  Pancreatitis, abdominal pain. EXAM: CT ABDOMEN AND PELVIS WITH CONTRAST TECHNIQUE: Multidetector CT imaging of the abdomen and pelvis was performed using the standard protocol following bolus administration of intravenous contrast. CONTRAST:  ISOVUE-300 IOPAMIDOL (ISOVUE-300) INJECTION 61%, 15mL ISOVUE-300 IOPAMIDOL (ISOVUE-300) INJECTION 61% COMPARISON:  CT scan of July 12, 2016. FINDINGS: Lower chest: Visualized lung bases are unremarkable. Hepatobiliary: No calcified gallstones are noted. Hepatomegaly is noted. Diffuse fatty infiltration of hepatic parenchyma is noted as well. Pancreas: Mild inflammatory changes are noted around the pancreatic head and body suggesting pancreatitis. 6 x 2 mm low density is seen in the uncinate process of pancreatic head which was not present on prior exam. It is uncertain if this represents a focus of pancreatic necrosis, ductal  dilatation or possibly early pseudocyst formation. Spleen: Normal. Adrenals/Urinary Tract: Adrenal glands and kidneys appear normal. No hydronephrosis or renal obstruction is noted. No renal or ureteral calculi are noted. Urinary bladder appears normal. Stomach/Bowel: The appendix appears normal. There is no evidence of bowel obstruction. Vascular/Lymphatic: No significant adenopathy is noted. Reproductive: Status post hysterectomy.  Ovaries are unremarkable. Other: No abnormal fluid collection is noted. Musculoskeletal: Mild multilevel degenerative disc disease is noted in the lumbar spine. IMPRESSION: Hepatomegaly is again noted with diffuse fatty infiltration of the hepatic parenchyma. Mild inflammatory changes are again noted around the pancreatic head and body suggesting acute pancreatitis. There is interval development of 6 x 2 mm low density in the  uncinate process of the pancreatic head which may represent a focus of pancreatic necrosis, ductal dilatation or possibly early pseudocyst formation. Electronically Signed   By: Lupita Raider, M.D.   On: 09/15/2016 10:18    Microbiology: No results found for this or any previous visit (from the past 240 hour(s)).   Labs: Basic Metabolic Panel:  Recent Labs Lab 09/14/16 1830 09/15/16 0841 09/16/16 0622 09/17/16 0521 09/18/16 0552  NA 137 134* 134* 137 138  K 3.7 3.3* 3.1* 3.3* 3.6  CL 99* 98* 101 104 106  CO2 24 23 25 28 23   GLUCOSE 154* 173* 167* 109* 96  BUN 23* 13 5* 10 9  CREATININE 0.75 0.62 0.55 0.56 0.60  CALCIUM 9.7 8.9 8.0* 8.1* 8.1*   Liver Function Tests:  Recent Labs Lab 09/14/16 1830 09/15/16 0841 09/16/16 0622  AST 56* 37 23  ALT 61* 52 31  ALKPHOS 103 104 85  BILITOT 1.2 1.0 0.8  PROT 8.2* 8.5* 7.2  ALBUMIN 5.2* 5.1* 3.9    Recent Labs Lab 09/14/16 1830 09/15/16 0841 09/18/16 0552  LIPASE 334* 371* 23   No results for input(s): AMMONIA in the last 168 hours. CBC:  Recent Labs Lab 09/14/16 1830 09/15/16 0841 09/16/16 0622 09/17/16 0521 09/18/16 0552  WBC 12.5* 16.9* 17.9* 10.2 8.9  NEUTROABS  --  15.3*  --   --   --   HGB 15.5* 15.6* 13.8 11.9* 11.7*  HCT 43.8 44.0 40.3 35.3* 34.9*  MCV 98.6 98.4 99.8 98.9 100.6*  PLT 314 300 232 209 226   Cardiac Enzymes: No results for input(s): CKTOTAL, CKMB, CKMBINDEX, TROPONINI in the last 168 hours. BNP: BNP (last 3 results) No results for input(s): BNP in the last 8760 hours.  ProBNP (last 3 results) No results for input(s): PROBNP in the last 8760 hours.  CBG: No results for input(s): GLUCAP in the last 168 hours.  Time spent: 32 minutes were spent in preparing this discharge including medication reconciliation, counseling, and coordination of care.  Signed:  Mir [name]  [facility] 09/18/2016, 12:56 PM

## 2016-09-18 NOTE — Progress Notes (Signed)
Pt left at this time, going home via [employer]. Pt alert, oriented, and without c/o. Discharge instructions/prescriptions given/explained with pt verbalizing understanding. Pt aware to pickup prescriptions at said pharmacy. Followup appointment noted.

## 2016-09-24 ENCOUNTER — Emergency Department (HOSPITAL_COMMUNITY): Payer: 59

## 2016-09-24 ENCOUNTER — Emergency Department (HOSPITAL_COMMUNITY)
Admission: EM | Admit: 2016-09-24 | Discharge: 2016-09-24 | Disposition: A | Discharge status: Home / Self Care | Payer: 59 | Attending: Emergency Medicine | Admitting: Emergency Medicine

## 2016-09-24 ENCOUNTER — Telehealth: Payer: Self-pay | Admitting: *Deleted

## 2016-09-24 ENCOUNTER — Encounter (HOSPITAL_COMMUNITY): Payer: Self-pay

## 2016-09-24 DIAGNOSIS — Y999 Unspecified external cause status: Secondary | ICD-10-CM | POA: Diagnosis not present

## 2016-09-24 DIAGNOSIS — Y929 Unspecified place or not applicable: Secondary | ICD-10-CM | POA: Diagnosis not present

## 2016-09-24 DIAGNOSIS — R55 Syncope and collapse: Secondary | ICD-10-CM | POA: Diagnosis not present

## 2016-09-24 DIAGNOSIS — W19XXXA Unspecified fall, initial encounter: Secondary | ICD-10-CM | POA: Insufficient documentation

## 2016-09-24 DIAGNOSIS — Y939 Activity, unspecified: Secondary | ICD-10-CM | POA: Diagnosis not present

## 2016-09-24 DIAGNOSIS — S0101XA Laceration without foreign body of scalp, initial encounter: Secondary | ICD-10-CM | POA: Insufficient documentation

## 2016-09-24 DIAGNOSIS — S0191XA Laceration without foreign body of unspecified part of head, initial encounter: Secondary | ICD-10-CM

## 2016-09-24 LAB — CBG MONITORING, ED: Glucose-Capillary: 116 mg/dL — ABNORMAL HIGH (ref 65–99)

## 2016-09-24 NOTE — Telephone Encounter (Signed)
NEED  TO  FIND  WHAT  PT  WAS DOING AT  3:00 PM  AND  ALSO NEEDS   APPT  WITH   DR  Graciela HusbandsKLEIN .Zack Seal/CY

## 2016-09-24 NOTE — ED Triage Notes (Signed)
Patient comes via EMS for a fall and laceration to her posterior head.  Matted. Bloody hair noted.  Does not remember the fall.  Has halter monitor for recent syncopal episodes. Patient states she has not been drinking for a few weeks then started again a day ago.  Says she has been drinking vodka today.  A&Ox4 at this time no obvious signs of distress.

## 2016-09-24 NOTE — Telephone Encounter (Signed)
LM MESSAGE FOR PT  TO CALL BACK    RE   PAUSE ON MONITOR .DYSAUTONOMIA SYNCOPE WITH  SIMULTANEOUS  PP SLOWING AND  AV  BLOCK  PER  DR Graciela HusbandsKLEIN .Zack Seal/CY

## 2016-09-24 NOTE — ED Provider Notes (Addendum)
[facility] DEPT Provider Note   CSN: [id number] Arrival date & time: 09/24/16  1655     History   Chief Complaint Chief Complaint  Patient presents with  . Fall  . Head Laceration    HPI Brittney Cooper is a 55 y.o. female.  Level V caveat intoxication.  Patient woke up in bed with blood in the bed.  Unsure what happened.  Was drinking this morning.  Holter monitor on.    The history is provided by the patient.    Past Medical History:  Diagnosis Date  . ADHD (attention deficit hyperactivity disorder)   . 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 Anxiety   . Arthritis   . Bipolar disorder (HCC)   . Chronic pain    back  . Depression   . Fatty liver   . Fracture of lateral malleolus of right fibula   . Hepatomegaly   . Hypertension   . OCD (obsessive compulsive disorder)   . Pancreatic pseudocyst   . Panic attack   . 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 Syncope    neurocardiogenic    Patient Active Problem List   Diagnosis Date Noted  . Complete heart block (HCC)   . Palpitation 10/11/2016  . Palpitations 10/11/2016  . Acute pancreatitis 09/15/2016  . Alcohol abuse 09/15/2016  . ADHD (attention deficit hyperactivity disorder) 09/15/2016  . Tobacco use 09/15/2016  . Anxiety 09/15/2016  . Hypokalemia 09/15/2016  . Pancreatitis 07/09/2016  . Alcoholic pancreatitis 07/08/2016  . Alcohol withdrawal (HCC) 07/08/2016  . Bipolar disorder (HCC) 07/08/2016  . Essential hypertension 07/08/2016  . Left shoulder pain 09/09/2014    Past Surgical History:  Procedure Laterality Date  . ABDOMINAL HYSTERECTOMY    . ANTERIOR AND POSTERIOR REPAIR    . BUNIONECTOMY Bilateral   . EP IMPLANTABLE DEVICE N/A 10/12/2016   Procedure: Pacemaker Implant;  Surgeon: Will Jorja LoaMartin Camnitz, MD;  Location: [facility];  Service: Cardiovascular;  Laterality: N/A;  . finfger surgery    . FINGER  SURGERY    . LTCS    . MYRINGOTOMY    . ORIF ANKLE FRACTURE Right 07/25/2015   Procedure: OPEN REDUCTION INTERNAL FIXATION (ORIF) RIGHT ANKLE ;  Surgeon: Sheral Apleyimothy D Murphy, MD;  Location: [facility];  Service: Orthopedics;  Laterality: Right;  . RHINOPLASTY    . TONSILLECTOMY      OB History    No data available       Home Medications    Prior to Admission medications   Medication Sig Start Date End Date Taking? Authorizing Provider  acetaminophen (TYLENOL) 500 MG tablet Take 1,000 mg by mouth every 6 (six) hours as needed for mild pain.    Historical Provider, MD  ALPRAZolam Prudy Feeler(XANAX) 0.5 MG tablet Take 0.25 mg by mouth 5 (five) times daily as needed for anxiety.     Historical Provider, MD  amphetamine-dextroamphetamine (ADDERALL) 20 MG tablet Take 20 mg by mouth 3 (three) times daily.    Historical Provider, MD  aspirin EC 81 MG tablet Take 81 mg by mouth daily.    Historical Provider, MD  atorvastatin (LIPITOR) 40 MG tablet Take 40 mg by mouth daily at 6 PM.     Historical Provider, MD  Brexpiprazole (REXULTI) 1 MG TABS Take 1 mg by mouth daily.    Historical Provider, MD  folic acid (FOLVITE) 1 MG tablet Take  1 tablet (1 mg total) by mouth daily. 09/19/16   Mir [name], MD  losartan (COZAAR) 100 MG tablet Take 100 mg by mouth daily.  06/12/14   Historical Provider, MD  metoprolol succinate (TOPROL XL) 25 MG 24 hr tablet Take 1 tablet (25 mg total) by mouth daily. 10/13/16   Amber Caryl Bis, NP  Multiple Vitamin (MULTIVITAMIN WITH MINERALS) TABS tablet Take 1 tablet by mouth daily. 09/19/16   Mir [name], MD  sertraline (ZOLOFT) 100 MG tablet Take 150 mg by mouth daily.     Historical Provider, MD  thiamine 100 MG tablet Take 1 tablet (100 mg total) by mouth daily. 09/19/16   Mir [name], MD  traZODone (DESYREL) 50 MG tablet Take 100-150 mg by mouth at bedtime as needed for sleep. For sleep. 06/30/16   Historical Provider, MD    Family  History Family History  Problem Relation Age of Onset  . Stroke Mother   . Kidney disease Mother   . Lung cancer Father     stage IV   . Stomach cancer Maternal Grandfather   . Colon cancer Paternal Grandfather     Social History Social History  Substance Use Topics  . Smoking status: Current Every Day Smoker    Packs/day: 1.00    Years: 15.00    Types: Cigarettes  . Smokeless tobacco: Never Used  . Alcohol use No     Comment: quit      Allergies   Codeine and Demerol [meperidine]   Review of Systems Review of Systems  Constitutional: Negative for chills and fever.  HENT: Negative for congestion and rhinorrhea.   Eyes: Negative for redness and visual disturbance.  Respiratory: Negative for shortness of breath and wheezing.   Cardiovascular: Negative for chest pain and palpitations.  Gastrointestinal: Negative for nausea and vomiting.  Genitourinary: Negative for dysuria and urgency.  Musculoskeletal: Negative for arthralgias and myalgias.  Skin: Negative for pallor and wound.  Neurological: Positive for syncope and headaches. Negative for dizziness.     Physical Exam Updated Vital Signs BP 126/96   Pulse 86   Temp 97.6 F (36.4 C) (Oral)   Resp 16   SpO2 92%   Physical Exam  Constitutional: She is oriented to person, place, and time. She appears well-developed and well-nourished. No distress.  HENT:  Head: Normocephalic and atraumatic.  Eyes: EOM are normal. Pupils are equal, round, and reactive to light.  Neck: Normal range of motion. Neck supple.  Cardiovascular: Normal rate and regular rhythm.  Exam reveals no gallop and no friction rub.   No murmur heard. Pulmonary/Chest: Effort normal. She has no wheezes. She has no rales.  Abdominal: Soft. She exhibits no distension. There is no tenderness.  Musculoskeletal: She exhibits no edema or tenderness.  Neurological: She is alert and oriented to person, place, and time.  Skin: Skin is warm and dry. She is  not diaphoretic.  Psychiatric: She has a normal mood and affect. Her behavior is normal.  Nursing note and vitals reviewed.    ED Treatments / Results  Labs (all labs ordered are listed, but only abnormal results are displayed) Labs Reviewed  CBG MONITORING, ED - Abnormal; Notable for the following:       Result Value   Glucose-Capillary 116 (*)    All other components within normal limits    EKG  EKG Interpretation None       Radiology No results found.  Procedures .Marland KitchenLaceration Repair Date/Time: 09/24/2016 5:50  PM Performed by: Melene Plan Authorized by: Melene Plan   Consent:    Consent obtained:  Verbal   Consent given by:  Patient   Risks discussed:  Infection, pain, poor cosmetic result and poor wound healing   Alternatives discussed:  No treatment Anesthesia (see MAR for exact dosages):    Anesthesia method:  None Laceration details:    Location:  Scalp   Scalp location:  Occipital   Length (cm):  3 Repair type:    Repair type:  Simple Pre-procedure details:    Preparation:  Patient was prepped and draped in usual sterile fashion Exploration:    Hemostasis achieved with:  Direct pressure   Wound exploration: entire depth of wound probed and visualized     Contaminated: no   Treatment:    Wound cleansed with: chlorihexidene.   Amount of cleaning:  Standard   Irrigation solution:  Sterile saline   Irrigation volume:  20   Irrigation method:  Pressure wash and syringe   Visualized foreign bodies/material removed: no   Skin repair:    Repair method:  Staples Approximation:    Approximation:  Close   Vermilion border: well-aligned   Post-procedure details:    Dressing:  Antibiotic ointment   Patient tolerance of procedure:  Tolerated well, no immediate complications     (including critical care time)  Medications Ordered in ED Medications - No data to display   Initial Impression / Assessment and Plan / ED Course  I have reviewed the triage  vital signs and the nursing notes.  Pertinent labs & imaging results that were available during my care of the patient were reviewed by me and considered in my medical decision making (see chart for details).  Clinical Course    55 yo F With a chief complaint of a scalp laceration. Patient decided to go on a bender this morning woke up in the bed. Has no idea how she got there what happened. Has a Holter monitor on for recurrent syncopal events. She is unsure of the exact events leading up to her fall. She is complaining also of left shoulder and elbow pain. On my exam patient has some pain to her left biceps tendon. No other areas of bony tenderness. She has a small laceration to her left occiput which was stapled. Tetanus is up-to-date.  Case was discussed with cardiology, Dr. Gae Gallop, he is unable to see the telemetry strip that was sent to the office however based on the note and patient not having any significant findings on her EKG or telemetry monitor felt that she was safe for discharge and outpatient follow-up is recommended in the phone note.   Ct head negative, Plain films of LUE negative.  D/c home.   7:06 AM:  I have discussed the diagnosis/risks/treatment options with the patient and believe the pt to be eligible for discharge home to follow-up with Cards, PCP. We also discussed returning to the ED immediately if new or worsening sx occur. We discussed the sx which are most concerning (e.g., sudden worsening pain, fever, inability to tolerate by mouth) that necessitate immediate return. Medications administered to the patient during their visit and any new prescriptions provided to the patient are listed below.  Medications given during this visit Medications - No data to display   The patient appears reasonably screen and/or stabilized for discharge and I doubt any other medical condition or other [facility] requiring further screening, evaluation, or treatment in the ED at this time  prior to discharge.    Final Clinical Impressions(s) / ED Diagnoses   Final diagnoses:  Laceration of head, initial encounter  Syncope and collapse    New Prescriptions Discharge Medication List as of 09/24/2016  6:52 PM       Melene Plan, DO 09/24/16 1913    Melene Plan, DO 10/22/16 1610

## 2016-09-24 NOTE — ED Notes (Addendum)
Pt aware of needed void pt is unable to void at this time

## 2016-09-24 NOTE — ED Notes (Signed)
Pt requesting to speak to MD. MD informed.

## 2016-09-24 NOTE — ED Notes (Signed)
Patient transported to CT 

## 2016-09-26 DIAGNOSIS — Z950 Presence of cardiac pacemaker: Secondary | ICD-10-CM

## 2016-09-26 HISTORY — DX: Presence of cardiac pacemaker: Z95.0

## 2016-09-27 ENCOUNTER — Telehealth: Payer: Self-pay | Admitting: Internal Medicine

## 2016-09-27 NOTE — Telephone Encounter (Signed)
PT  WENT  TO  ER  Friday  PM  FOR  EVAL AND  TX./CY

## 2016-09-27 NOTE — Telephone Encounter (Signed)
New Message  Pt voiced she fell friday and passed out.  Pt voiced she went to ER and she needs an appt to be seen ASAP.  Advised pt of 12/16/2016 of Brittney Cooper and 10/25/2016 or Brittney Cooper and pt declined.  Please f/u with pt

## 2016-09-27 NOTE — Telephone Encounter (Signed)
I spoke with the pt and she was seen in the ER 09/24/16 after waking up in her bed.  The pt had a laceration to her head and was not aware of events that led to her injury. Pt had seen Dr Graciela HusbandsKlein in October 2016 for EP consult and he recommended a monitor but due to not having insurance she could not proceed with testing.  I have arranged follow-up appointment with Dr Graciela HusbandsKlein on 10/05/16 at 10:00 to follow-up on monitor findings.  The pt was advised that she CANNOT drive at this time. Pt verbalized understanding of this instruction. The pt will continue to wear heart monitor and follow-up in the office.

## 2016-09-29 ENCOUNTER — Ambulatory Visit: Payer: 59 | Admitting: Nurse Practitioner

## 2016-09-29 NOTE — Telephone Encounter (Signed)
Noted  She had profound pause on monitor and though PP and  AV block occurred simultaneously, paper this week from [place] trial and accompanying editorial in pts >40 suggest pacing is appropirate  The caveat about intoxication from ED is noted

## 2016-10-05 ENCOUNTER — Encounter: Payer: Self-pay | Admitting: *Deleted

## 2016-10-05 ENCOUNTER — Ambulatory Visit (INDEPENDENT_AMBULATORY_CARE_PROVIDER_SITE_OTHER): Payer: 59 | Admitting: Internal Medicine

## 2016-10-05 ENCOUNTER — Encounter: Payer: Self-pay | Admitting: Internal Medicine

## 2016-10-05 VITALS — BP 120/86 | HR 95 | Ht 64.0 in | Wt 185.6 lb

## 2016-10-05 DIAGNOSIS — Z01812 Encounter for preprocedural laboratory examination: Secondary | ICD-10-CM

## 2016-10-05 DIAGNOSIS — R55 Syncope and collapse: Secondary | ICD-10-CM

## 2016-10-05 LAB — BASIC METABOLIC PANEL
BUN: 20 mg/dL (ref 7–25)
CHLORIDE: 98 mmol/L (ref 98–110)
CO2: 25 mmol/L (ref 20–31)
Calcium: 10.3 mg/dL (ref 8.6–10.4)
Creat: 0.81 mg/dL (ref 0.50–1.05)
Glucose, Bld: 102 mg/dL — ABNORMAL HIGH (ref 65–99)
POTASSIUM: 4.2 mmol/L (ref 3.5–5.3)
SODIUM: 137 mmol/L (ref 135–146)

## 2016-10-05 LAB — CBC WITH DIFFERENTIAL/PLATELET
BASOS ABS: 0 {cells}/uL (ref 0–200)
Basophils Relative: 0 %
EOS ABS: 112 {cells}/uL (ref 15–500)
EOS PCT: 1 %
HCT: 41.8 % (ref 35.0–45.0)
HEMOGLOBIN: 14.6 g/dL (ref 11.7–15.5)
Lymphocytes Relative: 22 %
Lymphs Abs: 2464 cells/uL (ref 850–3900)
MCH: 33.8 pg — AB (ref 27.0–33.0)
MCHC: 34.9 g/dL (ref 32.0–36.0)
MCV: 96.8 fL (ref 80.0–100.0)
MONOS PCT: 7 %
MPV: 9.6 fL (ref 7.5–12.5)
Monocytes Absolute: 784 cells/uL (ref 200–950)
NEUTROS PCT: 70 %
Neutro Abs: 7840 cells/uL — ABNORMAL HIGH (ref 1500–7800)
PLATELETS: 507 10*3/uL — AB (ref 140–400)
RBC: 4.32 MIL/uL (ref 3.80–5.10)
RDW: 14.3 % (ref 11.0–15.0)
WBC: 11.2 10*3/uL — ABNORMAL HIGH (ref 3.8–10.8)

## 2016-10-05 LAB — PROTIME-INR
INR: 1
PROTHROMBIN TIME: 10.8 s (ref 9.0–11.5)

## 2016-10-05 NOTE — Patient Instructions (Addendum)
Medication Instructions: - Your physician recommends that you continue on your current medications as directed. Please refer to the Current Medication list given to you today.  Labwork: - Your physician recommends that you have lab work today: BMP/CBC/ INR  Procedures/Testing: - Your physician has recommended that you have a pacemaker inserted. A pacemaker is a small device that is placed under the skin of your chest or abdomen to help control abnormal heart rhythms. This device uses electrical pulses to prompt the heart to beat at a normal rate. Pacemakers are used to treat heart rhythms that are too slow. Wire (leads) are attached to the pacemaker that goes into the chambers of you heart. This is done in the hospital and usually requires and overnight stay. Please see the instruction sheet given to you today for more information.  Follow-Up: - Your physician recommends that you schedule a follow-up appointment in: 10-14 days (from 10/12/16) for a wound check with the [facility].  Any Additional Special Instructions Will Be Listed Below (If Applicable).     If you need a refill on your cardiac medications before your next appointment, please call your pharmacy.

## 2016-10-05 NOTE — Progress Notes (Signed)
Patient Care Team: Cheron Schaumann, MD as PCP - General (Internal Medicine)   HPI  Brittney Cooper is a 55 y.o. female Seen at the hiatus of more than a year. She had a diagnosis of dysautonomic syncope. There. Other contributing complex medical comorbidities outlined in the old notes. Alcoholic induced pancreatitis is noted in the old chart 9/17  She had recurrent syncope with a monitor on. His was associated with a 6 second pause 2 . She had head trauma requiring laceration. Imaging was okay.  [facility]   Past Medical History:  Diagnosis Date  . ADHD (attention deficit hyperactivity disorder)   . 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 Anxiety   . Arthritis   . Bipolar disorder (HCC)   . Chronic pain    back  . Depression   . Fatty liver   . Fracture of lateral malleolus of right fibula   . Hypertension   . OCD (obsessive compulsive disorder)   . Panic attack   . 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 Syncope    neurocardiogenic    Past Surgical History:  Procedure Laterality Date  . ABDOMINAL HYSTERECTOMY    . ANTERIOR AND POSTERIOR REPAIR    . BUNIONECTOMY Bilateral   . finfger surgery    . FINGER SURGERY    . LTCS    . MYRINGOTOMY    . ORIF ANKLE FRACTURE Right 07/25/2015   Procedure: OPEN REDUCTION INTERNAL FIXATION (ORIF) RIGHT ANKLE ;  Surgeon: Sheral Apley, MD;  Location: [facility];  Service: Orthopedics;  Laterality: Right;  . RHINOPLASTY    . TONSILLECTOMY      Current Outpatient Prescriptions  Medication Sig Dispense Refill  . ALPRAZolam (XANAX) 0.5 MG tablet Take 0.25 mg by mouth daily. Take every day per patient     . amphetamine-dextroamphetamine (ADDERALL) 20 MG tablet Take 20 mg by mouth 3 (three) times daily.    Marland Kitchen aspirin EC 81 MG tablet Take 81 mg by mouth daily.    Marland Kitchen atorvastatin (LIPITOR) 40 MG  tablet Take 40 mg by mouth daily.    . folic acid (FOLVITE) 1 MG tablet Take 1 tablet (1 mg total) by mouth daily. 30 tablet 0  . losartan (COZAAR) 100 MG tablet Take 100 mg by mouth daily.     . Multiple Vitamin (MULTIVITAMIN WITH MINERALS) TABS tablet Take 1 tablet by mouth daily. 30 tablet 0  . sertraline (ZOLOFT) 100 MG tablet Take 150 mg by mouth daily.     Marland Kitchen thiamine 100 MG tablet Take 1 tablet (100 mg total) by mouth daily. 30 tablet 0  . traZODone (DESYREL) 50 MG tablet Take 100-150 mg by mouth at bedtime as needed for sleep. For sleep.     No current facility-administered medications for this visit.     Allergies  Allergen Reactions  . Codeine Nausea And Vomiting  . Demerol [Meperidine] Other (See Comments)    Hallucinations       Review of Systems negative except from HPI and PMH  Physical Exam BP 120/86   Pulse 95   Ht 5\' 4"  (1.626 m)   Wt 185 lb 9.6 oz (84.2 kg)   SpO2 97%   BMI 31.86 kg/m  Well developed and well nourished in no acute distress HENT normal E scleral and icterus clear Neck Supple  JVP flat; carotids brisk and full Clear to ausculation  *Regular rate and rhythm, no murmurs gallops or rub Soft with active bowel sounds No clubbing cyanosis  Edema Alert and oriented, grossly normal motor and sensory function Skin Warm and Dry    Assessment and  Plan  Syncope neurally mediated  Complete Heart BLock    The patient serendipitously had a monitor on with her most recent episode demonstrating 2 successive 6 second pauses. 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 Based on the ISSUE-3 and the recently reported [place] trial there are now supportive data to the use of CLS pacing with significant decrease in recurrent syncope frequency and/or prolonging prodrome to help avoid syncope without warning   We discussed extensively the issues of dysautonomia, the physiology of  orthstasis and positional stress.  We discussed the role of salt and water repletion, the importance of exercise, often needing to be started in the recumbent position, and the awareness of triggers and the role of ambient heat and dehydration   The benefits and risks were reviewed including but not limited to death,  perforation, infection, lead dislodgement and device malfunction.  The patient understands agrees and is willing to proceed.  As my schedule is tight, Dr. Derek JackWC has seen her and will implant her device

## 2016-10-06 ENCOUNTER — Other Ambulatory Visit: Payer: Self-pay | Admitting: Cardiology

## 2016-10-06 DIAGNOSIS — I442 Atrioventricular block, complete: Secondary | ICD-10-CM

## 2016-10-11 ENCOUNTER — Emergency Department (HOSPITAL_COMMUNITY): Payer: 59

## 2016-10-11 ENCOUNTER — Observation Stay (HOSPITAL_COMMUNITY)
Admission: EM | Admit: 2016-10-11 | Discharge: 2016-10-13 | DRG: 244 | Disposition: A | Discharge status: Home / Self Care | Payer: 59 | Attending: Cardiology | Admitting: Cardiology

## 2016-10-11 ENCOUNTER — Telehealth: Payer: Self-pay | Admitting: Nurse Practitioner

## 2016-10-11 ENCOUNTER — Encounter (HOSPITAL_COMMUNITY): Payer: Self-pay | Admitting: Emergency Medicine

## 2016-10-11 ENCOUNTER — Other Ambulatory Visit: Payer: Self-pay

## 2016-10-11 DIAGNOSIS — I455 Other specified heart block: Secondary | ICD-10-CM

## 2016-10-11 DIAGNOSIS — R52 Pain, unspecified: Secondary | ICD-10-CM

## 2016-10-11 DIAGNOSIS — Z7982 Long term (current) use of aspirin: Secondary | ICD-10-CM | POA: Insufficient documentation

## 2016-10-11 DIAGNOSIS — G901 Familial dysautonomia [Riley-Day]: Secondary | ICD-10-CM | POA: Diagnosis not present

## 2016-10-11 DIAGNOSIS — F319 Bipolar disorder, unspecified: Secondary | ICD-10-CM | POA: Insufficient documentation

## 2016-10-11 DIAGNOSIS — Z79899 Other long term (current) drug therapy: Secondary | ICD-10-CM | POA: Insufficient documentation

## 2016-10-11 DIAGNOSIS — F419 Anxiety disorder, unspecified: Secondary | ICD-10-CM | POA: Insufficient documentation

## 2016-10-11 DIAGNOSIS — F909 Attention-deficit hyperactivity disorder, unspecified type: Secondary | ICD-10-CM | POA: Diagnosis not present

## 2016-10-11 DIAGNOSIS — R002 Palpitations: Secondary | ICD-10-CM | POA: Diagnosis present

## 2016-10-11 DIAGNOSIS — I10 Essential (primary) hypertension: Secondary | ICD-10-CM | POA: Insufficient documentation

## 2016-10-11 DIAGNOSIS — I442 Atrioventricular block, complete: Principal | ICD-10-CM

## 2016-10-11 DIAGNOSIS — F17210 Nicotine dependence, cigarettes, uncomplicated: Secondary | ICD-10-CM | POA: Diagnosis not present

## 2016-10-11 DIAGNOSIS — E785 Hyperlipidemia, unspecified: Secondary | ICD-10-CM | POA: Diagnosis not present

## 2016-10-11 DIAGNOSIS — Z95818 Presence of other cardiac implants and grafts: Secondary | ICD-10-CM

## 2016-10-11 DIAGNOSIS — F429 Obsessive-compulsive disorder, unspecified: Secondary | ICD-10-CM | POA: Diagnosis not present

## 2016-10-11 LAB — BASIC METABOLIC PANEL
Anion gap: 11 (ref 5–15)
BUN: 21 mg/dL — AB (ref 6–20)
CHLORIDE: 102 mmol/L (ref 101–111)
CO2: 24 mmol/L (ref 22–32)
CREATININE: 0.98 mg/dL (ref 0.44–1.00)
Calcium: 9.9 mg/dL (ref 8.9–10.3)
GFR calc Af Amer: >60 mL/min (ref >60)
GFR calc non Af Amer: >60 mL/min (ref >60)
Glucose, Bld: 134 mg/dL — ABNORMAL HIGH (ref 65–99)
Potassium: 4.3 mmol/L (ref 3.5–5.1)
Sodium: 137 mmol/L (ref 135–145)

## 2016-10-11 LAB — CBC
HCT: 41.5 % (ref 36.0–46.0)
Hemoglobin: 14.3 g/dL (ref 12.0–15.0)
MCH: 33.5 pg (ref 26.0–34.0)
MCHC: 34.5 g/dL (ref 30.0–36.0)
MCV: 97.2 fL (ref 78.0–100.0)
PLATELETS: 296 10*3/uL (ref 150–400)
RBC: 4.27 MIL/uL (ref 3.87–5.11)
RDW: 14.2 % (ref 11.5–15.5)
WBC: 13.5 10*3/uL — ABNORMAL HIGH (ref 4.0–10.5)

## 2016-10-11 LAB — I-STAT TROPONIN, ED: Troponin i, poc: 0 ng/mL (ref 0.00–0.08)

## 2016-10-11 NOTE — ED Notes (Signed)
EDP at bedside  

## 2016-10-11 NOTE — Telephone Encounter (Signed)
   Pt called this evening reporting that she has been having frequent irregularity noted to her heart rhythm today.  Her rate has not been particularly fast.  She does have intermittent dizziness, which is not necessarily new for her.  She has a prior h/o syncope dysautonomic syncope with six second pauses noted on event monitoring.  She is scheduled for CLS pacing tomorrow.  After discussion about options for management tonight, she prefers to come into the ED for ecg and labs and observation if appropriate.  Nicolasa Duckinghristopher Regie Bunner, NP 10/11/2016, 5:31 PM

## 2016-10-11 NOTE — ED Triage Notes (Signed)
Patient from EMS from home. 1700 patient started having palpitations, hx of runs of complete block. Supposed to have pacer placed tomorrow morning, norm is for her to have syncope. Sinus arrhthymias 110s, 133/89, 91, 96 % room air. 20 L wrist.

## 2016-10-11 NOTE — ED Provider Notes (Signed)
[facility]-EMERGENCY DEPT Provider Note   CSN: [id number] Arrival date & time: 10/11/16  1932     History   Chief Complaint Chief Complaint  Patient presents with  . Palpitations    HPI BAYLIN CABAL is a 55 y.o. female.   Palpitations   This is a recurrent problem. The current episode started 3 to 5 hours ago. The problem occurs constantly. The problem has been gradually improving. Associated symptoms include irregular heartbeat, dizziness (chronic) and shortness of breath. Pertinent negatives include no diaphoresis, no fever, no chest pain, no exertional chest pressure, no syncope, no abdominal pain, no nausea, no vomiting, no back pain, no lower extremity edema, no cough and no sputum production. She has tried nothing for the symptoms. Risk factors include smoking/tobacco exposure. Past medical history comments: syncope with sinus pauses; scheduled for pacer tomorrow..    Past Medical History:  Diagnosis Date  . ADHD (attention deficit hyperactivity disorder)   . 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 Anxiety   . Arthritis   . Bipolar disorder (HCC)   . Chronic pain    back  . Depression   . Fatty liver   . Fracture of lateral malleolus of right fibula   . Hypertension   . OCD (obsessive compulsive disorder)   . Panic attack   . 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 Syncope    neurocardiogenic    Patient Active Problem List   Diagnosis Date Noted  . Acute pancreatitis 09/15/2016  . Alcohol abuse 09/15/2016  . ADHD (attention deficit hyperactivity disorder) 09/15/2016  . Tobacco use 09/15/2016  . Anxiety 09/15/2016  . Hypokalemia 09/15/2016  . Pancreatitis 07/09/2016  . Alcoholic pancreatitis 07/08/2016  . Alcohol withdrawal (HCC) 07/08/2016  . Bipolar disorder (HCC) 07/08/2016  . Essential hypertension 07/08/2016  . Left shoulder pain 09/09/2014    Past Surgical History:  Procedure  Laterality Date  . ABDOMINAL HYSTERECTOMY    . ANTERIOR AND POSTERIOR REPAIR    . BUNIONECTOMY Bilateral   . finfger surgery    . FINGER SURGERY    . LTCS    . MYRINGOTOMY    . ORIF ANKLE FRACTURE Right 07/25/2015   Procedure: OPEN REDUCTION INTERNAL FIXATION (ORIF) RIGHT ANKLE ;  Surgeon: Sheral Apley, MD;  Location: [facility];  Service: Orthopedics;  Laterality: Right;  . RHINOPLASTY    . TONSILLECTOMY      OB History    No data available       Home Medications    Prior to Admission medications   Medication Sig Start Date End Date Taking? Authorizing Provider  acetaminophen (TYLENOL) 500 MG tablet Take 1,000 mg by mouth every 6 (six) hours as needed for mild pain.    Historical Provider, MD  ALPRAZolam Prudy Feeler) 0.5 MG tablet Take 0.25 mg by mouth 5 (five) times daily as needed for anxiety.     Historical Provider, MD  amphetamine-dextroamphetamine (ADDERALL) 20 MG tablet Take 20 mg by mouth 3 (three) times daily.    Historical Provider, MD  aspirin EC 81 MG tablet Take 81 mg by mouth daily.    Historical Provider, MD  atorvastatin (LIPITOR) 40 MG tablet Take 40 mg by mouth daily at 6 PM.     Historical Provider, MD  Brexpiprazole (REXULTI) 1 MG TABS Take 1 mg by mouth daily.    Historical Provider, MD  folic acid (FOLVITE) 1  MG tablet Take 1 tablet (1 mg total) by mouth daily. 09/19/16   Mir [name], MD  losartan (COZAAR) 100 MG tablet Take 100 mg by mouth daily.  06/12/14   Historical Provider, MD  Multiple Vitamin (MULTIVITAMIN WITH MINERALS) TABS tablet Take 1 tablet by mouth daily. 09/19/16   Mir [name], MD  sertraline (ZOLOFT) 100 MG tablet Take 150 mg by mouth daily.     Historical Provider, MD  thiamine 100 MG tablet Take 1 tablet (100 mg total) by mouth daily. 09/19/16   Mir [name], MD  traZODone (DESYREL) 50 MG tablet Take 100-150 mg by mouth at bedtime as needed for sleep. For sleep. 06/30/16   Historical Provider, MD     Family History Family History  Problem Relation Age of Onset  . Stroke Mother   . Kidney disease Mother   . Cancer Father     stage IV lung  . Cancer Maternal Grandfather     Stomach cancer  . Cancer Paternal Grandfather     Colon cancer    Social History Social History  Substance Use Topics  . Smoking status: Current Every Day Smoker    Packs/day: 1.00    Years: 15.00    Types: Cigarettes  . Smokeless tobacco: Never Used  . Alcohol use 0.0 oz/week     Allergies   Codeine and Demerol [meperidine]   Review of Systems Review of Systems  Constitutional: Negative for chills, diaphoresis and fever.  HENT: Negative for ear pain and sore throat.   Eyes: Negative for pain and visual disturbance.  Respiratory: Positive for shortness of breath. Negative for cough and sputum production.   Cardiovascular: Positive for palpitations. Negative for chest pain and syncope.  Gastrointestinal: Negative for abdominal pain, nausea and vomiting.  Genitourinary: Negative for dysuria and hematuria.  Musculoskeletal: Negative for arthralgias and back pain.  Skin: Negative for color change and rash.  Neurological: Positive for dizziness (chronic). Negative for seizures and syncope.  All other systems reviewed and are negative.    Physical Exam Updated Vital Signs BP 137/89   Pulse 99   Resp 26   Ht 5\' 4"  (1.626 m)   Wt 83.9 kg   SpO2 96%   BMI 31.76 kg/m   Physical Exam  Constitutional: She is oriented to person, place, and time. She appears well-developed and well-nourished.  HENT:  Head: Normocephalic and atraumatic.  Eyes: Conjunctivae and EOM are normal. Pupils are equal, round, and reactive to light.  Neck: Normal range of motion. Neck supple.  Cardiovascular: Normal rate and regular rhythm.   Pulmonary/Chest: Effort normal and breath sounds normal.  Abdominal: Soft. There is no tenderness.  Musculoskeletal: She exhibits no edema.  Neurological: She is alert and  oriented to person, place, and time.  Skin: Skin is warm and dry.  Psychiatric: She has a normal mood and affect.  Nursing note and vitals reviewed.    ED Treatments / Results  Labs (all labs ordered are listed, but only abnormal results are displayed) Labs Reviewed  BASIC METABOLIC PANEL - Abnormal; Notable for the following:       Result Value   Glucose, Bld 134 (*)    BUN 21 (*)    All other components within normal limits  CBC - Abnormal; Notable for the following:    WBC 13.5 (*)    All other components within normal limits  I-STAT TROPOININ, ED    EKG  EKG Interpretation  Date/Time:  Monday October 11 2016 19:40:29 EDT Ventricular Rate:  93 PR Interval:    QRS Duration: 83 QT Interval:  334 QTC Calculation: 416 R Axis:   7 Text Interpretation:  Sinus rhythm Atrial premature complex No STEMI.  Confirmed by LONG MD, JOSHUA [phone]([id number]) on 10/11/2016 7:50:04 PM       Radiology No results found.  Procedures Procedures (including critical care time)  Medications Ordered in ED Medications - No data to display   Initial Impression / Assessment and Plan / ED Course  I have reviewed the triage vital signs and the nursing notes.  Pertinent labs & imaging results that were available during my care of the patient were reviewed by me and considered in my medical decision making (see chart for details).  Clinical Course   Ms. Georg Ruddlemico is a 55 year old female with past medical history significant for anxiety, bipolar, neurocardiogenic syncope, pancreatitis who presents for palpitations and dizziness.  She states the palpitations are similar to previous symptoms that resulted in syncope.  Additionally patient has scheduled pacemaker placement tomorrow.  EKG obtained, demonstrates normal sinus rhythm with frequent premature atrial complexes.  Labs obtained including CBC, BMP, troponin.  Results unremarkable.  Chest x-ray obtained, personally reviewed by me, demonstrates no  acute cardiac or pulmonary processes.  The patient's prior presentation of similar symptoms prior to her syncopal episodes in fact she has scheduled pacemaker placement tomorrow, the patient is admitted to cardiology for further workup while awaiting pacer placement.  Final Clinical Impressions(s) / ED Diagnoses   Final diagnoses:  Palpitations    New Prescriptions New Prescriptions   No medications on file     Garey HamMichael E Graysen Woodyard, MD 10/12/16 0002    Maia PlanJoshua G Long, MD 10/12/16 [id number]

## 2016-10-11 NOTE — H&P (Signed)
Physician History and Physical    Brittney Cooper MRN: [id number] DOB/AGE: 1961-03-14 55 y.o. Admit date: 10/11/2016  Primary Cardiologist: Dr. Graciela Husbands  HPI: 19 yo with history of syncope presented to ER with palpitations.  Patient has history of syncope, noted to have pauses up to 6 seconds on recent cardiac monitor.  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  Plan for PPM implantation tomorrow with Dr. Elberta Fortis.    She called the office today with palpitations and intermittent "dizziness," no syncope.  She was told to go to the ER.  In the ER, HR was stable and she had PACs.  She said that she was not comfortable going home, so we will admit her overnight until her PPM placement tomorrow.   PMH: 1. Syncope: Event monitor showed two 6 second pauses associated with syncope.  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  2. H/o ETOH pancreatitis 3. Bipolar disorder 4. HTN 5. Hyperlipidemia  Review of systems complete and found to be negative unless listed above   Family History  Problem Relation Age of Onset  . Stroke Mother   . Kidney disease Mother   . Cancer Father     stage IV lung  . Cancer Maternal Grandfather     Stomach cancer  . Cancer Paternal Grandfather     Colon cancer    Social History   Social History  . Marital status: Married    Spouse name: N/A  . Number of children: N/A  . Years of education: N/A   Occupational History  . RN    Social History Main Topics  . Smoking status: Current Every Day Smoker    Packs/day: 1.00    Years: 15.00    Types: Cigarettes  . Smokeless tobacco: Never Used  . Alcohol use 0.0 oz/week  . Drug use: No  . Sexual activity: Not on file   Other Topics Concern  . Not on file   Social History Narrative  . No narrative on file     Physical Exam: Blood pressure 131/90, pulse 85, temperature 98.8 F (37.1 C), temperature source Oral, resp. rate (!) 28, height 5\' 4"  (1.626 m), weight 185 lb (83.9 kg), SpO2 94 %.  General:  NAD Neck: No JVD, no thyromegaly or thyroid nodule.  Lungs: Clear to auscultation bilaterally with normal respiratory effort. CV: Nondisplaced PMI.  Heart regular S1/S2, no S3/S4, no murmur.  No peripheral edema.  No carotid bruit.  Normal pedal pulses.  Abdomen: Soft, nontender, no hepatosplenomegaly, no distention.  Skin: Intact without lesions or rashes.  Neurologic: Alert and oriented x 3.  Psych: Normal affect. Extremities: No clubbing or cyanosis.  HEENT: Normal.   Labs:   Lab Results  Component Value Date   WBC 13.5 (H) 10/11/2016   HGB 14.3 10/11/2016   HCT 41.5 10/11/2016   MCV 97.2 10/11/2016   PLT 296 10/11/2016    Recent Labs Lab 10/11/16 1958  NA 137  K 4.3  CL 102  CO2 24  BUN 21*  CREATININE 0.98  CALCIUM 9.9  GLUCOSE 134*   No results found for: LDLDIRECT    Radiology: - CXR: No active disease  EKG: NSR, 1 PAC  ASSESSMENT AND PLAN: 55 yo with history of syncope presented to ER with palpitations.  1. Syncope: With sinus pauses.  Plan for PPM implantation tomorrow.  2. Palpitations: Triggered ER trip this evening.  Based on telemetry in ER, likely due to PACs.  She has had stable rhythm under  observation in ER but says she does not feel comfortable going home.  Thus[facility]   Signed: Marca AnconaDalton Breanda Greenlaw 10/11/2016, 11:36 PM

## 2016-10-12 ENCOUNTER — Ambulatory Visit (HOSPITAL_COMMUNITY): Admission: RE | Admit: 2016-10-12 | Payer: 59 | Source: Ambulatory Visit | Admitting: Cardiology

## 2016-10-12 ENCOUNTER — Encounter (HOSPITAL_COMMUNITY): Payer: Self-pay | Admitting: Cardiology

## 2016-10-12 ENCOUNTER — Encounter (HOSPITAL_COMMUNITY)
Admission: EM | Disposition: A | Discharge status: Home / Self Care | Payer: Self-pay | Source: Home / Self Care | Attending: Emergency Medicine

## 2016-10-12 DIAGNOSIS — F909 Attention-deficit hyperactivity disorder, unspecified type: Secondary | ICD-10-CM | POA: Diagnosis not present

## 2016-10-12 DIAGNOSIS — I442 Atrioventricular block, complete: Secondary | ICD-10-CM | POA: Diagnosis not present

## 2016-10-12 DIAGNOSIS — I10 Essential (primary) hypertension: Secondary | ICD-10-CM | POA: Diagnosis not present

## 2016-10-12 DIAGNOSIS — R002 Palpitations: Secondary | ICD-10-CM | POA: Diagnosis not present

## 2016-10-12 DIAGNOSIS — R001 Bradycardia, unspecified: Secondary | ICD-10-CM | POA: Diagnosis not present

## 2016-10-12 HISTORY — PX: EP IMPLANTABLE DEVICE: SHX172B

## 2016-10-12 LAB — BASIC METABOLIC PANEL
Anion gap: 11 (ref 5–15)
BUN: 21 mg/dL — AB (ref 6–20)
CALCIUM: 9.6 mg/dL (ref 8.9–10.3)
CO2: 25 mmol/L (ref 22–32)
CREATININE: 0.86 mg/dL (ref 0.44–1.00)
Chloride: 102 mmol/L (ref 101–111)
GFR calc Af Amer: >60 mL/min (ref >60)
GLUCOSE: 96 mg/dL (ref 65–99)
Potassium: 3.6 mmol/L (ref 3.5–5.1)
SODIUM: 138 mmol/L (ref 135–145)

## 2016-10-12 LAB — TSH: TSH: 3.832 u[IU]/mL (ref 0.350–4.500)

## 2016-10-12 LAB — CBC
HEMATOCRIT: 39.4 % (ref 36.0–46.0)
Hemoglobin: 13.2 g/dL (ref 12.0–15.0)
MCH: 32.9 pg (ref 26.0–34.0)
MCHC: 33.5 g/dL (ref 30.0–36.0)
MCV: 98.3 fL (ref 78.0–100.0)
PLATELETS: 270 10*3/uL (ref 150–400)
RBC: 4.01 MIL/uL (ref 3.87–5.11)
RDW: 14.3 % (ref 11.5–15.5)
WBC: 11 10*3/uL — AB (ref 4.0–10.5)

## 2016-10-12 LAB — SURGICAL PCR SCREEN
MRSA, PCR: NEGATIVE
STAPHYLOCOCCUS AUREUS: POSITIVE — AB

## 2016-10-12 SURGERY — PACEMAKER IMPLANT
Anesthesia: LOCAL

## 2016-10-12 MED ORDER — SODIUM CHLORIDE 0.9 % IV SOLN
INTRAVENOUS | Status: DC | PRN
  Administered 2016-10-12: 50 mL/h via INTRAVENOUS

## 2016-10-12 MED ORDER — ACETAMINOPHEN 325 MG PO TABS: 325.0000 mg | ORAL_TABLET | ORAL | Status: DC | PRN

## 2016-10-12 MED ORDER — OXYCODONE-ACETAMINOPHEN 5-325 MG PO TABS
1.0000 | ORAL_TABLET | Freq: Four times a day (QID) | ORAL | Status: DC | PRN
  Administered 2016-10-12 – 2016-10-13 (×4): 1 via ORAL
  Filled 2016-10-12 (×4): qty 1

## 2016-10-12 MED ORDER — MIDAZOLAM HCL 5 MG/5ML IJ SOLN
INTRAMUSCULAR | Status: DC | PRN
  Administered 2016-10-12 (×5): 1 mg via INTRAVENOUS
  Administered 2016-10-12: 2 mg via INTRAVENOUS
  Administered 2016-10-12: 1 mg via INTRAVENOUS

## 2016-10-12 MED ORDER — ASPIRIN 81 MG PO CHEW: 324.0000 mg | CHEWABLE_TABLET | ORAL | Status: AC

## 2016-10-12 MED ORDER — CEFAZOLIN IN D5W 1 GM/50ML IV SOLN
1.0000 g | Freq: Four times a day (QID) | INTRAVENOUS | Status: AC
  Administered 2016-10-12 – 2016-10-13 (×3): 1 g via INTRAVENOUS
  Filled 2016-10-12 (×3): qty 50

## 2016-10-12 MED ORDER — ALPRAZOLAM 0.25 MG PO TABS
0.2500 mg | ORAL_TABLET | Freq: Every day | ORAL | Status: DC | PRN
  Administered 2016-10-12: 0.25 mg via ORAL
  Filled 2016-10-12: qty 1

## 2016-10-12 MED ORDER — FOLIC ACID 1 MG PO TABS
1.0000 mg | ORAL_TABLET | Freq: Every day | ORAL | Status: DC
  Administered 2016-10-12 – 2016-10-13 (×2): 1 mg via ORAL
  Filled 2016-10-12 (×2): qty 1

## 2016-10-12 MED ORDER — BREXPIPRAZOLE 2 MG PO TABS
1.0000 mg | ORAL_TABLET | Freq: Every day | ORAL | Status: DC
  Administered 2016-10-12: 1 mg via ORAL
  Filled 2016-10-12 (×2): qty 0.5

## 2016-10-12 MED ORDER — SERTRALINE HCL 50 MG PO TABS
150.0000 mg | ORAL_TABLET | Freq: Every day | ORAL | Status: DC
  Administered 2016-10-12 – 2016-10-13 (×2): 150 mg via ORAL
  Filled 2016-10-12 (×2): qty 1

## 2016-10-12 MED ORDER — VITAMIN B-1 100 MG PO TABS
100.0000 mg | ORAL_TABLET | Freq: Every day | ORAL | Status: DC
  Administered 2016-10-12 – 2016-10-13 (×2): 100 mg via ORAL
  Filled 2016-10-12 (×2): qty 1

## 2016-10-12 MED ORDER — FENTANYL CITRATE (PF) 100 MCG/2ML IJ SOLN
INTRAMUSCULAR | Status: AC
  Filled 2016-10-12: qty 2

## 2016-10-12 MED ORDER — MIDAZOLAM HCL 5 MG/5ML IJ SOLN
INTRAMUSCULAR | Status: AC
  Filled 2016-10-12: qty 5

## 2016-10-12 MED ORDER — ASPIRIN EC 81 MG PO TBEC
81.0000 mg | DELAYED_RELEASE_TABLET | Freq: Every day | ORAL | Status: DC
  Administered 2016-10-13: 81 mg via ORAL
  Filled 2016-10-12: qty 1

## 2016-10-12 MED ORDER — FENTANYL CITRATE (PF) 100 MCG/2ML IJ SOLN
INTRAMUSCULAR | Status: DC | PRN
  Administered 2016-10-12 (×5): 25 ug via INTRAVENOUS

## 2016-10-12 MED ORDER — ASPIRIN 300 MG RE SUPP: 300.0000 mg | RECTAL | Status: AC

## 2016-10-12 MED ORDER — CEFAZOLIN SODIUM-DEXTROSE 2-3 GM-% IV SOLR
INTRAVENOUS | Status: DC | PRN
  Administered 2016-10-12: 2 g via INTRAVENOUS

## 2016-10-12 MED ORDER — ACETAMINOPHEN 500 MG PO TABS: 500.0000 mg | ORAL_TABLET | Freq: Four times a day (QID) | ORAL | Status: DC | PRN

## 2016-10-12 MED ORDER — ATORVASTATIN CALCIUM 40 MG PO TABS
40.0000 mg | ORAL_TABLET | Freq: Every day | ORAL | Status: DC
  Administered 2016-10-12: 40 mg via ORAL
  Filled 2016-10-12: qty 1

## 2016-10-12 MED ORDER — ONDANSETRON HCL 4 MG/2ML IJ SOLN: 4.0000 mg | Freq: Four times a day (QID) | INTRAMUSCULAR | Status: DC | PRN

## 2016-10-12 MED ORDER — LIDOCAINE HCL (PF) 1 % IJ SOLN
INTRAMUSCULAR | Status: AC
  Filled 2016-10-12: qty 60

## 2016-10-12 MED ORDER — HEPARIN (PORCINE) IN NACL 2-0.9 UNIT/ML-% IJ SOLN
INTRAMUSCULAR | Status: AC
  Filled 2016-10-12: qty 500

## 2016-10-12 MED ORDER — ASPIRIN EC 81 MG PO TBEC: 81.0000 mg | DELAYED_RELEASE_TABLET | Freq: Every day | ORAL | Status: DC

## 2016-10-12 MED ORDER — OXYCODONE HCL 5 MG PO TABS
5.0000 mg | ORAL_TABLET | Freq: Four times a day (QID) | ORAL | Status: DC | PRN
  Administered 2016-10-12: 5 mg via ORAL
  Filled 2016-10-12: qty 1

## 2016-10-12 MED ORDER — CEFAZOLIN SODIUM-DEXTROSE 2-4 GM/100ML-% IV SOLN
INTRAVENOUS | Status: AC
  Filled 2016-10-12: qty 100

## 2016-10-12 MED ORDER — LIDOCAINE HCL (PF) 1 % IJ SOLN
INTRAMUSCULAR | Status: DC | PRN
  Administered 2016-10-12: 30 mL

## 2016-10-12 MED ORDER — SODIUM CHLORIDE 0.9 % IV SOLN
INTRAVENOUS | Status: DC
  Administered 2016-10-12: 06:00:00 via INTRAVENOUS

## 2016-10-12 MED ORDER — CEFAZOLIN SODIUM-DEXTROSE 2-4 GM/100ML-% IV SOLN
2.0000 g | INTRAVENOUS | Status: DC
  Filled 2016-10-12: qty 100

## 2016-10-12 MED ORDER — NITROGLYCERIN 0.4 MG SL SUBL: 0.4000 mg | SUBLINGUAL_TABLET | SUBLINGUAL | Status: DC | PRN

## 2016-10-12 MED ORDER — SODIUM CHLORIDE 0.9 % IR SOLN
80.0000 mg | Status: AC
  Administered 2016-10-12: 80 mg
  Filled 2016-10-12: qty 2

## 2016-10-12 MED ORDER — SODIUM CHLORIDE 0.9 % IR SOLN
Status: AC
  Filled 2016-10-12: qty 2

## 2016-10-12 MED ORDER — TRAZODONE HCL 100 MG PO TABS: 100.0000 mg | ORAL_TABLET | Freq: Every evening | ORAL | Status: DC | PRN

## 2016-10-12 MED ORDER — LOSARTAN POTASSIUM 50 MG PO TABS
100.0000 mg | ORAL_TABLET | Freq: Every day | ORAL | Status: DC
  Administered 2016-10-12 – 2016-10-13 (×2): 100 mg via ORAL
  Filled 2016-10-12 (×2): qty 2

## 2016-10-12 MED ORDER — ADULT MULTIVITAMIN W/MINERALS CH
1.0000 | ORAL_TABLET | Freq: Every day | ORAL | Status: DC
  Administered 2016-10-12 – 2016-10-13 (×2): 1 via ORAL
  Filled 2016-10-12 (×2): qty 1

## 2016-10-12 MED ORDER — HEPARIN (PORCINE) IN NACL 2-0.9 UNIT/ML-% IJ SOLN
INTRAMUSCULAR | Status: DC | PRN
  Administered 2016-10-12: 500 mL

## 2016-10-12 SURGICAL SUPPLY — 7 items
CABLE SURGICAL S-101-97-12 (CABLE) ×1 IMPLANT
LEAD SOLIA S PRO MRI 45 (Lead) ×1 IMPLANT
LEAD SOLIA S PRO MRI 53 (Lead) ×1 IMPLANT
PAD DEFIB LIFELINK (PAD) ×1 IMPLANT
PPM ELUNA DR-T 394969 (Pacemaker) ×1 IMPLANT
SHEATH CLASSIC 7F (SHEATH) ×2 IMPLANT
TRAY PACEMAKER INSERTION (PACKS) ×1 IMPLANT

## 2016-10-12 NOTE — Discharge Summary (Signed)
ELECTROPHYSIOLOGY PROCEDURE DISCHARGE SUMMARY    Patient ID: Brittney Cooper Marschner,  MRN: [id number], DOB/AGE: 53/08/1961 53 y.o.  Admit date: 10/11/2016 Discharge date: 10/13/2016  Primary Care Physician: Doreatha MartinVELAZQUEZ,[name], MD Electrophysiologist: Graciela HusbandsKlein  Primary Discharge Diagnosis:  Symptomatic complete heart block status post pacemaker implantation this admission  Secondary Discharge Diagnosis:  1.  Dysautonomia 2.  ETOH pancreatitis 3.  HTN 4.  Bipolar 5.  Hyperlipidemia   Allergies  Allergen Reactions  . Codeine Nausea And Vomiting  . Demerol [Meperidine] Other (See Comments)    Hallucinations      Procedures This Admission:  1.  Implantation of a Biotronik dual chamber PPM on 10/12/16 by Dr Elberta Fortisamnitz.  See op note for full details.  There were no immediate post procedure complications. 2.  CXR on 10/13/16 demonstrated no pneumothorax status post device implantation.   Brief HPI: Brittney Cooper Gladney is a 55 y.o. female with a past medical history as outlined above. She has recurrent syncope and dysautonomia with documented complete heart block and P-P prolongation. It was felt that CLS pacing could be of benefit.  The patient has had symptomatic bradycardia without reversible causes identified.  Risks, benefits, and alternatives to PPM implantation were reviewed with the patient who wished to proceed.  The patient presented to the hospital the night before admission with complaints of palpitations and was found to have sinus rhythm with PAC's.   Hospital Course:  The patient was admitted and underwent implantation of a Biotronik dual chamber pacemaker with details as outlined above.  She  was monitored on telemetry overnight which demonstrated sinus rhythm.  Left chest was without hematoma or ecchymosis.  The device was interrogated and found to be functioning normally.  CXR was obtained and demonstrated no pneumothorax status post device implantation.  Wound care, arm mobility, and  restrictions were reviewed with the patient.  The patient was examined by Dr Elberta Fortisamnitz and considered stable for discharge to home.   Per Dr Elberta Fortisamnitz, will add Metoprolol at discharge.    Physical Exam: Vitals:   10/12/16 1328 10/12/16 1420 10/12/16 1923 10/13/16 0400  BP: 123/86 130/81 102/70 108/60  Pulse: 75 86 71 73  Resp:  13 20 20   Temp:  98.2 F (36.8 Cooper) 98.2 F (36.8 Cooper) 97.9 F (36.6 Cooper)  TempSrc:  Oral Oral Oral  SpO2:  98% 97% 96%  Weight:      Height:        Labs:   Lab Results  Component Value Date   WBC 11.0 (H) 10/12/2016   HGB 13.2 10/12/2016   HCT 39.4 10/12/2016   MCV 98.3 10/12/2016   PLT 270 10/12/2016     Recent Labs Lab 10/12/16 0209  NA 138  K 3.6  CL 102  CO2 25  BUN 21*  CREATININE 0.86  CALCIUM 9.6  GLUCOSE 96    Discharge Medications:    Medication List    TAKE these medications   acetaminophen 500 MG tablet Commonly known as:  TYLENOL Take 1,000 mg by mouth every 6 (six) hours as needed for mild pain.   ALPRAZolam 0.5 MG tablet Commonly known as:  XANAX Take 0.25 mg by mouth 5 (five) times daily as needed for anxiety.   amphetamine-dextroamphetamine 20 MG tablet Commonly known as:  ADDERALL Take 20 mg by mouth 3 (three) times daily.   aspirin EC 81 MG tablet Take 81 mg by mouth daily.   atorvastatin 40 MG tablet Commonly known as:  LIPITOR Take  40 mg by mouth daily at 6 PM.   folic acid 1 MG tablet Commonly known as:  FOLVITE Take 1 tablet (1 mg total) by mouth daily.   losartan 100 MG tablet Commonly known as:  COZAAR Take 100 mg by mouth daily.   metoprolol succinate 25 MG 24 hr tablet Commonly known as:  TOPROL XL Take 1 tablet (25 mg total) by mouth daily.   multivitamin with minerals Tabs tablet Take 1 tablet by mouth daily.   REXULTI 1 MG Tabs Generic drug:  Brexpiprazole Take 1 mg by mouth daily.   sertraline 100 MG tablet Commonly known as:  ZOLOFT Take 150 mg by mouth daily.   thiamine 100 MG  tablet Take 1 tablet (100 mg total) by mouth daily.   traZODone 50 MG tablet Commonly known as:  DESYREL Take 100-150 mg by mouth at bedtime as needed for sleep. For sleep.       Disposition:  Discharge Instructions    Diet - low sodium heart healthy    Complete by:  As directed    Increase activity slowly    Complete by:  As directed      Follow-up Information    [facility] Follow up on 10/25/2016.   Specialty:  Cardiology Why:  at 2:30PM for wound check  Contact information: [address], [address] [place] [address] [phone]       Sherryl Manges, MD Follow up on 01/11/2017.   Specialty:  Cardiology Why:  at [facility]  Contact information: [address] [place] [address] [phone]           Duration of Discharge Encounter: Greater than 30 minutes including physician time.  Signed, Gypsy Balsam, NP 10/13/2016 7:32 AM  I have seen and examined this patient with Gypsy Balsam.  Agree with above, note added to reflect my findings.  On exam, regular rhythm, no murmurs, lungs clear.  Had pacemaker placed for dysautonomic syncope.  CXR and interrogation without issues.  Plan for discharge today with follow up in device clinic.  She has been having symptomatic APCs, will add metoprolol to her medications..    Will M. Camnitz MD 10/13/2016 8:42 AM

## 2016-10-12 NOTE — Consult Note (Signed)
CARDIOLOGY CONSULT NOTE     Primary Care Physician: [name], MD Referring Physician:  Admit Date: 10/11/2016  Reason for consultation:  Brittney Cooper is a 55 y.o. female with a h/o bipolar disorder, EtOH pancreatitis, HTN, PTSD, and neurocardiogenic syncope who presented to the hospital after episodes of palpitations. She also noted intermittent dizziness. She had no episodes of syncope. In the emergency room heart rate was found to be stable but she had PACs. EKGs from the EMS truck also show sinus rhythm with PACs. There are no exacerbating or alleviating factors. This morning, she feels well without complaints.  Today, she denies symptoms of palpitations, chest pain, shortness of breath, orthopnea, PND, lower extremity edema, dizziness, presyncope, syncope, or neurologic sequela. The patient is tolerating medications without difficulties and is otherwise without complaint today.   Past Medical History:  Diagnosis Date  . ADHD (attention deficit hyperactivity disorder)   . 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 Anxiety   . Arthritis   . Bipolar disorder (HCC)   . Chronic pain    back  . Depression   . Fatty liver   . Fracture of lateral malleolus of right fibula   . Hypertension   . OCD (obsessive compulsive disorder)   . Panic attack   . 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 Syncope    neurocardiogenic   Past Surgical History:  Procedure Laterality Date  . ABDOMINAL HYSTERECTOMY    . ANTERIOR AND POSTERIOR REPAIR    . BUNIONECTOMY Bilateral   . finfger surgery    . FINGER SURGERY    . LTCS    . MYRINGOTOMY    . ORIF ANKLE FRACTURE Right 07/25/2015   Procedure: OPEN REDUCTION INTERNAL FIXATION (ORIF) RIGHT ANKLE ;  Surgeon: Sheral Apley, MD;  Location: [facility];  Service: Orthopedics;  Laterality: Right;  . RHINOPLASTY    . TONSILLECTOMY      . aspirin  324  mg Oral NOW   Or  . aspirin  300 mg Rectal NOW  . aspirin EC  81 mg Oral Daily  . atorvastatin  40 mg Oral q1800  . Brexpiprazole  1 mg Oral Daily  .  ceFAZolin (ANCEF) IV  2 g Intravenous To SS-Surg  . folic acid  1 mg Oral Daily  . gentamicin irrigation  80 mg Irrigation On Call  . losartan  100 mg Oral Daily  . multivitamin with minerals  1 tablet Oral Daily  . sertraline  150 mg Oral Daily  . thiamine  100 mg Oral Daily   . sodium chloride 50 mL/hr at 10/12/16 0559    Allergies  Allergen Reactions  . Codeine Nausea And Vomiting  . Demerol [Meperidine] Other (See Comments)    Hallucinations     Social History   Social History  . Marital status: Married    Spouse name: N/A  . Number of children: N/A  . Years of education: N/A   Occupational History  . RN    Social History Main Topics  . Smoking status: Current Every Day Smoker    Packs/day: 1.00    Years: 15.00    Types: Cigarettes  . Smokeless tobacco: Never Used  . Alcohol use 0.0 oz/week  . Drug use: No  . Sexual activity: Not on file   Other Topics Concern  . Not on file   Social History Narrative  .  No narrative on file    Family History  Problem Relation Age of Onset  . Stroke Mother   . Kidney disease Mother   . Cancer Father     stage IV lung  . Cancer Maternal Grandfather     Stomach cancer  . Cancer Paternal Grandfather     Colon cancer    ROS- All systems are reviewed and negative except as per the HPI above  Physical Exam: Telemetry: Vitals:   10/11/16 2300 10/11/16 2345 10/12/16 0026 10/12/16 0535  BP: 131/90 131/97 (!) 93/49 (!) 155/88  Pulse: 85 76 83 85  Resp: (!) 28 17 18 18   Temp:   98 F (36.7 C) 97.6 F (36.4 C)  TempSrc:   Oral Oral  SpO2: 94% 93% 98% 99%  Weight:   186 lb 11.2 oz (84.7 kg)   Height:   5\' 4"  (1.626 m)     GEN- The patient is well appearing, alert and oriented x 3 today.   Head- normocephalic, atraumatic Eyes-  Sclera clear, conjunctiva  pink Ears- hearing intact Oropharynx- clear Neck- supple, no JVP Lymph- no cervical lymphadenopathy Lungs- Clear to ausculation bilaterally, normal work of breathing Heart- Regular rate and rhythm, no murmurs, rubs or gallops, PMI not laterally displaced GI- soft, NT, ND, + BS Extremities- no clubbing, cyanosis, or edema MS- no significant deformity or atrophy Skin- no rash or lesion Psych- euthymic mood, full affect Neuro- strength and sensation are intact  EKG: sinus rhythm with PACs, nonspecific T wave abnormalities  Labs:   Lab Results  Component Value Date   WBC 11.0 (H) 10/12/2016   HGB 13.2 10/12/2016   HCT 39.4 10/12/2016   MCV 98.3 10/12/2016   PLT 270 10/12/2016    Recent Labs Lab 10/12/16 0209  NA 138  K 3.6  CL 102  CO2 25  BUN 21*  CREATININE 0.86  CALCIUM 9.6  GLUCOSE 96   Lab Results  Component Value Date   TROPONINI <0.03 07/12/2016    Lab Results  Component Value Date   CHOL 125 09/16/2016   Lab Results  Component Value Date   HDL 60 09/16/2016   Lab Results  Component Value Date   LDLCALC 31 09/16/2016   Lab Results  Component Value Date   TRIG 171 (H) 09/16/2016   Lab Results  Component Value Date   CHOLHDL 2.1 09/16/2016   No results found for: LDLDIRECT    Radiology: The cardiac silhouette is top-normal in size and mediastinal contours are within normal limits. Both lungs are clear. There has been clearing of small right pleural effusion. The visualized skeletal structures are unremarkable.  ASSESSMENT AND PLAN:  1. Palpitations: Trigger her visits to the emergency room. EKG from the ambulance as well as telemetry show sinus rhythm with PACs. Currently, with her episodes of dysautonomia, Romell Wolden hold rate controlling medications until she has her pacemaker placed.  2. Disautonomic syncope: Plan for dual-chamber pacemaker, as discussed in clinic previously with Dr. Graciela HusbandsKlein. Risks and benefits of the procedure were discussed.  Risks include, but are not limited to, bleeding, infection, tamponade, and heart block. She understands these risks and has agreed to the procedure.   Jaiya Mooradian Jorja LoaMartin Dorr Perrot, MD 10/12/2016  7:28 AM

## 2016-10-12 NOTE — Discharge Instructions (Signed)
° ° °  Supplemental Discharge Instructions for  Pacemaker/Defibrillator Patients  Activity No heavy lifting or vigorous activity with your left/right arm for 6 to 8 weeks.  Do not raise your left/right arm above your head for one week.  Gradually raise your affected arm as drawn below.           __      10/16/16                    10/17/16                  10/18/16                10/19/16  NO DRIVING   WOUND CARE - Keep the wound area clean and dry.  Do not get this area wet for one week. No showers for one week; you may shower on  10/19/16   . - The tape/steri-strips on your wound will fall off; do not pull them off.  No bandage is needed on the site.  DO  NOT apply any creams, oils, or ointments to the wound area. - If you notice any drainage or discharge from the wound, any swelling or bruising at the site, or you develop a fever > 101? F after you are discharged home, call the office at once.  Special Instructions - You are still able to use cellular telephones; use the ear opposite the side where you have your pacemaker/defibrillator.  Avoid carrying your cellular phone near your device. - When traveling through airports, show security personnel your identification card to avoid being screened in the metal detectors.  Ask the security personnel to use the hand wand. - Avoid arc welding equipment, MRI testing (magnetic resonance imaging), TENS units (transcutaneous nerve stimulators).  Call the office for questions about other devices. - Avoid electrical appliances that are in poor condition or are not properly grounded. - Microwave ovens are safe to be near or to operate.

## 2016-10-13 ENCOUNTER — Inpatient Hospital Stay (HOSPITAL_COMMUNITY): Payer: 59

## 2016-10-13 DIAGNOSIS — I10 Essential (primary) hypertension: Secondary | ICD-10-CM | POA: Diagnosis not present

## 2016-10-13 DIAGNOSIS — R001 Bradycardia, unspecified: Secondary | ICD-10-CM | POA: Diagnosis not present

## 2016-10-13 DIAGNOSIS — I442 Atrioventricular block, complete: Secondary | ICD-10-CM | POA: Diagnosis not present

## 2016-10-13 DIAGNOSIS — F909 Attention-deficit hyperactivity disorder, unspecified type: Secondary | ICD-10-CM | POA: Diagnosis not present

## 2016-10-13 MED ORDER — METOPROLOL SUCCINATE ER 25 MG PO TB24: 25.0000 mg | ORAL_TABLET | Freq: Every day | ORAL | 1 refills | Status: DC

## 2016-10-13 NOTE — Progress Notes (Signed)
Pt given d/c instructions. Reinforced not using L arm. Pt not receptive to information given. States she's "independent and will do what she has to".  Tele monitor and IV removed. Pt waiting for ride. Call bell and phone within reach. Will continue to monitor.

## 2016-10-14 ENCOUNTER — Telehealth: Payer: Self-pay | Admitting: Internal Medicine

## 2016-10-14 IMAGING — US US ABDOMEN COMPLETE
1 series · 14 of 25 positions shown · non-contrast
Comparison: None.

CLINICAL DATA: Initial encounter for 12 hour history of right upper
quadrant pain.

EXAM:
ULTRASOUND ABDOMEN COMPLETE

[Series 1: us abdomen complete · 0.28mm/px · 14 of 73 slices shown]
[im 1/73]
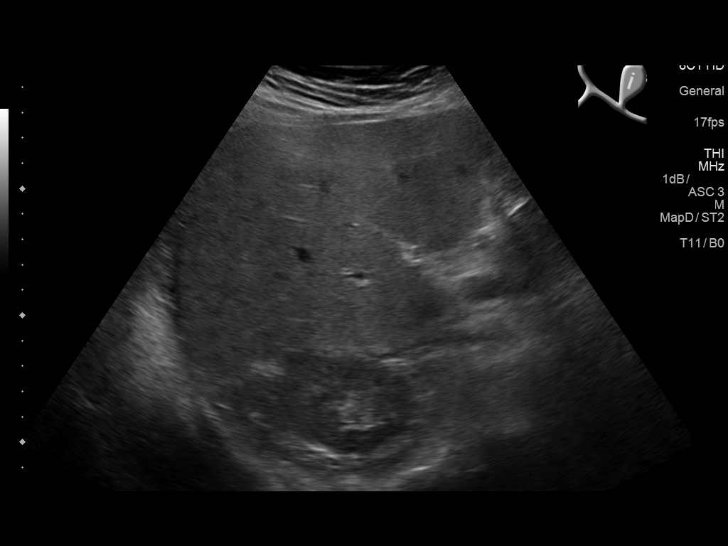
[im 7/73]
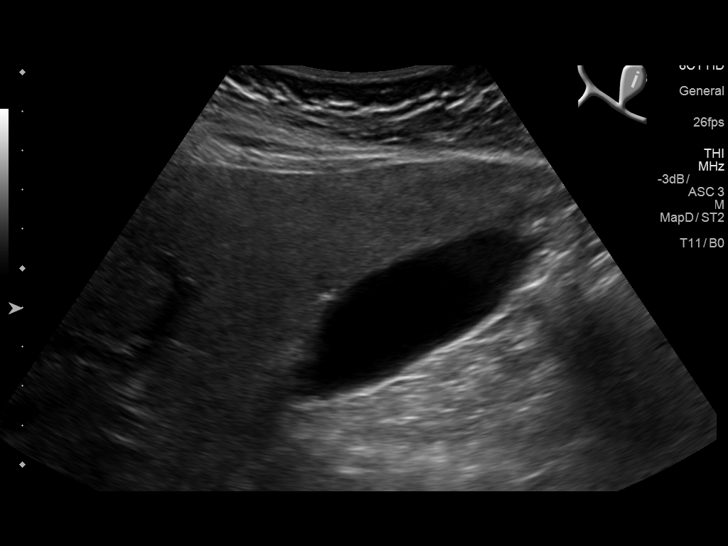
[im 13/73]
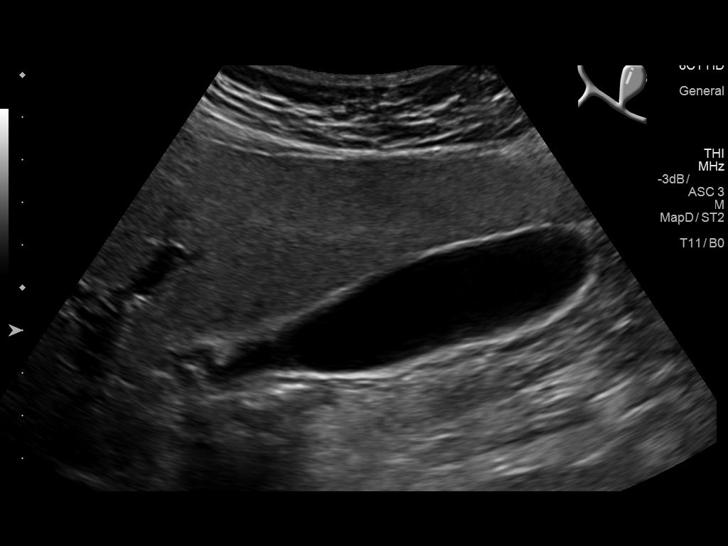
[im 19/73]
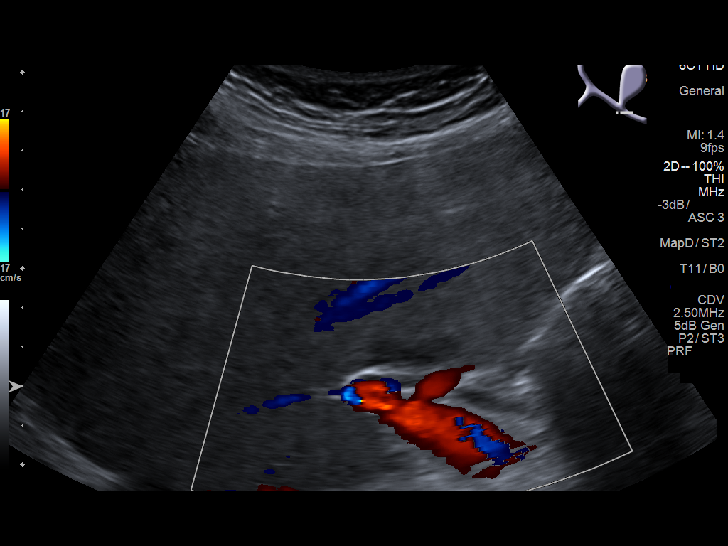
[im 25/73]
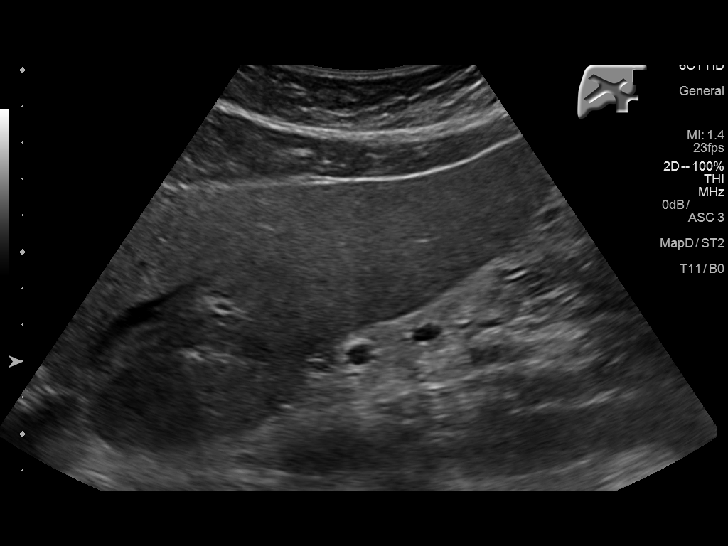
[im 28/73]
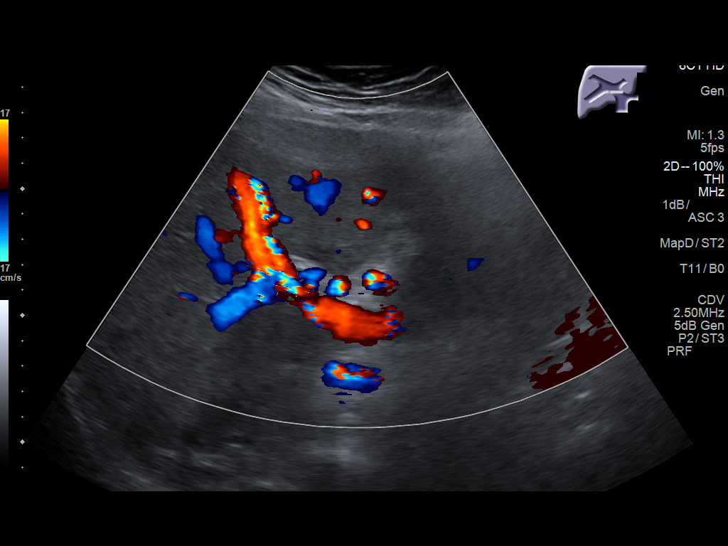
[im 34/73]
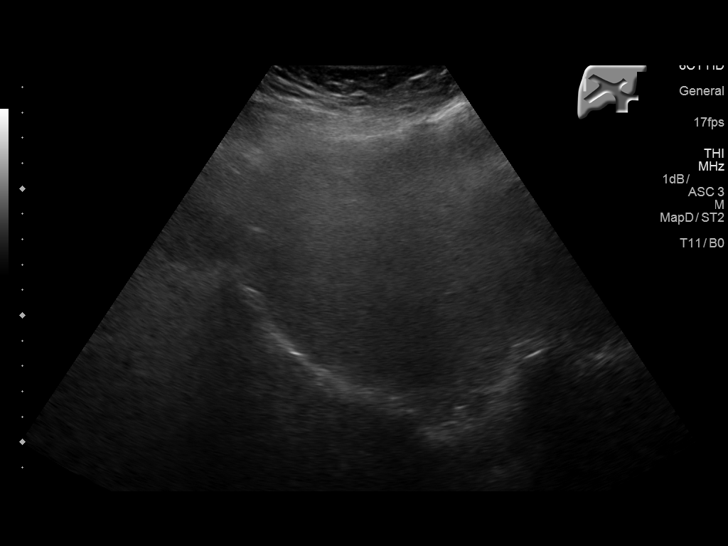
[im 40/73]
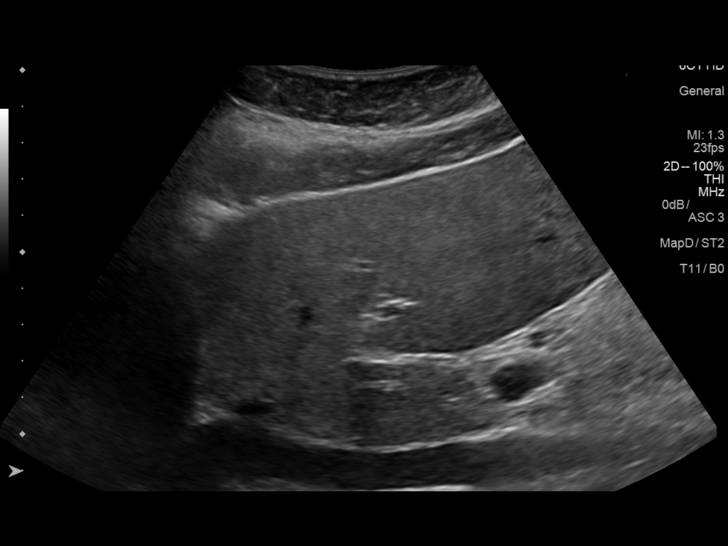
[im 46/73]
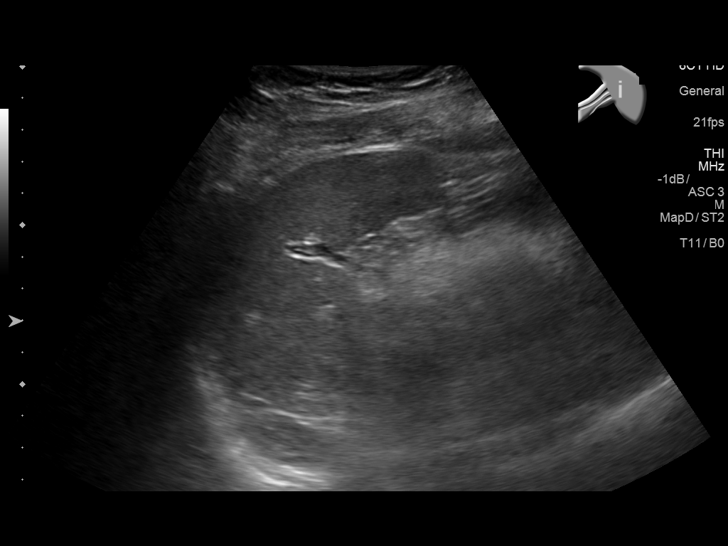
[im 49/73]
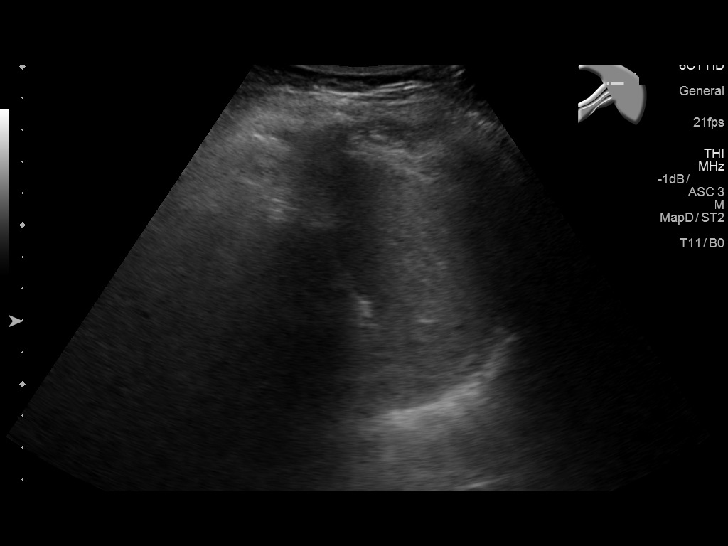
[im 55/73]
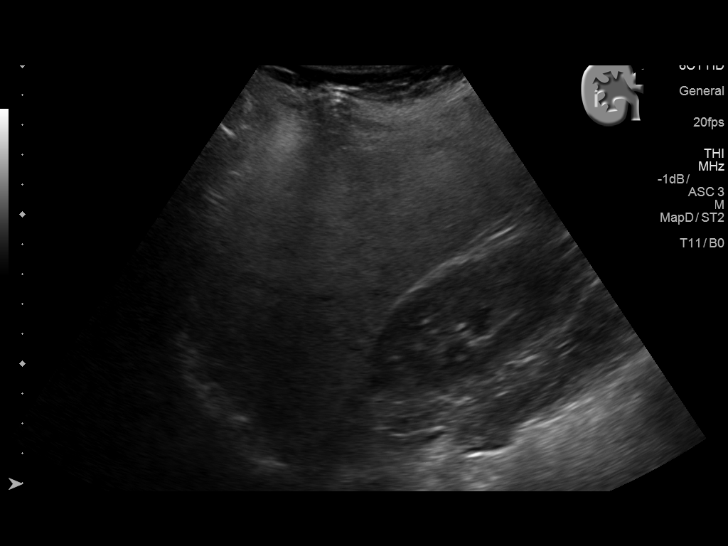
[im 61/73]
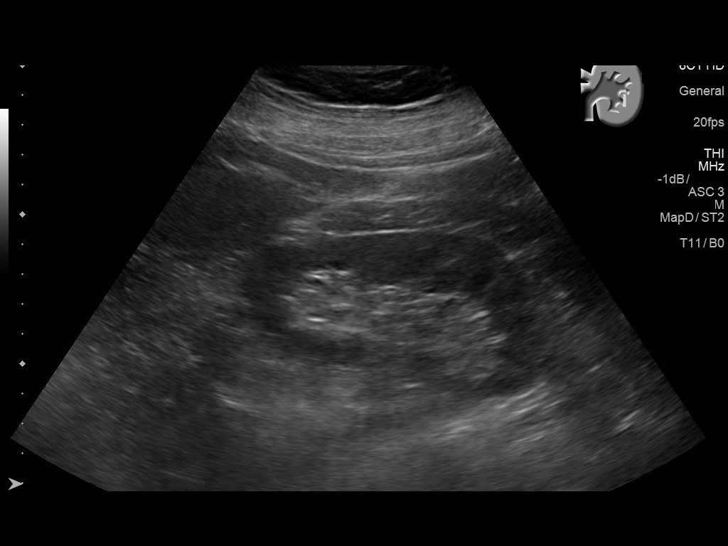
[im 67/73]
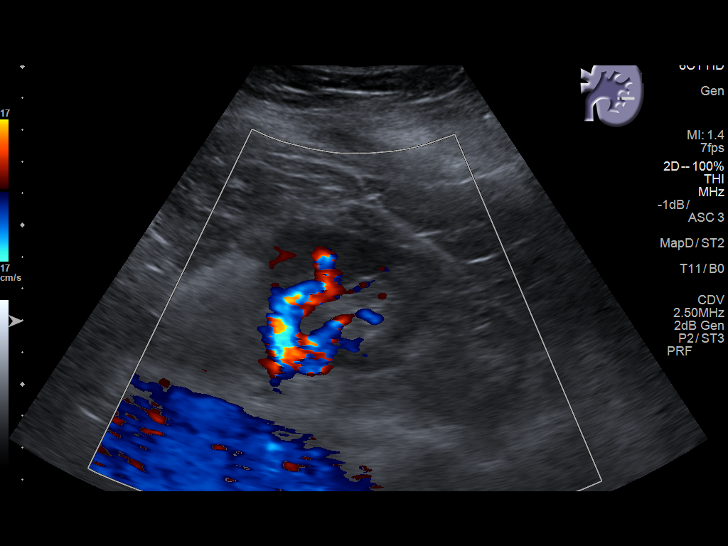
[im 73/73]
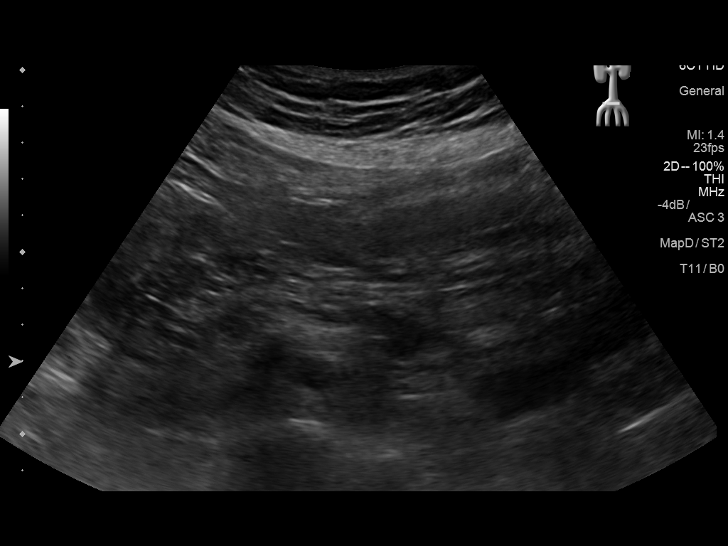

[14 of 25 positions shown; findings below may reference images not displayed]

FINDINGS: Gallbladder: No gallstones or wall thickening visualized. No
sonographic Murphy sign noted.

Common bile duct: Diameter: 3 mm

Liver: Coarsening of the echotexture is compatible with fatty
deposition. No focal intraparenchymal abnormality.

IVC: No abnormality visualized.

Pancreas: Head and body have normal sonographic features. Tail
obscured by overlying bowel gas.

Spleen: Size and appearance within normal limits.

Right Kidney: Length: 10.7 cm. Echogenicity within normal limits. No
mass or hydronephrosis visualized.

Left Kidney: Length: 10.6 cm. Echogenicity within normal limits. No
mass or hydronephrosis visualized.

Abdominal aorta: No aneurysm visualized.

Other findings: None.
IMPRESSION: Unremarkable abdominal ultrasound. Specifically, no features to
explain the patient's history of right upper quadrant pain.

## 2016-10-14 NOTE — Telephone Encounter (Signed)
Reviewed with Dr. Graciela HusbandsKlein- Rip Harbourk to give the patient Percocet 2.5/325 take one tablet by mouth every 4 hours as needed for severe pain #8 w/ no refills.  As Dr. Graciela HusbandsKlein is in [place] this morning and leaving for a flight this afternoon, he has discussed with Tereso NewcomerScott Weaver, PA-C who is agreeable to writing the script in Dr. Odessa FlemingKlein's abscense for the patient to pick up in [place].  I have left a message for the patient to call.

## 2016-10-14 NOTE — Telephone Encounter (Signed)
New Message:   Pt had a pacemaker put in on Tuesday(10-12-16). The Tylenol is not working,was told to call back if it didn't work, She said they said they would give her some Percocet.

## 2016-10-14 NOTE — Telephone Encounter (Signed)
I called the patient and spoke with her. She is aware her RX for percocet is at the front desk of our [place] office for pickup.

## 2016-10-14 NOTE — Telephone Encounter (Signed)
Rx left at front desk for pickup. Tereso NewcomerScott Camillo Quadros, PA-C   10/14/2016 1:24 PM

## 2016-10-18 ENCOUNTER — Ambulatory Visit (INDEPENDENT_AMBULATORY_CARE_PROVIDER_SITE_OTHER): Payer: 59 | Admitting: Nurse Practitioner

## 2016-10-18 ENCOUNTER — Encounter: Payer: Self-pay | Admitting: Nurse Practitioner

## 2016-10-18 VITALS — BP 118/72 | HR 80 | Ht 64.0 in | Wt 185.5 lb

## 2016-10-18 DIAGNOSIS — F909 Attention-deficit hyperactivity disorder, unspecified type: Secondary | ICD-10-CM | POA: Diagnosis not present

## 2016-10-18 DIAGNOSIS — Z1211 Encounter for screening for malignant neoplasm of colon: Secondary | ICD-10-CM

## 2016-10-18 DIAGNOSIS — K858 Other acute pancreatitis without necrosis or infection: Secondary | ICD-10-CM | POA: Diagnosis not present

## 2016-10-18 MED ORDER — NA SULFATE-K SULFATE-MG SULF 17.5-3.13-1.6 GM/177ML PO SOLN: 1.0000 | Freq: Once | ORAL | 0 refills | Status: AC

## 2016-10-18 NOTE — Patient Instructions (Signed)
If you are age 53 or older, your body mass index should be between 23-30. Your Body mass index is 31.84 kg/m. If this is out of the aforementioned range listed, please consider follow up with your Primary Care Provider.  If you are age 53 or younger, your body mass index should be between 19-25. Your Body mass index is 31.84 kg/m. If this is out of the aformentioned range listed, please consider follow up with your Primary Care Provider.   You have been scheduled for a colonoscopy. Please follow written instructions given to you at your visit today.  Please pick up your prep supplies at the pharmacy within the next 1-3 days. If you use inhalers (even only as needed), please bring them with you on the day of your procedure. Your physician has requested that you go to www.startemmi.com and enter the access code given to you at your visit today. This web site gives a general overview about your procedure. However, you should still follow specific instructions given to you by our office regarding your preparation for the procedure.  You have been given a separate informational sheet regarding your tobacco use, the importance of quitting and local resources to help you quit.  Please continue to not consume alcohol.  Thank you for choosing [facility]

## 2016-10-18 NOTE — Progress Notes (Signed)
HPI: Patient is a 55 year old female, here with her father, referred by PCP Nat Math, MD for evaluation of pancreatitis.Brittney Cooper She was hospitalized in July and again in September with ETOH pancreatitis. CT scan in July revealed acute pancreatitis and a small pseduocyst in tail measuring 1.2 cm. No gallstones or biliary duct dilation. In late September repeat inpatient CTscan evealed acute pancreatitis with small (6x6mm) pseudocyst versus necrosis. Again no biliary duct dilation. Transaminases only mildly elevated. Patient started drinking only 5- 6 years ago. She was consuming 4-5 shots of 100 proof daily.   Patient also has a history of dysautonomic syncope followed by Dr. Graciela Husbands.  On event monitor she was having long pauses. Patient present to ED 10/11/16 with symptomatic complete heart block and underwent dual-chamber pacemaker1.5 weeks ago.   No alcohol since her pacemaker was placed. She has no abdominal pain or nausea, feels great actually. No weight loss, she gained 60 pounds over last 5 years working from home and being unable to exercise because of cardiac problems    Patient is trying to stop smoking. He has a history of depression, stable on treatment.    Past Medical History:  Diagnosis Date  . ADHD (attention deficit hyperactivity disorder)   . 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 Anxiety   . Arthritis   . Bipolar disorder (HCC)   . Chronic pain    back  . Depression   . Fatty liver   . Fracture of lateral malleolus of right fibula   . Hepatomegaly   . Hypertension   . OCD (obsessive compulsive disorder)   . Pancreatic pseudocyst   . Panic attack   . 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 Syncope    neurocardiogenic    Past Surgical History:  Procedure Laterality Date  . ABDOMINAL HYSTERECTOMY    . ANTERIOR AND POSTERIOR REPAIR    . BUNIONECTOMY Bilateral   . EP  IMPLANTABLE DEVICE N/A 10/12/2016   Procedure: Pacemaker Implant;  Surgeon: Will Jorja Loa, MD;  Location: [facility];  Service: Cardiovascular;  Laterality: N/A;  . finfger surgery    . FINGER SURGERY    . LTCS    . MYRINGOTOMY    . ORIF ANKLE FRACTURE Right 07/25/2015   Procedure: OPEN REDUCTION INTERNAL FIXATION (ORIF) RIGHT ANKLE ;  Surgeon: Sheral Apley, MD;  Location: [facility];  Service: Orthopedics;  Laterality: Right;  . RHINOPLASTY    . TONSILLECTOMY     Family History  Problem Relation Age of Onset  . Stroke Mother   . Kidney disease Mother   . Lung cancer Father     stage IV   . Stomach cancer Maternal Grandfather   . Colon cancer Paternal Grandfather    Social History  Substance Use Topics  . Smoking status: Current Every Day Smoker    Packs/day: 1.00    Years: 15.00    Types: Cigarettes  . Smokeless tobacco: Never Used  . Alcohol use No     Comment: quit    Current Outpatient Prescriptions  Medication Sig Dispense Refill  . acetaminophen (TYLENOL) 500 MG tablet Take 1,000 mg by mouth every 6 (six) hours as needed for mild pain.    Brittney Cooper ALPRAZolam (XANAX) 0.5 MG tablet Take 0.25 mg by mouth 5 (five) times daily as needed for anxiety.     Brittney Cooper amphetamine-dextroamphetamine (  ADDERALL) 20 MG tablet Take 20 mg by mouth 3 (three) times daily.    Brittney Cooper. aspirin EC 81 MG tablet Take 81 mg by mouth daily.    Brittney Cooper. atorvastatin (LIPITOR) 40 MG tablet Take 40 mg by mouth daily at 6 PM.     . Brexpiprazole (REXULTI) 1 MG TABS Take 1 mg by mouth daily.    . folic acid (FOLVITE) 1 MG tablet Take 1 tablet (1 mg total) by mouth daily. 30 tablet 0  . losartan (COZAAR) 100 MG tablet Take 100 mg by mouth daily.     . metoprolol succinate (TOPROL XL) 25 MG 24 hr tablet Take 1 tablet (25 mg total) by mouth daily. 30 tablet 1  . Multiple Vitamin (MULTIVITAMIN WITH MINERALS) TABS tablet Take 1 tablet by mouth daily. 30 tablet 0  . sertraline (ZOLOFT) 100 MG tablet  Take 150 mg by mouth daily.     Brittney Cooper. thiamine 100 MG tablet Take 1 tablet (100 mg total) by mouth daily. 30 tablet 0  . traZODone (DESYREL) 50 MG tablet Take 100-150 mg by mouth at bedtime as needed for sleep. For sleep.     No current facility-administered medications for this visit.    Allergies  Allergen Reactions  . Codeine Nausea And Vomiting  . Demerol [Meperidine] Other (See Comments)    Hallucinations      Review of Systems: Positive for allergy, sinus trouble, anxiety, panic, depression, fatigue, heart rhythm changes, night sweats, shortness of breath and sleeping problems All other systems reviewed and negative except where noted in HPI.    Physical Exam: BP 118/72   Pulse 80   Ht 5\' 4"  (1.626 m)   Wt 185 lb 8 oz (84.1 kg)   BMI 31.84 kg/m  Constitutional: Pleasant,well-developed, white female in no acute distress. HEENT: Normocephalic and atraumatic. Conjunctivae are normal. No scleral icterus. Neck supple.  Cardiovascular: Normal rate, regular rhythm.  Pulmonary/chest: Effort normal and breath sounds normal. No wheezing, rales or rhonchi. Abdominal: Soft, nondistended, nontender. Bowel sounds active throughout. There are no masses palpable. No hepatomegaly. Extremities: no edema Lymphadenopathy: No cervical adenopathy noted. Neurological: Alert and oriented to person place and time. Skin: Skin is warm and dry. No rashes noted. Psychiatric: Normal mood and affect. Behavior is normal.   ASSESSMENT AND PLAN:  Acute pancreatitis, resolving. Hospitalized in July and Sept for this. No evidence for biliary etiology, ETOH most likely cause.  CT scan in July  ---> acute pancreatitis and small pseudocyst in pancreatic tail. Repeat inpatient  CTscan late September -----> acute pancreatitis with new small hypodensity in uncinate process,  pseudocyst versus necrosis. No alcohol in nearly 2 weeks. Patient feels okay, no abdominal pain. She is eating. -Patient knows that alcohol  avoidance is paramount. She is removing alcohol from her home.  -Probably needs follow-up CT scan for reevaluation in a couple of months. Her Gastroenterologist with be Dr. Lavon PaganiniNandigam who can review previous CTscan and decide on timing of next one.   Colon cancer screening. Never had a colonoscopy. Paternal grandfather had cecal cancer. Patient will be scheduled for a screening colonoscopy with possible polypectomy. The risks and benefits of the procedure were discussed and the patient agrees to proceed. Patient requesting use of a pediatric scope. 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 Normal LFTs. We discussed management of steatosis:.  -Treatment of hyperlipidemia. Patient is on a statin -  Weight loss. Goal would be 1-2 pounds / week.  -Avoid all alcohol.   Tobacco abuse, patient is weaning herself from cigarettes  Depression, stable on treatment  Attention deficit disorder, on Adderall.  Cc:  Cheron Schaumann

## 2016-10-19 ENCOUNTER — Other Ambulatory Visit: Payer: Self-pay

## 2016-10-19 NOTE — Progress Notes (Signed)
Reviewed and agree with documentation and assessment and plan. Will need follow up MRCP in 6-8 weeks to follow up pancreatic cysts and ?necorisis seen on prior CT abd & pelvis  K. Scherry RanVeena Nandigam , MD

## 2016-10-21 ENCOUNTER — Telehealth: Payer: Self-pay

## 2016-10-21 ENCOUNTER — Telehealth: Payer: Self-pay | Admitting: Internal Medicine

## 2016-10-21 ENCOUNTER — Other Ambulatory Visit: Payer: Self-pay

## 2016-10-21 NOTE — Telephone Encounter (Signed)
-----   Message from Meredith PelPaula M Guenther, NP sent at 10/21/2016  3:30 PM EDT ----- Waynetta SandyBeth, which please schedule patient for an MRCP to be done in about 6 weeks to follow-up on CT scan findings of pancreatitis and hypodensity in the uncinate process of pancreas. She will be [name] patient and she has reviewed patient's CTscan and wants this follow up MRCP. Thanks

## 2016-10-21 NOTE — Telephone Encounter (Signed)
New message  Pt call requesting to speak with Rn about her device. Pt states she has a couple of questions for RN. Please call back to discuss

## 2016-10-21 NOTE — Telephone Encounter (Signed)
Spoke to patient about dizziness that she's noticed x "few days". Patient states that overall she has felt a lot better since her ppm was implanted, but she says that she has had a few dizzy spells over the past few days. She said that the longest episode she's had lasted about 30 seconds. She said that her BP has been stable with a systolic pressure running btwn 110-120bpm. She said that her HR has been in the 90s. Pacemaker transmission reviewed and lead measurements are stable. No episodes recorded.   I reviewed Dr.Klein's note from 2 weeks ago, and saw where he had spoken to her about ways to prevent these spells. I reiterated to her the importance of consuming salt and staying hydrated with this condition. I also told her that as soon as she notices that she's having a spell she needs to lie down to reduce likelihood of injury.   Patient voiced understanding and appreciation of information.   Patient also wanted to know if Dr.Klein would order an abdominal binder for her. She said that they had discussed this previously and now she feels that it may be beneficial.  Will forward information to Dr.Klein and Johnsie KindredHeather McGhee,RN. Patient is aware that Dr.Klein won't be back in the office again until next week.

## 2016-10-22 NOTE — Telephone Encounter (Signed)
I left a message for the patient to call regarding her abdominal binder.

## 2016-10-22 NOTE — Telephone Encounter (Signed)
I spoke with the patient. She is aware that she does not need an order for an abdominal binder. [facility] may [facility] at [facility] or the pharmacy. She states that she has been dizzy some since her PPM was placed. I have advised her that she may still have some dizziness that is not HR related. I have encouraged salt/ hydration. She is also aware if she feels pre-syncopal or has syncope, if some one around her could check her BP as quick as possible, that would be helpful for us. She voices understanding.

## 2016-10-22 NOTE — Telephone Encounter (Signed)
F/U Patient is returning your call from earlier this morning.

## 2016-10-25 ENCOUNTER — Ambulatory Visit (INDEPENDENT_AMBULATORY_CARE_PROVIDER_SITE_OTHER): Payer: 59 | Admitting: *Deleted

## 2016-10-25 DIAGNOSIS — I442 Atrioventricular block, complete: Secondary | ICD-10-CM | POA: Diagnosis not present

## 2016-10-25 LAB — CUP PACEART INCLINIC DEVICE CHECK
Date Time Interrogation Session: 20171030174505
Implantable Lead Implant Date: 20171017
Implantable Lead Implant Date: 20171017
Implantable Lead Location: 753860
Implantable Lead Model: 377
Implantable Lead Serial Number: 49593512
Implantable Pulse Generator Implant Date: 20171017
Lead Channel Impedance Value: 585 Ohm
Lead Channel Pacing Threshold Amplitude: 0.6 V
Lead Channel Pacing Threshold Pulse Width: 0.4 ms
Lead Channel Sensing Intrinsic Amplitude: 12.1 mV
MDC IDC LEAD LOCATION: 753859
MDC IDC LEAD SERIAL: 49620910
MDC IDC MSMT LEADCHNL RA IMPEDANCE VALUE: 468 Ohm
MDC IDC MSMT LEADCHNL RA SENSING INTR AMPL: 2.6 mV
MDC IDC MSMT LEADCHNL RV PACING THRESHOLD AMPLITUDE: 0.8 V
MDC IDC MSMT LEADCHNL RV PACING THRESHOLD PULSEWIDTH: 0.4 ms
MDC IDC PG SERIAL: 68784192
MDC IDC SET LEADCHNL RA PACING AMPLITUDE: 3 V
MDC IDC SET LEADCHNL RV PACING AMPLITUDE: 3 V
MDC IDC SET LEADCHNL RV PACING PULSEWIDTH: 0.4 ms
Pulse Gen Model: 394969

## 2016-10-25 NOTE — Progress Notes (Signed)
Wound check appointment. Steri-strips removed by patient at home. Wound without redness or edema. Incision edges approximated, wound well healed. Normal device function. Thresholds, sensing, and impedances consistent with implant measurements. Device programmed at 3.5V for extra safety margin until 3 month visit. Histogram distribution appropriate for patient and level of activity. No mode switches. 2 high ventricular rates noted, EGMs show atrial tach. Pt reports increased dizziness, CLS response adjusted to High with rate control +40 and program count increased to 10 per SK. Patient educated about wound care, arm mobility, lifting restrictions. ROV with SK 1/16.

## 2016-10-27 ENCOUNTER — Encounter: Payer: Self-pay | Admitting: Gastroenterology

## 2016-11-02 ENCOUNTER — Telehealth: Payer: Self-pay | Admitting: Cardiology

## 2016-11-02 NOTE — Telephone Encounter (Signed)
Pt called today and stated that she is suppose to go back to work tomorrow but she is still dizzy. She is not sure what she needs to do. Please call and discuss.

## 2016-11-02 NOTE — Telephone Encounter (Signed)
Pt called reporting that she was still experiencing dizziness. She states that the changes made on 10/30 have improved the frequency but not resolved the issue. She is supposed to return to work tomorrow but is concerned about returning at this time. I will write her a work note and leave it at the front desk for pick up. I scheduled her for a DC appt 11/8 with SK in the office. Industry has been notified and will be present for appt. Industry recommends considering additional CLS adjustment vs rate control adjustment. Pt instructed not to drive at this time. Pt verbalizes understanding.

## 2016-11-03 ENCOUNTER — Ambulatory Visit (INDEPENDENT_AMBULATORY_CARE_PROVIDER_SITE_OTHER): Payer: 59 | Admitting: *Deleted

## 2016-11-03 DIAGNOSIS — Z950 Presence of cardiac pacemaker: Secondary | ICD-10-CM

## 2016-11-03 NOTE — Progress Notes (Signed)
Pt seen for c/o of dizziness, device interrogated by industry. Pt stated that dizziness had improved since last OV. Pt seen by SK recommended abdominal binder and increase fluid intake, no changes made to device. ROV with AS 01/11/2017.

## 2016-11-05 ENCOUNTER — Encounter: Payer: 59 | Admitting: Gastroenterology

## 2016-11-10 ENCOUNTER — Telehealth: Payer: Self-pay | Admitting: Internal Medicine

## 2016-11-10 NOTE — Telephone Encounter (Signed)
Patient called in stating she needs a RTW note please call her back at [phone].

## 2016-11-10 NOTE — Telephone Encounter (Signed)
She will need to scheduled for EUS as MRCP cannot be performed. Patty can you help with scheduling the patient for EUS?

## 2016-11-10 NOTE — Telephone Encounter (Signed)
Dr Jacobs please advise  

## 2016-11-10 NOTE — Telephone Encounter (Signed)
Patient has a pacemaker. [facility] did not recognize her brand and does not want to do an MRCP.

## 2016-11-11 ENCOUNTER — Other Ambulatory Visit: Payer: Self-pay

## 2016-11-11 DIAGNOSIS — K8500 Idiopathic acute pancreatitis without necrosis or infection: Secondary | ICD-10-CM

## 2016-11-11 DIAGNOSIS — R1030 Lower abdominal pain, unspecified: Secondary | ICD-10-CM

## 2016-11-11 DIAGNOSIS — K862 Cyst of pancreas: Secondary | ICD-10-CM

## 2016-11-11 NOTE — Telephone Encounter (Signed)
Brittney Cooper. [name], will you please let patient know. Thanks

## 2016-11-11 NOTE — Telephone Encounter (Signed)
I spoke with the pt and she is waiting on [name] to set up CT for December.

## 2016-11-11 NOTE — Progress Notes (Signed)
Scheduled for 12/10/16 at 9:15 am

## 2016-11-11 NOTE — Telephone Encounter (Signed)
Waxing, waning cysts in pancreas (tail cyst present in July was gone in Sept).  Both occurring around pretty well established acute Etoh related pancreatitis episodes.  Pretest likelihood is that the newest cyst (uncinate) is also a pseudocyst.  The tail cyst must have been pseudocyst since it resolved by time of Sept CT.  I would give her another 1-2 months before repeat imaging and then at that time CT should be adequate to evaluate.  Hopefully she will continue to abstain from Etoh.    Tyrone NineVeena, Let me know what the interval CT scan shows (December or January).  Thanks

## 2016-11-11 NOTE — Telephone Encounter (Signed)
Thank you Brittney Cooper for reviewing the case. [name], can you please set patient for repeat CT in next 4-6 weeks. Thanks

## 2016-11-12 NOTE — Telephone Encounter (Signed)
Patient aware and scheduled for her CT 12/10/16 and arrive fasting at 8:45 am to [facility]. Pancreas protocol.

## 2016-12-05 ENCOUNTER — Emergency Department (HOSPITAL_BASED_OUTPATIENT_CLINIC_OR_DEPARTMENT_OTHER): Payer: 59

## 2016-12-05 ENCOUNTER — Encounter (HOSPITAL_BASED_OUTPATIENT_CLINIC_OR_DEPARTMENT_OTHER): Payer: Self-pay | Admitting: Emergency Medicine

## 2016-12-05 ENCOUNTER — Telehealth: Payer: Self-pay | Admitting: Physician Assistant

## 2016-12-05 ENCOUNTER — Other Ambulatory Visit: Payer: Self-pay

## 2016-12-05 ENCOUNTER — Emergency Department (HOSPITAL_BASED_OUTPATIENT_CLINIC_OR_DEPARTMENT_OTHER)
Admission: EM | Admit: 2016-12-05 | Discharge: 2016-12-05 | Disposition: A | Discharge status: Home / Self Care | Payer: 59 | Attending: Emergency Medicine | Admitting: Emergency Medicine

## 2016-12-05 DIAGNOSIS — H538 Other visual disturbances: Secondary | ICD-10-CM | POA: Diagnosis not present

## 2016-12-05 DIAGNOSIS — I10 Essential (primary) hypertension: Secondary | ICD-10-CM | POA: Insufficient documentation

## 2016-12-05 DIAGNOSIS — R42 Dizziness and giddiness: Secondary | ICD-10-CM | POA: Diagnosis not present

## 2016-12-05 DIAGNOSIS — Z79899 Other long term (current) drug therapy: Secondary | ICD-10-CM | POA: Insufficient documentation

## 2016-12-05 DIAGNOSIS — Z7982 Long term (current) use of aspirin: Secondary | ICD-10-CM | POA: Insufficient documentation

## 2016-12-05 DIAGNOSIS — R110 Nausea: Secondary | ICD-10-CM | POA: Insufficient documentation

## 2016-12-05 DIAGNOSIS — Z950 Presence of cardiac pacemaker: Secondary | ICD-10-CM | POA: Diagnosis not present

## 2016-12-05 DIAGNOSIS — R002 Palpitations: Secondary | ICD-10-CM | POA: Insufficient documentation

## 2016-12-05 DIAGNOSIS — F17210 Nicotine dependence, cigarettes, uncomplicated: Secondary | ICD-10-CM | POA: Diagnosis not present

## 2016-12-05 DIAGNOSIS — F909 Attention-deficit hyperactivity disorder, unspecified type: Secondary | ICD-10-CM | POA: Insufficient documentation

## 2016-12-05 LAB — BASIC METABOLIC PANEL
Anion gap: 11 (ref 5–15)
BUN: 15 mg/dL (ref 6–20)
CALCIUM: 9.6 mg/dL (ref 8.9–10.3)
CHLORIDE: 100 mmol/L — AB (ref 101–111)
CO2: 26 mmol/L (ref 22–32)
CREATININE: 0.75 mg/dL (ref 0.44–1.00)
GFR calc non Af Amer: >60 mL/min (ref >60)
GLUCOSE: 111 mg/dL — AB (ref 65–99)
Potassium: 3.6 mmol/L (ref 3.5–5.1)
Sodium: 137 mmol/L (ref 135–145)

## 2016-12-05 LAB — CBC WITH DIFFERENTIAL/PLATELET
BASOS PCT: 0 %
Basophils Absolute: 0 10*3/uL (ref 0.0–0.1)
Eosinophils Absolute: 0.1 10*3/uL (ref 0.0–0.7)
Eosinophils Relative: 1 %
HEMATOCRIT: 42.5 % (ref 36.0–46.0)
Hemoglobin: 14.8 g/dL (ref 12.0–15.0)
LYMPHS ABS: 1.9 10*3/uL (ref 0.7–4.0)
Lymphocytes Relative: 23 %
MCH: 33.7 pg (ref 26.0–34.0)
MCHC: 34.8 g/dL (ref 30.0–36.0)
MCV: 96.8 fL (ref 78.0–100.0)
MONO ABS: 0.7 10*3/uL (ref 0.1–1.0)
MONOS PCT: 8 %
NEUTROS ABS: 5.8 10*3/uL (ref 1.7–7.7)
Neutrophils Relative %: 68 %
Platelets: 267 10*3/uL (ref 150–400)
RBC: 4.39 MIL/uL (ref 3.87–5.11)
RDW: 13.2 % (ref 11.5–15.5)
WBC: 8.5 10*3/uL (ref 4.0–10.5)

## 2016-12-05 LAB — ETHANOL: Alcohol, Ethyl (B): <5 mg/dL (ref <5)

## 2016-12-05 MED ORDER — SODIUM CHLORIDE 0.9 % IV BOLUS (SEPSIS)
1000.0000 mL | Freq: Once | INTRAVENOUS | Status: AC
  Administered 2016-12-05: 1000 mL via INTRAVENOUS

## 2016-12-05 NOTE — Telephone Encounter (Signed)
Patient has been having problems with elevated blood pressure and blurred vision for several days. He has 180/124. Diastolic pressure of 119 also seen. [facility]. [facility]  I advised her this is appropriate as her symptoms are concerning.  Leanna BattlesBarrett, Rhonda, PA-C 12/05/2016 3:39 PM Beeper [phone]

## 2016-12-05 NOTE — ED Notes (Signed)
All triage notes previously entered taken/written by Fleet Contrasachel, RN.  Entered as CW in error.

## 2016-12-05 NOTE — Discharge Instructions (Signed)
Follow-up with your primary care doctor. Consider seeing an eye doctor for further evaluation

## 2016-12-05 NOTE — ED Provider Notes (Signed)
[facility] Provider Note   CSN: [id number] Arrival date & time: 12/05/16  1628 By signing my name below, I, Brittney Cooper, attest that this documentation has been prepared under the direction and in the presence of Linwood Dibbles, MD. Electronically Signed: Bridgette Cooper, ED Scribe. 12/05/16. 6:01 PM.  History   Chief Complaint Chief Complaint  Patient presents with  . Blurred Vision  . Irregular Heart Beat   HPI Comments: Brittney Cooper is a 55 y.o. female with h/o ADHD, bipolar disorder, OCD, PTSD, and HTN who presents to the Emergency Department complaining of elevated blood pressure onset 5 days ago with associated dizziness, nausea, blurred vision, epistaxis, and sensation of heart palpations. She states her highest today was 184/119. She has not taken any OTC medications PTA. Pt had a pacemaker implanted on 10/17 for a complete heart block she had. Pt is a smoker but she states she plans on quitting. No new medications. Pt denies fever, focal numbness/weakness, vomiting, diarrhea, fever, or any other associated symptoms.   The history is provided by the patient. No language interpreter was used.    Past Medical History:  Diagnosis Date  . ADHD (attention deficit hyperactivity disorder)   . 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 Anxiety   . Arthritis   . Bipolar disorder (HCC)   . Chronic pain    back  . Depression   . Fatty liver   . Fracture of lateral malleolus of right fibula   . Hepatomegaly   . Hypertension   . OCD (obsessive compulsive disorder)   . Pancreatic pseudocyst   . Panic attack   . 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 Syncope    neurocardiogenic    Patient Active Problem List   Diagnosis Date Noted  . Complete heart block (HCC)   . Palpitation 10/11/2016  . Palpitations 10/11/2016  . Acute pancreatitis 09/15/2016  . Alcohol abuse 09/15/2016  . ADHD (attention deficit  hyperactivity disorder) 09/15/2016  . Tobacco use 09/15/2016  . Anxiety 09/15/2016  . Hypokalemia 09/15/2016  . Pancreatitis 07/09/2016  . Alcoholic pancreatitis 07/08/2016  . Alcohol withdrawal (HCC) 07/08/2016  . Bipolar disorder (HCC) 07/08/2016  . Essential hypertension 07/08/2016  . Left shoulder pain 09/09/2014    Past Surgical History:  Procedure Laterality Date  . ABDOMINAL HYSTERECTOMY    . ANTERIOR AND POSTERIOR REPAIR    . BUNIONECTOMY Bilateral   . EP IMPLANTABLE DEVICE N/A 10/12/2016   Procedure: Pacemaker Implant;  Surgeon: Will Jorja Loa, MD;  [facility];  Service: Cardiovascular;  Laterality: N/A;  . finfger surgery    . FINGER SURGERY    . LTCS    . MYRINGOTOMY    . ORIF ANKLE FRACTURE Right 07/25/2015   Procedure: OPEN REDUCTION INTERNAL FIXATION (ORIF) RIGHT ANKLE ;  Surgeon: Sheral Apley, MD;  Location: [facility];  Service: Orthopedics;  Laterality: Right;  . PACEMAKER PLACEMENT    . RHINOPLASTY    . TONSILLECTOMY      OB History    No data available       Home Medications    Prior to Admission medications   Medication Sig Start Date End Date Taking? Authorizing Provider  acetaminophen (TYLENOL) 500 MG tablet Take 1,000 mg by mouth every 6 (six) hours as needed for mild pain.   Yes Historical Provider, MD  ALPRAZolam Prudy Feeler) 0.5 MG  tablet Take 0.25 mg by mouth 5 (five) times daily as needed for anxiety.    Yes Historical Provider, MD  amphetamine-dextroamphetamine (ADDERALL) 20 MG tablet Take 20 mg by mouth 3 (three) times daily.   Yes Historical Provider, MD  aspirin EC 81 MG tablet Take 81 mg by mouth daily.   Yes Historical Provider, MD  atorvastatin (LIPITOR) 40 MG tablet Take 40 mg by mouth daily at 6 PM.    Yes Historical Provider, MD  Brexpiprazole (REXULTI) 1 MG TABS Take 1 mg by mouth daily.   Yes Historical Provider, MD  folic acid (FOLVITE) 1 MG tablet Take 1 tablet (1 mg total) by mouth daily.  09/19/16  Yes Mir Vergie LivingMohammed Ikramullah, MD  losartan (COZAAR) 100 MG tablet Take 100 mg by mouth daily.  06/12/14  Yes Historical Provider, MD  metoprolol succinate (TOPROL XL) 25 MG 24 hr tablet Take 1 tablet (25 mg total) by mouth daily. 10/13/16  Yes Amber Caryl BisK Seiler, NP  Multiple Vitamin (MULTIVITAMIN WITH MINERALS) TABS tablet Take 1 tablet by mouth daily. 09/19/16  Yes Mir Vergie LivingMohammed Ikramullah, MD  sertraline (ZOLOFT) 100 MG tablet Take 150 mg by mouth daily.    Yes Historical Provider, MD  thiamine 100 MG tablet Take 1 tablet (100 mg total) by mouth daily. 09/19/16  Yes Mir Vergie LivingMohammed Ikramullah, MD  traZODone (DESYREL) 50 MG tablet Take 100-150 mg by mouth at bedtime as needed for sleep. For sleep. 06/30/16  Yes Historical Provider, MD    Family History Family History  Problem Relation Age of Onset  . Stroke Mother   . Kidney disease Mother   . Lung cancer Father     stage IV   . Stomach cancer Maternal Grandfather   . Colon cancer Paternal Grandfather     Social History Social History  Substance Use Topics  . Smoking status: Current Every Day Smoker    Packs/day: 1.00    Years: 15.00    Types: Cigarettes  . Smokeless tobacco: Never Used  . Alcohol use No     Comment: quit      Allergies   Codeine and Demerol [meperidine]   Review of Systems Review of Systems  Constitutional: Negative for chills and fever.  Eyes: Positive for visual disturbance.  Cardiovascular: Positive for palpitations.  Gastrointestinal: Positive for nausea. Negative for diarrhea and vomiting.  Neurological: Positive for dizziness. Negative for weakness and numbness.  All other systems reviewed and are negative.    Physical Exam Updated Vital Signs BP (!) 158/110 (BP Location: Left Arm)   Pulse 88   Temp 98.3 F (36.8 C) (Oral)   Resp 20   Ht 5\' 4"  (1.626 m)   Wt 83.9 kg   SpO2 98%   BMI 31.76 kg/m   Physical Exam  Constitutional: She appears well-developed and well-nourished. No distress.   HENT:  Head: Normocephalic and atraumatic.  Right Ear: External ear normal.  Left Ear: External ear normal.  Eyes: Conjunctivae are normal. Right eye exhibits no discharge. Left eye exhibits no discharge. No scleral icterus.  No papilledema with funduscopic exam.  Neck: Neck supple. No tracheal deviation present.  Cardiovascular: Normal rate, regular rhythm and intact distal pulses.   Pulmonary/Chest: Effort normal and breath sounds normal. No stridor. No respiratory distress. She has no wheezes. She has no rales.  Abdominal: Soft. Bowel sounds are normal. She exhibits no distension. There is no tenderness. There is no rebound and no guarding.  Musculoskeletal: She exhibits no edema or  tenderness.  Neurological: She is alert. She has normal strength. No cranial nerve deficit (no facial droop, extraocular movements intact, no slurred speech) or sensory deficit. She exhibits normal muscle tone. She displays no seizure activity. Coordination normal.  Skin: Skin is warm and dry. No rash noted.  Psychiatric: She has a normal mood and affect.  Nursing note and vitals reviewed.    ED Treatments / Results  DIAGNOSTIC STUDIES: Oxygen Saturation is 100% on RA, normal by my interpretation.    COORDINATION OF CARE: 6:00 PM Discussed treatment plan with pt at bedside which includes lab work and pt agreed to plan.  Labs (all labs ordered are listed, but only abnormal results are displayed) Labs Reviewed  BASIC METABOLIC PANEL - Abnormal; Notable for the following:       Result Value   Chloride 100 (*)    Glucose, Bld 111 (*)    All other components within normal limits  CBC WITH DIFFERENTIAL/PLATELET  ETHANOL    EKG  EKG Interpretation  Date/Time:  Sunday December 05 2016 16:59:10 EST Ventricular Rate:  93 PR Interval:  168 QRS Duration: 84 QT Interval:  342 QTC Calculation: 425 R Axis:   28 Text Interpretation:  Atrial-paced rhythm Abnormal ECG No significant change since last  tracing Confirmed by Wyndell Cardiff  MD-J, Kassandra Meriweather ([id number]) on 12/05/2016 5:14:13 PM       Radiology Ct Head Wo Contrast  Result Date: 12/05/2016 CLINICAL DATA:  Blurred vision for 2 days.  Hypertension. EXAM: CT HEAD WITHOUT CONTRAST TECHNIQUE: Contiguous axial images were obtained from the base of the skull through the vertex without intravenous contrast. COMPARISON:  09/24/2016 head CT. FINDINGS: Brain: No evidence of parenchymal hemorrhage or extra-axial fluid collection. No mass lesion, mass effect, or midline shift. No CT evidence of acute infarction. Cerebral volume is age appropriate. No ventriculomegaly. Vascular: No hyperdense vessel or unexpected calcification. Skull: No evidence of calvarial fracture. Sinuses/Orbits: The visualized paranasal sinuses are essentially clear. Other: Small residual scar in the left parietal scalp at the site of the large scalp hematoma seen on the 09/24/2016 head CT. The mastoid air cells are unopacified. IMPRESSION: No evidence of acute intracranial abnormality. Electronically Signed   By: Delbert PhenixJason A Poff M.D.   On: 12/05/2016 18:44    Procedures Procedures (including critical care time)  Medications Ordered in ED Medications  sodium chloride 0.9 % bolus 1,000 mL (1,000 mLs Intravenous New Bag/Given 12/05/16 1843)     Initial Impression / Assessment and Plan / ED Course  I have reviewed the triage vital signs and the nursing notes.  Pertinent labs & imaging results that were available during my care of the patient were reviewed by me and considered in my medical decision making (see chart for details).  Clinical Course   Pt presented with several complaints.   ED evaluation reassuring.  HTN but no evidence of end organ ischemia.  Neuro exam normal.  Doubt stroke or tia.  No anemia or dehydration.  Visual acuity without severe disturbance.  Pt appears safe for discharge.  Discussed follow up with pcp and consider seeing an eye doctor    Final Clinical  Impressions(s) / ED Diagnoses   Final diagnoses:  Blurred vision    New Prescriptions New Prescriptions   No medications on file   I personally performed the services described in this documentation, which was scribed in my presence.  The recorded information has been reviewed and is accurate.    Linwood DibblesJon Finis Hendricksen, MD 12/05/16 2003

## 2016-12-05 NOTE — ED Triage Notes (Signed)
Pt c/o blurred vision and increased BP since Tuesday.  Pt denies dizziness or nausea but states she feels "strange".  Pt has pacemaker implanted Oct 2017

## 2016-12-05 NOTE — ED Notes (Signed)
Pt given d/c instructions as per chart. Verbalizes understanding. No questions. 

## 2016-12-07 ENCOUNTER — Other Ambulatory Visit: Payer: Self-pay

## 2016-12-07 DIAGNOSIS — K8500 Idiopathic acute pancreatitis without necrosis or infection: Secondary | ICD-10-CM

## 2016-12-10 ENCOUNTER — Inpatient Hospital Stay: Admission: RE | Admit: 2016-12-10 | Payer: 59 | Source: Ambulatory Visit

## 2016-12-10 ENCOUNTER — Telehealth: Payer: Self-pay | Admitting: Gastroenterology

## 2016-12-10 NOTE — Telephone Encounter (Signed)
Ok, can you please contact patient find the reason for no show. If she is non compliant we may have to discharge from practice. Thanks

## 2016-12-10 NOTE — Telephone Encounter (Signed)
Dr. Lavon PaganiniNandigam please see note below.

## 2016-12-13 NOTE — Telephone Encounter (Signed)
Left message for pt to call back  °

## 2016-12-14 NOTE — Telephone Encounter (Signed)
Attempted to call pt again today. Received message that voice mailbox is full and cannot accept messages.

## 2016-12-15 NOTE — Telephone Encounter (Signed)
Attempted to call pt again. Received the same message that voicemail box is full. Letter mailed to pt. Dr. Lavon PaganiniNandigam notified.

## 2016-12-20 ENCOUNTER — Other Ambulatory Visit: Payer: Self-pay | Admitting: Nurse Practitioner

## 2016-12-22 ENCOUNTER — Other Ambulatory Visit: Payer: Self-pay | Admitting: *Deleted

## 2016-12-22 MED ORDER — METOPROLOL SUCCINATE ER 25 MG PO TB24: 25.0000 mg | ORAL_TABLET | Freq: Every day | ORAL | 2 refills | Status: DC

## 2017-01-11 ENCOUNTER — Ambulatory Visit (INDEPENDENT_AMBULATORY_CARE_PROVIDER_SITE_OTHER): Payer: Medicaid Other | Admitting: Internal Medicine

## 2017-01-11 VITALS — BP 110/80 | HR 102 | Ht 64.0 in | Wt 200.0 lb

## 2017-01-11 DIAGNOSIS — Z950 Presence of cardiac pacemaker: Secondary | ICD-10-CM

## 2017-01-11 DIAGNOSIS — R55 Syncope and collapse: Secondary | ICD-10-CM | POA: Diagnosis not present

## 2017-01-11 DIAGNOSIS — I442 Atrioventricular block, complete: Secondary | ICD-10-CM | POA: Diagnosis not present

## 2017-01-11 MED ORDER — DILTIAZEM HCL ER COATED BEADS 120 MG PO CP24: 120.0000 mg | ORAL_CAPSULE | Freq: Every day | ORAL | 3 refills | Status: DC

## 2017-01-11 NOTE — Progress Notes (Signed)
Patient Care Team: Cheron SchaumannGretchen Y. Velazquez, MD as PCP - General (Internal Medicine)   HPI  Brittney Cooper is a 56 y.o. female Seen at the hiatus of more than a year. She had a diagnosis of dysautonomic syncope. There. Other contributing complex medical comorbidities outlined in the old notes. Alcoholic induced pancreatitis is noted in the old chart 9/17  She had recurrent syncope with a monitor on. His was associated with a 6 second pause 2 . She had head trauma requiring laceration. Imaging was okay.    She underwent pacing with a Biotronik CLS   She has had interval syncope with head trauma. Her prodromes have been a few seconds but not longer for her to sit down. She was noted to be hypertensive. She was started on a diuretic. She's also had terrible nightmares. Her doctor's and consequently concern about sleep apnea     [facility]   Past Medical History:  Diagnosis Date  . ADHD (attention deficit hyperactivity disorder)   . 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 Anxiety   . Arthritis   . Bipolar disorder (HCC)   . Chronic pain    back  . Depression   . Fatty liver   . Fracture of lateral malleolus of right fibula   . Hepatomegaly   . Hypertension   . OCD (obsessive compulsive disorder)   . Pancreatic pseudocyst   . Panic attack   . 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 Syncope    neurocardiogenic    Past Surgical History:  Procedure Laterality Date  . ABDOMINAL HYSTERECTOMY    . ANTERIOR AND POSTERIOR REPAIR    . BUNIONECTOMY Bilateral   . EP IMPLANTABLE DEVICE N/A 10/12/2016   Procedure: Pacemaker Implant;  Surgeon: Will Jorja LoaMartin Camnitz, MD;  Location: [facility];  Service: Cardiovascular;  Laterality: N/A;  . finfger surgery    . FINGER SURGERY    . LTCS    . MYRINGOTOMY    . ORIF ANKLE FRACTURE Right 07/25/2015   Procedure: OPEN  REDUCTION INTERNAL FIXATION (ORIF) RIGHT ANKLE ;  Surgeon: Sheral Apleyimothy D Murphy, MD;  Location: [facility];  Service: Orthopedics;  Laterality: Right;  . PACEMAKER PLACEMENT    . RHINOPLASTY    . TONSILLECTOMY      Current Outpatient Prescriptions  Medication Sig Dispense Refill  . acetaminophen (TYLENOL) 500 MG tablet Take 1,000 mg by mouth every 6 (six) hours as needed for mild pain.    Marland Kitchen. ALPRAZolam (XANAX) 0.5 MG tablet Take 0.25 mg by mouth 5 (five) times daily as needed for anxiety.     Marland Kitchen. amphetamine-dextroamphetamine (ADDERALL) 20 MG tablet Take 20 mg by mouth 3 (three) times daily.    Marland Kitchen. aspirin EC 81 MG tablet Take 81 mg by mouth daily.    Marland Kitchen. atorvastatin (LIPITOR) 40 MG tablet Take 40 mg by mouth daily at 6 PM.     . Brexpiprazole (REXULTI) 1 MG TABS Take 1 mg by mouth daily.    . folic acid (FOLVITE) 1 MG tablet Take 1 tablet (1 mg total) by mouth daily. 30 tablet 0  . hydrochlorothiazide (MICROZIDE) 12.5 MG capsule Take 12.5 mg by mouth daily.    Marland Kitchen. losartan (COZAAR) 100 MG tablet Take 100 mg by mouth daily.     . metoprolol succinate (TOPROL-XL) 25 MG 24 hr  tablet Take 1 tablet (25 mg total) by mouth daily. 90 tablet 2  . Multiple Vitamin (MULTIVITAMIN WITH MINERALS) TABS tablet Take 1 tablet by mouth daily. 30 tablet 0  . sertraline (ZOLOFT) 100 MG tablet Take 150 mg by mouth daily.     Marland Kitchen thiamine 100 MG tablet Take 1 tablet (100 mg total) by mouth daily. 30 tablet 0  . traZODone (DESYREL) 50 MG tablet Take 100-150 mg by mouth at bedtime as needed for sleep. For sleep.     No current facility-administered medications for this visit.     Allergies  Allergen Reactions  . Codeine Nausea And Vomiting  . Demerol [Meperidine] Other (See Comments)    Hallucinations       Review of Systems negative except from HPI and PMH  Physical Exam BP 110/80   Pulse (!) 102   Ht 5\' 4"  (1.626 m)   Wt 200 lb (90.7 kg)   SpO2 98%   BMI 34.33 kg/m  Well developed and well  nourished in no acute distress HENT normal E scleral and icterus clear Neck Supple JVP flat; carotids brisk and full Clear to ausculation Device pocket well healed; without hematoma or erythema.  There is no tethering   *Regular rate and rhythm, no murmurs gallops or rub Soft with active bowel sounds No clubbing cyanosis  Edema Alert and oriented, grossly normal motor and sensory function Skin Warm and Dry  ECG demonstrates sinus at 100 Intervals 12/04/31   Assessment and  Plan  Syncope neurally mediated  Complete Heart BLock  Pacemaker  -- Biotronik   Hypertension  Nightmares    The patient is nightmares may be related to beta blockers. We will discontinue them.  Blood pressures been quite elevated. Unfortunately diuretics can aggravate autonomic dysfunction because of hypovolemia. We'll discontinue the diuretic and try her on diltiazem.  We have reprogrammed her CLS to increase her rate from +40--+50. We have also made it increasingly sensitive from high--very high. This may contribute to her nocturnal palpitations at bedtime as well as shortness of breath when she is talking. We have reviewed the potential impact of Valsalva with these maneuvers  More than 50% of 45 min was spent in counseling related to the above

## 2017-01-11 NOTE — Patient Instructions (Addendum)
Medication Instructions: - Your physician has recommended you make the following change in your medication:  1) Stop toprol (metoprolol succinate) 2) Stop maxide 3) Start Cardizem (diltiazem) 120 mg- take one capsule by mouth once daily  Labwork: - none ordered  Procedures/Testing: - none ordered  Follow-Up: - Your physician recommends that you schedule a follow-up appointment in: 3 months with Dr. Graciela HusbandsKlein.  Any Additional Special Instructions Will Be Listed Below (If Applicable).     If you need a refill on your cardiac medications before your next appointment, please call your pharmacy.

## 2017-01-20 ENCOUNTER — Telehealth: Payer: Self-pay | Admitting: Internal Medicine

## 2017-01-20 NOTE — Telephone Encounter (Signed)
Forms given to Selena BattenKim in Medical Records to go ahead and scan.  She is aware the patient will be here at 3 pm this afternoon to pick up.

## 2017-01-20 NOTE — Telephone Encounter (Signed)
New message     Patient wanted the nurse know she will arrived at the office 3 pm to pick up form

## 2017-02-03 ENCOUNTER — Emergency Department (HOSPITAL_COMMUNITY): Payer: Medicaid Other

## 2017-02-03 ENCOUNTER — Emergency Department (HOSPITAL_COMMUNITY)
Admission: EM | Admit: 2017-02-03 | Discharge: 2017-02-04 | Disposition: A | Discharge status: Home / Self Care | Payer: Medicaid Other | Attending: Emergency Medicine | Admitting: Emergency Medicine

## 2017-02-03 ENCOUNTER — Encounter (HOSPITAL_COMMUNITY): Payer: Self-pay

## 2017-02-03 DIAGNOSIS — F909 Attention-deficit hyperactivity disorder, unspecified type: Secondary | ICD-10-CM | POA: Diagnosis not present

## 2017-02-03 DIAGNOSIS — F17210 Nicotine dependence, cigarettes, uncomplicated: Secondary | ICD-10-CM | POA: Insufficient documentation

## 2017-02-03 DIAGNOSIS — F1010 Alcohol abuse, uncomplicated: Secondary | ICD-10-CM

## 2017-02-03 DIAGNOSIS — Z79899 Other long term (current) drug therapy: Secondary | ICD-10-CM | POA: Insufficient documentation

## 2017-02-03 DIAGNOSIS — Z950 Presence of cardiac pacemaker: Secondary | ICD-10-CM | POA: Insufficient documentation

## 2017-02-03 DIAGNOSIS — I10 Essential (primary) hypertension: Secondary | ICD-10-CM | POA: Insufficient documentation

## 2017-02-03 DIAGNOSIS — R45851 Suicidal ideations: Secondary | ICD-10-CM

## 2017-02-03 DIAGNOSIS — Z7982 Long term (current) use of aspirin: Secondary | ICD-10-CM | POA: Diagnosis not present

## 2017-02-03 DIAGNOSIS — R002 Palpitations: Secondary | ICD-10-CM | POA: Diagnosis not present

## 2017-02-03 LAB — ACETAMINOPHEN LEVEL: Acetaminophen (Tylenol), Serum: 12 ug/mL (ref 10–30)

## 2017-02-03 LAB — COMPREHENSIVE METABOLIC PANEL
ALBUMIN: 4.3 g/dL (ref 3.5–5.0)
ALK PHOS: 101 U/L (ref 38–126)
ALT: 54 U/L (ref 14–54)
AST: 80 U/L — AB (ref 15–41)
Anion gap: 21 — ABNORMAL HIGH (ref 5–15)
BILIRUBIN TOTAL: 1 mg/dL (ref 0.3–1.2)
BUN: 18 mg/dL (ref 6–20)
CO2: 17 mmol/L — ABNORMAL LOW (ref 22–32)
CREATININE: 0.79 mg/dL (ref 0.44–1.00)
Calcium: 9.4 mg/dL (ref 8.9–10.3)
Chloride: 98 mmol/L — ABNORMAL LOW (ref 101–111)
GFR calc Af Amer: >60 mL/min (ref >60)
Glucose, Bld: 156 mg/dL — ABNORMAL HIGH (ref 65–99)
POTASSIUM: 3.9 mmol/L (ref 3.5–5.1)
Sodium: 136 mmol/L (ref 135–145)
TOTAL PROTEIN: 7.3 g/dL (ref 6.5–8.1)

## 2017-02-03 LAB — CBC WITH DIFFERENTIAL/PLATELET
BASOS ABS: 0 10*3/uL (ref 0.0–0.1)
BASOS PCT: 0 %
Eosinophils Absolute: 0.1 10*3/uL (ref 0.0–0.7)
Eosinophils Relative: 1 %
HEMATOCRIT: 46.2 % — AB (ref 36.0–46.0)
HEMOGLOBIN: 16.3 g/dL — AB (ref 12.0–15.0)
LYMPHS PCT: 20 %
Lymphs Abs: 1.9 10*3/uL (ref 0.7–4.0)
MCH: 34.1 pg — ABNORMAL HIGH (ref 26.0–34.0)
MCHC: 35.3 g/dL (ref 30.0–36.0)
MCV: 96.7 fL (ref 78.0–100.0)
MONO ABS: 0.6 10*3/uL (ref 0.1–1.0)
Monocytes Relative: 7 %
NEUTROS ABS: 6.8 10*3/uL (ref 1.7–7.7)
NEUTROS PCT: 72 %
Platelets: 233 10*3/uL (ref 150–400)
RBC: 4.78 MIL/uL (ref 3.87–5.11)
RDW: 14.6 % (ref 11.5–15.5)
WBC: 9.4 10*3/uL (ref 4.0–10.5)

## 2017-02-03 LAB — I-STAT TROPONIN, ED
TROPONIN I, POC: 0 ng/mL (ref 0.00–0.08)
TROPONIN I, POC: 0 ng/mL (ref 0.00–0.08)

## 2017-02-03 LAB — MAGNESIUM: Magnesium: 2.1 mg/dL (ref 1.7–2.4)

## 2017-02-03 LAB — ETHANOL: ALCOHOL ETHYL (B): 112 mg/dL — AB (ref <5)

## 2017-02-03 LAB — D-DIMER, QUANTITATIVE (NOT AT ARMC): D DIMER QUANT: 1.06 ug{FEU}/mL — AB (ref 0.00–0.50)

## 2017-02-03 LAB — LIPASE, BLOOD: LIPASE: 73 U/L — AB (ref 11–51)

## 2017-02-03 LAB — SALICYLATE LEVEL: Salicylate Lvl: <7 mg/dL (ref 2.8–30.0)

## 2017-02-03 MED ORDER — IOPAMIDOL (ISOVUE-370) INJECTION 76%
INTRAVENOUS | Status: AC
  Administered 2017-02-03: 100 mL
  Filled 2017-02-03: qty 100

## 2017-02-03 MED ORDER — LORAZEPAM 1 MG PO TABS
0.0000 mg | ORAL_TABLET | Freq: Four times a day (QID) | ORAL | Status: DC
  Administered 2017-02-03: 1 mg via ORAL
  Administered 2017-02-03 – 2017-02-04 (×3): 2 mg via ORAL
  Filled 2017-02-03: qty 2
  Filled 2017-02-03: qty 1
  Filled 2017-02-03 (×2): qty 2

## 2017-02-03 MED ORDER — ONDANSETRON HCL 4 MG PO TABS
4.0000 mg | ORAL_TABLET | Freq: Three times a day (TID) | ORAL | Status: DC | PRN
  Administered 2017-02-03 – 2017-02-04 (×2): 4 mg via ORAL
  Filled 2017-02-03 (×2): qty 1

## 2017-02-03 MED ORDER — ASPIRIN EC 81 MG PO TBEC
81.0000 mg | DELAYED_RELEASE_TABLET | Freq: Every day | ORAL | Status: DC
  Administered 2017-02-04: 81 mg via ORAL
  Filled 2017-02-03: qty 1

## 2017-02-03 MED ORDER — ONDANSETRON HCL 4 MG/2ML IJ SOLN
4.0000 mg | Freq: Once | INTRAMUSCULAR | Status: AC
  Administered 2017-02-03: 4 mg via INTRAVENOUS
  Filled 2017-02-03: qty 2

## 2017-02-03 MED ORDER — GI COCKTAIL ~~LOC~~
30.0000 mL | Freq: Once | ORAL | Status: AC
  Administered 2017-02-03: 30 mL via ORAL
  Filled 2017-02-03: qty 30

## 2017-02-03 MED ORDER — FOLIC ACID 1 MG PO TABS
1.0000 mg | ORAL_TABLET | Freq: Every day | ORAL | Status: DC
  Administered 2017-02-03 – 2017-02-04 (×2): 1 mg via ORAL
  Filled 2017-02-03 (×2): qty 1

## 2017-02-03 MED ORDER — TRAZODONE HCL 50 MG PO TABS
100.0000 mg | ORAL_TABLET | Freq: Every evening | ORAL | Status: DC | PRN
  Administered 2017-02-03: 100 mg via ORAL
  Filled 2017-02-03: qty 2

## 2017-02-03 MED ORDER — DILTIAZEM HCL ER COATED BEADS 120 MG PO CP24
120.0000 mg | ORAL_CAPSULE | Freq: Once | ORAL | Status: AC
  Administered 2017-02-03: 120 mg via ORAL
  Filled 2017-02-03: qty 1

## 2017-02-03 MED ORDER — BREXPIPRAZOLE 0.5 MG PO TABS: 0.5000 mg | ORAL_TABLET | Freq: Every day | ORAL | Status: DC

## 2017-02-03 MED ORDER — AMPHETAMINE-DEXTROAMPHETAMINE 20 MG PO TABS: 20.0000 mg | ORAL_TABLET | Freq: Three times a day (TID) | ORAL | Status: DC

## 2017-02-03 MED ORDER — LORAZEPAM 2 MG/ML IJ SOLN
2.0000 mg | Freq: Once | INTRAMUSCULAR | Status: AC
  Administered 2017-02-03: 2 mg via INTRAVENOUS
  Filled 2017-02-03: qty 1

## 2017-02-03 MED ORDER — ACETAMINOPHEN 325 MG PO TABS
650.0000 mg | ORAL_TABLET | ORAL | Status: DC | PRN
  Administered 2017-02-03 – 2017-02-04 (×3): 650 mg via ORAL
  Filled 2017-02-03 (×4): qty 2

## 2017-02-03 MED ORDER — SODIUM CHLORIDE 0.9 % IV BOLUS (SEPSIS)
1000.0000 mL | Freq: Once | INTRAVENOUS | Status: AC
  Administered 2017-02-03: 1000 mL via INTRAVENOUS

## 2017-02-03 MED ORDER — ALPRAZOLAM 0.25 MG PO TABS
0.2500 mg | ORAL_TABLET | Freq: Every day | ORAL | Status: DC | PRN
  Administered 2017-02-03 – 2017-02-04 (×4): 0.25 mg via ORAL
  Filled 2017-02-03 (×4): qty 1

## 2017-02-03 MED ORDER — AMPHETAMINE-DEXTROAMPHETAMINE 20 MG PO TABS
20.0000 mg | ORAL_TABLET | Freq: Three times a day (TID) | ORAL | Status: DC
  Administered 2017-02-04: 20 mg via ORAL
  Filled 2017-02-03: qty 2

## 2017-02-03 MED ORDER — LOSARTAN POTASSIUM 50 MG PO TABS
100.0000 mg | ORAL_TABLET | Freq: Every day | ORAL | Status: DC
  Administered 2017-02-04: 100 mg via ORAL
  Filled 2017-02-03 (×2): qty 2

## 2017-02-03 MED ORDER — DILTIAZEM HCL ER COATED BEADS 120 MG PO CP24
120.0000 mg | ORAL_CAPSULE | Freq: Every day | ORAL | Status: DC
  Administered 2017-02-04: 120 mg via ORAL
  Filled 2017-02-03: qty 1

## 2017-02-03 MED ORDER — LORAZEPAM 1 MG PO TABS: 0.0000 mg | ORAL_TABLET | Freq: Two times a day (BID) | ORAL | Status: DC

## 2017-02-03 MED ORDER — SERTRALINE HCL 100 MG PO TABS
150.0000 mg | ORAL_TABLET | Freq: Every day | ORAL | Status: DC
  Administered 2017-02-03 – 2017-02-04 (×2): 150 mg via ORAL
  Filled 2017-02-03: qty 1
  Filled 2017-02-03: qty 3

## 2017-02-03 MED ORDER — LORAZEPAM 2 MG/ML IJ SOLN: 1.0000 mg | Freq: Once | INTRAMUSCULAR | Status: DC

## 2017-02-03 MED ORDER — BREXPIPRAZOLE 1 MG PO TABS
0.5000 mg | ORAL_TABLET | Freq: Every day | ORAL | Status: DC
  Administered 2017-02-04: 0.5 mg via ORAL
  Filled 2017-02-03 (×2): qty 1

## 2017-02-03 MED ORDER — VITAMIN B-1 100 MG PO TABS
100.0000 mg | ORAL_TABLET | Freq: Every day | ORAL | Status: DC
  Administered 2017-02-03 – 2017-02-04 (×2): 100 mg via ORAL
  Filled 2017-02-03: qty 2
  Filled 2017-02-03: qty 1
  Filled 2017-02-03: qty 2

## 2017-02-03 MED ORDER — ALUM & MAG HYDROXIDE-SIMETH 200-200-20 MG/5ML PO SUSP
30.0000 mL | ORAL | Status: DC | PRN
  Administered 2017-02-03 – 2017-02-04 (×2): 30 mL via ORAL
  Filled 2017-02-03 (×2): qty 30

## 2017-02-03 MED ORDER — ATORVASTATIN CALCIUM 40 MG PO TABS
40.0000 mg | ORAL_TABLET | Freq: Every day | ORAL | Status: DC
  Administered 2017-02-03: 40 mg via ORAL
  Filled 2017-02-03 (×2): qty 1

## 2017-02-03 MED ORDER — CHLORDIAZEPOXIDE HCL 25 MG PO CAPS
50.0000 mg | ORAL_CAPSULE | Freq: Once | ORAL | Status: AC
  Administered 2017-02-03: 50 mg via ORAL
  Filled 2017-02-03: qty 2

## 2017-02-03 MED ORDER — BREXPIPRAZOLE 1 MG PO TABS: 0.5000 mg | ORAL_TABLET | Freq: Every day | ORAL | Status: DC

## 2017-02-03 NOTE — ED Notes (Signed)
TTS machine at bedside. 

## 2017-02-03 NOTE — ED Provider Notes (Signed)
[facility] DEPT Provider Note   CSN: [id number] Arrival date & time: 02/03/17  1019     History   Chief Complaint Chief Complaint  Patient presents with  . Palpitations  . Alcohol Problem    HPI RAQUELL RICHER is a 56 y.o. female.  HPI 56 year old female who presents with palpitations. She has a history of alcohol abuse, bipolar disorder, OCD, PTSD, complete heart block status post pacemaker placement, hypertension, and hyperlipidemia.  States that since her pacemaker placement in October 2017 she has had chronic palpitations, dyspnea, fatigue, and not feeling well.  No fevers or chills. Has had mild cough. No lower extremity edema or calf tenderness. Starting last night has had right scapular pain, sharp and stabbing, intermittent in nature. It is not worse with deep inspirations. Is not worse with movement or palpation. Has had some shortness of breath and lightheaded with this.   She is also requesting treatment for potential alcohol problem. States that she drinks vodka daily and has had history of withdrawals in the past although no DTs or seizures. Her last drink was this morning. Has had nausea and vomiting over the past few days without diarrhea. With epigastric abdominal pain associated with drinking.  She is also requesting evaluation for depression. Has had gradually worsening depression over the past few months. Denies any suicidal ideations, but then states that she just wishes she could be hit by a truck. Denies any plan. Denies any homicidal thoughts, auditory or visual hallucinations.   Past Medical History:  Diagnosis Date  . ADHD (attention deficit hyperactivity disorder)   . 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 Anxiety   . Arthritis   . Bipolar disorder (HCC)   . Chronic pain    back  . Depression   . Fatty liver   . Fracture of lateral malleolus of right fibula   . Hepatomegaly   . Hypertension   . OCD (obsessive compulsive disorder)   .  Pancreatic pseudocyst   . Panic attack   . 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 Syncope    neurocardiogenic    Patient Active Problem List   Diagnosis Date Noted  . Complete heart block (HCC)   . Palpitation 10/11/2016  . Palpitations 10/11/2016  . Acute pancreatitis 09/15/2016  . Alcohol abuse 09/15/2016  . ADHD (attention deficit hyperactivity disorder) 09/15/2016  . Tobacco use 09/15/2016  . Anxiety 09/15/2016  . Hypokalemia 09/15/2016  . Pancreatitis 07/09/2016  . Alcoholic pancreatitis 07/08/2016  . Alcohol withdrawal (HCC) 07/08/2016  . Bipolar disorder (HCC) 07/08/2016  . Essential hypertension 07/08/2016  . Left shoulder pain 09/09/2014    Past Surgical History:  Procedure Laterality Date  . ABDOMINAL HYSTERECTOMY    . ANTERIOR AND POSTERIOR REPAIR    . BUNIONECTOMY Bilateral   . EP IMPLANTABLE DEVICE N/A 10/12/2016   Procedure: Pacemaker Implant;  Surgeon: Will Jorja Loa, MD;  [facility];  Service: Cardiovascular;  Laterality: N/A;  . finfger surgery    . FINGER SURGERY    . LTCS    . MYRINGOTOMY    . ORIF ANKLE FRACTURE Right 07/25/2015   Procedure: OPEN REDUCTION INTERNAL FIXATION (ORIF) RIGHT ANKLE ;  Surgeon: Sheral Apley, MD;  Location: [facility];  Service: Orthopedics;  Laterality: Right;  . PACEMAKER PLACEMENT    . RHINOPLASTY    . TONSILLECTOMY  OB History    No data available       Home Medications    Prior to Admission medications   Medication Sig Start Date End Date Taking? Authorizing Provider  acetaminophen (TYLENOL) 500 MG tablet Take 1,000 mg by mouth every 6 (six) hours as needed for mild pain.   Yes Historical Provider, MD  ALPRAZolam Prudy Feeler(XANAX) 0.5 MG tablet Take 0.25 mg by mouth 5 (five) times daily as needed for anxiety.    Yes Historical Provider, MD  amphetamine-dextroamphetamine (ADDERALL) 20 MG tablet Take 20 mg by  mouth 3 (three) times daily.   Yes Historical Provider, MD  aspirin EC 81 MG tablet Take 81 mg by mouth daily.   Yes Historical Provider, MD  atorvastatin (LIPITOR) 40 MG tablet Take 40 mg by mouth daily at 6 PM.    Yes Historical Provider, MD  Brexpiprazole (REXULTI) 0.5 MG TABS Take 0.5 mg by mouth daily.   Yes Historical Provider, MD  diltiazem (CARDIZEM CD) 120 MG 24 hr capsule Take 1 capsule (120 mg total) by mouth daily. 01/11/17 04/11/17 Yes Duke SalviaSteven C Klein, MD  folic acid (FOLVITE) 1 MG tablet Take 1 tablet (1 mg total) by mouth daily. 09/19/16  Yes Mir Vergie LivingMohammed Ikramullah, MD  losartan (COZAAR) 100 MG tablet Take 100 mg by mouth daily.  06/12/14  Yes Historical Provider, MD  Multiple Vitamin (MULTIVITAMIN WITH MINERALS) TABS tablet Take 1 tablet by mouth daily. 09/19/16  Yes Mir Vergie LivingMohammed Ikramullah, MD  sertraline (ZOLOFT) 100 MG tablet Take 150 mg by mouth daily.    Yes Historical Provider, MD  thiamine 100 MG tablet Take 1 tablet (100 mg total) by mouth daily. 09/19/16  Yes Mir Vergie LivingMohammed Ikramullah, MD  traZODone (DESYREL) 50 MG tablet Take 100-150 mg by mouth at bedtime as needed for sleep. For sleep. 06/30/16  Yes Historical Provider, MD  Brexpiprazole (REXULTI) 1 MG TABS Take 1 mg by mouth daily.    Historical Provider, MD    Family History Family History  Problem Relation Age of Onset  . Stroke Mother   . Kidney disease Mother   . Lung cancer Father     stage IV   . Stomach cancer Maternal Grandfather   . Colon cancer Paternal Grandfather     Social History Social History  Substance Use Topics  . Smoking status: Current Every Day Smoker    Packs/day: 1.00    Years: 15.00    Types: Cigarettes  . Smokeless tobacco: Never Used  . Alcohol use 0.0 oz/week     Comment: vodka daily      Allergies   Codeine and Demerol [meperidine]   Review of Systems Review of Systems 10/14 systems reviewed and are negative other than those stated in the HPI   Physical Exam Updated Vital  Signs BP (!) 162/109   Pulse (!) 130   Temp 97.8 F (36.6 C) (Oral)   Resp 22   Wt 200 lb (90.7 kg)   SpO2 94%   BMI 34.33 kg/m   Physical Exam Physical Exam  Nursing note and vitals reviewed. Constitutional: anxious appearing, non-toxic, and in no acute distress Head: Normocephalic and atraumatic.  Mouth/Throat: Oropharynx is clear and moist.  Neck: Normal range of motion. Neck supple.  Cardiovascular: Tachycardic rate and regular rhythm.  no edema Pulmonary/Chest: Effort normal and breath sounds normal.  Abdominal: Soft. There is no tenderness. There is no rebound and no guarding.  Musculoskeletal: Normal range of motion.  Neurological: Alert, no facial droop,  fluent speech, moves all extremities symmetrically Skin: Skin is warm and dry.  Psychiatric: Cooperative   ED Treatments / Results  Labs (all labs ordered are listed, but only abnormal results are displayed) Labs Reviewed  CBC WITH DIFFERENTIAL/PLATELET - Abnormal; Notable for the following:       Result Value   Hemoglobin 16.3 (*)    HCT 46.2 (*)    MCH 34.1 (*)    All other components within normal limits  COMPREHENSIVE METABOLIC PANEL - Abnormal; Notable for the following:    Chloride 98 (*)    CO2 17 (*)    Glucose, Bld 156 (*)    AST 80 (*)    Anion gap 21 (*)    All other components within normal limits  LIPASE, BLOOD - Abnormal; Notable for the following:    Lipase 73 (*)    All other components within normal limits  ETHANOL - Abnormal; Notable for the following:    Alcohol, Ethyl (B) 112 (*)    All other components within normal limits  D-DIMER, QUANTITATIVE (NOT AT [facility]) - Abnormal; Notable for the following:    D-Dimer, Quant 1.06 (*)    All other components within normal limits  MAGNESIUM  ACETAMINOPHEN LEVEL  SALICYLATE LEVEL  I-STAT TROPOININ, ED  I-STAT TROPOININ, ED    EKG  EKG Interpretation  Date/Time:  Thursday February 03 2017 10:36:05 EST Ventricular Rate:  115 PR  Interval:    QRS Duration: 82 QT Interval:  302 QTC Calculation: 407 R Axis:   -6 Text Interpretation:  A-V dual-paced complexes w/ some inhibition No further analysis attempted due to paced rhythm Baseline wander in lead(s) I II III aVR aVF V2 V3 V4 V5 av paced rythm with intermittent sinus rythm, PVC  Confirmed by Jadelin Eng MD, Ayo Guarino ([id number]) on 02/03/2017 11:25:21 AM       Radiology Dg Chest 2 View  Result Date: 02/03/2017 CLINICAL DATA:  Right posterior chest pain. Shortness of breath. Palpitations. EXAM: CHEST  2 VIEW COMPARISON:  10/13/2016 FINDINGS: Dual lead transvenous pacemaker remains in appropriate position. Heart size and mediastinal contours are normal. Both lungs are clear. No evidence of pneumothorax or pleural effusion. IMPRESSION: Stable exam.  No active cardiopulmonary disease. Electronically Signed   By: Myles Rosenthal M.D.   On: 02/03/2017 11:33   Ct Angio Chest Pe W And/or Wo Contrast  Result Date: 02/03/2017 CLINICAL DATA:  56 year old female with cough shortness of breath and chest pain for 1 day. Scapular region pain. Tachycardia. Initial encounter. Smoker. EXAM: CT ANGIOGRAPHY CHEST WITH CONTRAST TECHNIQUE: Multidetector CT imaging of the chest was performed using the standard protocol during bolus administration of intravenous contrast. Multiplanar CT image reconstructions and MIPs were obtained to evaluate the vascular anatomy. CONTRAST:  100 mL Isovue 370 COMPARISON:  Chest radiographs 1109 hours today and earlier. CT Abdomen and Pelvis 09/15/2016. FINDINGS: Cardiovascular: Good contrast bolus timing in the pulmonary arterial tree. Mild respiratory motion artifact. No focal filling defect identified in the pulmonary arteries to suggest acute pulmonary embolism. No pericardial effusion. Negative visualized aorta. Cardiac pacemaker type device. No definite calcified coronary artery atherosclerosis. Mediastinum/Nodes: Mild mediastinal lipomatosis. No lymphadenopathy. Lungs/Pleura: Major  airways are patent. No pleural effusion. Minor dependent pulmonary atelectasis. Mild scarring or atelectasis in both middle lobes. No other confluent pulmonary opacity. Upper Abdomen: Severe hepatic steatosis. Of note, there has been increased mass effect on the hepatic veins and hepatic IVC when compared to the CT Abdomen and Pelvis in September. It  is unclear whether this might be related to a degree of dehydration. Negative visualized stomach, spleen, and left adrenal gland. Musculoskeletal: No acute osseous abnormality identified. Review of the MIP images confirms the above findings. IMPRESSION: 1. No evidence of acute pulmonary embolus. Negative visualized aorta. 2. No acute findings in the chest, minor pulmonary atelectasis and mild atelectasis or scarring in both middle lobes. 3. Severe fatty liver disease. Increased mass effect on the hepatic veins and hepatic IVC compared to the September CT Abdomen and Pelvis, but perhaps this could be related to a degree of dehydration rather than progressive hepatic disease. Electronically Signed   By: Odessa Fleming M.D.   On: 02/03/2017 13:02    Procedures Procedures (including critical care time)  Medications Ordered in ED Medications  LORazepam (ATIVAN) tablet 0-4 mg (not administered)    Followed by  LORazepam (ATIVAN) tablet 0-4 mg (not administered)  acetaminophen (TYLENOL) tablet 650 mg (not administered)  ondansetron (ZOFRAN) tablet 4 mg (not administered)  alum & mag hydroxide-simeth (MAALOX/MYLANTA) 200-200-20 MG/5ML suspension 30 mL (not administered)  sodium chloride 0.9 % bolus 1,000 mL (0 mLs Intravenous Stopped 02/03/17 1217)  ondansetron (ZOFRAN) injection 4 mg (4 mg Intravenous Given 02/03/17 1058)  LORazepam (ATIVAN) injection 2 mg (2 mg Intravenous Given 02/03/17 1058)  chlordiazePOXIDE (LIBRIUM) capsule 50 mg (50 mg Oral Given 02/03/17 1216)  gi cocktail (Maalox,Lidocaine,Donnatal) (30 mLs Oral Given 02/03/17 1217)  iopamidol (ISOVUE-370) 76 %  injection (100 mLs  Contrast Given 02/03/17 1238)  diltiazem (CARDIZEM CD) 24 hr capsule 120 mg (120 mg Oral Given 02/03/17 1429)     Initial Impression / Assessment and Plan / ED Course  I have reviewed the triage vital signs and the nursing notes.  Pertinent labs & imaging results that were available during my care of the patient were reviewed by me and considered in my medical decision making (see chart for details).     Presenting with multiple complaints. She is non-toxic and in no acute distress. Mildly tachycardic, but EKG showing sinus rhythm with occasional PVCs. Has not taken cardizem today, which we will give dose of.   Her scapular pain seems atypical for that of ACS. Her EKG shows no acute ischemic changes and troponin is normal. She did undergo d-dimer testing which was positive. Subsequent CT angiogram of the chest is visualized and shows no PE, dissection, or other acute intrathoracic or cardiopulmonary processes.   Blood work is reassuring. No evidence of pancreatitis and LFTs are unremarkable. I suspect that her epigastric pain is related to alcohol gastritis. Her abdomen is overall soft and benign. She has mildly elevated alcohol level 110s. She did receive dose of Librium and ativan for potential early withdrawal symptom on she has remained stable.  She is felt to be medically cleared and TTS his consultative regarding her depression and passive suicidal thoughts. They did recommend inpatient admission for psychiatric evaluation. Pending placement at this time.  Final Clinical Impressions(s) / ED Diagnoses   Final diagnoses:  Palpitations  Alcohol abuse  Suicide ideation    New Prescriptions New Prescriptions   No medications on file     Lavera Guise, MD 02/03/17 1537

## 2017-02-03 NOTE — ED Triage Notes (Signed)
Pt. Coming from home via [facility] for palpitations x3 weeks and ETOH problem with depression. Pt. Switched from metoprolol to cardizem 3 weeks ago because of night terrors. Pt. sts she drinks ETOH (vodka) daily and last drink this morning. Pt. Denies seizures with withdrawal. Pt. Also feels her pancreatitis is returning. Pt. Given 324 ASA en route. Pt. Recent pace maker placement in October for 3rd degree heart block. EDP at bedside.

## 2017-02-03 NOTE — ED Notes (Signed)
Pt. Valuables placed in security in lock box 3. Pt. Given copy of valuables inventory. Valuables include 1800.86 cash, pink iphone, wallet with multiple debit and credit cards, and pacemaker box.

## 2017-02-03 NOTE — ED Notes (Signed)
Pt. sts she wants to get hit by a bus because life is too hard to deal with.

## 2017-02-03 NOTE — ED Notes (Signed)
Got patient into a gown and on the monitor did ekg shown to Dr Joni FearsLui

## 2017-02-03 NOTE — BH Assessment (Signed)
Tele Assessment Note   Brittney Cooper is an 56 y.o. female. Pt states she has thoughts of "not wanting to be alive." Per Pt she thinks about getting hit by a truck daily. Pt denies HI and AVH. Pt reports the following stressors: recent heart sugrery, father has stage IV cancer, mother is sick, and work issues. Pt reports depressive symptoms. According to the Pt, she has not been out of her home in 2 weeks, she has not showered in 2 weeks, and she has not brushed her teeth in 2 weeks. Pt states she is currently on a medical leave from work due to her depression. Pt states she has a therapist and psychiatrist. Pt states she is currently prescribed Xanax, Zoloft, and Adderall. Pt is seen by outpatient providers every 2 months.   Writer consulted with Jacki Cones, NP. Per Jacki Cones, NP Pt meets inpatient criteria. TTS to seek placement.  Diagnosis:  F33,2 MDD, recurrent, severe  Past Medical History:  Past Medical History:  Diagnosis Date  . ADHD (attention deficit hyperactivity disorder)   . 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 Anxiety   . Arthritis   . Bipolar disorder (HCC)   . Chronic pain    back  . Depression   . Fatty liver   . Fracture of lateral malleolus of right fibula   . Hepatomegaly   . Hypertension   . OCD (obsessive compulsive disorder)   . Pancreatic pseudocyst   . Panic attack   . 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 Syncope    neurocardiogenic    Past Surgical History:  Procedure Laterality Date  . ABDOMINAL HYSTERECTOMY    . ANTERIOR AND POSTERIOR REPAIR    . BUNIONECTOMY Bilateral   . EP IMPLANTABLE DEVICE N/A 10/12/2016   Procedure: Pacemaker Implant;  Surgeon: Will Jorja Loa, MD;  [facility];  Service: Cardiovascular;  Laterality: N/A;  . finfger surgery    . FINGER SURGERY    . LTCS    . MYRINGOTOMY    . ORIF ANKLE FRACTURE Right 07/25/2015   Procedure: OPEN  REDUCTION INTERNAL FIXATION (ORIF) RIGHT ANKLE ;  Surgeon: Sheral Apley, MD;  Location: [facility];  Service: Orthopedics;  Laterality: Right;  . PACEMAKER PLACEMENT    . RHINOPLASTY    . TONSILLECTOMY      Family History:  Family History  Problem Relation Age of Onset  . Stroke Mother   . Kidney disease Mother   . Lung cancer Father     stage IV   . Stomach cancer Maternal Grandfather   . Colon cancer Paternal Grandfather     Social History:  reports that she has been smoking Cigarettes.  She has a 15.00 pack-year smoking history. She has never used smokeless tobacco. She reports that she drinks alcohol. She reports that she does not use drugs.  Additional Social History:  Alcohol / Drug Use Pain Medications: Pt denies Prescriptions: Adderall, Zoloft, Xanax Over the Counter: Pt denies History of alcohol / drug use?: Yes Longest period of sobriety (when/how long): unknown Substance #1 Name of Substance 1: alcohol 1 - Age of First Use: unknown 1 - Amount (size/oz): unknown 1 - Frequency: daily 1 - Duration: ongoing 1 - Last Use / Amount: 02/03/17  CIWA: CIWA-Ar BP: 137/89 Pulse Rate: 120 COWS:    PATIENT STRENGTHS: (choose at least two) Average or above average  intelligence Communication skills  Allergies:  Allergies  Allergen Reactions  . Codeine Nausea And Vomiting  . Demerol [Meperidine] Other (See Comments)    Hallucinations     Home Medications:  (Not in a hospital admission)  OB/GYN Status:  No LMP recorded. Patient has had a hysterectomy.  General Assessment Data Location of Assessment: [facility] ED TTS Assessment: In system Is this a Tele or Face-to-Face Assessment?: Tele Assessment Is this an Initial Assessment or a Re-assessment for this encounter?: Initial Assessment Marital status: Divorced Bell name: Dack Is patient pregnant?: No Pregnancy Status: No Living Arrangements: Children Can pt return to current living arrangement?:  Yes Admission Status: Voluntary Is patient capable of signing voluntary admission?: Yes Referral Source: Self/Family/Friend Insurance type: [place]     Crisis Care Plan Living Arrangements: Children Legal Guardian: Other: (self) Name of Psychiatrist: [facility] Name of Therapist: [facility]  Education Status Is patient currently in school?: No Current Grade: NA Highest grade of school patient has completed: BA Name of school: NA Contact person: NA  Risk to self with the past 6 months Suicidal Ideation: No Has patient been a risk to self within the past 6 months prior to admission? : No Suicidal Intent: No Has patient had any suicidal intent within the past 6 months prior to admission? : No Is patient at risk for suicide?: Yes Suicidal Plan?: No Has patient had any suicidal plan within the past 6 months prior to admission? : No Access to Means: No What has been your use of drugs/alcohol within the last 12 months?: alcohol Previous Attempts/Gestures: No How many times?: 0 Other Self Harm Risks: NA Triggers for Past Attempts: None known Intentional Self Injurious Behavior: None Family Suicide History: No Recent stressful life event(s): Job Loss, Divorce, Loss (Comment), Conflict (Comment), Recent negative physical changes, Trauma (Comment), Turmoil (Comment) Persecutory voices/beliefs?: No Depression: Yes Depression Symptoms: Despondent, Insomnia, Tearfulness, Fatigue, Isolating, Guilt, Loss of interest in usual pleasures, Feeling worthless/self pity, Feeling angry/irritable Substance abuse history and/or treatment for substance abuse?: Yes Suicide prevention information given to non-admitted patients: Not applicable  Risk to Others within the past 6 months Homicidal Ideation: No Does patient have any lifetime risk of violence toward others beyond the six months prior to admission? : No Thoughts of Harm to Others: No Current Homicidal Intent: No Current  Homicidal Plan: No Access to Homicidal Means: No Identified Victim: NA History of harm to others?: No Assessment of Violence: None Noted Violent Behavior Description: NA Does patient have access to weapons?: No Criminal Charges Pending?: No Does patient have a court date: No Is patient on probation?: No  Psychosis Hallucinations: None noted Delusions: None noted  Mental Status Report Appearance/Hygiene: Unremarkable Eye Contact: Fair Motor Activity: Freedom of movement Speech: Logical/coherent Level of Consciousness: Alert Mood: Depressed, Sad Affect: Depressed, Sad Anxiety Level: Minimal Thought Processes: Coherent, Relevant Judgement: Unimpaired Orientation: Person, Place, Time, Situation, Appropriate for developmental age Obsessive Compulsive Thoughts/Behaviors: None  Cognitive Functioning Concentration: Normal Memory: Recent Intact, Remote Intact IQ: Average Insight: Poor Impulse Control: Poor Appetite: Poor Weight Loss: 0 Weight Gain: 0 Sleep: Decreased Total Hours of Sleep: 5 Vegetative Symptoms: None  ADLScreening [facility] Assessment Services) Patient's cognitive ability adequate to safely complete daily activities?: Yes Patient able to express need for assistance with ADLs?: Yes Independently performs ADLs?: Yes (appropriate for developmental age)  Prior Inpatient Therapy Prior Inpatient Therapy: Yes Prior Therapy Dates: 2014 Prior Therapy Facilty/Provider(s): [facility] Reason for Treatment: depression  Prior Outpatient Therapy Prior  Outpatient Therapy: Yes Prior Therapy Dates: current Prior Therapy Facilty/Provider(s):  [facility] Reason for Treatment: depression Does patient have an ACCT team?: No Does patient have Intensive In-House Services?  : No Does patient have [facility] services? : No Does patient have P4CC services?: No  ADL Screening (condition at time of admission) Patient's cognitive ability adequate to safely complete daily  activities?: Yes Is the patient deaf or have difficulty hearing?: No Does the patient have difficulty seeing, even when wearing glasses/contacts?: No Does the patient have difficulty concentrating, remembering, or making decisions?: No Patient able to express need for assistance with ADLs?: Yes Does the patient have difficulty dressing or bathing?: No Independently performs ADLs?: Yes (appropriate for developmental age) Does the patient have difficulty walking or climbing stairs?: No Weakness of Legs: None Weakness of Arms/Hands: None       Abuse/Neglect Assessment (Assessment to be complete while patient is alone) Physical Abuse: Denies Verbal Abuse: Denies Sexual Abuse: Denies Exploitation of patient/patient's resources: Denies Self-Neglect: Denies     Merchant navy officerAdvance Directives (For Healthcare) Does Patient Have a Medical Advance Directive?: No    Additional Information 1:1 In Past 12 Months?: No CIRT Risk: No Elopement Risk: No Does patient have medical clearance?: Yes     Disposition:  Disposition Initial Assessment Completed for this Encounter: Yes Disposition of Patient: Inpatient treatment program Type of inpatient treatment program: Adult  Emmit PomfretLevette,[place] Stivers D 02/03/2017 2:32 PM

## 2017-02-03 NOTE — ED Notes (Signed)
Pt given cordless fan for comfort

## 2017-02-04 ENCOUNTER — Encounter (HOSPITAL_COMMUNITY): Payer: Self-pay

## 2017-02-04 ENCOUNTER — Inpatient Hospital Stay (HOSPITAL_COMMUNITY)
Admission: AD | Admit: 2017-02-04 | Discharge: 2017-02-09 | DRG: 885 | Disposition: A | Discharge status: Home / Self Care | Payer: Medicaid Other | Source: Intra-hospital | Attending: Emergency Medicine | Admitting: Emergency Medicine

## 2017-02-04 DIAGNOSIS — I442 Atrioventricular block, complete: Secondary | ICD-10-CM | POA: Diagnosis not present

## 2017-02-04 DIAGNOSIS — F1020 Alcohol dependence, uncomplicated: Secondary | ICD-10-CM | POA: Diagnosis present

## 2017-02-04 DIAGNOSIS — Z800 Family history of malignant neoplasm of digestive organs: Secondary | ICD-10-CM

## 2017-02-04 DIAGNOSIS — Z950 Presence of cardiac pacemaker: Secondary | ICD-10-CM | POA: Diagnosis not present

## 2017-02-04 DIAGNOSIS — E785 Hyperlipidemia, unspecified: Secondary | ICD-10-CM | POA: Diagnosis present

## 2017-02-04 DIAGNOSIS — M1990 Unspecified osteoarthritis, unspecified site: Secondary | ICD-10-CM | POA: Diagnosis present

## 2017-02-04 DIAGNOSIS — I959 Hypotension, unspecified: Secondary | ICD-10-CM | POA: Diagnosis present

## 2017-02-04 DIAGNOSIS — F429 Obsessive-compulsive disorder, unspecified: Secondary | ICD-10-CM | POA: Diagnosis present

## 2017-02-04 DIAGNOSIS — M549 Dorsalgia, unspecified: Secondary | ICD-10-CM | POA: Diagnosis present

## 2017-02-04 DIAGNOSIS — E119 Type 2 diabetes mellitus without complications: Secondary | ICD-10-CM | POA: Diagnosis present

## 2017-02-04 DIAGNOSIS — R45851 Suicidal ideations: Secondary | ICD-10-CM | POA: Diagnosis present

## 2017-02-04 DIAGNOSIS — F4310 Post-traumatic stress disorder, unspecified: Secondary | ICD-10-CM | POA: Diagnosis present

## 2017-02-04 DIAGNOSIS — Z841 Family history of disorders of kidney and ureter: Secondary | ICD-10-CM

## 2017-02-04 DIAGNOSIS — Z885 Allergy status to narcotic agent status: Secondary | ICD-10-CM | POA: Diagnosis not present

## 2017-02-04 DIAGNOSIS — Z801 Family history of malignant neoplasm of trachea, bronchus and lung: Secondary | ICD-10-CM

## 2017-02-04 DIAGNOSIS — Z90710 Acquired absence of both cervix and uterus: Secondary | ICD-10-CM

## 2017-02-04 DIAGNOSIS — I10 Essential (primary) hypertension: Secondary | ICD-10-CM | POA: Diagnosis present

## 2017-02-04 DIAGNOSIS — Z823 Family history of stroke: Secondary | ICD-10-CM

## 2017-02-04 DIAGNOSIS — K760 Fatty (change of) liver, not elsewhere classified: Secondary | ICD-10-CM | POA: Diagnosis present

## 2017-02-04 DIAGNOSIS — Z7982 Long term (current) use of aspirin: Secondary | ICD-10-CM

## 2017-02-04 DIAGNOSIS — F17210 Nicotine dependence, cigarettes, uncomplicated: Secondary | ICD-10-CM | POA: Diagnosis present

## 2017-02-04 DIAGNOSIS — Z720 Tobacco use: Secondary | ICD-10-CM | POA: Diagnosis present

## 2017-02-04 DIAGNOSIS — R160 Hepatomegaly, not elsewhere classified: Secondary | ICD-10-CM | POA: Diagnosis present

## 2017-02-04 DIAGNOSIS — G8929 Other chronic pain: Secondary | ICD-10-CM | POA: Diagnosis present

## 2017-02-04 DIAGNOSIS — E1165 Type 2 diabetes mellitus with hyperglycemia: Secondary | ICD-10-CM

## 2017-02-04 DIAGNOSIS — F909 Attention-deficit hyperactivity disorder, unspecified type: Secondary | ICD-10-CM | POA: Diagnosis present

## 2017-02-04 DIAGNOSIS — R002 Palpitations: Secondary | ICD-10-CM | POA: Diagnosis not present

## 2017-02-04 DIAGNOSIS — E876 Hypokalemia: Secondary | ICD-10-CM | POA: Diagnosis present

## 2017-02-04 DIAGNOSIS — F332 Major depressive disorder, recurrent severe without psychotic features: Secondary | ICD-10-CM | POA: Diagnosis present

## 2017-02-04 DIAGNOSIS — Z79899 Other long term (current) drug therapy: Secondary | ICD-10-CM | POA: Diagnosis not present

## 2017-02-04 DIAGNOSIS — F1010 Alcohol abuse, uncomplicated: Secondary | ICD-10-CM | POA: Diagnosis not present

## 2017-02-04 MED ORDER — HYDROXYZINE HCL 25 MG PO TABS
25.0000 mg | ORAL_TABLET | Freq: Four times a day (QID) | ORAL | Status: DC | PRN
  Filled 2017-02-04: qty 1

## 2017-02-04 MED ORDER — FOLIC ACID 1 MG PO TABS
1.0000 mg | ORAL_TABLET | Freq: Every day | ORAL | Status: DC
  Administered 2017-02-05 – 2017-02-09 (×5): 1 mg via ORAL
  Filled 2017-02-04 (×7): qty 1

## 2017-02-04 MED ORDER — ASPIRIN EC 81 MG PO TBEC
81.0000 mg | DELAYED_RELEASE_TABLET | Freq: Every day | ORAL | Status: DC
  Administered 2017-02-05 – 2017-02-09 (×5): 81 mg via ORAL
  Filled 2017-02-04 (×8): qty 1

## 2017-02-04 MED ORDER — ATORVASTATIN CALCIUM 40 MG PO TABS
40.0000 mg | ORAL_TABLET | Freq: Every day | ORAL | Status: DC
  Administered 2017-02-06 – 2017-02-08 (×3): 40 mg via ORAL
  Filled 2017-02-04 (×6): qty 1

## 2017-02-04 MED ORDER — VITAMIN B-1 100 MG PO TABS
100.0000 mg | ORAL_TABLET | Freq: Every day | ORAL | Status: DC
  Administered 2017-02-05 – 2017-02-09 (×5): 100 mg via ORAL
  Filled 2017-02-04 (×8): qty 1

## 2017-02-04 MED ORDER — ADULT MULTIVITAMIN W/MINERALS CH
1.0000 | ORAL_TABLET | Freq: Every day | ORAL | Status: DC
  Administered 2017-02-05 – 2017-02-09 (×5): 1 via ORAL
  Filled 2017-02-04 (×8): qty 1

## 2017-02-04 MED ORDER — LORAZEPAM 1 MG PO TABS
1.0000 mg | ORAL_TABLET | Freq: Two times a day (BID) | ORAL | Status: AC
  Administered 2017-02-06 – 2017-02-07 (×2): 1 mg via ORAL
  Filled 2017-02-04 (×2): qty 1

## 2017-02-04 MED ORDER — DILTIAZEM HCL ER COATED BEADS 120 MG PO CP24
120.0000 mg | ORAL_CAPSULE | Freq: Every day | ORAL | Status: DC
  Administered 2017-02-05 – 2017-02-09 (×5): 120 mg via ORAL
  Filled 2017-02-04 (×8): qty 1

## 2017-02-04 MED ORDER — ALPRAZOLAM 0.25 MG PO TABS
0.2500 mg | ORAL_TABLET | Freq: Every day | ORAL | Status: DC | PRN
  Administered 2017-02-04 – 2017-02-05 (×2): 0.25 mg via ORAL
  Filled 2017-02-04 (×2): qty 1

## 2017-02-04 MED ORDER — ONDANSETRON 4 MG PO TBDP
4.0000 mg | ORAL_TABLET | Freq: Four times a day (QID) | ORAL | Status: AC | PRN
  Administered 2017-02-07: 4 mg via ORAL
  Filled 2017-02-04: qty 1

## 2017-02-04 MED ORDER — LORAZEPAM 1 MG PO TABS
1.0000 mg | ORAL_TABLET | Freq: Four times a day (QID) | ORAL | Status: DC | PRN
  Administered 2017-02-06 – 2017-02-07 (×2): 1 mg via ORAL
  Filled 2017-02-04 (×2): qty 1

## 2017-02-04 MED ORDER — HYDROXYZINE HCL 25 MG PO TABS
25.0000 mg | ORAL_TABLET | Freq: Four times a day (QID) | ORAL | Status: DC | PRN
  Administered 2017-02-06 – 2017-02-08 (×4): 25 mg via ORAL
  Filled 2017-02-04 (×3): qty 1

## 2017-02-04 MED ORDER — AMPHETAMINE-DEXTROAMPHETAMINE 10 MG PO TABS
20.0000 mg | ORAL_TABLET | Freq: Three times a day (TID) | ORAL | Status: DC
  Administered 2017-02-05: 20 mg via ORAL
  Filled 2017-02-04: qty 2

## 2017-02-04 MED ORDER — ACETAMINOPHEN 325 MG PO TABS
650.0000 mg | ORAL_TABLET | Freq: Four times a day (QID) | ORAL | Status: DC | PRN
  Administered 2017-02-06 – 2017-02-09 (×9): 650 mg via ORAL
  Filled 2017-02-04 (×9): qty 2

## 2017-02-04 MED ORDER — MAGNESIUM HYDROXIDE 400 MG/5ML PO SUSP: 30.0000 mL | Freq: Every day | ORAL | Status: DC | PRN

## 2017-02-04 MED ORDER — ALUM & MAG HYDROXIDE-SIMETH 200-200-20 MG/5ML PO SUSP: 30.0000 mL | ORAL | Status: DC | PRN

## 2017-02-04 MED ORDER — TRAZODONE HCL 100 MG PO TABS
100.0000 mg | ORAL_TABLET | Freq: Every evening | ORAL | Status: DC | PRN
  Administered 2017-02-04: 150 mg via ORAL
  Administered 2017-02-05 – 2017-02-06 (×2): 100 mg via ORAL
  Filled 2017-02-04: qty 1.5
  Filled 2017-02-04: qty 1
  Filled 2017-02-04: qty 2
  Filled 2017-02-04: qty 1

## 2017-02-04 MED ORDER — THIAMINE HCL 100 MG/ML IJ SOLN: 100.0000 mg | Freq: Once | INTRAMUSCULAR | Status: DC

## 2017-02-04 MED ORDER — LORAZEPAM 1 MG PO TABS
1.0000 mg | ORAL_TABLET | Freq: Four times a day (QID) | ORAL | Status: AC
  Administered 2017-02-04 – 2017-02-05 (×2): 1 mg via ORAL
  Filled 2017-02-04 (×2): qty 1

## 2017-02-04 MED ORDER — LORAZEPAM 1 MG PO TABS
1.0000 mg | ORAL_TABLET | Freq: Three times a day (TID) | ORAL | Status: AC
  Administered 2017-02-06 (×2): 1 mg via ORAL
  Filled 2017-02-04 (×2): qty 1

## 2017-02-04 MED ORDER — SERTRALINE HCL 50 MG PO TABS
150.0000 mg | ORAL_TABLET | Freq: Every day | ORAL | Status: DC
  Administered 2017-02-05 – 2017-02-07 (×3): 150 mg via ORAL
  Filled 2017-02-04 (×7): qty 1

## 2017-02-04 MED ORDER — LOSARTAN POTASSIUM 50 MG PO TABS
100.0000 mg | ORAL_TABLET | Freq: Every day | ORAL | Status: DC
  Administered 2017-02-05 – 2017-02-09 (×5): 100 mg via ORAL
  Filled 2017-02-04 (×9): qty 2

## 2017-02-04 MED ORDER — LOPERAMIDE HCL 2 MG PO CAPS: 2.0000 mg | ORAL_CAPSULE | ORAL | Status: AC | PRN

## 2017-02-04 MED ORDER — LORAZEPAM 1 MG PO TABS
1.0000 mg | ORAL_TABLET | Freq: Every day | ORAL | Status: AC
  Administered 2017-02-08: 1 mg via ORAL
  Filled 2017-02-04: qty 1

## 2017-02-04 MED ORDER — BREXPIPRAZOLE 1 MG PO TABS
0.5000 mg | ORAL_TABLET | Freq: Every day | ORAL | Status: DC
  Filled 2017-02-04 (×3): qty 1

## 2017-02-04 NOTE — ED Notes (Addendum)
Patient is currently upset about being separated from family. She is tearful. This RN discussed the patients concerns and she [facility]

## 2017-02-04 NOTE — ED Notes (Signed)
Pt sitting up eating breakfast states is ready to go, has IV  Rt wrist

## 2017-02-04 NOTE — ED Notes (Addendum)
Snacks given 

## 2017-02-04 NOTE — Plan of Care (Signed)
Problem: Activity: Goal: Sleeping patterns will improve Outcome: Progressing Patient is resting in bed with her eyes closed. Respirations are even and unlabored. Will continue to monitor.  Problem: Safety: Goal: Periods of time without injury will increase Outcome: Progressing Patient contracts for safety; patient is on q15 min safety checks. Patient is safe on the unit at this time.

## 2017-02-04 NOTE — ED Notes (Signed)
[facility]. Patient had her belongings. [facility] transporter had Security Envelope and Bag of Medications that I retrieved from pharmacy.

## 2017-02-04 NOTE — Progress Notes (Signed)
Patient ID: Brittney MusterMaria C Cooper, female   DOB: 04/23/1961, 56 y.o.   MRN: [id number] Patient was admitted to the unit due to increased depression and passive death wish, but not suicidal ideations.  Patient reports feeling increasingly depressed over the past month and reports increase in alcohol consumption secondary to depression.  Patient feels that she would like to get a handle on her depression and drinking. Patient reports several stressors that include the decline in her parent's health and a change in her own health. Patient was oriented to the unit without incident.

## 2017-02-04 NOTE — Progress Notes (Signed)
Nursing Progress Note 7p-7a  D) Patient presents pleasant and cooperative. Patient appears depressed. Patient states "I need to get my medications changed. I just don't think the Zoloft is working anymore". Patient states she is eager to receive help but "anxious to go home, I have to take care of my children and my parents". Patient reports missing her dog. Patient reports passive SI and denies HI/AVH or pain. Patient contracts for safety at this time. Patient complains of mild chronic palpitations but denies shortness of breath or chest pain. Patient is tachycardic but denies feeling symptomatic. Patient requests to take her adderall at 6:30am to "help me wake up". Patient states "that is how I take it at home". Patient requests medications for sleep and anxiety.  A) Emotional support given. Patient medicated with PM orders and PRN orders as prescribed. Medications reviewed with patient. Patient on q15 min safety checks. Opportunities for questions or concerns presented to patient. Patient encouraged to continue to work on treatment goals. Patient provided fully charged [employer]enger Smart while sleeping to synchronize with pacemaker.   R) Patient receptive to interaction with nurse. Patient remains safe on the unit at this time. Patient is resting in bed without complaints. Will continue to monitor.

## 2017-02-04 NOTE — ED Notes (Signed)
Lunch order placed

## 2017-02-04 NOTE — Tx Team (Addendum)
Initial Treatment Plan 02/04/2017 7:00 PM Brittney Cooper [id number]:[id number]    PATIENT STRESSORS: Financial difficulties Health problems Marital or family conflict   PATIENT STRENGTHS: Capable of independent living Manufacturing systems engineerinancial means  Communication skills General knowledge Motivation for growth   PATIENT IDENTIFIED PROBLEMS: "Increased depression"  "Increased alcohol consumption"  Passive death wish   "medication changes"  "feel better"             DISCHARGE CRITERIA:  Improved stabilization in mood, thinking, and/or behavior  PRELIMINARY DISCHARGE PLAN: Return to previous living arrangement  PATIENT/FAMILY INVOLVEMENT: This treatment plan has been presented to and reviewed with the patient, Brittney Cooper.The patient has been given the opportunity to ask questions and make suggestions.  Jerrye BushyLaRonica R Waller, RN 02/04/2017, 7:00 PM

## 2017-02-04 NOTE — ED Notes (Signed)
Patient is much calmer now. Pulse rate has decreased.

## 2017-02-04 NOTE — ED Notes (Signed)
[facility]. Contacted [facility] for [employer].

## 2017-02-04 NOTE — ED Notes (Signed)
Pt given sprite per Cynthia(RN)

## 2017-02-04 NOTE — Progress Notes (Signed)
[facility] by Dr. Lucianne MussKumar, report can be called to [phone]. Notified [facility]. Pt can arrive anytime.  Ilean SkillMeghan Vennessa Affinito, MSW, LCSW Clinical Social Work, Disposition  02/04/2017 [phone]

## 2017-02-04 NOTE — ED Notes (Signed)
Dinner order placed 

## 2017-02-04 NOTE — ED Notes (Signed)
Up to shower

## 2017-02-04 NOTE — ED Notes (Signed)
[facility] told pt did not want to go until she spoke someone

## 2017-02-04 NOTE — ED Notes (Signed)
Transported pt to shower, no issues. Pt back in room.

## 2017-02-04 NOTE — ED Notes (Signed)
[facility] states she is not going without talking to counselor

## 2017-02-04 NOTE — Progress Notes (Signed)
Patient requesting to go to sleep. Biotronik Scientist, clinical (histocompatibility and immunogenetics)Cardiomessenger Smart device provided to patient. Charger placed in medication room with patient label. Patient instructed to return device in the morning.

## 2017-02-05 ENCOUNTER — Encounter (HOSPITAL_COMMUNITY): Payer: Self-pay | Admitting: Emergency Medicine

## 2017-02-05 ENCOUNTER — Inpatient Hospital Stay (HOSPITAL_COMMUNITY): Payer: Medicaid Other

## 2017-02-05 LAB — BASIC METABOLIC PANEL
Anion gap: 12 (ref 5–15)
BUN: 12 mg/dL (ref 6–20)
CHLORIDE: 98 mmol/L — AB (ref 101–111)
CO2: 23 mmol/L (ref 22–32)
Calcium: 8.7 mg/dL — ABNORMAL LOW (ref 8.9–10.3)
Creatinine, Ser: 0.95 mg/dL (ref 0.44–1.00)
GFR calc non Af Amer: >60 mL/min (ref >60)
Glucose, Bld: 323 mg/dL — ABNORMAL HIGH (ref 65–99)
POTASSIUM: 3.3 mmol/L — AB (ref 3.5–5.1)
Sodium: 133 mmol/L — ABNORMAL LOW (ref 135–145)

## 2017-02-05 LAB — GLUCOSE, CAPILLARY: Glucose-Capillary: 190 mg/dL — ABNORMAL HIGH (ref 65–99)

## 2017-02-05 LAB — CBC
HEMATOCRIT: 38.8 % (ref 36.0–46.0)
Hemoglobin: 13.1 g/dL (ref 12.0–15.0)
MCH: 33.2 pg (ref 26.0–34.0)
MCHC: 33.8 g/dL (ref 30.0–36.0)
MCV: 98.5 fL (ref 78.0–100.0)
Platelets: 166 10*3/uL (ref 150–400)
RBC: 3.94 MIL/uL (ref 3.87–5.11)
RDW: 14.8 % (ref 11.5–15.5)
WBC: 6.6 10*3/uL (ref 4.0–10.5)

## 2017-02-05 LAB — I-STAT TROPONIN, ED: Troponin i, poc: 0 ng/mL (ref 0.00–0.08)

## 2017-02-05 MED ORDER — POTASSIUM CHLORIDE CRYS ER 20 MEQ PO TBCR
20.0000 meq | EXTENDED_RELEASE_TABLET | Freq: Once | ORAL | Status: AC
  Administered 2017-02-05: 20 meq via ORAL
  Filled 2017-02-05 (×2): qty 1

## 2017-02-05 MED ORDER — NICOTINE 21 MG/24HR TD PT24
MEDICATED_PATCH | TRANSDERMAL | Status: AC
  Filled 2017-02-05: qty 1

## 2017-02-05 MED ORDER — GUAIFENESIN 100 MG/5ML PO SOLN
10.0000 mL | ORAL | Status: DC | PRN
  Administered 2017-02-05 – 2017-02-09 (×10): 200 mg via ORAL
  Filled 2017-02-05 (×10): qty 10

## 2017-02-05 MED ORDER — NICOTINE 21 MG/24HR TD PT24
21.0000 mg | MEDICATED_PATCH | Freq: Every day | TRANSDERMAL | Status: DC
  Administered 2017-02-05 – 2017-02-09 (×4): 21 mg via TRANSDERMAL
  Filled 2017-02-05 (×7): qty 1

## 2017-02-05 MED ORDER — SODIUM CHLORIDE 0.9 % IV BOLUS (SEPSIS)
1000.0000 mL | Freq: Once | INTRAVENOUS | Status: AC
  Administered 2017-02-05: 1000 mL via INTRAVENOUS

## 2017-02-05 MED ORDER — ARIPIPRAZOLE 5 MG PO TABS
5.0000 mg | ORAL_TABLET | Freq: Every day | ORAL | Status: DC
  Administered 2017-02-05 – 2017-02-09 (×5): 5 mg via ORAL
  Filled 2017-02-05 (×7): qty 1

## 2017-02-05 NOTE — ED Triage Notes (Signed)
Pt here with extensive medical hx, states she has a pacemaker due to intermittent complete heart block, states three weeks ago she started drinking and [facility]. Pt states she hasn't drank in 3 days, states she was sent here by NP for Tachycardia and hypotension, pt also states she stood up and fell over, denies hitting head. Pt in NAD. Pt c/o bilateral knee pain. Pt Tachy in 120s in triage.

## 2017-02-05 NOTE — ED Provider Notes (Signed)
[facility]-EM[facility]Y DEPT Provider Note   CSN: [id number] Arrival date & time: 02/05/17  1336    History   Chief Complaint Chief Complaint  Patient presents with  . FRom [facility]  . Hypotension  . Fall    HPI Brittney Cooper is a 56 y.o. female.  HPI   56 year old female presents today from behavioral health with hypotension. Patient has history of alcohol abuse, bipolar disorder, OCD, PTSD, complete heart block status post pacemaker placement, hypertension, hyperlipidemia. Patient was brought to the emergency room on 02/03/2017 with palpitations and alcohol intoxication. Patient had workup including EKG, troponin, d-dimer with subsequent CT angiogram chest (negative). Patient notes that she was taken to behavioral health after medically cleared, notes feeling better today. She reports several hours prior to evaluation here she was feeling slightly weak and "strange". She reports that when walking she did not feel well. She denied any specific chest pain, palpitations, shortness of breath. Patient was noted to be hypotensive with a blood pressure In the systolic 80 range. Patient was brought for further evaluation.   Past Medical History:  Diagnosis Date  . ADHD (attention deficit hyperactivity disorder)   . 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 Anxiety   . Arthritis   . Bipolar disorder (HCC)   . Chronic pain    back  . Depression   . Fatty liver   . Fracture of lateral malleolus of right fibula   . Hepatomegaly   . Hypertension   . OCD (obsessive compulsive disorder)   . Pancreatic pseudocyst   . Panic attack   . 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 Syncope    neurocardiogenic    Patient Active Problem List   Diagnosis Date Noted  . MDD (major depressive disorder), recurrent severe, 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  . Complete heart block (HCC)   . Palpitation 10/11/2016  . Palpitations  10/11/2016  . Acute pancreatitis 09/15/2016  . Alcohol abuse 09/15/2016  . ADHD (attention deficit hyperactivity disorder) 09/15/2016  . Tobacco use 09/15/2016  . Anxiety 09/15/2016  . Hypokalemia 09/15/2016  . Pancreatitis 07/09/2016  . Alcoholic pancreatitis 07/08/2016  . Alcohol withdrawal (HCC) 07/08/2016  . Bipolar disorder (HCC) 07/08/2016  . Essential hypertension 07/08/2016  . Left shoulder pain 09/09/2014    Past Surgical History:  Procedure Laterality Date  . ABDOMINAL HYSTERECTOMY    . ANTERIOR AND POSTERIOR REPAIR    . BUNIONECTOMY Bilateral   . EP IMPLANTABLE DEVICE N/A 10/12/2016   Procedure: Pacemaker Implant;  Surgeon: Will Jorja Loa, MD;  [facility]: [facility];  Service: Cardiovascular;  Laterality: N/A;  . finfger surgery    . FINGER SURGERY    . LTCS    . MYRINGOTOMY    . ORIF ANKLE FRACTURE Right 07/25/2015   Procedure: OPEN REDUCTION INTERNAL FIXATION (ORIF) RIGHT ANKLE ;  Surgeon: Sheral Apley, MD;  Location: [facility];  Service: Orthopedics;  Laterality: Right;  . PACEMAKER PLACEMENT    . RHINOPLASTY    . TONSILLECTOMY      OB History    No data available       Home Medications    Prior to Admission medications   Medication Sig Start Date End Date Taking? Authorizing Provider  acetaminophen (TYLENOL) 500 MG tablet Take 1,000 mg by mouth every 6 (six) hours as needed for mild pain.    Historical Provider, MD  ALPRAZolam (  XANAX) 0.5 MG tablet Take 0.25 mg by mouth 5 (five) times daily as needed for anxiety.     Historical Provider, MD  amphetamine-dextroamphetamine (ADDERALL) 20 MG tablet Take 20 mg by mouth 3 (three) times daily.    Historical Provider, MD  aspirin EC 81 MG tablet Take 81 mg by mouth daily.    Historical Provider, MD  atorvastatin (LIPITOR) 40 MG tablet Take 40 mg by mouth daily at 6 PM.     Historical Provider, MD  Brexpiprazole (REXULTI) 0.5 MG TABS Take 0.5 mg by mouth daily.    Historical  Provider, MD  Brexpiprazole (REXULTI) 1 MG TABS Take 1 mg by mouth daily.    Historical Provider, MD  diltiazem (CARDIZEM CD) 120 MG 24 hr capsule Take 1 capsule (120 mg total) by mouth daily. 01/11/17 04/11/17  Duke Salvia, MD  folic acid (FOLVITE) 1 MG tablet Take 1 tablet (1 mg total) by mouth daily. 09/19/16   Mir [name], MD  losartan (COZAAR) 100 MG tablet Take 100 mg by mouth daily.  06/12/14   Historical Provider, MD  Multiple Vitamin (MULTIVITAMIN WITH MINERALS) TABS tablet Take 1 tablet by mouth daily. 09/19/16   Mir [name], MD  sertraline (ZOLOFT) 100 MG tablet Take 150 mg by mouth daily.     Historical Provider, MD  thiamine 100 MG tablet Take 1 tablet (100 mg total) by mouth daily. 09/19/16   Mir [name], MD  traZODone (DESYREL) 50 MG tablet Take 100-150 mg by mouth at bedtime as needed for sleep. For sleep. 06/30/16   Historical Provider, MD    Family History Family History  Problem Relation Age of Onset  . Stroke Mother   . Kidney disease Mother   . Lung cancer Father     stage IV   . Stomach cancer Maternal Grandfather   . Colon cancer Paternal Grandfather     Social History Social History  Substance Use Topics  . Smoking status: Current Every Day Smoker    Packs/day: 1.00    Years: 15.00    Types: Cigarettes  . Smokeless tobacco: Never Used  . Alcohol use 0.0 oz/week     Comment: vodka daily      Allergies   Codeine and Demerol [meperidine]   Review of Systems Review of Systems  All other systems reviewed and are negative.    Physical Exam Updated Vital Signs BP 116/78   Pulse 98   Temp 98.6 F (37 C)   Resp 18   Ht 5\' 4"  (1.626 m)   Wt 88 kg   SpO2 97%   BMI 33.30 kg/m   Physical Exam  Constitutional: She is oriented to person, place, and time. She appears well-developed and well-nourished.  HENT:  Head: Normocephalic and atraumatic.  Eyes: Conjunctivae are normal. Pupils are equal, round, and reactive to  light. Right eye exhibits no discharge. Left eye exhibits no discharge. No scleral icterus.  Neck: Normal range of motion. No JVD present. No tracheal deviation present.  Cardiovascular: Regular rhythm.   Pulmonary/Chest: Breath sounds normal. No stridor. No respiratory distress. She has no wheezes. She has no rales. She exhibits no tenderness.  Abdominal: Soft.  Neurological: She is alert and oriented to person, place, and time. No cranial nerve deficit. Coordination normal.  Skin: Skin is warm.  Psychiatric: She has a normal mood and affect. Her behavior is normal. Judgment and thought content normal.  Nursing note and vitals reviewed.   ED Treatments /  Results  Labs (all labs ordered are listed, but only abnormal results are displayed) Labs Reviewed  BASIC METABOLIC PANEL - Abnormal; Notable for the following:       Result Value   Sodium 133 (*)    Potassium 3.3 (*)    Chloride 98 (*)    Glucose, Bld 323 (*)    Calcium 8.7 (*)    All other components within normal limits  CBC  I-STAT TROPOININ, ED    EKG  EKG Interpretation None       Radiology Dg Chest 2 View  Result Date: 02/05/2017 CLINICAL DATA:  Shortness of breath and cough EXAM: CHEST  2 VIEW COMPARISON:  02/03/2017 FINDINGS: The heart size and mediastinal contours are within normal limits. Both lungs are clear. The visualized skeletal structures are unremarkable. Pacing device is again noted and stable. IMPRESSION: No active cardiopulmonary disease. Electronically Signed   By: Alcide Clever M.D.   On: 02/05/2017 14:39    Procedures Procedures (including critical care time)  Medications Ordered in ED Medications  hydrOXYzine (ATARAX/VISTARIL) tablet 25 mg (not administered)  loperamide (IMODIUM) capsule 2-4 mg (not administered)  LORazepam (ATIVAN) tablet 1 mg (not administered)  LORazepam (ATIVAN) tablet 1 mg (0 mg Oral Hold 02/05/17 1200)    Followed by  LORazepam (ATIVAN) tablet 1 mg (not administered)     Followed by  LORazepam (ATIVAN) tablet 1 mg (not administered)    Followed by  LORazepam (ATIVAN) tablet 1 mg (not administered)  multivitamin with minerals tablet 1 tablet (1 tablet Oral Given 02/05/17 0800)  ondansetron (ZOFRAN-ODT) disintegrating tablet 4 mg (not administered)  thiamine (VITAMIN B-1) tablet 100 mg (100 mg Oral Given 02/05/17 0854)  acetaminophen (TYLENOL) tablet 650 mg (not administered)  alum & mag hydroxide-simeth (MAALOX/MYLANTA) 200-200-20 MG/5ML suspension 30 mL (not administered)  hydrOXYzine (ATARAX/VISTARIL) tablet 25 mg (not administered)  magnesium hydroxide (MILK OF MAGNESIA) suspension 30 mL (not administered)  aspirin EC tablet 81 mg (81 mg Oral Given 02/05/17 0854)  atorvastatin (LIPITOR) tablet 40 mg (not administered)  diltiazem (CARDIZEM CD) 24 hr capsule 120 mg (120 mg Oral Given 02/05/17 0854)  folic acid (FOLVITE) tablet 1 mg (1 mg Oral Given 02/05/17 0853)  losartan (COZAAR) tablet 100 mg (100 mg Oral Given 02/05/17 0854)  sertraline (ZOLOFT) tablet 150 mg (150 mg Oral Given 02/05/17 0854)  traZODone (DESYREL) tablet 100-150 mg (150 mg Oral Given 02/04/17 2126)  nicotine (NICODERM CQ - dosed in mg/24 hours) patch 21 mg (21 mg Transdermal Patch Applied 02/05/17 0620)  nicotine (NICODERM CQ - dosed in mg/24 hours) 21 mg/24hr patch (  Override Pull 02/05/17 0620)  ARIPiprazole (ABILIFY) tablet 5 mg (5 mg Oral Given 02/05/17 1732)  sodium chloride 0.9 % bolus 1,000 mL (0 mLs Intravenous Stopped 02/05/17 1737)     Initial Impression / Assessment and Plan / ED Course  I have reviewed the triage vital signs and the nursing notes.  Pertinent labs & imaging results that were available during my care of the patient were reviewed by me and considered in my medical decision making (see chart for details).      Final Clinical Impressions(s) / ED Diagnoses   Final diagnoses:  Hypotension, unspecified hypotension type    Labs:  BMP, CBC, trop,   Imaging: DG  Chest, ED EKG  Consults:   Therapeutics: Normal saline  Discharge Meds:   Assessment/Plan:56 year old female presents today with hypertension. While in the ED patient has no episodes of hypotension, lowest reading here  in the low 90s systolic. Patient was given fluids here, tolerating by mouth. Her blood pressure returned to normal rate, pulse rate went down. Patient feeling well ambulating up and down the halls without difficulty. Patient likely dehydrated. Patient is having no chest pain, dizziness, headache or lightheadedness. Patient has no concerning signs or symptoms that would necessitate further evaluation or management here in the ED. She'll be discharged back to behavioral health with strict cautions. She verbalized understanding and agreement to today's plan had no further questions or concerns    New Prescriptions Current Discharge Medication List       Eyvonne MechanicJeffrey Jolynne Spurgin, PA-C 02/05/17 2028    Mancel BaleElliott Wentz, MD 02/06/17 [phone]

## 2017-02-05 NOTE — Discharge Instructions (Signed)
Please read attached information. If you experience any new or worsening signs or symptoms please return to the emergency room for evaluation. Please follow-up with your primary care provider or specialist as discussed.  °

## 2017-02-05 NOTE — [facility] Group Notes (Signed)
Adult Therapy Group Note (Clinical Social Work)  Date:  02/05/2017  Time:  10:00-11:00AM  Group Topic/Focus: Fears and Healthy/Unhealthy Coping Skills  Building Self Esteem:   The Focus of this group was to discuss some of the prevalent fears that patients experience, and to identify the commonalities among group members.  An exercise was used to initiate the discussion, followed by writing on the white board a group-generated list of unhealthy coping and healthy coping techniques to deal with each fear, as well as supports that could help in using healthy coping.  This included a variety of supports, and CSW emphasized professional supports such as therapist, support groups and psychiatrist.  Radical acceptance was discussed briefly.  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  She would like to learn even more.  Ambrose MantleMareida Grossman-Orr, LCSW 02/05/2017   12:35pm

## 2017-02-05 NOTE — ED Notes (Signed)
Patient transported to X-ray 

## 2017-02-05 NOTE — Progress Notes (Signed)
Pt did not attend group. 

## 2017-02-05 NOTE — Progress Notes (Addendum)
Patient dizzy, unsteady on feet. Patient remains tachycardic and BP low at this time (manually). Met with patient and NP. Med changes made and acknowledged. High fall precautions initiated and reviewed. Patient verbalized understanding however on recheck of patient in 15 mins, patient found on knees and hands stating she fell upon throwing trash away. Denies injury stating she was able to "catch herself". NP, AC, charge made aware of fall. Order received to transfer to ED for eval with [facility]. Patient verbalizes understanding. Report called to ED charge RN. Call made to emergency contact, sister, Manuela Schwartz.

## 2017-02-05 NOTE — H&P (Signed)
Psychiatric Admission Assessment Adult  Patient Identification: Brittney Cooper MRN:  [id number] Date of Evaluation:  02/05/2017 Chief Complaint:  MDD RECURRENT SEVERE ALCOHOL USE DISORDER Principal Diagnosis: MDD (major depressive disorder), recurrent severe, without psychosis (HCC) Diagnosis:   Patient Active Problem List   Diagnosis Date Noted  . MDD (major depressive disorder), recurrent severe, without psychosis (HCC) [F33.2] 02/04/2017    Priority: High  . Complete heart block (HCC) [I44.2]   . Palpitation [R00.2] 10/11/2016  . Palpitations [R00.2] 10/11/2016  . Acute pancreatitis [K85.90] 09/15/2016  . Alcohol abuse [F10.10] 09/15/2016  . ADHD (attention deficit hyperactivity disorder) [F90.9] 09/15/2016  . Tobacco use [Z72.0] 09/15/2016  . Anxiety [F41.9] 09/15/2016  . Hypokalemia [E87.6] 09/15/2016  . Pancreatitis [K85.90] 07/09/2016  . Alcoholic pancreatitis [K85.20] 07/08/2016  . Alcohol withdrawal (HCC) [F10.239] 07/08/2016  . Bipolar disorder (HCC) [F31.9] 07/08/2016  . Essential hypertension [I10] 07/08/2016  . Left shoulder pain [M25.512] 09/09/2014   History of Present Illness: Brittney Cooper is an 56 y.o. female who upon admission upon ED was on a 2 week self neglect with ADL's and hygiene.  Furthermore, had thoughts of suicide, "not wanting to be alive."  Pt denies HI and AVH. Pt reports the following stressors: recent heart sugrery, father has stage IV cancer, mother is sick, and work issues.   Patient states she was currently on a medical leave from work due to her depression but has returned to work.  Patient states she has a therapist and psychiatrist and sees them on a regular basis, Q2 months. Patient states she is currently prescribed Xanax, Zoloft, and Adderall.  Patient is symptomatic and fell.  Hypotensive at 88/68.  She has significant cardiac history, hx of syncopal episodes and was eventually diagnosed with total heart block, a pacer was placed.  Between  this recent major health change, caring for elderly parents, patient felt helpless.    Associated Signs/Symptoms: Depression Symptoms:  depressed mood, hopelessness, anxiety, (Hypo) Manic Symptoms:  Labiality of Mood, Anxiety Symptoms:  Excessive Worry, Psychotic Symptoms:  NA PTSD Symptoms: NA Total Time spent with patient: 30 minutes  Past Psychiatric History: see HPI  Is the patient at risk to self? Yes.    Has the patient been a risk to self in the past 6 months? Yes.    Has the patient been a risk to self within the distant past? Yes.    Is the patient a risk to others? No.  Has the patient been a risk to others in the past 6 months? No.  Has the patient been a risk to others within the distant past? No.   Prior Inpatient Therapy:   Prior Outpatient Therapy:    Alcohol Screening: 1. How often do you have a drink containing alcohol?: 4 or more times a week 2. How many drinks containing alcohol do you have on a typical day when you are drinking?: 5 or 6 3. How often do you have six or more drinks on one occasion?: Weekly Preliminary Score: 5 4. How often during the last year have you found that you were not able to stop drinking once you had started?: Never 5. How often during the last year have you failed to do what was normally expected from you becasue of drinking?: Never 6. How often during the last year have you needed a first drink in the morning to get yourself going after a heavy drinking session?: Never 7. How often during the last year have you had a  feeling of guilt of remorse after drinking?: Never 8. How often during the last year have you been unable to remember what happened the night before because you had been drinking?: Never 9. Have you or someone else been injured as a result of your drinking?: No 10. Has a relative or friend or a doctor or another health worker been concerned about your drinking or suggested you cut down?: No Alcohol Use Disorder  Identification Test Final Score (AUDIT): 9 Brief Intervention: Yes Substance Abuse History in the last 12 months:  Yes.   Consequences of Substance Abuse: NA Previous Psychotropic Medications: Yes  Psychological Evaluations: Yes  Past Medical History:  Past Medical History:  Diagnosis Date  . ADHD (attention deficit hyperactivity disorder)   . 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 Anxiety   . Arthritis   . Bipolar disorder (HCC)   . Chronic pain    back  . Depression   . Fatty liver   . Fracture of lateral malleolus of right fibula   . Hepatomegaly   . Hypertension   . OCD (obsessive compulsive disorder)   . Pancreatic pseudocyst   . Panic attack   . 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 Syncope    neurocardiogenic    Past Surgical History:  Procedure Laterality Date  . ABDOMINAL HYSTERECTOMY    . ANTERIOR AND POSTERIOR REPAIR    . BUNIONECTOMY Bilateral   . EP IMPLANTABLE DEVICE N/A 10/12/2016   Procedure: Pacemaker Implant;  Surgeon: Will Jorja Loa, MD;  Location: [facility];  Service: Cardiovascular;  Laterality: N/A;  . finfger surgery    . FINGER SURGERY    . LTCS    . MYRINGOTOMY    . ORIF ANKLE FRACTURE Right 07/25/2015   Procedure: OPEN REDUCTION INTERNAL FIXATION (ORIF) RIGHT ANKLE ;  Surgeon: Sheral Apley, MD;  Location: [facility];  Service: Orthopedics;  Laterality: Right;  . PACEMAKER PLACEMENT    . RHINOPLASTY    . TONSILLECTOMY     Family History:  Family History  Problem Relation Age of Onset  . Stroke Mother   . Kidney disease Mother   . Lung cancer Father     stage IV   . Stomach cancer Maternal Grandfather   . Colon cancer Paternal Grandfather    Family Psychiatric  History: see HPI Tobacco Screening: Have you used any form of tobacco in the last 30 days? (Cigarettes, Smokeless Tobacco, Cigars, and/or Pipes): Yes Tobacco use, Select  all that apply: 5 or more cigarettes per day Are you interested in Tobacco Cessation Medications?: Yes, will notify MD for an order Counseled patient on smoking cessation including recognizing danger situations, developing coping skills and basic information about quitting provided: Yes Social History:  History  Alcohol Use  . 0.0 oz/week    Comment: vodka daily      History  Drug Use No    Additional Social History:      History of alcohol / drug use?: Yes                    Allergies:   Allergies  Allergen Reactions  . Codeine Nausea And Vomiting  . Demerol [Meperidine] Other (See Comments)    Hallucinations    Lab Results:  Results for orders placed or performed during the hospital encounter of 02/03/17 (from the past 48 hour(s))  I-Stat  Troponin, ED (not at [facility])     Status: None   Collection Time: 02/03/17  8:58 PM  Result Value Ref Range   Troponin i, poc 0.00 0.00 - 0.08 ng/mL   Comment 3            Comment: Due to the release kinetics of cTnI, a negative result within the first hours of the onset of symptoms does not rule out myocardial infarction with certainty. If myocardial infarction is still suspected, repeat the test at appropriate intervals.     Blood Alcohol level:  Lab Results  Component Value Date   ETH 112 (H) 02/03/2017   ETH <5 12/05/2016    Metabolic Disorder Labs:  No results found for: HGBA1C, MPG No results found for: PROLACTIN Lab Results  Component Value Date   CHOL 125 09/16/2016   TRIG 171 (H) 09/16/2016   HDL 60 09/16/2016   CHOLHDL 2.1 09/16/2016   VLDL 34 09/16/2016   LDLCALC 31 09/16/2016    Current Medications: Current Facility-Administered Medications  Medication Dose Route Frequency Provider Last Rate Last Dose  . acetaminophen (TYLENOL) tablet 650 mg  650 mg Oral Q6H PRN Laveda Abbe, NP      . ALPRAZolam Prudy Feeler) tablet 0.25 mg  0.25 mg Oral 5 X Daily PRN Laveda Abbe, NP   0.25 mg at  02/05/17 0620  . alum & mag hydroxide-simeth (MAALOX/MYLANTA) 200-200-20 MG/5ML suspension 30 mL  30 mL Oral Q4H PRN Laveda Abbe, NP      . amphetamine-dextroamphetamine (ADDERALL) tablet 20 mg  20 mg Oral TID Laveda Abbe, NP   20 mg at 02/05/17 1610  . aspirin EC tablet 81 mg  81 mg Oral Daily Laveda Abbe, NP   81 mg at 02/05/17 0854  . atorvastatin (LIPITOR) tablet 40 mg  40 mg Oral q1800 Laveda Abbe, NP      . Brexpiprazole TABS 0.5 mg  0.5 mg Oral Daily Laveda Abbe, NP      . diltiazem (CARDIZEM CD) 24 hr capsule 120 mg  120 mg Oral Daily Laveda Abbe, NP   120 mg at 02/05/17 0854  . folic acid (FOLVITE) tablet 1 mg  1 mg Oral Daily Laveda Abbe, NP   1 mg at 02/05/17 0853  . hydrOXYzine (ATARAX/VISTARIL) tablet 25 mg  25 mg Oral Q6H PRN Laveda Abbe, NP      . hydrOXYzine (ATARAX/VISTARIL) tablet 25 mg  25 mg Oral Q6H PRN Laveda Abbe, NP      . loperamide (IMODIUM) capsule 2-4 mg  2-4 mg Oral PRN Laveda Abbe, NP      . LORazepam (ATIVAN) tablet 1 mg  1 mg Oral Q6H PRN Laveda Abbe, NP      . LORazepam (ATIVAN) tablet 1 mg  1 mg Oral QID Laveda Abbe, NP   1 mg at 02/05/17 0854   Followed by  . LORazepam (ATIVAN) tablet 1 mg  1 mg Oral TID Laveda Abbe, NP       Followed by  . [START ON 02/06/2017] LORazepam (ATIVAN) tablet 1 mg  1 mg Oral BID Laveda Abbe, NP       Followed by  . [START ON 02/08/2017] LORazepam (ATIVAN) tablet 1 mg  1 mg Oral Daily Laveda Abbe, NP      . losartan (COZAAR) tablet 100 mg  100 mg Oral Daily Laveda Abbe, NP   100 mg  at 02/05/17 0854  . magnesium hydroxide (MILK OF MAGNESIA) suspension 30 mL  30 mL Oral Daily PRN Laveda AbbeLaurie Britton Parks, NP      . multivitamin with minerals tablet 1 tablet  1 tablet Oral Daily Laveda AbbeLaurie Britton Parks, NP   1 tablet at 02/05/17 0800  . nicotine (NICODERM CQ - dosed in mg/24 hours) 21 mg/24hr patch            . nicotine (NICODERM CQ - dosed in mg/24 hours) patch 21 mg  21 mg Transdermal Daily Jackelyn PolingJason A Berry, NP   21 mg at 02/05/17 0620  . ondansetron (ZOFRAN-ODT) disintegrating tablet 4 mg  4 mg Oral Q6H PRN Laveda AbbeLaurie Britton Parks, NP      . sertraline (ZOLOFT) tablet 150 mg  150 mg Oral Daily Laveda AbbeLaurie Britton Parks, NP   150 mg at 02/05/17 0854  . thiamine (VITAMIN B-1) tablet 100 mg  100 mg Oral Daily Laveda AbbeLaurie Britton Parks, NP   100 mg at 02/05/17 0854  . traZODone (DESYREL) tablet 100-150 mg  100-150 mg Oral QHS PRN Laveda AbbeLaurie Britton Parks, NP   150 mg at 02/04/17 2126   PTA Medications: Prescriptions Prior to Admission  Medication Sig Dispense Refill Last Dose  . acetaminophen (TYLENOL) 500 MG tablet Take 1,000 mg by mouth every 6 (six) hours as needed for mild pain.   02/02/2017 at Unknown time  . ALPRAZolam (XANAX) 0.5 MG tablet Take 0.25 mg by mouth 5 (five) times daily as needed for anxiety.    02/02/2017 at Unknown time  . amphetamine-dextroamphetamine (ADDERALL) 20 MG tablet Take 20 mg by mouth 3 (three) times daily.   02/02/2017 at Unknown time  . aspirin EC 81 MG tablet Take 81 mg by mouth daily.   02/02/2017 at Unknown time  . atorvastatin (LIPITOR) 40 MG tablet Take 40 mg by mouth daily at 6 PM.    02/02/2017 at Unknown time  . Brexpiprazole (REXULTI) 0.5 MG TABS Take 0.5 mg by mouth daily.   02/02/2017 at Unknown time  . Brexpiprazole (REXULTI) 1 MG TABS Take 1 mg by mouth daily.   Not Taking at Unknown time  . diltiazem (CARDIZEM CD) 120 MG 24 hr capsule Take 1 capsule (120 mg total) by mouth daily. 90 capsule 3 02/02/2017 at Unknown time  . folic acid (FOLVITE) 1 MG tablet Take 1 tablet (1 mg total) by mouth daily. 30 tablet 0 02/02/2017 at Unknown time  . losartan (COZAAR) 100 MG tablet Take 100 mg by mouth daily.    02/02/2017 at Unknown time  . Multiple Vitamin (MULTIVITAMIN WITH MINERALS) TABS tablet Take 1 tablet by mouth daily. 30 tablet 0 02/02/2017 at Unknown time  . sertraline (ZOLOFT) 100 MG  tablet Take 150 mg by mouth daily.    02/02/2017 at Unknown time  . thiamine 100 MG tablet Take 1 tablet (100 mg total) by mouth daily. 30 tablet 0 02/02/2017 at Unknown time  . traZODone (DESYREL) 50 MG tablet Take 100-150 mg by mouth at bedtime as needed for sleep. For sleep.   02/02/2017 at Unknown time   Musculoskeletal: Strength & Muscle Tone: within normal limits Gait & Station: normal Patient leans: N/A  Psychiatric Specialty Exam: Physical Exam  Nursing note and vitals reviewed. Psychiatric: Thought content normal. Her mood appears anxious. She exhibits a depressed mood.    Review of Systems  Constitutional: Negative.   HENT: Negative.   Eyes: Negative.   Respiratory: Negative.   Gastrointestinal: Negative.   Skin: Negative.  Endo/Heme/Allergies: Negative.   Psychiatric/Behavioral: Positive for depression and substance abuse. The patient is nervous/anxious.     Blood pressure 112/69, pulse 84, temperature 98.4 F (36.9 C), resp. rate 18, height 5\' 4"  (1.626 m), weight 88 kg (194 lb), SpO2 97 %.Body mass index is 33.3 kg/m.  General Appearance: Casual  Eye Contact:  Good  Speech:  Clear and Coherent  Volume:  Normal  Mood:  Anxious  Affect:  Appropriate and Congruent  Thought Process:  Linear  Orientation:  Full (Time, Place, and Person)  Thought Content:  Logical and Rumination  Suicidal Thoughts:  No  Homicidal Thoughts:  No  Memory:  Immediate;   Fair Recent;   Fair Remote;   Fair  Judgement:  Fair  Insight:  Fair  Psychomotor Activity:  Normal  Concentration:  Concentration: Good and Attention Span: Good  Recall:  Good  Fund of Knowledge:  Good  Language:  Good  Akathisia:  No  Handed:  Right  AIMS (if indicated):     Assets:  Desire for Improvement Physical Health Resilience  ADL's:  Intact  Cognition:  WNL  Sleep:  Number of Hours: 6.75   Treatment Plan Summary: Admit for crisis management and mood stabilization. Medication management to  re-stabilize current mood symptoms Group counseling sessions for coping skills Medical consults as needed Review and reinstate any pertinent home medications for other health problems  Observation Level/Precautions:  15 minute checks  Laboratory:  per ED  Psychotherapy:  group  Medications:  DC XANAX AND ADDERALL.  CONT ATIVAN TAPEL FOR DETOX PROTOCOL, CHANGED REXULTI TO ABILIFY 5 MG MOOD STABILIZATION, RESUME ZOLOFT 150 MG DAILY DEPRESSION  Consultations:  As needed  Discharge Concerns:  safety  Estimated LOS:  2-7 days  Other:  PLEASE NOTE THAT PATIENT AS THIS NOTE WAS BEING WRITTEN, FELL AND LOST HER BALANCE, DESPITE SAFETY FALLS PRECAUTIONS.  SHE IS BEING SENT TO [facility] FOR FURTHER EVAL AND TREATMENT AND TO R/O SYNCOPAL EPISODE GIVEN HER SIGNIFICANT CARDIAC HISTORY.    Physician Treatment Plan for Primary Diagnosis: MDD (major depressive disorder), recurrent severe, without psychosis (HCC) Long Term Goal(s): Improvement in symptoms so as ready for discharge  Short Term Goals: Ability to identify changes in lifestyle to reduce recurrence of condition will improve, Ability to verbalize feelings will improve, Ability to disclose and discuss suicidal ideas, Ability to demonstrate self-control will improve, Ability to identify and develop effective coping behaviors will improve, Ability to maintain clinical measurements within normal limits will improve, Compliance with prescribed medications will improve and Ability to identify triggers associated with substance abuse/mental health issues will improve  Physician Treatment Plan for Secondary Diagnosis: Principal Problem:   MDD (major depressive disorder), recurrent severe, without psychosis (HCC)  Long Term Goal(s): Improvement in symptoms so as ready for discharge  Short Term Goals: Ability to identify changes in lifestyle to reduce recurrence of condition will improve, Ability to verbalize feelings will improve, Ability to disclose and discuss  suicidal ideas, Ability to demonstrate self-control will improve, Ability to identify and develop effective coping behaviors will improve, Ability to maintain clinical measurements within normal limits will improve, Compliance with prescribed medications will improve and Ability to identify triggers associated with substance abuse/mental health issues will improve  I certify that inpatient services furnished can reasonably be expected to improve the patient's condition.    [facility], NP [facility] 2/10/201811:14 AM

## 2017-02-05 NOTE — Progress Notes (Signed)
Patient has returned to the unit ambulatory and A&O x4. Patient denies pain at this time but endorses some fatigue. Patient seen with visitors in the dayroom. Patient remains with q15 min safety checks and high falls risk precautions in place. Patient without questions or concerns at this time. Patient is safe on the unit.

## 2017-02-05 NOTE — ED Notes (Signed)
Lying  B/P  118/79  P. 94 Sitting  B/P  126/86  P. 107 Standing  B/P  117/80  P 107

## 2017-02-05 NOTE — ED Notes (Signed)
Walked to bathroom, asked to give urine sample.Marland Kitchen..Marland Kitchen

## 2017-02-05 NOTE — Progress Notes (Signed)
D: Patient up and visible in the milieu. Spoke with patient 1:1. Rates sleep as poor, appetite as fair, energy as low and concentration as poor. Patient's affect anxious, sad and depressed with congruent mood. Rating depression at a 7.5/10, hopelessness at an 8/10 and anxiety at a 7/10. States goal for today is to "stabilize my mood, end relentless depression. I have not been out of bed in 4 weeks, showered or brushed teeth in 2 weeks. I have not functioned as a mother, a person or a caretaker with only brief times I was healthy." Denies pain, physical problems however patient remains tachycardic which she states, "is normal for me."   A: Medicated per orders. Emotional support offered and self inventory reviewed. Encouraged completion of Suicide Safety Plan. Discussed POC with MD, SW.  Fall precautions in place and reviewed.   R: Patient verbalizes understanding of POC.  Patient denies SI/HI and remains safe on level III obs. Will continue to monitor closely.

## 2017-02-05 NOTE — Progress Notes (Signed)
Patient returned from ED with CBG of 323 and K level of 3.3. NP Barbara CowerJason notified and new orders obtained. CBG obtained and was 190 at 2045. Patient medicated with K-Clor 20 mEq, see EMAR. New orders received for lab draw in the morning. Patient remains A&O, pain 0/10 and is without issues or concerns.   Patient on q 4hr vital checks. Gatorade and fluids provided.

## 2017-02-05 NOTE — ED Notes (Signed)
Pt ambulatory to bathroom at this time without any problems 

## 2017-02-06 DIAGNOSIS — Z79899 Other long term (current) drug therapy: Secondary | ICD-10-CM

## 2017-02-06 DIAGNOSIS — I442 Atrioventricular block, complete: Secondary | ICD-10-CM

## 2017-02-06 DIAGNOSIS — Z7982 Long term (current) use of aspirin: Secondary | ICD-10-CM

## 2017-02-06 DIAGNOSIS — F17210 Nicotine dependence, cigarettes, uncomplicated: Secondary | ICD-10-CM

## 2017-02-06 DIAGNOSIS — F332 Major depressive disorder, recurrent severe without psychotic features: Principal | ICD-10-CM

## 2017-02-06 LAB — POTASSIUM: Potassium: 3.4 mmol/L — ABNORMAL LOW (ref 3.5–5.1)

## 2017-02-06 LAB — GLUCOSE, CAPILLARY: Glucose-Capillary: 167 mg/dL — ABNORMAL HIGH (ref 65–99)

## 2017-02-06 NOTE — Progress Notes (Signed)
D: Patient up and visible in the milieu. Spoke with patient 1:1. Rates sleep as poor, appetite as fair, energy as low and concentration as poor. Patient's affect is anxious with congruent mood. Patient's mobility, color improved from yesterday. Steadier gate observed. Patient rates depression at an 8/10, hopelessness at a 9/10 and anxiety at a 7-8/10. States goal for today is to work on "depression, anxiety and panic attacks." Reporting pain at a 6/10 for back pain. Complaining of congestion, cough.  A: Medicated per orders, tylenol and mucinex prn given. Emotional support offered and self inventory reviewed. Encouraged completion of Suicide Safety Plan. Discussed POC with Dominga FerryAgustin, NP. Updated NP on lab work in ED and from this morning (potassium, blood glucose).  Fall precautions reviewed and in place.   R: No new orders received. Patient verbalizes understanding of POC, falls teaching. On reassess, patient reports pain relief and reduction in cold symptoms. Patient denies SI/HI and remains safe on level III obs.

## 2017-02-06 NOTE — Plan of Care (Signed)
Problem: Education: Goal: Mental status will improve Outcome: Progressing Patient says thoughts of SI are decreasing.   Problem: Coping: Goal: Ability to verbalize feelings will improve Outcome: Progressing Patient increasingly able to discuss with writer feelings of depression and SI.

## 2017-02-06 NOTE — [facility] Group Notes (Signed)
[facility] Group Notes: (Clinical Social Work)   02/06/2017      Type of Therapy:  Group Therapy   Participation Level:  Did Not Attend despite MHT prompting   Davan Hark Grossman-Orr, LCSW 02/06/2017, 12:12 PM     

## 2017-02-06 NOTE — [facility] Suicide Risk Assessment (Signed)
[facility] Admission Suicide Risk Assessment   Nursing information obtained from:    Demographic factors:    Current Mental Status:    Loss Factors:    Historical Factors:    Risk Reduction Factors:     Total Time spent with patient: 45 minutes Principal Problem: MDD (major depressive disorder), recurrent severe, without psychosis (HCC) Diagnosis:   Patient Active Problem List   Diagnosis Date Noted  . MDD (major depressive disorder), recurrent severe, without psychosis (HCC) [F33.2] 02/04/2017  . Complete heart block (HCC) [I44.2]   . Palpitation [R00.2] 10/11/2016  . Palpitations [R00.2] 10/11/2016  . Acute pancreatitis [K85.90] 09/15/2016  . Alcohol abuse [F10.10] 09/15/2016  . ADHD (attention deficit hyperactivity disorder) [F90.9] 09/15/2016  . Tobacco use [Z72.0] 09/15/2016  . Anxiety [F41.9] 09/15/2016  . Hypokalemia [E87.6] 09/15/2016  . Pancreatitis [K85.90] 07/09/2016  . Alcoholic pancreatitis [K85.20] 07/08/2016  . Alcohol withdrawal (HCC) [F10.239] 07/08/2016  . Bipolar disorder (HCC) [F31.9] 07/08/2016  . Essential hypertension [I10] 07/08/2016  . Left shoulder pain [M25.512] 09/09/2014   Subjective Data: Patient is 56 year old female who was admitted due to severe depression and having passive and fleeting suicidal thoughts.  She had multiple stressors.  She had recent heart surgery, father has stage IV cancer.  She is taking care of her mother who is sick.  She admitted lately very depressed, isolated, withdrawn and isolated.  Upon admission, after a few hours she felt symptomatic and fell.  Her blood pressure was low.  She was transferred the emergency room and given fluid and to stabilize.  Patient return to the unit and today she is feeling better.  Her depression remained the same.  Patient requires inpatient treatment and stabilization.  Please see history and physical for more detail.  Continued Clinical Symptoms:  Alcohol Use Disorder Identification Test Final Score  (AUDIT): 9 The "Alcohol Use Disorders Identification Test", Guidelines for Use in Primary Care, Second Edition.  World Science writer [facility]). Score between 0-7:  no or low risk or alcohol related problems. Score between 8-15:  moderate risk of alcohol related problems. Score between 16-19:  high risk of alcohol related problems. Score 20 or above:  warrants further diagnostic evaluation for alcohol dependence and treatment.   CLINICAL FACTORS:   Depression:   Anhedonia Hopelessness Insomnia More than one psychiatric diagnosis Previous Psychiatric Diagnoses and Treatments Medical Diagnoses and Treatments/Surgeries   Musculoskeletal: Strength & Muscle Tone: within normal limits Gait & Station: normal Patient leans: N/A  Psychiatric Specialty Exam: Physical Exam  ROS  Blood pressure 121/81, pulse (!) 109, temperature 98.1 F (36.7 C), resp. rate 18, height 5\' 4"  (1.626 m), weight 88 kg (194 lb), SpO2 97 %.Body mass index is 33.3 kg/m.  General Appearance: Casual  Eye Contact:  Fair  Speech:  Slow  Volume:  Decreased  Mood:  Anxious, Depressed and Dysphoric  Affect:  Constricted and Depressed  Thought Process:  Goal Directed  Orientation:  Full (Time, Place, and Person)  Thought Content:  Rumination  Suicidal Thoughts:  Yes.  without intent/plan  Homicidal Thoughts:  No  Memory:  Immediate;   Fair Recent;   Fair Remote;   Fair  Judgement:  Fair  Insight:  Fair  Psychomotor Activity:  Decreased  Concentration:  Concentration: Fair and Attention Span: Fair  Recall:  [employer] of Knowledge:  Good  Language:  Good  Akathisia:  No  Handed:  Right  AIMS (if indicated):     Assets:  Communication Skills Desire  for Improvement Housing  ADL's:  Intact  Cognition:  WNL  Sleep:  Number of Hours: 5.75      COGNITIVE FEATURES THAT CONTRIBUTE TO RISK:  Polarized thinking and Thought constriction (tunnel vision)    SUICIDE RISK:   Mild:  Suicidal ideation of  limited frequency, intensity, duration, and specificity.  There are no identifiable plans, no associated intent, mild dysphoria and related symptoms, good self-control (both objective and subjective assessment), few other risk factors, and identifiable protective factors, including available and accessible social support.  PLAN OF CARE: Patient is 56 year old female who was admitted due to severe depression and having fleeting and passive suicidal thoughts.  She has multiple stressors.  She also had chronic health issues and recently put pacemaker.  Yesterday she was taken to the emergency room after felt symptomatic, fell and hypertensive.  She is doing better.  Patient requires inpatient treatment and stabilization.  Please see history and physical for more detailed plan of care.  I certify that inpatient services furnished can reasonably be expected to improve the patient's condition.   ARFEEN,SYED T., MD 02/06/2017, 11:34 AM
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
## 2017-02-06 NOTE — Progress Notes (Signed)
D: Pt was in her room upon initial approach.  Pt presents with anxious, depressed affect and mood.  She describes her day as "very boring, I just stay in bed."  Pt denies SI/HI, denies hallucinations, reports pain from headache of 5/10.  Pt has been visible in milieu interacting with peers and staff appropriately.  Pt attended evening group.   A: Introduced self to pt.  Actively listened to pt and offered support and encouragement. PRN medication administered for pain, anxiety, cough, and sleep.  Fall prevention techniques reviewed with pt, pt verbalized understanding.  R: Pt is safe on the unit.  Pt is compliant with medications.  Pt verbally contracts for safety.

## 2017-02-06 NOTE — Plan of Care (Signed)
Problem: Coping: Goal: Ability to demonstrate self-control will improve Outcome: Progressing Patient is pleasant, calm and cooperative. Able to verbalize needs.  Problem: Education: Goal: Knowledge of the prescribed therapeutic regimen will improve Outcome: Progressing Patient aware of tx plan and involved with her care. Verbalizes understanding of information and education provided.

## 2017-02-06 NOTE — Progress Notes (Signed)
Adult Psychoeducational Group Note  Date:  02/06/2017 Time:  9:25 PM  Group Topic/Focus:  Wrap-Up Group:   The focus of this group is to help patients review their daily goal of treatment and discuss progress on daily workbooks.  Participation Level:  Active  Participation Quality:  Appropriate  Affect:  Appropriate  Cognitive:  Appropriate  Insight: Appropriate  Engagement in Group:  Engaged  Modes of Intervention:  Discussion  Additional Comments:  Patient attended wrap-up group and said that her day was a 4. Patient was excited today because she was her dad.   Duaa Stelzner W Corbitt Cloke 02/06/2017, 9:25 PM

## 2017-02-06 NOTE — Progress Notes (Signed)
Brittney Cooper  02/06/2017 9:21 AM Brittney Cooper  MRN:  [id number] Subjective:  Patient states that she is doing better.  Does feel the effects of not having her Adderall makes her quite lethargic.  Patient has BAL 112 and contraindicative to taking Adderall.  Patient fell yesterday, significant cardiac hx and recent placement of pacemaker.  Transferred ot [facility] yesterday to r/o cause of falling.  Per nursing IVF were given and patient has better vital signs Objective:  Brittney Cooper was admitted for MDD and self neglect.  Contributing factors  Principal Problem: MDD (major depressive disorder), recurrent severe, without psychosis ([place]) Diagnosis:   Patient Active Problem List   Diagnosis Date Noted  . MDD (major depressive disorder), recurrent severe, without psychosis ([place]) [F33.2] 02/04/2017    Priority: High  . Complete heart block ([place]) [I44.2]   . Palpitation [R00.2] 10/11/2016  . Palpitations [R00.2] 10/11/2016  . Acute pancreatitis [K85.90] 09/15/2016  . Alcohol abuse [F10.10] 09/15/2016  . ADHD (attention deficit hyperactivity disorder) [F90.9] 09/15/2016  . Tobacco use [Z72.0] 09/15/2016  . Anxiety [F41.9] 09/15/2016  . Hypokalemia [E87.6] 09/15/2016  . Pancreatitis [K85.90] 07/09/2016  . Alcoholic pancreatitis [B93.90] 07/08/2016  . Alcohol withdrawal ([place]) [F10.239] 07/08/2016  . Bipolar disorder ([place]) [F31.9] 07/08/2016  . Essential hypertension [I10] 07/08/2016  . Left shoulder pain [M25.512] 09/09/2014   Total Time spent with patient: 30 minutes  Past Psychiatric History:    Past Medical History:  Past Medical History:  Diagnosis Date  . ADHD (attention deficit hyperactivity disorder)   . Alcoholic pancreatitis 30/09, 11/16, 7/17  . Anxiety   . Arthritis   . Bipolar disorder ([place])   . Chronic pain    back  . Depression   . Fatty liver   . Fracture of lateral malleolus of right fibula   . Hepatomegaly   . Hypertension   . OCD (obsessive compulsive  disorder)   . Pancreatic pseudocyst   . Panic attack   . 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 Syncope    neurocardiogenic    Past Surgical History:  Procedure Laterality Date  . ABDOMINAL HYSTERECTOMY    . ANTERIOR AND POSTERIOR REPAIR    . BUNIONECTOMY Bilateral   . EP IMPLANTABLE DEVICE N/A 10/12/2016   Procedure: Pacemaker Implant;  Surgeon: Will Meredith Leeds, MD;  Location: [facility];  Service: Cardiovascular;  Laterality: N/A;  . finfger surgery    . FINGER SURGERY    . LTCS    . MYRINGOTOMY    . ORIF ANKLE FRACTURE Right 07/25/2015   Procedure: OPEN REDUCTION INTERNAL FIXATION (ORIF) RIGHT ANKLE ;  Surgeon: Renette Butters, MD;  Location: [facility];  Service: Orthopedics;  Laterality: Right;  . PACEMAKER PLACEMENT    . RHINOPLASTY    . TONSILLECTOMY     Family History:  Family History  Problem Relation Age of Onset  . Stroke Mother   . Kidney disease Mother   . Lung cancer Father     stage IV   . Stomach cancer Maternal Grandfather   . Colon cancer Paternal Grandfather    Family Psychiatric  History: see HPI Social History:  History  Alcohol Use  . 0.0 oz/week    Comment: vodka daily      History  Drug Use No    Social History   Social History  . Marital status: Married  Spouse name: N/A  . Number of children: N/A  . Years of education: N/A   Occupational History  . RN    Social History Main Topics  . Smoking status: Current Every Day Smoker    Packs/day: 1.00    Years: 15.00    Types: Cigarettes  . Smokeless tobacco: Never Used  . Alcohol use 0.0 oz/week     Comment: vodka daily   . Drug use: No  . Sexual activity: Not Currently    Birth control/ protection: Surgical, Abstinence   Other Topics Concern  . None   Social History Narrative  . None   Additional Social History:    History of alcohol / drug use?: Yes                     Sleep: Good  Appetite:  Good  Current Medications: Current Facility-Administered Medications  Medication Dose Route Frequency Provider Last Rate Last Dose  . acetaminophen (TYLENOL) tablet 650 mg  650 mg Oral Q6H PRN Ethelene Hal, NP   650 mg at 02/06/17 0820  . alum & mag hydroxide-simeth (MAALOX/MYLANTA) 200-200-20 MG/5ML suspension 30 mL  30 mL Oral Q4H PRN Ethelene Hal, NP      . ARIPiprazole (ABILIFY) tablet 5 mg  5 mg Oral Daily Kerrie Buffalo, NP   5 mg at 02/06/17 0804  . aspirin EC tablet 81 mg  81 mg Oral Daily Ethelene Hal, NP   81 mg at 02/06/17 0805  . atorvastatin (LIPITOR) tablet 40 mg  40 mg Oral q1800 Ethelene Hal, NP      . diltiazem (CARDIZEM CD) 24 hr capsule 120 mg  120 mg Oral Daily Ethelene Hal, NP   120 mg at 02/06/17 0805  . folic acid (FOLVITE) tablet 1 mg  1 mg Oral Daily Ethelene Hal, NP   1 mg at 02/06/17 0805  . guaiFENesin (ROBITUSSIN) 100 MG/5ML solution 200 mg  10 mL Oral Q4H PRN Rozetta Nunnery, NP   200 mg at 02/06/17 5462  . hydrOXYzine (ATARAX/VISTARIL) tablet 25 mg  25 mg Oral Q6H PRN Ethelene Hal, NP      . hydrOXYzine (ATARAX/VISTARIL) tablet 25 mg  25 mg Oral Q6H PRN Ethelene Hal, NP      . loperamide (IMODIUM) capsule 2-4 mg  2-4 mg Oral PRN Ethelene Hal, NP      . LORazepam (ATIVAN) tablet 1 mg  1 mg Oral Q6H PRN Ethelene Hal, NP      . LORazepam (ATIVAN) tablet 1 mg  1 mg Oral TID Ethelene Hal, NP   1 mg at 02/06/17 0805   Followed by  . LORazepam (ATIVAN) tablet 1 mg  1 mg Oral BID Ethelene Hal, NP       Followed by  . [START ON 02/08/2017] LORazepam (ATIVAN) tablet 1 mg  1 mg Oral Daily Ethelene Hal, NP      . losartan (COZAAR) tablet 100 mg  100 mg Oral Daily Ethelene Hal, NP   100 mg at 02/06/17 0804  . magnesium hydroxide (MILK OF MAGNESIA) suspension 30 mL  30 mL Oral Daily PRN Ethelene Hal, NP      . multivitamin with  minerals tablet 1 tablet  1 tablet Oral Daily Ethelene Hal, NP   1 tablet at 02/06/17 0805  . nicotine (NICODERM CQ - dosed in mg/24 hours) patch 21 mg  21 mg  Transdermal Daily Rozetta Nunnery, NP   21 mg at 02/06/17 0807  . ondansetron (ZOFRAN-ODT) disintegrating tablet 4 mg  4 mg Oral Q6H PRN Ethelene Hal, NP      . sertraline (ZOLOFT) tablet 150 mg  150 mg Oral Daily Ethelene Hal, NP   150 mg at 02/06/17 0804  . thiamine (VITAMIN B-1) tablet 100 mg  100 mg Oral Daily Ethelene Hal, NP   100 mg at 02/06/17 0805  . traZODone (DESYREL) tablet 100-150 mg  100-150 mg Oral QHS PRN Ethelene Hal, NP   100 mg at 02/05/17 2201    Lab Results:  Results for orders placed or performed during the hospital encounter of 02/04/17 (from the past 48 hour(s))  I-stat troponin, ED     Status: None   Collection Time: 02/05/17  2:24 PM  Result Value Ref Range   Troponin i, poc 0.00 0.00 - 0.08 ng/mL   Comment 3            Comment: Due to the release kinetics of cTnI, a negative result within the first hours of the onset of symptoms does not rule out myocardial infarction with certainty. If myocardial infarction is still suspected, repeat the test at appropriate intervals.   Basic metabolic panel     Status: Abnormal   Collection Time: 02/05/17  2:26 PM  Result Value Ref Range   Sodium 133 (L) 135 - 145 mmol/L   Potassium 3.3 (L) 3.5 - 5.1 mmol/L   Chloride 98 (L) 101 - 111 mmol/L   CO2 23 22 - 32 mmol/L   Glucose, Bld 323 (H) 65 - 99 mg/dL   BUN 12 6 - 20 mg/dL   Creatinine, Ser 0.95 0.44 - 1.00 mg/dL   Calcium 8.7 (L) 8.9 - 10.3 mg/dL   GFR calc non Af Amer >60 >60 mL/min   GFR calc Af Amer >60 >60 mL/min    Comment: ([place]) The eGFR has been calculated using the CKD EPI equation. This calculation has not been validated in all clinical situations. eGFR's persistently <60 mL/min signify possible Chronic Kidney Disease.    Anion gap 12 5 - 15  CBC     Status:  None   Collection Time: 02/05/17  2:26 PM  Result Value Ref Range   WBC 6.6 4.0 - 10.5 K/uL   RBC 3.94 3.87 - 5.11 MIL/uL   Hemoglobin 13.1 12.0 - 15.0 g/dL   HCT 38.8 36.0 - 46.0 %   MCV 98.5 78.0 - 100.0 fL   MCH 33.2 26.0 - 34.0 pg   MCHC 33.8 30.0 - 36.0 g/dL   RDW 14.8 11.5 - 15.5 %   Platelets 166 150 - 400 K/uL  Glucose, capillary     Status: Abnormal   Collection Time: 02/05/17  8:47 PM  Result Value Ref Range   Glucose-Capillary 190 (H) 65 - 99 mg/dL   Comment 1 Notify RN    Comment 2 Document in Chart   Glucose, capillary     Status: Abnormal   Collection Time: 02/06/17  5:52 AM  Result Value Ref Range   Glucose-Capillary 167 (H) 65 - 99 mg/dL   Comment 1 Notify RN   Potassium     Status: Abnormal   Collection Time: 02/06/17  6:04 AM  Result Value Ref Range   Potassium 3.4 (L) 3.5 - 5.1 mmol/L    Comment: Performed at [facility], [place] [address], [place], [place] [address]  Blood Alcohol level:  Lab Results  Component Value Date   ETH 112 (H) 02/03/2017   ETH <5 20/94/7096    Metabolic Disorder Labs: No results found for: HGBA1C, MPG No results found for: PROLACTIN Lab Results  Component Value Date   CHOL 125 09/16/2016   TRIG 171 (H) 09/16/2016   HDL 60 09/16/2016   CHOLHDL 2.1 09/16/2016   VLDL 34 09/16/2016   LDLCALC 31 09/16/2016    Physical Findings: AIMS: Facial and Oral Movements Muscles of Facial Expression: None, normal Lips and Perioral Area: None, normal Jaw: None, normal Tongue: None, normal,Extremity Movements Upper (arms, wrists, hands, fingers): None, normal Lower (legs, knees, ankles, toes): None, normal, Trunk Movements Neck, shoulders, hips: None, normal, Overall Severity Severity of abnormal movements (highest score from questions above): None, normal Incapacitation due to abnormal movements: None, normal Patient's awareness of abnormal movements (rate only patient's report): No Awareness, Dental  Status Current problems with teeth and/or dentures?: No Does patient usually wear dentures?: No  CIWA:  CIWA-Ar Total: 1 COWS:  COWS Total Score: 0  Musculoskeletal: Strength & Muscle Tone: within normal limits Gait & Station: normal Patient leans: N/A  Psychiatric Specialty Exam: Physical Exam  Nursing Cooper and vitals reviewed.   ROS  Blood pressure 121/81, pulse (!) 109, temperature 98.1 F (36.7 C), resp. rate 18, height 5' 4" (1.626 m), weight 88 kg (194 lb), SpO2 97 %.Body mass index is 33.3 kg/m.  General Appearance: Casual  Eye Contact:  Good  Speech:  Clear and Coherent  Volume:  Normal  Mood:  Anxious  Affect:  Appropriate  Thought Process:  Disorganized  Orientation:  Full (Time, Place, and Person)  Thought Content:  Rumination  Suicidal Thoughts:  No  Homicidal Thoughts:  No  Memory:  Immediate;   Fair Recent;   Fair Remote;   Fair  Judgement:  Fair  Insight:  Fair  Psychomotor Activity:  Normal  Concentration:  Concentration: Fair and Attention Span: Good  Recall:  Good  Fund of Knowledge:  Good  Language:  Good  Akathisia:  No  Handed:  Right  AIMS (if indicated):     Assets:  Desire for Improvement Physical Health Resilience  ADL's:  Intact  Cognition:  WNL  Sleep:  Number of Hours: 5.75   Treatment Plan Summary: Review of chart, vital signs, medications, and notes.  1-Individual and group therapy  2-Medication management for depression and anxiety: Medications reviewed with the patient.  3-Coping skills for depression, anxiety  4-Continue crisis stabilization and management  5-Address health issues--monitoring vital signs, stable  6-Treatment plan in progress to prevent relapse of depression and anxiety  Janett Labella, NP [facility] 02/06/2017, 9:21 AM

## 2017-02-06 NOTE — Progress Notes (Signed)
Nursing Progress Note 7p-7a  D) Patient presents with blunted affect but is pleasant and cooperative. Patient states she is "feeling better" but is "a little tired". Patient visited with her mother and a family friend in the dayroom during visitation. Patient did not attend group but was given snacks and fluids. Patient denies SI/HI/AVH or pain. Patient complains of congestion and is coughing. Patient contracts for safety.  A) Emotional support given. NP Barbara CowerJason notified and new orders received. Patient medicated with PM and PRN orders as prescribed. Medications reviewed with patient. Patient provided water and Gatorade and encouraged to drink. HOB elevated to help patient breathe while sleeping. VS obtained as ordered. VSS. Patient on q15 min safety checks. Opportunities for questions or concerns presented to patient. Patient encouraged to continue to work on treatment goals.  R) Patient receptive to interaction with nurse. Patient remains safe on the unit at this time. Patient is resting in bed without complaints. Will continue to monitor.

## 2017-02-07 DIAGNOSIS — F1010 Alcohol abuse, uncomplicated: Secondary | ICD-10-CM

## 2017-02-07 LAB — HEMOGLOBIN A1C
Hgb A1c MFr Bld: 6.6 % — ABNORMAL HIGH (ref 4.8–5.6)
MEAN PLASMA GLUCOSE: 143 mg/dL

## 2017-02-07 MED ORDER — LORAZEPAM 1 MG PO TABS
1.0000 mg | ORAL_TABLET | Freq: Four times a day (QID) | ORAL | Status: AC | PRN
  Filled 2017-02-07: qty 1

## 2017-02-07 MED ORDER — SERTRALINE HCL 100 MG PO TABS
100.0000 mg | ORAL_TABLET | Freq: Two times a day (BID) | ORAL | Status: DC
  Filled 2017-02-07 (×4): qty 1

## 2017-02-07 MED ORDER — TRAZODONE HCL 100 MG PO TABS
100.0000 mg | ORAL_TABLET | Freq: Every evening | ORAL | Status: DC | PRN
  Administered 2017-02-07 – 2017-02-08 (×2): 100 mg via ORAL
  Filled 2017-02-07 (×2): qty 1

## 2017-02-07 MED ORDER — SERTRALINE HCL 100 MG PO TABS
100.0000 mg | ORAL_TABLET | Freq: Two times a day (BID) | ORAL | Status: DC
  Administered 2017-02-08 – 2017-02-09 (×3): 100 mg via ORAL
  Filled 2017-02-07 (×6): qty 1

## 2017-02-07 MED ORDER — METFORMIN HCL 500 MG PO TABS
500.0000 mg | ORAL_TABLET | Freq: Two times a day (BID) | ORAL | Status: DC
  Administered 2017-02-07 – 2017-02-09 (×4): 500 mg via ORAL
  Filled 2017-02-07 (×6): qty 1

## 2017-02-07 NOTE — Progress Notes (Signed)
Recreation Therapy Notes  Date: 02/07/17 Time: 0930 Location: [address] Group Room  Group Topic: Stress Management  Goal Area(s) Addresses:  Patient will verbalize importance of using healthy stress management.  Patient will identify positive emotions associated with healthy stress management.   Intervention: Stress Management  Activity :  Meditation.  LRT played a meditation on how to deal with stress from the Calm app.  Patients were to follow along as the meditation played.  Education:  Stress Management, Discharge Planning.   Education Outcome: Acknowledges edcuation/In group clarification offered/Needs additional education  Clinical Observations/Feedback: Pt did not attend group.   Caroll RancherMarjette Even Budlong, LRT/CTRS         Caroll RancherLindsay, Patrizia Paule A 02/07/2017 1:11 PM

## 2017-02-07 NOTE — [facility] Group Notes (Addendum)
[facility] LCSW Group Therapy  02/07/2017 1:15pm  Type of Therapy: Group Therapy   Topic: Overcoming Obstacles  Participation Level: Pt invited. Did not attend.   Jonathon JordanLynn B Shatisha Falter, MSW, Theresia MajorsLCSWA [phone]

## 2017-02-07 NOTE — Progress Notes (Addendum)
[facility] MD Progress Note  02/07/2017 2:24 PM Brittney Cooper  MRN:  [id number] Subjective:  Patient reports ongoing depression, sadness, anxiety. Reports passive thoughts of " just wanting to give up", but denies any suicidal plan or intention . At this time denies medication side effects. Objective : I have discussed case with treatment team and have met with patient . Patient is a 56 year old female , admitted due to worsening depression, passive SI. Recent significant stressors, to include parents being medically ill, history of heart block for which she required pacemaker placement last year, being out of work for a long period of time. Of note,patient reports recent heavy alcohol consumption on a regular, often daily basis, over recent weeks. She was being prescribed Xanax, Adderall, Zoloft .  Today describes some improvement but continues to report feeling depressed, sad , and remains intermittently tearful during session. Denies any active suicidal ideations and contracts for safety on unit, but describe, as above, some passive SI. Reports ongoing depression, anxiety, but denies any current suicidal ideations. Currently not presenting with tremors, diaphoresis, agitation or other symptoms of alcohol WDL . Denies medication side effects.  Principal Problem: MDD (major depressive disorder), recurrent severe, without psychosis ([place]) Diagnosis:   Patient Active Problem List   Diagnosis Date Noted  . MDD (major depressive disorder), recurrent severe, without psychosis ([place]) [F33.2] 02/04/2017  . Complete heart block ([place]) [I44.2]   . Palpitation [R00.2] 10/11/2016  . Palpitations [R00.2] 10/11/2016  . Acute pancreatitis [K85.90] 09/15/2016  . Alcohol abuse [F10.10] 09/15/2016  . ADHD (attention deficit hyperactivity disorder) [F90.9] 09/15/2016  . Tobacco use [Z72.0] 09/15/2016  . Anxiety [F41.9] 09/15/2016  . Hypokalemia [E87.6] 09/15/2016  . Pancreatitis [K85.90] 07/09/2016  . Alcoholic  pancreatitis [N40.76] 07/08/2016  . Alcohol withdrawal ([place]) [F10.239] 07/08/2016  . Bipolar disorder ([place]) [F31.9] 07/08/2016  . Essential hypertension [I10] 07/08/2016  . Left shoulder pain [M25.512] 09/09/2014   Total Time spent with patient: 20 minutes   Past Medical History:  Past Medical History:  Diagnosis Date  . ADHD (attention deficit hyperactivity disorder)   . Alcoholic pancreatitis 80/88, 11/16, 7/17  . Anxiety   . Arthritis   . Bipolar disorder ([place])   . Chronic pain    back  . Depression   . Fatty liver   . Fracture of lateral malleolus of right fibula   . Hepatomegaly   . Hypertension   . OCD (obsessive compulsive disorder)   . Pancreatic pseudocyst   . Panic attack   . 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 Syncope    neurocardiogenic    Past Surgical History:  Procedure Laterality Date  . ABDOMINAL HYSTERECTOMY    . ANTERIOR AND POSTERIOR REPAIR    . BUNIONECTOMY Bilateral   . EP IMPLANTABLE DEVICE N/A 10/12/2016   Procedure: Pacemaker Implant;  Surgeon: Will Meredith Leeds, MD;  Location: [place] [facility];  Service: Cardiovascular;  Laterality: N/A;  . finfger surgery    . FINGER SURGERY    . LTCS    . MYRINGOTOMY    . ORIF ANKLE FRACTURE Right 07/25/2015   Procedure: OPEN REDUCTION INTERNAL FIXATION (ORIF) RIGHT ANKLE ;  Surgeon: Renette Butters, MD;  Location: [place];  Service: Orthopedics;  Laterality: Right;  . PACEMAKER PLACEMENT    . RHINOPLASTY    . TONSILLECTOMY     Family History:  Family History  Problem Relation Age of Onset  . Stroke Mother   . Kidney disease Mother   . Lung cancer Father     stage IV   . Stomach cancer Maternal Grandfather   . Colon cancer Paternal Grandfather    Social History:  History  Alcohol Use  . 0.0 oz/week    Comment: vodka daily      History  Drug Use No    Social History   Social History  . Marital  status: Married    Spouse name: N/A  . Number of children: N/A  . Years of education: N/A   Occupational History  . RN    Social History Main Topics  . Smoking status: Current Every Day Smoker    Packs/day: 1.00    Years: 15.00    Types: Cigarettes  . Smokeless tobacco: Never Used  . Alcohol use 0.0 oz/week     Comment: vodka daily   . Drug use: No  . Sexual activity: Not Currently    Birth control/ protection: Surgical, Abstinence   Other Topics Concern  . None   Social History Narrative  . None   Additional Social History:    History of alcohol / drug use?: Yes  Sleep: Fair  Appetite:  Fair  Current Medications: Current Facility-Administered Medications  Medication Dose Route Frequency Provider Last Rate Last Dose  . acetaminophen (TYLENOL) tablet 650 mg  650 mg Oral Q6H PRN Ethelene Hal, NP   650 mg at 02/07/17 1416  . alum & mag hydroxide-simeth (MAALOX/MYLANTA) 200-200-20 MG/5ML suspension 30 mL  30 mL Oral Q4H PRN Ethelene Hal, NP      . ARIPiprazole (ABILIFY) tablet 5 mg  5 mg Oral Daily Kerrie Buffalo, NP   5 mg at 02/07/17 0814  . aspirin EC tablet 81 mg  81 mg Oral Daily Ethelene Hal, NP   81 mg at 02/07/17 1950  . atorvastatin (LIPITOR) tablet 40 mg  40 mg Oral q1800 Ethelene Hal, NP   40 mg at 02/06/17 1813  . diltiazem (CARDIZEM CD) 24 hr capsule 120 mg  120 mg Oral Daily Ethelene Hal, NP   120 mg at 02/07/17 0813  . folic acid (FOLVITE) tablet 1 mg  1 mg Oral Daily Ethelene Hal, NP   1 mg at 02/07/17 0813  . guaiFENesin (ROBITUSSIN) 100 MG/5ML solution 200 mg  10 mL Oral Q4H PRN Rozetta Nunnery, NP   200 mg at 02/07/17 0817  . hydrOXYzine (ATARAX/VISTARIL) tablet 25 mg  25 mg Oral Q6H PRN Ethelene Hal, NP   25 mg at 02/07/17 1057  . loperamide (IMODIUM) capsule 2-4 mg  2-4 mg Oral PRN Ethelene Hal, NP      . Derrill Memo ON 02/08/2017] LORazepam (ATIVAN) tablet 1 mg  1 mg Oral Daily Ethelene Hal, NP      . LORazepam (ATIVAN) tablet 1 mg  1 mg Oral Q6H PRN Jenne Campus, MD      . losartan (COZAAR) tablet 100 mg  100 mg Oral Daily Ethelene Hal, NP   100 mg at 02/07/17 9326  . magnesium hydroxide (MILK OF MAGNESIA) suspension 30 mL  30 mL Oral Daily PRN Ethelene Hal, NP      . multivitamin with minerals tablet 1 tablet  1 tablet Oral Daily Ethelene Hal, NP   1 tablet at 02/07/17 [phone]  . nicotine (NICODERM CQ - dosed in mg/24 hours) patch 21  mg  21 mg Transdermal Daily Rozetta Nunnery, NP   21 mg at 02/07/17 0814  . ondansetron (ZOFRAN-ODT) disintegrating tablet 4 mg  4 mg Oral Q6H PRN Ethelene Hal, NP      . sertraline (ZOLOFT) tablet 150 mg  150 mg Oral Daily Ethelene Hal, NP   150 mg at 02/07/17 0813  . thiamine (VITAMIN B-1) tablet 100 mg  100 mg Oral Daily Ethelene Hal, NP   100 mg at 02/07/17 4628  . traZODone (DESYREL) tablet 100-150 mg  100-150 mg Oral QHS PRN Ethelene Hal, NP   100 mg at 02/06/17 2104    Lab Results:  Results for orders placed or performed during the hospital encounter of 02/04/17 (from the past 48 hour(s))  Basic metabolic panel     Status: Abnormal   Collection Time: 02/05/17  2:26 PM  Result Value Ref Range   Sodium 133 (L) 135 - 145 mmol/L   Potassium 3.3 (L) 3.5 - 5.1 mmol/L   Chloride 98 (L) 101 - 111 mmol/L   CO2 23 22 - 32 mmol/L   Glucose, Bld 323 (H) 65 - 99 mg/dL   BUN 12 6 - 20 mg/dL   Creatinine, Ser 0.95 0.44 - 1.00 mg/dL   Calcium 8.7 (L) 8.9 - 10.3 mg/dL   GFR calc non Af Amer >60 >60 mL/min   GFR calc Af Amer >60 >60 mL/min    Comment: (NOTE) The eGFR has been calculated using the CKD EPI equation. This calculation has not been validated in all clinical situations. eGFR's persistently <60 mL/min signify possible Chronic Kidney Disease.    Anion gap 12 5 - 15  CBC     Status: None   Collection Time: 02/05/17  2:26 PM  Result Value Ref Range   WBC 6.6 4.0 - 10.5 K/uL    RBC 3.94 3.87 - 5.11 MIL/uL   Hemoglobin 13.1 12.0 - 15.0 g/dL   HCT 38.8 36.0 - 46.0 %   MCV 98.5 78.0 - 100.0 fL   MCH 33.2 26.0 - 34.0 pg   MCHC 33.8 30.0 - 36.0 g/dL   RDW 14.8 11.5 - 15.5 %   Platelets 166 150 - 400 K/uL  Glucose, capillary     Status: Abnormal   Collection Time: 02/05/17  8:47 PM  Result Value Ref Range   Glucose-Capillary 190 (H) 65 - 99 mg/dL   Comment 1 Notify RN    Comment 2 Document in Chart   Glucose, capillary     Status: Abnormal   Collection Time: 02/06/17  5:52 AM  Result Value Ref Range   Glucose-Capillary 167 (H) 65 - 99 mg/dL   Comment 1 Notify RN   Hemoglobin A1c     Status: Abnormal   Collection Time: 02/06/17  6:04 AM  Result Value Ref Range   Hgb A1c MFr Bld 6.6 (H) 4.8 - 5.6 %    Comment: (NOTE)         Pre-diabetes: 5.7 - 6.4         Diabetes: >6.4         Glycemic control for adults with diabetes: <7.0    Mean Plasma Glucose 143 mg/dL    Comment: (NOTE) Performed At: [facility] [place], [place] [id number] Lindon Romp MD FB:[id number] Performed at [facility], [place] [address], [place], [place] [address]   Potassium     Status: Abnormal   Collection Time: 02/06/17  6:04 AM  Result Value Ref Range   Potassium 3.4 (L) 3.5 - 5.1 mmol/L    Comment: Performed at [facility], [place] [address] [place], [place] [address]    Blood Alcohol level:  Lab Results  Component Value Date   ETH 112 (H) 02/03/2017   ETH <5 84/13/2440    Metabolic Disorder Labs: Lab Results  Component Value Date   HGBA1C 6.6 (H) 02/06/2017   MPG 143 02/06/2017   No results found for: PROLACTIN Lab Results  Component Value Date   CHOL 125 09/16/2016   TRIG 171 (H) 09/16/2016   HDL 60 09/16/2016   CHOLHDL 2.1 09/16/2016   VLDL 34 09/16/2016   LDLCALC 31 09/16/2016    Physical Findings: AIMS: Facial and Oral Movements Muscles of Facial Expression: None, normal Lips and Perioral  Area: None, normal Jaw: None, normal Tongue: None, normal,Extremity Movements Upper (arms, wrists, hands, fingers): None, normal Lower (legs, knees, ankles, toes): None, normal, Trunk Movements Neck, shoulders, hips: None, normal, Overall Severity Severity of abnormal movements (highest score from questions above): None, normal Incapacitation due to abnormal movements: None, normal Patient's awareness of abnormal movements (rate only patient's report): No Awareness, Dental Status Current problems with teeth and/or dentures?: No Does patient usually wear dentures?: No  CIWA:  CIWA-Ar Total: 1 COWS:  COWS Total Score: 0  Musculoskeletal: Strength & Muscle Tone: within normal limits Gait & Station: normal Patient leans: N/A  Psychiatric Specialty Exam: Physical Exam  ROS denies chest pain,  No shortness of breath at room air, no vomiting , no fever or chills  Blood pressure (!) 142/93, pulse 97, temperature 98.3 F (36.8 C), temperature source Oral, resp. rate 16, height '5\' 4"'  (1.626 m), weight 88 kg (194 lb), SpO2 96 %.Body mass index is 33.3 kg/m.  General Appearance: Fairly Groomed  Eye Contact:  Good  Speech:  Normal Rate  Volume:  Normal  Mood:  depressed  Affect:  constricted, anxious, but reactive  Thought Process:  Linear  Orientation:  Full (Time, Place, and Person)  Thought Content:  denies hallucinations, no delusions, not internally preoccupied   Suicidal Thoughts:  Passive thoughts of death, but denies any plan or intention of hurting self or of suicide and contracts for safety on unit   Homicidal Thoughts:  No  Memory:  recent and remote grossly intact   Judgement:  Fair  Insight:  Fair  Psychomotor Activity:  Normal no current tremors, no diaphoresis, no psychomotor agitation   Concentration:  Concentration: Good and Attention Span: Good  Recall:  Good  Fund of Knowledge:  Good  Language:  Good  Akathisia:  Negative  Handed:  Right  AIMS (if indicated):      Assets:  Desire for Improvement Resilience  ADL's:  Intact  Cognition:  WNL  Sleep:  Number of Hours: 6.25   Assessment - patient remains depressed, endorses passive SI, but denies any current plan or intention. She is not presenting with significant symptoms of alcohol WDL at this time. She had been on Xanax and on Adderall prior to admission, but expresses understanding for rationale to minimize use, if possible,  of habit forming , potentially addictive medications  Patient has been on Zoloft for many years, denies side effects Has tolerated prior dose titrations well.   Treatment Plan Summary: Daily contact with patient to assess and evaluate symptoms and progress in treatment, Medication management, Plan inpatient admission  and medications as below Encourage group and milieu participation  to work on Radiographer, therapeutic and symptom reduction  Continue Ativan PRNs for potential alcohol withdrawal symptoms Increase Zoloft to100 mgrs BID for depression , anxiety Continue Abilify 5 mgrs QDAY for antidepressant augmentation  Continue Trazodone 100 mgrs QHS PRN for insomnia  Will consult hospitalist regarding management of hyperglycemia and antihypertensive adjustment if indicated  Treatment team working on disposition planning   Neita Garnet, MD 02/07/2017, 2:24 PM

## 2017-02-07 NOTE — Consult Note (Signed)
[facility]  Consult Note   Reason for consult: A1c 6.6, HTN   I have reviewed history and chart this patient. Patient with multiple comorbidities admitted to behavioral health for ongoing depression and suicidal ideas. On routine laboratories was found to have an A1c of 6.6 with previous glucose of 300's. Patient is asymptomatic. Mild elevated BP 140s/90s asymptomatic as well.  A/P New diagnosis of diabetes mellitus type 2 - , and start metformin 500 twice a day Low-carb diet, counseling on lifestyle modification. Weight loss.  Follow-up with PCP after discharge  Hypertension - slightly above goal, and on Cardizem and losartan. Will continue same medications and monitor BP. Slight elevation could be related to agitation or anxiety.  Follow up with PCP after discharge - if BP continues persistently above goal <140/90 would increase Cardizem to 180 mg daily   Thank for this consults. If you have any questions please call us back.   Latrelle DodrillEdwin Silva, MD  [facility]

## 2017-02-07 NOTE — Progress Notes (Signed)
D: Pt irritable on approach because she is not being prescribed Xanax and Adderall. Pt requested to signed 72 hour request for discharge form. Pt rates depression 8/10. Anxiety 8/10. Pt denies suicidal thoughts. Pt verbalized having palpitations this morning related to possible panic attacks. Writer assessed pt b/p and pulse. B/p 168/100 manually and heart rate 87. Will notify MD. Pt given vistaril for anxiety. Pt requesting lab results. Writer encouraged pt to speak with MD in regards to lab results.  A: Writer reviewed meds with pt. Las reviewed. Medications administered as ordered per MD. Verbal support provided. Pt encouraged to attend groups. Pt allowed to sign 72 hour request for discharge. 15 minute checks performed for safety. R: Pt non-receptive to tx at this time.

## 2017-02-07 NOTE — Progress Notes (Signed)
Pt signed 72 hour request for discharge form on 02/07/17 at 0815 am. Liborio NixonPatrice Liyat Faulkenberry, RN.

## 2017-02-07 NOTE — Tx Team (Signed)
Interdisciplinary Treatment and Diagnostic Plan Update  02/07/2017 Time of Session: 9:30am Brittney Cooper MRN: [id number]  Principal Diagnosis: MDD (major depressive disorder), recurrent severe, without psychosis (Brittney Cooper)  Secondary Diagnoses: Principal Problem:   MDD (major depressive disorder), recurrent severe, without psychosis (Brittney Cooper)   Current Medications:  Current Facility-Administered Medications  Medication Dose Route Frequency Provider Last Rate Last Dose  . acetaminophen (TYLENOL) tablet 650 mg  650 mg Oral Q6H PRN Ethelene Hal, NP   650 mg at 02/07/17 1416  . alum & mag hydroxide-simeth (MAALOX/MYLANTA) 200-200-20 MG/5ML suspension 30 mL  30 mL Oral Q4H PRN Ethelene Hal, NP      . ARIPiprazole (ABILIFY) tablet 5 mg  5 mg Oral Daily Kerrie Buffalo, NP   5 mg at 02/07/17 0814  . aspirin EC tablet 81 mg  81 mg Oral Daily Ethelene Hal, NP   81 mg at 02/07/17 0175  . atorvastatin (LIPITOR) tablet 40 mg  40 mg Oral q1800 Ethelene Hal, NP   40 mg at 02/06/17 1813  . diltiazem (CARDIZEM CD) 24 hr capsule 120 mg  120 mg Oral Daily Ethelene Hal, NP   120 mg at 02/07/17 0813  . folic acid (FOLVITE) tablet 1 mg  1 mg Oral Daily Ethelene Hal, NP   1 mg at 02/07/17 0813  . guaiFENesin (ROBITUSSIN) 100 MG/5ML solution 200 mg  10 mL Oral Q4H PRN Brittney Nunnery, NP   200 mg at 02/07/17 0817  . hydrOXYzine (ATARAX/VISTARIL) tablet 25 mg  25 mg Oral Q6H PRN Ethelene Hal, NP   25 mg at 02/07/17 1057  . loperamide (IMODIUM) capsule 2-4 mg  2-4 mg Oral PRN Ethelene Hal, NP      . Derrill Memo ON 02/08/2017] LORazepam (ATIVAN) tablet 1 mg  1 mg Oral Daily Ethelene Hal, NP      . LORazepam (ATIVAN) tablet 1 mg  1 mg Oral Q6H PRN Brittney Campus, MD      . losartan (COZAAR) tablet 100 mg  100 mg Oral Daily Ethelene Hal, NP   100 mg at 02/07/17 1025  . magnesium hydroxide (MILK OF MAGNESIA) suspension 30 mL  30 mL Oral Daily PRN Ethelene Hal, NP      . metFORMIN (GLUCOPHAGE) tablet 500 mg  500 mg Oral BID WC Brittney Lew, MD      . multivitamin with minerals tablet 1 tablet  1 tablet Oral Daily Ethelene Hal, NP   1 tablet at 02/07/17 [phone]  . nicotine (NICODERM CQ - dosed in mg/24 hours) patch 21 mg  21 mg Transdermal Daily Brittney Nunnery, NP   21 mg at 02/07/17 0814  . ondansetron (ZOFRAN-ODT) disintegrating tablet 4 mg  4 mg Oral Q6H PRN Ethelene Hal, NP      . sertraline (ZOLOFT) tablet 100 mg  100 mg Oral BID Brittney Campus, MD      . thiamine (VITAMIN B-1) tablet 100 mg  100 mg Oral Daily Ethelene Hal, NP   100 mg at 02/07/17 7824  . traZODone (DESYREL) tablet 100 mg  100 mg Oral QHS PRN Brittney Campus, MD        PTA Medications: Prescriptions Prior to Admission  Medication Sig Dispense Refill Last Dose  . acetaminophen (TYLENOL) 500 MG tablet Take 1,000 mg by mouth every 6 (six) hours as needed for mild pain.   02/02/2017 at Unknown time  . ALPRAZolam (  XANAX) 0.5 MG tablet Take 0.25 mg by mouth 5 (five) times daily as needed for anxiety.    02/02/2017 at Unknown time  . amphetamine-dextroamphetamine (ADDERALL) 20 MG tablet Take 20 mg by mouth 3 (three) times daily.   02/02/2017 at Unknown time  . aspirin EC 81 MG tablet Take 81 mg by mouth daily.   02/02/2017 at Unknown time  . atorvastatin (LIPITOR) 40 MG tablet Take 40 mg by mouth daily at 6 PM.    02/02/2017 at Unknown time  . Brexpiprazole (REXULTI) 0.5 MG TABS Take 0.5 mg by mouth daily.   02/02/2017 at Unknown time  . Brexpiprazole (REXULTI) 1 MG TABS Take 1 mg by mouth daily.   Not Taking at Unknown time  . diltiazem (CARDIZEM CD) 120 MG 24 hr capsule Take 1 capsule (120 mg total) by mouth daily. 90 capsule 3 02/02/2017 at Unknown time  . folic acid (FOLVITE) 1 MG tablet Take 1 tablet (1 mg total) by mouth daily. 30 tablet 0 02/02/2017 at Unknown time  . losartan (COZAAR) 100 MG tablet Take 100 mg by mouth daily.    02/02/2017 at Unknown  time  . Multiple Vitamin (MULTIVITAMIN WITH MINERALS) TABS tablet Take 1 tablet by mouth daily. 30 tablet 0 02/02/2017 at Unknown time  . sertraline (ZOLOFT) 100 MG tablet Take 150 mg by mouth daily.    02/02/2017 at Unknown time  . thiamine 100 MG tablet Take 1 tablet (100 mg total) by mouth daily. 30 tablet 0 02/02/2017 at Unknown time  . traZODone (DESYREL) 50 MG tablet Take 100-150 mg by mouth at bedtime as needed for sleep. For sleep.   02/02/2017 at Unknown time    Treatment Modalities: Medication Management, Group therapy, Case management,  1 to 1 session with clinician, Psychoeducation, Recreational therapy.  Patient Stressors: Financial difficulties Health problems Marital or family conflict  Patient Strengths: Capable of independent living Scientist, research (life sciences)  Physician Treatment Plan for Primary Diagnosis: MDD (major depressive disorder), recurrent severe, without psychosis (Brittney Cooper) Long Term Goal(s): Improvement in symptoms so as ready for discharge  Short Term Goals: Ability to identify changes in lifestyle to reduce recurrence of condition will improve Ability to verbalize feelings will improve Ability to disclose and discuss suicidal ideas Ability to demonstrate self-control will improve Ability to identify and develop effective coping behaviors will improve Ability to maintain clinical measurements within normal limits will improve Compliance with prescribed medications will improve Ability to identify triggers associated with substance abuse/mental health issues will improve Ability to identify changes in lifestyle to reduce recurrence of condition will improve Ability to verbalize feelings will improve Ability to disclose and discuss suicidal ideas Ability to demonstrate self-control will improve Ability to identify and develop effective coping behaviors will improve Ability to maintain clinical measurements within normal limits will improve Compliance with prescribed medications  will improve Ability to identify triggers associated with substance abuse/mental health issues will improve  Medication Management: Evaluate patient's response, side effects, and tolerance of medication regimen.  Therapeutic Interventions: 1 to 1 sessions, Unit Group sessions and Medication administration.  Evaluation of Outcomes: Not Met  Physician Treatment Plan for Secondary Diagnosis: Principal Problem:   MDD (major depressive disorder), recurrent severe, without psychosis ([place])   Long Term Goal(s): Improvement in symptoms so as ready for discharge  Short Term Goals: Ability to identify changes in lifestyle to reduce recurrence of condition will improve Ability to verbalize feelings will improve Ability to disclose and discuss suicidal ideas Ability to demonstrate self-control  will improve Ability to identify and develop effective coping behaviors will improve Ability to maintain clinical measurements within normal limits will improve Compliance with prescribed medications will improve Ability to identify triggers associated with substance abuse/mental health issues will improve Ability to identify changes in lifestyle to reduce recurrence of condition will improve Ability to verbalize feelings will improve Ability to disclose and discuss suicidal ideas Ability to demonstrate self-control will improve Ability to identify and develop effective coping behaviors will improve Ability to maintain clinical measurements within normal limits will improve Compliance with prescribed medications will improve Ability to identify triggers associated with substance abuse/mental health issues will improve  Medication Management: Evaluate patient's response, side effects, and tolerance of medication regimen.  Therapeutic Interventions: 1 to 1 sessions, Unit Group sessions and Medication administration.  Evaluation of Outcomes: Not Met   RN Treatment Plan for Primary Diagnosis: MDD (major  depressive disorder), recurrent severe, without psychosis ([place]) Long Term Goal(s): Knowledge of disease and therapeutic regimen to maintain health will improve  Short Term Goals: Ability to verbalize feelings will improve, Ability to disclose and discuss suicidal ideas and Ability to identify and develop effective coping behaviors will improve  Medication Management: RN will administer medications as ordered by provider, will assess and evaluate patient's response and provide education to patient for prescribed medication. RN will report any adverse and/or side effects to prescribing provider.  Therapeutic Interventions: 1 on 1 counseling sessions, Psychoeducation, Medication administration, Evaluate responses to treatment, Monitor vital signs and CBGs as ordered, Perform/monitor CIWA, COWS, AIMS and Fall Risk screenings as ordered, Perform wound care treatments as ordered.  Evaluation of Outcomes: Not Met   LCSW Treatment Plan for Primary Diagnosis: MDD (major depressive disorder), recurrent severe, without psychosis ([place]) Long Term Goal(s): Safe transition to appropriate next level of care at discharge, Engage patient in therapeutic group addressing interpersonal concerns.  Short Term Goals: Engage patient in aftercare planning with referrals and resources, Identify triggers associated with mental health/substance abuse issues and Increase skills for wellness and recovery  Therapeutic Interventions: Assess for all discharge needs, 1 to 1 time with Social worker, Explore available resources and support systems, Assess for adequacy in community support network, Educate family and significant other(s) on suicide prevention, Complete Psychosocial Assessment, Interpersonal group therapy.  Evaluation of Outcomes: Not Met   Progress in Treatment: Attending groups: Intermittently Participating in groups:Yes   Taking medication as prescribed: Yes, MD continues to assess for medication changes as  needed Toleration medication: Yes, no side effects reported at this time Family/Significant other contact made: No, CSW assessing for appropriate contact Patient understands diagnosis: Continuing to assess Discussing patient identified problems/goals with staff: Yes Medical problems stabilized or resolved: Yes Denies suicidal/homicidal ideation: No endorses passive SI Issues/concerns per patient self-inventory: None Other: N/A  New problem(s) identified: None identified at this time.   New Short Term/Long Term Goal(s): None identified at this time.   Discharge Plan or Barriers: Pt will return home and follow-up with outpatient services.   Reason for Continuation of Hospitalization: Anxiety Depression Medication stabilization Suicidal ideation  Estimated Length of Stay: 2-4 days  Attendees: Patient: 02/07/2017  4:10 PM  Physician: Dr. Parke Poisson 02/07/2017  4:10 PM  Nursing: Mayra Neer, RN; Darrol Angel, RN 02/07/2017  4:10 PM  RN Care Manager: Lars Pinks, RN 02/07/2017  4:10 PM  Social Worker: Adriana Reams, LCSW; Matthew Saras, [place] 02/07/2017  4:10 PM  Recreational Therapist:  02/07/2017  4:10 PM  Other: Lindell Spar, NP; Samuel Jester, NP  02/07/2017  4:10 PM  Other:  02/07/2017  4:10 PM  Other: 02/07/2017  4:10 PM    Scribe for Treatment Team: Gladstone Lighter, LCSW 02/07/2017 4:10 PM

## 2017-02-07 NOTE — Progress Notes (Signed)
Adult Psychoeducational Group Note  Date:  02/07/2017 Time:  10:40 PM  Group Topic/Focus:  Wrap-Up Group:   The focus of this group is to help patients review their daily goal of treatment and discuss progress on daily workbooks.  Participation Level:  Active  Participation Quality:  Appropriate  Affect:  Appropriate  Cognitive:  Alert  Insight: Appropriate  Engagement in Group:  Engaged  Modes of Intervention:  Discussion  Additional Comments:  Patient states, "I had a crappy day". Patient's goal was to make it through the day.  Zohaib Heeney L Miron Marxen 02/07/2017, 10:40 PM

## 2017-02-07 NOTE — [facility] Group Notes (Signed)
Adult Psychoeducational Group Note  Date:  02/07/2017  Time:  2:58 PM  Type of Therapy: The focus of this group is to help patients review their daily goal of treatment and discuss progress on daily workbooks.  Participation Level:  Did Not Attend  Participation Quality:  Did not attend.  Affect:  Did not attend.   Cognitive:  Did not attend.  Insight:  Did not attend.  Engagement in Group:  None  Modes of Intervention:  Did not attend.   Summary of Progress/Problems: Pt did not attend group.  Ellijah Leffel N Chamberlain Steinborn 02/07/2017, 2:58 PM

## 2017-02-08 LAB — GLUCOSE, CAPILLARY: Glucose-Capillary: 142 mg/dL — ABNORMAL HIGH (ref 65–99)

## 2017-02-08 MED ORDER — GABAPENTIN 100 MG PO CAPS
100.0000 mg | ORAL_CAPSULE | Freq: Three times a day (TID) | ORAL | Status: DC
Start: 2017-02-08 — End: 2017-02-09
  Administered 2017-02-08 – 2017-02-09 (×2): 100 mg via ORAL
  Filled 2017-02-08 (×5): qty 1

## 2017-02-08 MED ORDER — LORAZEPAM 0.5 MG PO TABS
0.5000 mg | ORAL_TABLET | Freq: Four times a day (QID) | ORAL | Status: DC | PRN
  Administered 2017-02-08 – 2017-02-09 (×3): 0.5 mg via ORAL
  Filled 2017-02-08 (×4): qty 1

## 2017-02-08 NOTE — Plan of Care (Signed)
Problem: Safety: Goal: Periods of time without injury will increase Outcome: Progressing Pt. remains a high fall risk, denies SI/HI/AVH at this time, Q 15 checks in place for safety.

## 2017-02-08 NOTE — [facility] Suicide Risk Assessment (Signed)
[facility] INPATIENT:  Family/Significant Other Suicide Prevention Education  Suicide Prevention Education:  Education Completed; Brittney Cooper (pt's sister) [phone], has been identified by the patient as the family member/significant other with whom the patient will be residing, and identified as the person(s) who will aid the patient in the event of a mental health crisis (suicidal ideations/suicide attempt).  With written consent from the patient, the family member/significant other has been provided the following suicide prevention education, prior to the and/or following the discharge of the patient.  The suicide prevention education provided includes the following:  Suicide risk factors  Suicide prevention and interventions  National Suicide Hotline telephone number  [facility] assessment telephone number  [facility] Assistance 911  [facility] and/or [facility]idential Mobile Crisis Unit telephone number  Request made of family/significant other to:  Remove weapons (e.g., guns, rifles, knives), all items previously/currently identified as safety concern.    Remove drugs/medications (over-the-counter, prescriptions, illicit drugs), all items previously/currently identified as a safety concern.  The family member/significant other verbalizes understanding of the suicide prevention education information provided.  The family member/significant other agrees to remove the items of safety concern listed above.  Brittney JordanLynn B Shifa Cooper 02/08/2017, 4:13 PM

## 2017-02-08 NOTE — [facility] Group Notes (Signed)
[facility] LCSW Group Therapy 02/08/2017 1:15 PM  Type of Therapy: Group Therapy- Feelings about Diagnosis  Pt arrived at the very end of group discussion.   Vernie ShanksLauren Wil Slape, LCSW 02/08/2017 4:30 PM

## 2017-02-08 NOTE — Plan of Care (Signed)
Problem: Health Behavior/Discharge Planning: Goal: Compliance with therapeutic regimen will improve Outcome: Progressing Patient taking medications as prescribed. Patient educated about medications and regimen has been reviewed with patient.

## 2017-02-08 NOTE — [facility] Group Notes (Signed)
[facility] Group Notes:  (Nursing/MHT/Case Management/Adjunct)  Date:  02/08/2017  Time:  0900 am  Type of Therapy:  Psychoeducational Skills  Participation Level:  Did Not Attend  Patient invited to group; declined to attend Vesta Mixeraroline B., RN 02/08/2017, 4:40 PM

## 2017-02-08 NOTE — Progress Notes (Addendum)
[facility] MD Progress Note  02/08/2017 11:44 AM Brittney Cooper  MRN:  [id number] Subjective:  Reports partial improvement and as she improves she is focusing more on discharge planning . Reports she had a good visit from family yesterday- parents are supportive , loving. Denies any suicidal ideations.  At this time does not endorse significant symptoms of withdrawal . Denies medication side effects. Ruminates about her physical health, which she states has tended to deteriorate, including recent diagnosis of DM.   Objective : I have discussed case with treatment team and have met with patient . Patient presenting with partial improvement of mood, range of affect. She reports ongoing significant anxiety, and states she has a long history of anxiety symptoms. She reports she understands rationale to avoid long term benzodiazepine management for anxiety- other options discussed. States medications such as Vistaril not helpful. In the past was on Neurontin, does not remember having had any side effects.  At this time remains future oriented, and denies any current suicidal ideations. Limited group/milieu participation, but states she has started going to more groups. No disruptive or agitated behaviors on unit. As above, reports partial, ongoing improvement and hopes to be ready for discharge soon. At present, tolerating medications well.  Principal Problem: MDD (major depressive disorder), recurrent severe, without psychosis ([place]) Diagnosis:   Patient Active Problem List   Diagnosis Date Noted  . MDD (major depressive disorder), recurrent severe, without psychosis ([place]) [F33.2] 02/04/2017  . Complete heart block ([place]) [I44.2]   . Palpitation [R00.2] 10/11/2016  . Palpitations [R00.2] 10/11/2016  . Acute pancreatitis [K85.90] 09/15/2016  . Alcohol abuse [F10.10] 09/15/2016  . ADHD (attention deficit hyperactivity disorder) [F90.9] 09/15/2016  . Tobacco use [Z72.0] 09/15/2016  . Anxiety [F41.9]  09/15/2016  . Hypokalemia [E87.6] 09/15/2016  . Pancreatitis [K85.90] 07/09/2016  . Alcoholic pancreatitis [U20.25] 07/08/2016  . Alcohol withdrawal ([place]) [F10.239] 07/08/2016  . Bipolar disorder ([place]) [F31.9] 07/08/2016  . Essential hypertension [I10] 07/08/2016  . Left shoulder pain [M25.512] 09/09/2014   Total Time spent with patient: 20 minutes   Past Medical History:  Past Medical History:  Diagnosis Date  . ADHD (attention deficit hyperactivity disorder)   . Alcoholic pancreatitis 42/70, 11/16, 7/17  . Anxiety   . Arthritis   . Bipolar disorder ([place])   . Chronic pain    back  . Depression   . Fatty liver   . Fracture of lateral malleolus of right fibula   . Hepatomegaly   . Hypertension   . OCD (obsessive compulsive disorder)   . Pancreatic pseudocyst   . Panic attack   . 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 Syncope    neurocardiogenic    Past Surgical History:  Procedure Laterality Date  . ABDOMINAL HYSTERECTOMY    . ANTERIOR AND POSTERIOR REPAIR    . BUNIONECTOMY Bilateral   . EP IMPLANTABLE DEVICE N/A 10/12/2016   Procedure: Pacemaker Implant;  Surgeon: Will Meredith Leeds, MD;  Location: [place] [facility];  Service: Cardiovascular;  Laterality: N/A;  . finfger surgery    . FINGER SURGERY    . LTCS    . MYRINGOTOMY    . ORIF ANKLE FRACTURE Right 07/25/2015   Procedure: OPEN REDUCTION INTERNAL FIXATION (ORIF) RIGHT ANKLE ;  Surgeon: Renette Butters, MD;  Location: [facility];  Service: Orthopedics;  Laterality: Right;  . PACEMAKER PLACEMENT    . RHINOPLASTY    .  TONSILLECTOMY     Family History:  Family History  Problem Relation Age of Onset  . Stroke Mother   . Kidney disease Mother   . Lung cancer Father     stage IV   . Stomach cancer Maternal Grandfather   . Colon cancer Paternal Grandfather    Social History:  History  Alcohol Use  . 0.0 oz/week     Comment: vodka daily      History  Drug Use No    Social History   Social History  . Marital status: Married    Spouse name: N/A  . Number of children: N/A  . Years of education: N/A   Occupational History  . RN    Social History Main Topics  . Smoking status: Current Every Day Smoker    Packs/day: 1.00    Years: 15.00    Types: Cigarettes  . Smokeless tobacco: Never Used  . Alcohol use 0.0 oz/week     Comment: vodka daily   . Drug use: No  . Sexual activity: Not Currently    Birth control/ protection: Surgical, Abstinence   Other Topics Concern  . None   Social History Narrative  . None   Additional Social History:    History of alcohol / drug use?: Yes  Sleep: improving   Appetite:  Improving   Current Medications: Current Facility-Administered Medications  Medication Dose Route Frequency Provider Last Rate Last Dose  . acetaminophen (TYLENOL) tablet 650 mg  650 mg Oral Q6H PRN Ethelene Hal, NP   650 mg at 02/08/17 [phone]  . alum & mag hydroxide-simeth (MAALOX/MYLANTA) 200-200-20 MG/5ML suspension 30 mL  30 mL Oral Q4H PRN Ethelene Hal, NP      . ARIPiprazole (ABILIFY) tablet 5 mg  5 mg Oral Daily Kerrie Buffalo, NP   5 mg at 02/08/17 0757  . aspirin EC tablet 81 mg  81 mg Oral Daily Ethelene Hal, NP   81 mg at 02/08/17 0757  . atorvastatin (LIPITOR) tablet 40 mg  40 mg Oral q1800 Ethelene Hal, NP   40 mg at 02/07/17 1703  . diltiazem (CARDIZEM CD) 24 hr capsule 120 mg  120 mg Oral Daily Ethelene Hal, NP   120 mg at 02/08/17 0757  . folic acid (FOLVITE) tablet 1 mg  1 mg Oral Daily Ethelene Hal, NP   1 mg at 02/08/17 0757  . guaiFENesin (ROBITUSSIN) 100 MG/5ML solution 200 mg  10 mL Oral Q4H PRN Rozetta Nunnery, NP   200 mg at 02/08/17 0802  . hydrOXYzine (ATARAX/VISTARIL) tablet 25 mg  25 mg Oral Q6H PRN Ethelene Hal, NP   25 mg at 02/08/17 1036  . losartan (COZAAR) tablet 100 mg  100 mg Oral Daily Ethelene Hal, NP   100 mg at 02/08/17 0757  . magnesium hydroxide (MILK OF MAGNESIA) suspension 30 mL  30 mL Oral Daily PRN Ethelene Hal, NP      . metFORMIN (GLUCOPHAGE) tablet 500 mg  500 mg Oral BID WC [name], MD   500 mg at 02/08/17 0757  . multivitamin with minerals tablet 1 tablet  1 tablet Oral Daily Ethelene Hal, NP   1 tablet at 02/08/17 0757  . nicotine (NICODERM CQ - dosed in mg/24 hours) patch 21 mg  21 mg Transdermal Daily Rozetta Nunnery, NP   21 mg at 02/07/17 0814  . sertraline (ZOLOFT) tablet 100 mg  100 mg  Oral BID Jenne Campus, MD   100 mg at 02/08/17 0757  . thiamine (VITAMIN B-1) tablet 100 mg  100 mg Oral Daily Ethelene Hal, NP   100 mg at 02/08/17 0757  . traZODone (DESYREL) tablet 100 mg  100 mg Oral QHS PRN Jenne Campus, MD   100 mg at 02/07/17 2127    Lab Results:  Results for orders placed or performed during the hospital encounter of 02/04/17 (from the past 48 hour(s))  Glucose, capillary     Status: Abnormal   Collection Time: 02/08/17  6:27 AM  Result Value Ref Range   Glucose-Capillary 142 (H) 65 - 99 mg/dL    Blood Alcohol level:  Lab Results  Component Value Date   ETH 112 (H) 02/03/2017   ETH <5 20/94/7096    Metabolic Disorder Labs: Lab Results  Component Value Date   HGBA1C 6.6 (H) 02/06/2017   MPG 143 02/06/2017   No results found for: PROLACTIN Lab Results  Component Value Date   CHOL 125 09/16/2016   TRIG 171 (H) 09/16/2016   HDL 60 09/16/2016   CHOLHDL 2.1 09/16/2016   VLDL 34 09/16/2016   LDLCALC 31 09/16/2016    Physical Findings: AIMS: Facial and Oral Movements Muscles of Facial Expression: None, normal Lips and Perioral Area: None, normal Jaw: None, normal Tongue: None, normal,Extremity Movements Upper (arms, wrists, hands, fingers): None, normal Lower (legs, knees, ankles, toes): None, normal, Trunk Movements Neck, shoulders, hips: None, normal, Overall Severity Severity of  abnormal movements (highest score from questions above): None, normal Incapacitation due to abnormal movements: None, normal Patient's awareness of abnormal movements (rate only patient's report): No Awareness, Dental Status Current problems with teeth and/or dentures?: No Does patient usually wear dentures?: No  CIWA:  CIWA-Ar Total: 4 COWS:  COWS Total Score: 0  Musculoskeletal: Strength & Muscle Tone: within normal limits- no tremors, no psychomotor restlessness or agitation Gait & Station: normal Patient leans: N/A  Psychiatric Specialty Exam: Physical Exam  ROS has had headaches, denies chest pain,  no shortness of breath at room air, no vomiting , no fever or chills  Blood pressure (!) 152/92, pulse 96, temperature 98.2 F (36.8 C), temperature source Oral, resp. rate 18, height '5\' 4"'  (1.626 m), weight 88 kg (194 lb), SpO2 96 %.Body mass index is 33.3 kg/m.  General Appearance: grooming is improved   Eye Contact:  Good  Speech:  Normal Rate  Volume:  Normal  Mood:  Less depressed   Affect:  More reactive   Thought Process:  Linear  Orientation:  Full (Time, Place, and Person)  Thought Content:  denies hallucinations, no delusions, not internally preoccupied   Suicidal Thoughts:  At this time denies suicidal or self injurious ideations  Homicidal Thoughts:  No  Memory:  recent and remote grossly intact   Judgement:  Improving   Insight:  Improving   Psychomotor Activity:  Normal no current tremors, no diaphoresis, no psychomotor agitation   Concentration:  Concentration: Good and Attention Span: Good  Recall:  Good  Fund of Knowledge:  Good  Language:  Good  Akathisia:  Negative  Handed:  Right  AIMS (if indicated):     Assets:  Desire for Improvement Resilience  ADL's:  Intact  Cognition:  WNL  Sleep:  Number of Hours: 6.5   Assessment - patient reports partial improvement compared to admission. At this time presenting with improving range of affect, denies any  current suicidal ideations. Thus far  tolerating psychiatric medications well . Not currently presenting with any withdrawal symptoms. Patient has history of cardiac pacemaker due to heart block - denies chest pain, dizziness. She is also diagnosed with DM II, which is new diagnosis for her. Ruminates about her health, but overall describes feeling motivated and optimistic about further improvement . Reports significant ongoing anxiety, expresses interest in Neurontin as a strategy to address, reports being on this medication in the past with no side effects   Treatment Plan Summary: Daily contact with patient to assess and evaluate symptoms and progress in treatment, Medication management, Plan inpatient admission  and medications as below Encourage group and milieu participation to work on coping skills and symptom reduction  Continue Ativan PRNs for potential alcohol withdrawal symptoms Continue  Zoloft 100 mgrs BID for depression , anxiety Start Neurontin 100 mgrs TID for anxiety, pain. Continue Abilify 5 mgrs QDAY for antidepressant augmentation  Continue Trazodone 100 mgrs QHS PRN for insomnia  Appreciate Hospitalist consultation - has been started on Metformin- patient denies side effects thus far. Treatment team working on disposition planning   Neita Garnet, MD 02/08/2017, 11:44 AM   Patient ID: Brittney Cooper, female   DOB: 1961/07/03, 56 y.o.   MRN: [id number]

## 2017-02-08 NOTE — Progress Notes (Signed)
Recreation Therapy Notes  Animal-Assisted Activity (AAA) Program Checklist/Progress Notes Patient Eligibility Criteria Checklist & Daily Group note for Rec TxIntervention  Date: 02.13.2018 Time: 2:45pm [address]    AAA/T Program Assumption of Risk Form signed by Patient/ or Parent Legal Guardian Yes  Patient is free of allergies or sever asthma Yes  Patient reports no fear of animals Yes  Patient reports no history of cruelty to animals Yes  Patient understands his/her participation is voluntary Yes  Patient washes hands before animal contact Yes  Patient washes hands after animal contact Yes  Behavioral Response: Appropriate   Education:Hand Washing, Appropriate Animal Interaction   Education Outcome: Acknowledges education.   Clinical Observations/Feedback: Patient attended session and interacted appropriately with therapy dog and peers.   Brittney Cooper, LRT/CTRS       Brittney Cooper L 02/08/2017 3:07 PM

## 2017-02-08 NOTE — Progress Notes (Signed)
Adult Psychoeducational Group Note  Date:  02/08/2017 Time:  9:04 PM  Group Topic/Focus:  Wrap-Up Group:   The focus of this group is to help patients review their daily goal of treatment and discuss progress on daily workbooks.  Participation Level:  Active  Participation Quality:  Appropriate  Affect:  Appropriate  Cognitive:  Alert  Insight: Appropriate  Engagement in Group:  Engaged  Modes of Intervention:  Discussion  Additional Comments:  Patient rates her day a 5-6. Patient states her day had been up and down. Patient's goal for today was to discharge. Patient will be leaving tomorrow.   Sanae Willetts L Ascencion Coye 02/08/2017, 9:04 PM

## 2017-02-08 NOTE — Progress Notes (Signed)
Nursing Progress Note 7p-7a  D) Patient presents pleasant and cooperative. Patient states her day was "stressful" but that she had a "great visit with my parents today". Patient states "these medication changes are difficult to handle". Patient denies SI/HI or AVH. Patient complains of a mild headache. Patient states "these headaches are weird, they have been coming and going". Patient contracts for safety at this time. Patient complains of a cough and requests medication stating "this cough medicine has done wonders to clear up my congestion".   A) Emotional support given. Patient medicated with orders as prescribed. Medications reviewed with patient. Patient on q15 min safety checks. Opportunities for questions or concerns presented to patient. Patient encouraged to continue to work on treatment goals. Biotroniks device for pacemaker given to patient upon sleep.  R) Patient receptive to interaction with nurse. Patient remains safe on the unit at this time. Patient is resting in bed without complaints. Will continue to monitor.

## 2017-02-08 NOTE — Progress Notes (Signed)
D: Patient denies SI/HI and A/V hallucinations; patient reports sleep is good; reports appetite is good; reports energy level is low ; reports ability to concentrate is good; rates depression as 6/10; rates hopelessness 5/10; rates anxiety as 5/10;   A: Monitored q 15 minutes; patient encouraged to attend groups; patient educated about medications; patient given medications per physician orders; patient encouraged to express feelings and/or concerns  R: Patient is very anxious; patient became tearful and wanted to leave because of her anxiety; patient given as needed medication; patient's interaction with staff and peers is minimal; patient was able to set goal to talk with staff 1:1 when having feelings of SI; patient is taking medications as prescribed and tolerating medications

## 2017-02-09 DIAGNOSIS — E1165 Type 2 diabetes mellitus with hyperglycemia: Secondary | ICD-10-CM

## 2017-02-09 DIAGNOSIS — Z720 Tobacco use: Secondary | ICD-10-CM

## 2017-02-09 DIAGNOSIS — E876 Hypokalemia: Secondary | ICD-10-CM

## 2017-02-09 DIAGNOSIS — I10 Essential (primary) hypertension: Secondary | ICD-10-CM

## 2017-02-09 LAB — GLUCOSE, CAPILLARY: GLUCOSE-CAPILLARY: 147 mg/dL — AB (ref 65–99)

## 2017-02-09 MED ORDER — DILTIAZEM HCL ER COATED BEADS 120 MG PO CP24: 120.0000 mg | ORAL_CAPSULE | Freq: Every day | ORAL | 0 refills | Status: DC

## 2017-02-09 MED ORDER — METFORMIN HCL 500 MG PO TABS: 500.0000 mg | ORAL_TABLET | Freq: Two times a day (BID) | ORAL | 0 refills | Status: DC

## 2017-02-09 MED ORDER — ARIPIPRAZOLE 5 MG PO TABS: 5.0000 mg | ORAL_TABLET | Freq: Every day | ORAL | 0 refills | Status: DC

## 2017-02-09 MED ORDER — ACETAMINOPHEN 500 MG PO TABS: 1000.0000 mg | ORAL_TABLET | Freq: Four times a day (QID) | ORAL | 0 refills | Status: DC | PRN

## 2017-02-09 MED ORDER — ADULT MULTIVITAMIN W/MINERALS CH: 1.0000 | ORAL_TABLET | Freq: Every day | ORAL | 0 refills | Status: DC

## 2017-02-09 MED ORDER — DILTIAZEM HCL ER COATED BEADS 180 MG PO CP24
180.0000 mg | ORAL_CAPSULE | Freq: Every day | ORAL | Status: DC
  Filled 2017-02-09: qty 1

## 2017-02-09 MED ORDER — SERTRALINE HCL 100 MG PO TABS: 100.0000 mg | ORAL_TABLET | Freq: Two times a day (BID) | ORAL | 0 refills | Status: DC

## 2017-02-09 MED ORDER — LOSARTAN POTASSIUM 100 MG PO TABS: 100.0000 mg | ORAL_TABLET | Freq: Every day | ORAL | Status: DC

## 2017-02-09 MED ORDER — FOLIC ACID 1 MG PO TABS: 1.0000 mg | ORAL_TABLET | Freq: Every day | ORAL | 0 refills | Status: DC

## 2017-02-09 MED ORDER — DILTIAZEM HCL ER COATED BEADS 180 MG PO CP24: 180.0000 mg | ORAL_CAPSULE | Freq: Every day | ORAL | 0 refills | Status: DC

## 2017-02-09 MED ORDER — HYDROXYZINE HCL 25 MG PO TABS: 25.0000 mg | ORAL_TABLET | Freq: Four times a day (QID) | ORAL | 0 refills | Status: DC | PRN

## 2017-02-09 MED ORDER — TRAZODONE HCL 100 MG PO TABS: 100.0000 mg | ORAL_TABLET | Freq: Every evening | ORAL | 0 refills | Status: DC | PRN

## 2017-02-09 MED ORDER — GABAPENTIN 100 MG PO CAPS: 100.0000 mg | ORAL_CAPSULE | Freq: Three times a day (TID) | ORAL | 0 refills | Status: DC

## 2017-02-09 MED ORDER — ATORVASTATIN CALCIUM 40 MG PO TABS: 40.0000 mg | ORAL_TABLET | Freq: Every day | ORAL | Status: DC

## 2017-02-09 MED ORDER — THIAMINE HCL 100 MG PO TABS: 100.0000 mg | ORAL_TABLET | Freq: Every day | ORAL | 0 refills | Status: DC

## 2017-02-09 MED ORDER — NICOTINE 21 MG/24HR TD PT24: 21.0000 mg | MEDICATED_PATCH | Freq: Every day | TRANSDERMAL | 0 refills | Status: DC

## 2017-02-09 MED ORDER — ASPIRIN EC 81 MG PO TBEC: 81.0000 mg | DELAYED_RELEASE_TABLET | Freq: Every day | ORAL | Status: DC

## 2017-02-09 MED ORDER — LORAZEPAM 0.5 MG PO TABS: 0.5000 mg | ORAL_TABLET | Freq: Four times a day (QID) | ORAL | 0 refills | Status: DC | PRN

## 2017-02-09 NOTE — Progress Notes (Signed)
  DATA ACTION RESPONSE  Objective- Pt. is up and visible in the milieu, interacting with peers & staff approprietly. Pt. presents with a depressed/anxious affect and mood. Subjective- Denies having any SI/HI/AVH/Pain at this time. Pt. states " I think I am going home tomorrow, I just hope I don't go back to the same bad habits". Therapeutic advice and encouragement given to Pt. Pt. was thankful for interaction and time spent.  Pt. continues to be cooperative and remain safe & pleasant on the unit.  1:1 interaction in private to establish rapport. Encouragement, education, & support given from staff. Meds. ordered and administered. PRN Ativan, Trazodone, and Robitussin requested and will re-eval accordingly.   Safety maintained with Q 15 checks. Continues to follow treatment plan and will monitor closely. No additonal questions/concerns noted.

## 2017-02-09 NOTE — Progress Notes (Signed)
Patient discharged per physician order; patient denies SI/HI and A/V hallucinations; patient received prescriptions, samples, AVS,  suicide risk assessment note, and transition record given to the patient after it was reviewed; patient had no other questions or concerns at this time; patient verbalized and signed that all belongings were returned; patient's charger for her pacemaker was also returned to the patient; patient left the unit ambulatory

## 2017-02-09 NOTE — Discharge Summary (Signed)
Physician Discharge Summary Note  Patient:  Brittney Cooper is an 56 y.o., female MRN:  [id number] DOB:  03/26/1961 Patient phone:  [phone] (home)  Patient address:   [address] [place] [id number],  Total Time spent with patient: Greater than 30 minutes  Date of Admission:  02/04/2017  Date of Discharge: 02-09-17  Reason for Admission: Worsening symptoms of depression triggering suicidal ideations.  Principal Problem: MDD (major depressive disorder), recurrent severe, without psychosis [facility](HCC)  Discharge Diagnoses: Patient Active Problem List   Diagnosis Date Noted  . Type 2 diabetes mellitus with hyperglycemia, without long-term current use of insulin (HCC) [E11.65] 02/09/2017  . MDD (major depressive disorder), recurrent severe, without psychosis (HCC) [F33.2] 02/04/2017  . Complete heart block (HCC) [I44.2]   . Palpitation [R00.2] 10/11/2016  . Palpitations [R00.2] 10/11/2016  . Acute pancreatitis [K85.90] 09/15/2016  . Alcohol abuse [F10.10] 09/15/2016  . ADHD (attention deficit hyperactivity disorder) [F90.9] 09/15/2016  . Tobacco use [Z72.0] 09/15/2016  . Anxiety [F41.9] 09/15/2016  . Hypokalemia [E87.6] 09/15/2016  . Pancreatitis [K85.90] 07/09/2016  . Alcoholic pancreatitis [K85.20] 07/08/2016  . Alcohol withdrawal (HCC) [F10.239] 07/08/2016  . Bipolar disorder (HCC) [F31.9] 07/08/2016  . Essential hypertension [I10] 07/08/2016  . Left shoulder pain [M25.512] 09/09/2014   Past Psychiatric History: MDD, ADHD, Bipolar disorder.  Past Medical History:  Past Medical History:  Diagnosis Date  . ADHD (attention deficit hyperactivity disorder)   . 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 Anxiety   . Arthritis   . Bipolar disorder (HCC)   . Chronic pain    back  . Depression   . Fatty liver   . Fracture of lateral malleolus of right fibula   . Hepatomegaly   . Hypertension   . OCD (obsessive compulsive disorder)   . Pancreatic pseudocyst   .  Panic attack   . 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 Syncope    neurocardiogenic    Past Surgical History:  Procedure Laterality Date  . ABDOMINAL HYSTERECTOMY    . ANTERIOR AND POSTERIOR REPAIR    . BUNIONECTOMY Bilateral   . EP IMPLANTABLE DEVICE N/A 10/12/2016   Procedure: Pacemaker Implant;  Surgeon: Will Jorja LoaMartin Camnitz, MD;  Location: [facility];  Service: Cardiovascular;  Laterality: N/A;  . finfger surgery    . FINGER SURGERY    . LTCS    . MYRINGOTOMY    . ORIF ANKLE FRACTURE Right 07/25/2015   Procedure: OPEN REDUCTION INTERNAL FIXATION (ORIF) RIGHT ANKLE ;  Surgeon: Sheral Apleyimothy D Murphy, MD;  Location: [facility];  Service: Orthopedics;  Laterality: Right;  . PACEMAKER PLACEMENT    . RHINOPLASTY    . TONSILLECTOMY     Family History:  Family History  Problem Relation Age of Onset  . Stroke Mother   . Kidney disease Mother   . Lung cancer Father     stage IV   . Stomach cancer Maternal Grandfather   . Colon cancer Paternal Grandfather    Family Psychiatric  History: See H&P  Social History:  History  Alcohol Use  . 0.0 oz/week    Comment: vodka daily      History  Drug Use No    Social History   Social History  . Marital status: Married    Spouse name: N/A  . Number of children: N/A  . Years of education: N/A  Occupational History  . RN    Social History Main Topics  . Smoking status: Current Every Day Smoker    Packs/day: 1.00    Years: 15.00    Types: Cigarettes  . Smokeless tobacco: Never Used  . Alcohol use 0.0 oz/week     Comment: vodka daily   . Drug use: No  . Sexual activity: Not Currently    Birth control/ protection: Surgical, Abstinence   Other Topics Concern  . None   Social History Narrative  . None   Hospital Course: Brittney Cooper a 56 y.o.female who upon admission at the ED was on a 2 week self-neglect with ADLs & personal hygiene.   Furthermore, had thoughts of suicide, "not wanting to be alive."  Pt denies HI and AVH. Pt reports the following stressors: recent heart sugrery, father has stage IV cancer, mother is sick, and work issues.   Patient states she was currently on a medical leave from work due to her depression but has returned to work.  Patient states she has a therapist and psychiatrist and sees them on a regular basis Q 2 months. Patient states she is currently prescribed Xanax, Zoloft, and Adderall.Patient is symptomatic and fell.  Hypotensive at 88/68. She has significant cardiac history, hx of syncopal episodes and was eventually diagnosed with total heart block, a pacer was placed. Between this recent major health change, caring for elderly parents, patient felt helpless.    Brittney Cooper was admitted to the [facility] adult unit for complaints of worsening symptoms of depression & suicidal ideations. She reported on admission at the ED, the feeling of not wanting to live any mores. She cited personal/family medical issues as the trigger. She was in need of mood stabilization treatments. Her BAL on admission was 112. However, she was not presenting with any substance withdrawal symptoms.   While a patient here at the [facility], Brittney Cooper was placed on medication regimen for her presenting symptoms. She was medicated & discharged on; Abilify 5 mg for mood control, Gabapentin 100 mg for agitations, Lorazepam 0.5 mg prn for anxiety, Nicotine patch 21 mg for smoking cessation, Sertraline 100 mg for depression & Trazodone 100 mg for insomnia. She received no detoxification treatment as she was not presenting with any substance withdrawal symptoms. She was instructed & encouraged upon discharge to confront the fact that she may have drinking problems & should deal with the problem appropriately by considering substance abuse treatment.   Besides the mood stabilization treatments, Brittney Cooper was enrolled & participated in the scheduled group therapy and  activities being held on this unit. She learned coping skills that should help her after discharge to cope better & manage her depression more effectively. She presented other pre-existing medical issues that required treatments & monitoring. Gwendelyn was resumed on all her pertinent home medications for those health issues. She tolerated her treatment regimen without any adverse effects or reactions.   Jasimine's symptoms responded well to her treatment regimen. This is evidenced by her presentation on daily basis an improved mood, decreased anxiety levels & absence of suicidal ideations. Subsequent evaluations revealed that Chelcea is both mentally & medically stable to be discharged to continue mental health care on outpatient basis. She is discharged to her home with family to follow-up care as noted below. She was discharged with all personal belongings in no apparent distress. Transportation per family.  Physical Findings: AIMS: Facial and Oral Movements Muscles of Facial Expression: None, normal Lips and Perioral Area: None, normal Jaw: None, normal  Tongue: None, normal,Extremity Movements Upper (arms, wrists, hands, fingers): None, normal Lower (legs, knees, ankles, toes): None, normal, Trunk Movements Neck, shoulders, hips: None, normal, Overall Severity Severity of abnormal movements (highest score from questions above): None, normal Incapacitation due to abnormal movements: None, normal Patient's awareness of abnormal movements (rate only patient's report): No Awareness, Dental Status Current problems with teeth and/or dentures?: No Does patient usually wear dentures?: No  CIWA:  CIWA-Ar Total: 3 COWS:  COWS Total Score: 0  Musculoskeletal: Strength & Muscle Tone: within normal limits Gait & Station: normal Patient leans: N/A  Psychiatric Specialty Exam: Physical Exam  Constitutional: She appears well-developed.  HENT:  Head: Normocephalic.  Eyes: Pupils are equal, round, and  reactive to light.  Neck: Normal range of motion.  Cardiovascular:  Elevated blood pressure  Respiratory: Effort normal.  GI: Soft.  Genitourinary:  Genitourinary Comments: Denies any issues in this area.  Musculoskeletal: Normal range of motion.  Neurological: She is alert.  Skin: Skin is warm.    Review of Systems  Constitutional: Negative.   Eyes: Negative.   Respiratory: Negative.   Cardiovascular: Negative.   Gastrointestinal: Negative.   Genitourinary: Negative.   Musculoskeletal: Negative.   Skin: Negative.   Neurological: Negative.   Endo/Heme/Allergies: Negative.   Psychiatric/Behavioral: Positive for depression (Stable) and substance abuse (Hx. alcoholism,). Negative for hallucinations, memory loss and suicidal ideas. The patient has insomnia (Stable). The patient is not nervous/anxious.     Blood pressure (!) 142/102, pulse 95, temperature 98.8 F (37.1 C), temperature source Oral, resp. rate 18, height 5\' 4"  (1.626 m), weight 88 kg (194 lb), SpO2 96 %.Body mass index is 33.3 kg/m.  See Md's SRA   Have you used any form of tobacco in the last 30 days? (Cigarettes, Smokeless Tobacco, Cigars, and/or Pipes): Yes  Has this patient used any form of tobacco in the last 30 days? (Cigarettes, Smokeless Tobacco, Cigars, and/or Pipes): Yes, provided with Nicotine patch prescription.  Blood Alcohol level:  Lab Results  Component Value Date   ETH 112 (H) 02/03/2017   ETH <5 12/05/2016   Metabolic Disorder Labs:  Lab Results  Component Value Date   HGBA1C 6.6 (H) 02/06/2017   MPG 143 02/06/2017   No results found for: PROLACTIN Lab Results  Component Value Date   CHOL 125 09/16/2016   TRIG 171 (H) 09/16/2016   HDL 60 09/16/2016   CHOLHDL 2.1 09/16/2016   VLDL 34 09/16/2016   LDLCALC 31 09/16/2016   See Psychiatric Specialty Exam and Suicide Risk Assessment completed by Attending Physician prior to discharge.  Discharge destination:  Home  Is patient on  multiple antipsychotic therapies at discharge:  No   Has Patient had three or more failed trials of antipsychotic monotherapy by history:  No  Recommended Plan for Multiple Antipsychotic Therapies: NA   Allergies as of 02/09/2017      Reactions   Codeine Nausea And Vomiting   Demerol [meperidine] Other (See Comments)   Hallucinations      Medication List    STOP taking these medications   ALPRAZolam 0.5 MG tablet Commonly known as:  XANAX   amphetamine-dextroamphetamine 20 MG tablet Commonly known as:  ADDERALL   REXULTI 0.5 MG Tabs Generic drug:  Brexpiprazole   REXULTI 1 MG Tabs Generic drug:  Brexpiprazole     TAKE these medications     Indication  acetaminophen 500 MG tablet Commonly known as:  TYLENOL Take 2 tablets (1,000 mg total) by mouth every  6 (six) hours as needed for mild pain.  Indication:  Fever, Pain   ARIPiprazole 5 MG tablet Commonly known as:  ABILIFY Take 1 tablet (5 mg total) by mouth daily. For mood control Start taking on:  02/10/2017  Indication:  MOOD control   aspirin EC 81 MG tablet Take 1 tablet (81 mg total) by mouth daily. For heart health What changed:  additional instructions  Indication:  Heart heart   atorvastatin 40 MG tablet Commonly known as:  LIPITOR Take 1 tablet (40 mg total) by mouth daily at 6 PM. For high cholesterol What changed:  additional instructions  Indication:  Inherited Heterozygous Hypercholesterolemia   diltiazem 180 MG 24 hr capsule Commonly known as:  CARDIZEM CD Take 1 capsule (180 mg total) by mouth daily. For high blood pressure Start taking on:  02/10/2017 What changed:  medication strength  how much to take  additional instructions  Indication:  High Blood Pressure Disorder   folic acid 1 MG tablet Commonly known as:  FOLVITE Take 1 tablet (1 mg total) by mouth daily. For folic acid replacement What changed:  additional instructions  Indication:  Anemia From Inadequate Folic Acid    gabapentin 100 MG capsule Commonly known as:  NEURONTIN Take 1 capsule (100 mg total) by mouth 3 (three) times daily. For agitation  Indication:  Agitation   hydrOXYzine 25 MG tablet Commonly known as:  ATARAX/VISTARIL Take 1 tablet (25 mg total) by mouth every 6 (six) hours as needed for anxiety.  Indication:  Anxiety Neurosis   LORazepam 0.5 MG tablet Commonly known as:  ATIVAN Take 1 tablet (0.5 mg total) by mouth every 6 (six) hours as needed for anxiety.  Indication:  Feeling Anxious   losartan 100 MG tablet Commonly known as:  COZAAR Take 1 tablet (100 mg total) by mouth daily. For high blood pressure What changed:  additional instructions  Indication:  High Blood Pressure Disorder   metFORMIN 500 MG tablet Commonly known as:  GLUCOPHAGE Take 1 tablet (500 mg total) by mouth 2 (two) times daily with a meal. For diabetes management  Indication:  Type 2 Diabetes   multivitamin with minerals Tabs tablet Take 1 tablet by mouth daily. For low Vitamin replacement What changed:  additional instructions  Indication:  Vitamin supplement   nicotine 21 mg/24hr patch Commonly known as:  NICODERM CQ - dosed in mg/24 hours Place 1 patch (21 mg total) onto the skin daily. For smoking cessation Start taking on:  02/10/2017  Indication:  Nicotine Addiction   sertraline 100 MG tablet Commonly known as:  ZOLOFT Take 1 tablet (100 mg total) by mouth 2 (two) times daily. For depression What changed:  how much to take  when to take this  additional instructions  Indication:  Major Depressive Disorder   thiamine 100 MG tablet Take 1 tablet (100 mg total) by mouth daily. For low thiamine What changed:  additional instructions  Indication:  Deficiency in Thiamine or Vitamin B1   traZODone 100 MG tablet Commonly known as:  DESYREL Take 1 tablet (100 mg total) by mouth at bedtime as needed for sleep. What changed:  medication strength  how much to take  additional  instructions  Indication:  Trouble Sleeping      Follow-up Information    VELAZQUEZ,[name], MD Follow up.   Specialty:  Internal Medicine Why:  Please Call & see your outpatient primary care provider as soon as possible for Diabetes & High blood pressure conditions. Contact information:  [address] [place] [address] [phone]        [facility]. Call.   Specialty:  Behavioral Health Why:  Social worker has left two messages and is unable to establish an appointment at this time. Please call and make an appointment within 1-3 days of discharge to see Dr. Phillip Heal for medication management and Eldridge Dace, LCSW, for therapy. Contact information: [address] [place] [address] [phone]          Follow-up recommendations:  Activity:  As tolerated Diet: As recommended by your primary care doctor. Keep all scheduled follow-up appointments as recommended.  Comments: Patient is instructed prior to discharge to: Take all medications as prescribed by his/her mental healthcare provider. Report any adverse effects and or reactions from the medicines to his/her outpatient provider promptly. Patient has been instructed & cautioned: To not engage in alcohol and or illegal drug use while on prescription medicines. In the event of worsening symptoms, patient is instructed to call the crisis hotline, 911 and or go to the nearest ED for appropriate evaluation and treatment of symptoms. To follow-up with his/her primary care provider for your other medical issues, concerns and or health care needs.   Signed: Sanjuana Kava, NP, PMHNP, FNP-BC 02/09/2017, 11:09 AM

## 2017-02-09 NOTE — Consult Note (Addendum)
Medical Consultation  Brittney Cooper XBM:[id number] DOB: Jan 29, 1961 DOA: 02/04/2017 PCP: [name], MD   Requesting physician: Dr. Jama Flavors Reason for consultation: DM and HTN  Impression/Recommendations Diabetes mellitus type 2 -The patient had CBGs 300s -started on metformin 500mg  bid on 02/07/17 -CBGs improving since then into 140-150 range -The patient can be discharged home with metformin 500 mg twice a day  -She will need a glucometer, lancets, and test strips  -The patient was instructed to check her CBGs once daily for the next 2-3 weeks and keep her glycemic long with which she will take to her primary care provider  -02/06/2017 hemoglobin A1c 6.6  Essential Hypertension  -Given the patient's history of syncope secondary to dysautonomia, will have to tolerate higher BP -However, has had multiple BPs 150-160/100-110 -increase dilitiazem CD to 180mg  daily -if patient experiences increasing dizziness and/or syncope, her dose may need to decrease back to her baseline 120 mg dose -Follow up with PCP in 1 week for blood pressure check -Metoprolol succinate was discontinued secondary to nightmares -Continue losartan  Complete heart block -Status post permanent pacemaker 10/12/2016 -Patient follows Dr. Graciela Husbands  Bipolar disorder -Per psychiatry  Hyperlipidemia -Continue statin  Tobacco abuse -tobacco cessation discussed  Alcoholic pancreatitis history -No symptoms of withdrawal    HPI:  Brittney Cooper is an 56 y.o. female with a history of Hypertension, diabetes mellitus, tobacco abuse, alcoholic pancreatitis, bipolar disorder was [facility] to [facility] on 02/04/2017 for depression. Her initial screening labs suggested hyperglycemia with serum glucose of 323. Internal medicine consultation was obtained at that time. Hemoglobin A1c was noted to be 6.6 on 02/06/2017. The patient was started on metformin 500 mg twice a day on 02/07/2017. In addition, the  patient's blood pressure remained elevated. As a result, internal medicine consultation was obtained for further management.  Patient denies fevers, chills, headache, chest pain, dyspnea, nausea, vomiting, diarrhea, abdominal pain, dysuria, hematuria, hematochezia, and melena. The patient has a history of complete heart block status post permanent pacemaker 10/12/2016. In addition she has this autonomic syncope. Her HCTZ was discontinued on 01/11/2017 by Dr. Graciela Husbands. During [facility] at [facility], the patient's blood pressure has been consistently 150-160s/100-110.  Since metformin was started, her CBGs have improved into the 140-150 range.  Previous labs and medical history has been reviewed and summarized.    Past Medical History:  Diagnosis Date  . ADHD (attention deficit hyperactivity disorder)   . 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 Anxiety   . Arthritis   . Bipolar disorder (HCC)   . Chronic pain    back  . Depression   . Fatty liver   . Fracture of lateral malleolus of right fibula   . Hepatomegaly   . Hypertension   . OCD (obsessive compulsive disorder)   . Pancreatic pseudocyst   . Panic attack   . 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 Syncope    neurocardiogenic   Past Surgical History:  Procedure Laterality Date  . ABDOMINAL HYSTERECTOMY    . ANTERIOR AND POSTERIOR REPAIR    . BUNIONECTOMY Bilateral   . EP IMPLANTABLE DEVICE N/A 10/12/2016   Procedure: Pacemaker Implant;  Surgeon: Will Jorja Loa, MD;  Location: [facility] INVASIVE CV [facility];  Service: Cardiovascular;  Laterality: N/A;  . finfger surgery    . FINGER SURGERY    . LTCS    . MYRINGOTOMY    .  ORIF ANKLE FRACTURE Right 07/25/2015   Procedure: OPEN REDUCTION INTERNAL FIXATION (ORIF) RIGHT ANKLE ;  Surgeon: Sheral Apleyimothy D Murphy, MD;  Location: [facility];  Service: Orthopedics;  Laterality: Right;  . PACEMAKER PLACEMENT    .  RHINOPLASTY    . TONSILLECTOMY     Social History:  reports that she has been smoking Cigarettes.  She has a 15.00 pack-year smoking history. She has never used smokeless tobacco. She reports that she drinks alcohol. She reports that she does not use drugs.  Family History  Problem Relation Age of Onset  . Stroke Mother   . Kidney disease Mother   . Lung cancer Father     stage IV   . Stomach cancer Maternal Grandfather   . Colon cancer Paternal Grandfather     Allergies  Allergen Reactions  . Codeine Nausea And Vomiting  . Demerol [Meperidine] Other (See Comments)    Hallucinations      Prior to Admission medications   Medication Sig Start Date End Date Taking? Authorizing Provider  acetaminophen (TYLENOL) 500 MG tablet Take 1,000 mg by mouth every 6 (six) hours as needed for mild pain.    Historical Provider, MD  ALPRAZolam Prudy Feeler(XANAX) 0.5 MG tablet Take 0.25 mg by mouth 5 (five) times daily as needed for anxiety.     Historical Provider, MD  amphetamine-dextroamphetamine (ADDERALL) 20 MG tablet Take 20 mg by mouth 3 (three) times daily.    Historical Provider, MD  aspirin EC 81 MG tablet Take 81 mg by mouth daily.    Historical Provider, MD  atorvastatin (LIPITOR) 40 MG tablet Take 40 mg by mouth daily at 6 PM.     Historical Provider, MD  Brexpiprazole (REXULTI) 0.5 MG TABS Take 0.5 mg by mouth daily.    Historical Provider, MD  Brexpiprazole (REXULTI) 1 MG TABS Take 1 mg by mouth daily.    Historical Provider, MD  diltiazem (CARDIZEM CD) 120 MG 24 hr capsule Take 1 capsule (120 mg total) by mouth daily. 01/11/17 04/11/17  Duke SalviaSteven C Klein, MD  folic acid (FOLVITE) 1 MG tablet Take 1 tablet (1 mg total) by mouth daily. 09/19/16   Mir Vergie LivingMohammed Ikramullah, MD  losartan (COZAAR) 100 MG tablet Take 100 mg by mouth daily.  06/12/14   Historical Provider, MD  Multiple Vitamin (MULTIVITAMIN WITH MINERALS) TABS tablet Take 1 tablet by mouth daily. 09/19/16   Mir Vergie LivingMohammed Ikramullah, MD    sertraline (ZOLOFT) 100 MG tablet Take 150 mg by mouth daily.     Historical Provider, MD  thiamine 100 MG tablet Take 1 tablet (100 mg total) by mouth daily. 09/19/16   Mir Vergie LivingMohammed Ikramullah, MD  traZODone (DESYREL) 50 MG tablet Take 100-150 mg by mouth at bedtime as needed for sleep. For sleep. 06/30/16   Historical Provider, MD    Inpatient Medications:   Scheduled Meds: . ARIPiprazole  5 mg Oral Daily  . aspirin EC  81 mg Oral Daily  . atorvastatin  40 mg Oral q1800  . [START ON 02/10/2017] diltiazem  180 mg Oral Daily  . folic acid  1 mg Oral Daily  . gabapentin  100 mg Oral TID  . losartan  100 mg Oral Daily  . metFORMIN  500 mg Oral BID WC  . multivitamin with minerals  1 tablet Oral Daily  . nicotine  21 mg Transdermal Daily  . sertraline  100 mg Oral BID  . thiamine  100 mg Oral Daily   Continuous  Infusions:   Review of Systems:  Constitutional:  No weight loss, night sweats, Fevers, chills, fatigue.  Head&Eyes: No headache.  No vision loss.  No eye pain or scotoma ENT:  No Difficulty swallowing,Tooth/dental problems,Sore throat,  No ear ache  Cardio-vascular:  No chest pain, Orthopnea, PND, swelling in lower extremities,   palpitations  GI:  No heartburn,  abdominal pain, nausea, vomiting, diarrhea, loss of appetite, hematochezia, melena Resp:  No shortness of breath with exertion or at rest. no productive cough,  No coughing up of blood.No wheezing.No chest wall deformity  Skin:  no rash or lesions.  GU:  no dysuria, no hematuria, no urgency or frequency. No flank pain.  Musculoskeletal:  No joint pain or swelling. No decreased range of motion. No back pain.  Psych:  No change in mood or affect.  Neurologic: No headache, no dysesthesia, no focal weakness, no vision loss. No syncope    Physical Exam: Vitals:   02/09/17 0700 02/09/17 0701 02/09/17 0940 02/09/17 0941  BP: (!) 148/104 (!) 148/108 (!) 139/105 (!) 140/95  Pulse:   92 95  Resp:      Temp:       TempSrc:      SpO2:      Weight:      Height:       General:  A&O x 3, NAD, nontoxic, pleasant/cooperative Head/Eye: No conjunctival hemorrhage, no icterus, Comfrey/AT, No nystagmus ENT:  No icterus,  No thrush, good dentition, no pharyngeal exudate Neck:  No masses, no lymphadenpathy, no bruits CV:  RRR, no rub, no gallop, no S3 Lung:  CTAB, good air movement, no wheeze, no rhonchi Abdomen: soft/NT, +BS, nondistended, no peritoneal signs Ext: No cyanosis, No rashes, No petechiae, No lymphangitis, No edema Neuro: CNII-XII intact, strength 4/5 in bilateral upper and lower extremities, no dysmetria  Labs on Admission:  Basic Metabolic Panel:  Recent Labs Lab 02/03/17 1047 02/05/17 1426 02/06/17 0604  NA 136 133*  --   K 3.9 3.3* 3.4*  CL 98* 98*  --   CO2 17* 23  --   GLUCOSE 156* 323*  --   BUN 18 12  --   CREATININE 0.79 0.95  --   CALCIUM 9.4 8.7*  --   MG 2.1  --   --    Liver Function Tests:  Recent Labs Lab 02/03/17 1047  AST 80*  ALT 54  ALKPHOS 101  BILITOT 1.0  PROT 7.3  ALBUMIN 4.3    Recent Labs Lab 02/03/17 1047  LIPASE 73*   No results for input(s): AMMONIA in the last 168 hours. CBC:  Recent Labs Lab 02/03/17 1047 02/05/17 1426  WBC 9.4 6.6  NEUTROABS 6.8  --   HGB 16.3* 13.1  HCT 46.2* 38.8  MCV 96.7 98.5  PLT 233 166   Cardiac Enzymes: No results for input(s): CKTOTAL, CKMB, CKMBINDEX, TROPONINI in the last 168 hours. BNP: Invalid input(s): POCBNP CBG:  Recent Labs Lab 02/05/17 2047 02/06/17 0552 02/08/17 0627 02/09/17 0608  GLUCAP 190* 167* 142* 147*   D-Dimer No results for input(s): DDIMER in the last 72 hours. Hgb A1c No results for input(s): HGBA1C in the last 72 hours. Lipid Profile No results for input(s): CHOL, HDL, LDLCALC, TRIG, CHOLHDL, LDLDIRECT in the last 72 hours. Thyroid function studies No results for input(s): TSH, T4TOTAL, T3FREE, THYROIDAB in the last 72 hours.  Invalid input(s): FREET3 Anemia  work up No results for input(s): VITAMINB12, FOLATE, FERRITIN, TIBC, IRON, RETICCTPCT in  the last 72 hours. Urinalysis    Component Value Date/Time   COLORURINE YELLOW 07/12/2016 0450   APPEARANCEUR CLEAR 07/12/2016 0450   LABSPEC 1.016 07/12/2016 0450   PHURINE 6.0 07/12/2016 0450   GLUCOSEU NEGATIVE 07/12/2016 0450   HGBUR TRACE (A) 07/12/2016 0450   BILIRUBINUR NEGATIVE 07/12/2016 0450   KETONESUR 40 (A) 07/12/2016 0450   PROTEINUR NEGATIVE 07/12/2016 0450   UROBILINOGEN 0.2 10/27/2015 1805   NITRITE NEGATIVE 07/12/2016 0450   LEUKOCYTESUR NEGATIVE 07/12/2016 0450   Sepsis Labs Invalid input(s): PROCALCITONIN,  WBC,  LACTICIDVEN Microbiology No results found for this or any previous visit (from the past 240 hour(s)).  Radiological Exams on Admission: No results found.    Thank you for allowing [place] to participate in the care of your patient.  We will continue to follow.  Time spent: 60 min  Nichoals Heyde [facility] Pager [phone]  If 7PM-7AM, please contact night-coverage [email or web address] Password [facility] 02/09/2017, 9:48 AM

## 2017-02-09 NOTE — [facility] Suicide Risk Assessment (Signed)
[facility]   Principal Problem: MDD (major depressive disorder), recurrent severe, without psychosis (HCC) Discharge Diagnoses:  Patient Active Problem List   Diagnosis Date Noted  . Type 2 diabetes mellitus with hyperglycemia, without long-term current use of insulin (HCC) [E11.65] 02/09/2017  . MDD (major depressive disorder), recurrent severe, without psychosis (HCC) [F33.2] 02/04/2017  . Complete heart block (HCC) [I44.2]   . Palpitation [R00.2] 10/11/2016  . Palpitations [R00.2] 10/11/2016  . Acute pancreatitis [K85.90] 09/15/2016  . Alcohol abuse [F10.10] 09/15/2016  . ADHD (attention deficit hyperactivity disorder) [F90.9] 09/15/2016  . Tobacco use [Z72.0] 09/15/2016  . Anxiety [F41.9] 09/15/2016  . Hypokalemia [E87.6] 09/15/2016  . Pancreatitis [K85.90] 07/09/2016  . Alcoholic pancreatitis [K85.20] 07/08/2016  . Alcohol withdrawal (HCC) [F10.239] 07/08/2016  . Bipolar disorder (HCC) [F31.9] 07/08/2016  . Essential hypertension [I10] 07/08/2016  . Left shoulder pain [M25.512] 09/09/2014    Total Time spent with patient: 30 minutes  Musculoskeletal: Strength & Muscle Tone: within normal limits Gait & Station: normal Patient leans: N/A  Psychiatric Specialty Exam: ROS no visual disturbances, no  chest pain, no shortness of breath, no nausea, no vomiting , no syncopal symptoms, no dizziness, no lightheadedness noted   Blood pressure (!) 142/102, pulse 95, temperature 98.8 F (37.1 C), temperature source Oral, resp. rate 18, height 5\' 4"  (1.626 m), weight 88 kg (194 lb), SpO2 96 %.Body mass index is 33.3 kg/m.  General Appearance: improved grooming   Eye Contact::  Good  Speech:  Normal Rate409  Volume:  Normal  Mood:  improved, presents euthymic, denies depression at this time  Affect:  Appropriate and reactive   Thought Process:  Linear  Orientation:  Full (Time, Place, and Person)  Thought Content:  no hallucinations, no delusions, not  internally preoccupied   Suicidal Thoughts:  No denies any suicidal or self injurious ideations, denies any violent or homicidal ideations  Homicidal Thoughts:  No  Memory:  recent and remote grossly intact   Judgement:  Other:  improving   Insight:  improving   Psychomotor Activity:  Normal  Concentration:  Good  Recall:  Good  Fund of Knowledge:Good  Language: Good  Akathisia:  Negative  Handed:  Right  AIMS (if indicated):     Assets:  Communication Skills Desire for Improvement Resilience  Sleep:  Number of Hours: 6.5  Cognition: WNL  ADL's:  Intact   Mental Status Per Nursing Assessment::   On Admission:     Demographic Factors:  56 year old female   Loss Factors: Currently not working, chronic medical illnesses , elderly parents are medically ill   Historical Factors: History of mood disorder/ depression, history of alcohol abuse   Risk Reduction Factors:   Responsible for children under 54 years of age, Sense of responsibility to family, Living with another person, especially a relative and Positive coping skills or problem solving skills  Continued Clinical Symptoms:  At this time patient is improved compared to admission , she presents alert, attentive, well related, with an improved mood and full range of affect, no thought disorder, no suicidal or self injurious ideations, no homicidal ideations, no psychotic symptoms , future oriented. Of note, patient has a history of cardiac illness and of HTN- she has remained hypertensive in spite of current antihypertensive medications . She was also recently diagnosed with DM. Marland Kitchen.Appreciate Hospitalist consultation, antihypertensive regimen was adjusted . Patient reports she is tolerating medications well, denies side effects. We have reviewed medication management, side effect  profiles- of note, patient encouraged not to restart Adderall unless specifically sanctioned by her cardiologist- she expresses understanding . No  disruptive or agitated behaviors on unit   Cognitive Features That Contribute To Risk:  No gross cognitive deficits noted upon discharge. Is alert , attentive, and oriented x 3    Suicide Risk:  Mild:  Suicidal ideation of limited frequency, intensity, duration, and specificity.  There are no identifiable plans, no associated intent, mild dysphoria and related symptoms, good self-control (both objective and subjective assessment), few other risk factors, and identifiable protective factors, including available and accessible social support.  Follow-up Information    VELAZQUEZ,[name], MD Follow up.   Specialty:  Internal Medicine Why:  Please Call & see your outpatient primary care provider as soon as possible for Diabetes & High blood pressure conditions. Contact information: [address] [place] [address] [phone]        [facility]. Call.   Specialty:  Behavioral Health Why:  Social worker has left two messages and is unable to establish an appointment at this time. Please call and make an appointment within 1-3 days of discharge to see Dr. Phillip Heal for medication management and Eldridge Dace, LCSW, for therapy. Contact information: [address] [place] [address] [phone]           Plan Of Care/Follow-up recommendations:  Activity:  as tolerated  Diet:  heart healthy , diabetic diet  Tests:  NA Other:  See below  Patient is requesting discharge and there are no current grounds for involuntary commitment  She is leaving unit in good spirits Plans to return home Plans to continue following up as above . Has established PCP ( Dr. Fanny Dance), and Cardiologist( Dr. Graciela Husbands) whom she plans to follow up with . Agrees to return to ED if any acute symptoms or worsening of BP .   Nehemiah Massed, MD 02/09/2017, 11:14 AM

## 2017-02-09 NOTE — Progress Notes (Signed)
  [facility] :  Will you be returning to the same living situation after discharge:  Yes,  Pt is returning home to live with family. At discharge, do you have transportation home?: Yes,  family will transport. Do you have the ability to pay for your medications: Yes,  prescriptions provided.  Release of information consent forms completed and in the chart;  Patient's signature needed at discharge.  Patient to Follow up at: Follow-up Information    VELAZQUEZ,[name], MD Follow up.   Specialty:  Internal Medicine Why:  Please Call & see your outpatient primary care provider as soon as possible for Diabetes & High blood pressure conditions. Contact information: [address] [place] [address] [phone]        [facility]. Call.   Specialty:  Behavioral Health Why:  Social worker has left two messages and is unable to establish an appointment at this time. Please call and make an appointment within 1-3 days of discharge to see Dr. Phillip HealJane Steiner for medication management and Eldridge DaceJoan Fraifeld, LCSW, for therapy. Contact information: [address] [place] [address] [phone]           Next level of care provider has access to [facility]:no  Safety Planning and Suicide Prevention discussed: Yes,  with pt's sister.  Have you used any form of tobacco in the last 30 days? (Cigarettes, Smokeless Tobacco, Cigars, and/or Pipes): Yes  Has patient been referred to the Quitline?: Patient refused referral  Patient has been referred for addiction treatment: Yes  Jonathon JordanLynn B Amar Sippel 02/09/2017, 11:12 AM

## 2017-02-09 NOTE — Plan of Care (Signed)
Problem: Food- and Nutrition-Related Knowledge Deficit (NB-1.1) Goal: Nutrition education Formal process to instruct or train a patient/client in a skill or to impart knowledge to help patients/clients voluntarily manage or modify food choices and eating behavior to maintain or improve health. Outcome: Completed/Met Date Met: 02/09/17  RD consulted for nutrition education regarding diabetes.   Lab Results  Component Value Date   HGBA1C 6.6 (H) 02/06/2017    RD provided "Carbohydrate Counting for People with Diabetes" handout from the Academy of Nutrition and Dietetics and MyPlate handout. Discussed different food groups and their effects on blood sugar, emphasizing carbohydrate-containing foods. Provided list of carbohydrates and recommended serving sizes of common foods.  Discussed importance of controlled and consistent carbohydrate intake throughout the day. Provided examples of ways to balance meals/snacks and encouraged intake of high-fiber, whole grain complex carbohydrates. Teach back method used.  Expect good compliance with follow-up.  Body mass index is 33.3 kg/m. Pt meets criteria for obesity based on current BMI.   Labs and medications reviewed. No further nutrition interventions warranted at this time.  If additional nutrition issues arise, please re-consult RD.  Clayton Bibles, MS, RD, LDN Pager: [phone] After Hours Pager: [phone]

## 2017-02-09 NOTE — Progress Notes (Signed)
Recreation Therapy Notes  Date: 02/09/17 Time: 0930 Location: [address] Dayroom  Group Topic: Stress Management  Goal Area(s) Addresses:  Patient will verbalize importance of using healthy stress management.  Patient will identify positive emotions associated with healthy stress management.   Intervention: Stress Management  Activity :  Resilience Meditation.  LRT introduced the stress management technique of meditation.  LRT play the meditation from the Calm app to allow patients to engage in the activity.  Patients were to follow along as the meditation was played to fully engage in the technique.  Education:  Stress Management, Discharge Planning.   Education Outcome: Acknowledges edcuation/In group clarification offered/Needs additional education  Clinical Observations/Feedback: Pt did not attend group.   Caroll RancherMarjette Keiaira Donlan, LRT/CTRS         Caroll RancherLindsay, Korra Christine A 02/09/2017 12:29 PM

## 2017-02-09 NOTE — Tx Team (Signed)
Interdisciplinary Treatment and Diagnostic Plan Update  02/09/2017 Time of Session: 9:30am Brittney MusterMaria C Cooper MRN: [id number]  Principal Diagnosis: MDD (major depressive disorder), recurrent severe, without psychosis (HCC)  Secondary Diagnoses: Principal Problem:   MDD (major depressive disorder), recurrent severe, without psychosis (HCC) Active Problems:   Essential hypertension   Tobacco use   Hypokalemia   Complete heart block (HCC)   Type 2 diabetes mellitus with hyperglycemia, without long-term current use of insulin (HCC)   Current Medications:  Current Facility-Administered Medications  Medication Dose Route Frequency Provider Last Rate Last Dose  . acetaminophen (TYLENOL) tablet 650 mg  650 mg Oral Q6H PRN Brittney AbbeLaurie Britton Parks, NP   650 mg at 02/09/17 0806  . alum & mag hydroxide-simeth (MAALOX/MYLANTA) 200-200-20 MG/5ML suspension 30 mL  30 mL Oral Q4H PRN Brittney AbbeLaurie Britton Parks, NP      . ARIPiprazole (ABILIFY) tablet 5 mg  5 mg Oral Daily Brittney BrookSheila Agustin, NP   5 mg at 02/09/17 0802  . aspirin EC tablet 81 mg  81 mg Oral Daily Brittney AbbeLaurie Britton Parks, NP   81 mg at 02/09/17 0802  . atorvastatin (LIPITOR) tablet 40 mg  40 mg Oral q1800 Brittney AbbeLaurie Britton Parks, NP   40 mg at 02/08/17 1713  . [START ON 02/10/2017] diltiazem (CARDIZEM CD) 24 hr capsule 180 mg  180 mg Oral Daily Brittney Huaavid Tat, MD      . folic acid (FOLVITE) tablet 1 mg  1 mg Oral Daily Brittney AbbeLaurie Britton Parks, NP   1 mg at 02/09/17 0802  . gabapentin (NEURONTIN) capsule 100 mg  100 mg Oral TID Brittney CottaFernando A Cobos, MD   100 mg at 02/09/17 0802  . guaiFENesin (ROBITUSSIN) 100 MG/5ML solution 200 mg  10 mL Oral Q4H PRN Brittney PolingJason A Berry, NP   200 mg at 02/09/17 0803  . hydrOXYzine (ATARAX/VISTARIL) tablet 25 mg  25 mg Oral Q6H PRN Brittney AbbeLaurie Britton Parks, NP   25 mg at 02/08/17 1036  . LORazepam (ATIVAN) tablet 0.5 mg  0.5 mg Oral Q6H PRN Brittney CottaFernando A Cobos, MD   0.5 mg at 02/09/17 0809  . losartan (COZAAR) tablet 100 mg  100 mg Oral Daily Brittney AbbeLaurie  Britton Parks, NP   100 mg at 02/09/17 0802  . magnesium hydroxide (MILK OF MAGNESIA) suspension 30 mL  30 mL Oral Daily PRN Brittney AbbeLaurie Britton Parks, NP      . metFORMIN (GLUCOPHAGE) tablet 500 mg  500 mg Oral BID Brittney Lenox PondsEdwin Silva Zapata, MD   500 mg at 02/09/17 0802  . multivitamin with minerals tablet 1 tablet  1 tablet Oral Daily Brittney AbbeLaurie Britton Parks, NP   1 tablet at 02/09/17 0802  . nicotine (NICODERM CQ - dosed in mg/24 hours) patch 21 mg  21 mg Transdermal Daily Brittney PolingJason A Berry, NP   21 mg at 02/09/17 0803  . sertraline (ZOLOFT) tablet 100 mg  100 mg Oral BID Brittney CottaFernando A Cobos, MD   100 mg at 02/09/17 [id number]  . thiamine (VITAMIN B-1) tablet 100 mg  100 mg Oral Daily Brittney AbbeLaurie Britton Parks, NP   100 mg at 02/09/17 [id number]  . traZODone (DESYREL) tablet 100 mg  100 mg Oral QHS PRN Brittney CottaFernando A Cobos, MD   100 mg at 02/08/17 2111    PTA Medications: Prescriptions Prior to Admission  Medication Sig Dispense Refill Last Dose  . ALPRAZolam (XANAX) 0.5 MG tablet Take 0.25 mg by mouth 5 (five) times daily as needed for anxiety.    02/02/2017 at Unknown time  .  amphetamine-dextroamphetamine (ADDERALL) 20 MG tablet Take 20 mg by mouth 3 (three) times daily.   02/02/2017 at Unknown time  . Brexpiprazole (REXULTI) 0.5 MG TABS Take 0.5 mg by mouth daily.   02/02/2017 at Unknown time  . Brexpiprazole (REXULTI) 1 MG TABS Take 1 mg by mouth daily.   Not Taking at Unknown time  . sertraline (ZOLOFT) 100 MG tablet Take 150 mg by mouth daily.    02/02/2017 at Unknown time  . traZODone (DESYREL) 50 MG tablet Take 100-150 mg by mouth at bedtime as needed for sleep. For sleep.   02/02/2017 at Unknown time  . [DISCONTINUED] acetaminophen (TYLENOL) 500 MG tablet Take 1,000 mg by mouth every 6 (six) hours as needed for mild pain.   02/02/2017 at Unknown time  . [DISCONTINUED] aspirin EC 81 MG tablet Take 81 mg by mouth daily.   02/02/2017 at Unknown time  . [DISCONTINUED] atorvastatin (LIPITOR) 40 MG tablet Take 40 mg by mouth daily at 6 PM.     02/02/2017 at Unknown time  . [DISCONTINUED] diltiazem (CARDIZEM CD) 120 MG 24 hr capsule Take 1 capsule (120 mg total) by mouth daily. 90 capsule 3 02/02/2017 at Unknown time  . [DISCONTINUED] folic acid (FOLVITE) 1 MG tablet Take 1 tablet (1 mg total) by mouth daily. 30 tablet 0 02/02/2017 at Unknown time  . [DISCONTINUED] losartan (COZAAR) 100 MG tablet Take 100 mg by mouth daily.    02/02/2017 at Unknown time  . [DISCONTINUED] Multiple Vitamin (MULTIVITAMIN WITH MINERALS) TABS tablet Take 1 tablet by mouth daily. 30 tablet 0 02/02/2017 at Unknown time  . [DISCONTINUED] thiamine 100 MG tablet Take 1 tablet (100 mg total) by mouth daily. 30 tablet 0 02/02/2017 at Unknown time    Treatment Modalities: Medication Management, Group therapy, Case management,  1 to 1 session with clinician, Psychoeducation, Recreational therapy.  Patient Stressors: Financial difficulties Health problems Marital or family conflict  Patient Strengths: Capable of independent living Contractor  Physician Treatment Plan for Primary Diagnosis: MDD (major depressive disorder), recurrent severe, without psychosis (HCC) Long Term Goal(s): Improvement in symptoms so as ready for discharge  Short Term Goals: Ability to identify changes in lifestyle to reduce recurrence of condition will improve Ability to verbalize feelings will improve Ability to disclose and discuss suicidal ideas Ability to demonstrate self-control will improve Ability to identify and develop effective coping behaviors will improve Ability to maintain clinical measurements within normal limits will improve Compliance with prescribed medications will improve Ability to identify triggers associated with substance abuse/mental health issues will improve Ability to identify changes in lifestyle to reduce recurrence of condition will improve Ability to verbalize feelings will improve Ability to disclose and discuss suicidal ideas Ability to demonstrate  self-control will improve Ability to identify and develop effective coping behaviors will improve Ability to maintain clinical measurements within normal limits will improve Compliance with prescribed medications will improve Ability to identify triggers associated with substance abuse/mental health issues will improve  Medication Management: Evaluate patient's response, side effects, and tolerance of medication regimen.  Therapeutic Interventions: 1 to 1 sessions, Unit Group sessions and Medication administration.  Evaluation of Outcomes: Adequate for Discharge  Physician Treatment Plan for Secondary Diagnosis: Principal Problem:   MDD (major depressive disorder), recurrent severe, without psychosis (HCC) Active Problems:   Essential hypertension   Tobacco use   Hypokalemia   Complete heart block (HCC)   Type 2 diabetes mellitus with hyperglycemia, without long-term current use of insulin (HCC)   Long  Term Goal(s): Improvement in symptoms so as ready for discharge  Short Term Goals: Ability to identify changes in lifestyle to reduce recurrence of condition will improve Ability to verbalize feelings will improve Ability to disclose and discuss suicidal ideas Ability to demonstrate self-control will improve Ability to identify and develop effective coping behaviors will improve Ability to maintain clinical measurements within normal limits will improve Compliance with prescribed medications will improve Ability to identify triggers associated with substance abuse/mental health issues will improve Ability to identify changes in lifestyle to reduce recurrence of condition will improve Ability to verbalize feelings will improve Ability to disclose and discuss suicidal ideas Ability to demonstrate self-control will improve Ability to identify and develop effective coping behaviors will improve Ability to maintain clinical measurements within normal limits will improve Compliance with  prescribed medications will improve Ability to identify triggers associated with substance abuse/mental health issues will improve  Medication Management: Evaluate patient's response, side effects, and tolerance of medication regimen.  Therapeutic Interventions: 1 to 1 sessions, Unit Group sessions and Medication administration.  Evaluation of Outcomes: Adequate for Discharge   RN Treatment Plan for Primary Diagnosis: MDD (major depressive disorder), recurrent severe, without psychosis (HCC) Long Term Goal(s): Knowledge of disease and therapeutic regimen to maintain health will improve  Short Term Goals: Ability to verbalize feelings will improve, Ability to disclose and discuss suicidal ideas and Ability to identify and develop effective coping behaviors will improve  Medication Management: RN will administer medications as ordered by provider, will assess and evaluate patient's response and provide education to patient for prescribed medication. RN will report any adverse and/or side effects to prescribing provider.  Therapeutic Interventions: 1 on 1 counseling sessions, Psychoeducation, Medication administration, Evaluate responses to treatment, Monitor vital signs and CBGs as ordered, Perform/monitor CIWA, COWS, AIMS and Fall Risk screenings as ordered, Perform wound care treatments as ordered.  Evaluation of Outcomes: Adequate for Discharge   LCSW Treatment Plan for Primary Diagnosis: MDD (major depressive disorder), recurrent severe, without psychosis (HCC) Long Term Goal(s): Safe transition to appropriate next level of care at discharge, Engage patient in therapeutic group addressing interpersonal concerns.  Short Term Goals: Engage patient in aftercare planning with referrals and resources, Identify triggers associated with mental health/substance abuse issues and Increase skills for wellness and recovery  Therapeutic Interventions: Assess for all discharge needs, 1 to 1 time with  Social worker, Explore available resources and support systems, Assess for adequacy in community support network, Educate family and significant other(s) on suicide prevention, Complete Psychosocial Assessment, Interpersonal group therapy.  Evaluation of Outcomes: Adequate for Discharge   Progress in Treatment: Attending groups: Intermittently Participating in groups:Yes   Taking medication as prescribed: Yes, MD continues to assess for medication changes as needed Toleration medication: Yes, no side effects reported at this time Family/Significant other contact made: Yes, pt's sister has been contacted. Patient understands diagnosis: Limited insight Discussing patient identified problems/goals with staff: Yes Medical problems stabilized or resolved: Yes Denies suicidal/homicidal ideation: Yes Issues/concerns per patient self-inventory: None Other: N/A  New problem(s) identified: None identified at this time.   New Short Term/Long Term Goal(s): None identified at this time.   Discharge Plan or Barriers: Pt will return home and follow-up outpatient with [facility].   Reason for Continuation of Hospitalization: None identified at this time.  Estimated Length of Stay: 0 days  Attendees: Patient: 02/09/2017  11:08 AM  Physician: Dr. Jama Flavors 02/09/2017  11:08 AM  Nursing: Vesta Mixer., RN; Reed Breech,, RN 02/09/2017  11:08 AM  RN Care Manager: Onnie Boer, RN 02/09/2017  11:08 AM  Social Worker: Vernie Shanks, LCSW; Donnelly Stager, LCSWA 02/09/2017  11:08 AM  Recreational Therapist:  02/09/2017  11:08 AM  Other: Armandina Stammer, NP; Gray Bernhardt, NP 02/09/2017  11:08 AM  Other:  02/09/2017  11:08 AM  Other: 02/09/2017  11:08 AM    Scribe for Treatment Team: Jonathon Jordan, MSW, LCSWA 02/09/2017 11:08 AM

## 2017-02-23 ENCOUNTER — Telehealth: Payer: Self-pay | Admitting: *Deleted

## 2017-02-23 NOTE — Telephone Encounter (Signed)
Brittney Cooper,  This pt is cleared for anesthetic care at [facility].  Thanks,  [name] 

## 2017-02-23 NOTE — Telephone Encounter (Signed)
John,  Would you please review this pt's chart?  She has a very complicated hx.    Thanks, W[employer] ResourcesKristen

## 2017-03-04 ENCOUNTER — Encounter: Payer: 59 | Admitting: Gastroenterology

## 2017-03-09 ENCOUNTER — Encounter: Payer: 59 | Admitting: Gastroenterology

## 2017-03-14 ENCOUNTER — Other Ambulatory Visit: Payer: Self-pay | Admitting: Internal Medicine

## 2017-04-12 ENCOUNTER — Encounter: Payer: Self-pay | Admitting: Internal Medicine

## 2017-04-12 ENCOUNTER — Ambulatory Visit (INDEPENDENT_AMBULATORY_CARE_PROVIDER_SITE_OTHER): Payer: Medicaid Other | Admitting: Internal Medicine

## 2017-04-12 VITALS — BP 138/92 | HR 84 | Ht 64.0 in | Wt 197.5 lb

## 2017-04-12 DIAGNOSIS — Z950 Presence of cardiac pacemaker: Secondary | ICD-10-CM

## 2017-04-12 DIAGNOSIS — R55 Syncope and collapse: Secondary | ICD-10-CM | POA: Diagnosis not present

## 2017-04-12 DIAGNOSIS — I442 Atrioventricular block, complete: Secondary | ICD-10-CM | POA: Diagnosis not present

## 2017-04-12 LAB — CUP PACEART INCLINIC DEVICE CHECK
Battery Remaining Longevity: 109 mo
Brady Statistic RA Percent Paced: 40 %
Brady Statistic RV Percent Paced: 1 %
Date Time Interrogation Session: 20180417142636
Implantable Lead Implant Date: 20171017
Implantable Lead Location: 753859
Implantable Lead Model: 377
Implantable Lead Serial Number: 49620910
Implantable Pulse Generator Implant Date: 20171017
Lead Channel Pacing Threshold Amplitude: 0.7 V
Lead Channel Pacing Threshold Pulse Width: 0.4 ms
Lead Channel Sensing Intrinsic Amplitude: 1.5 mV
Lead Channel Setting Pacing Amplitude: 3 V
Lead Channel Setting Pacing Pulse Width: 0.4 ms
MDC IDC LEAD IMPLANT DT: 20171017
MDC IDC LEAD LOCATION: 753860
MDC IDC LEAD SERIAL: 49593512
MDC IDC MSMT LEADCHNL RA IMPEDANCE VALUE: 565 Ohm
MDC IDC MSMT LEADCHNL RV IMPEDANCE VALUE: 643 Ohm
MDC IDC MSMT LEADCHNL RV SENSING INTR AMPL: 11.8 mV
MDC IDC PG SERIAL: 68784192
MDC IDC SET LEADCHNL RV PACING AMPLITUDE: 3 V
Pulse Gen Model: 394969

## 2017-04-12 MED ORDER — DILTIAZEM HCL ER COATED BEADS 180 MG PO CP24: 180.0000 mg | ORAL_CAPSULE | Freq: Two times a day (BID) | ORAL | 11 refills | Status: DC

## 2017-04-12 MED ORDER — LOSARTAN POTASSIUM 50 MG PO TABS: 50.0000 mg | ORAL_TABLET | Freq: Every day | ORAL | 11 refills | Status: DC

## 2017-04-12 NOTE — Progress Notes (Signed)
Patient Care Team: Cheron Schaumann, MD as PCP - General (Internal Medicine)   HPI  Brittney Cooper is a 56 y.o. female Seen in followup for dysautonomic syncope.She had recurrent syncope with a monitor on.This was associated with a 6 second pause 2 . She had head trauma requiring laceration. She underwent pacing with a Biotronik CLS   Other contributing complex medical comorbidities outlined in the old notes. 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 These are coming by fatigue and shortness of breath.   Beta blockers were discontinued because of nightmares;  they're resolved. She is tolerating the calcium blockers with modest benefit    [facility]   Past Medical History:  Diagnosis Date  . ADHD (attention deficit hyperactivity disorder)   . 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 Anxiety   . Arthritis   . Bipolar disorder (HCC)   . Chronic pain    back  . Depression   . Fatty liver   . Fracture of lateral malleolus of right fibula   . Hepatomegaly   . Hypertension   . OCD (obsessive compulsive disorder)   . Pancreatic pseudocyst   . Panic attack   . 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 Syncope    neurocardiogenic    Past Surgical History:  Procedure Laterality Date  . ABDOMINAL HYSTERECTOMY    . ANTERIOR AND POSTERIOR REPAIR    . BUNIONECTOMY Bilateral   . EP IMPLANTABLE DEVICE N/A 10/12/2016   Procedure: Pacemaker Implant;  Surgeon: Will Jorja Loa, MD;  Location: [facility];  Service: Cardiovascular;  Laterality: N/A;  . finfger surgery    . FINGER SURGERY    . LTCS    . MYRINGOTOMY    . ORIF ANKLE FRACTURE Right 07/25/2015   Procedure: OPEN  REDUCTION INTERNAL FIXATION (ORIF) RIGHT ANKLE ;  Surgeon: Sheral Apley, MD;  Location: [facility];  Service: Orthopedics;  Laterality: Right;  . PACEMAKER PLACEMENT    . RHINOPLASTY    . TONSILLECTOMY      Current Outpatient Prescriptions  Medication Sig Dispense Refill  . acetaminophen (TYLENOL) 500 MG tablet Take 2 tablets (1,000 mg total) by mouth every 6 (six) hours as needed for mild pain. 30 tablet 0  . ARIPiprazole (ABILIFY) 5 MG tablet Take 1 tablet (5 mg total) by mouth daily. For mood control 30 tablet 0  . aspirin EC 81 MG tablet Take 1 tablet (81 mg total) by mouth daily. For heart health    . atorvastatin (LIPITOR) 40 MG tablet Take 1 tablet (40 mg total) by mouth daily at 6 PM. For high cholesterol    . diltiazem (CARDIZEM CD) 180 MG 24 hr capsule TAKE ONE CAPSULE BY MOUTH FOR HIGH BLOOD PRESSURE 30 capsule 8  . folic acid (FOLVITE) 1 MG tablet Take 1 tablet (1 mg total) by mouth daily. For folic acid replacement 1 tablet 0  . gabapentin (NEURONTIN) 100 MG capsule Take 1 capsule (100 mg total) by mouth 3 (three) times daily. For agitation 90 capsule 0  . hydrOXYzine (ATARAX/VISTARIL) 25 MG tablet Take 1 tablet (25 mg total) by mouth every 6 (six) hours as needed for  anxiety. 60 tablet 0  . losartan (COZAAR) 100 MG tablet Take 1 tablet (100 mg total) by mouth daily. For high blood pressure    . metFORMIN (GLUCOPHAGE) 500 MG tablet Take 1 tablet (500 mg total) by mouth 2 (two) times daily with a meal. For diabetes management 60 tablet 0  . Multiple Vitamin (MULTIVITAMIN WITH MINERALS) TABS tablet Take 1 tablet by mouth daily. For low Vitamin replacement 1 tablet 0  . sertraline (ZOLOFT) 100 MG tablet Take 1 tablet (100 mg total) by mouth 2 (two) times daily. For depression 60 tablet 0  . thiamine 100 MG tablet Take 1 tablet (100 mg total) by mouth daily. For low thiamine 1 tablet 0   No current facility-administered medications for this visit.     Allergies    Allergen Reactions  . Codeine Nausea And Vomiting  . Demerol [Meperidine] Other (See Comments)    Hallucinations   . Metoprolol Other (See Comments)    nightmares      Review of Systems negative except from HPI and PMH  Physical Exam BP (!) 138/92 (BP Location: Left Arm, Patient Position: Sitting, Cuff Size: Normal)   Pulse 84   Ht  (1.626 m)   Wt 197 lb 8 oz (89.6 kg)   BMI 33.90 kg/m  Well developed and well nourished in no acute distress HENT normal E scleral and icterus clear Neck Supple JVP flat; carotids brisk and full Clear to ausculation Device pocket well healed; without hematoma or erythema.  There is no tethering   *Regular rate and rhythm, no murmurs gallops or rub Soft with active bowel sounds No clubbing cyanosis  Edema Alert and oriented, grossly normal motor and sensory function Mood sad and tearful Skin Warm and Dry  ECG demonstrates sinus at 84 Intervals 03/04/35   Assessment and  Plan  Syncope neurally mediated/Dysautonomia  Complete Heart BLock  Pacemaker  -- Biotronik The patient's device was interrogated.  The information was reviewed. No changes were made in the programming.     Hypertension   She has fatigue sinus tachycardia and exertional tachycardia. This is likely related at least in part due to autonomic dysfunction. She's had no interval syncope. She finds benefit with a calcium channel blocker. We will increase it from 180 daily--twice a day and at the same time decrease losartan 100--50.  Encouraged her to follow-up with psychiatry. Wellbutrin may help with her paucity of energy.  I stressed the importance of aerobic exercise.  Blood pressure sufficiently high so no added sodium  More than 50% of 45 min was spent in counseling related to the above

## 2017-04-12 NOTE — Patient Instructions (Addendum)
Medication Instructions: - Your physician has recommended you make the following change in your medication:  1) Increase diltiazem 180 mg- take one capsule by mouth twice daily 2) Decrease losartan 50 mg- take one tablet by mouth once daily  Labwork: - none ordered  Procedures/Testing: - none ordered  Follow-Up: - Your physician wants you to follow-up in: 6 months with Dr. Graciela Husbands [facility]). You will receive a reminder letter in the mail two months in advance. If you don't receive a letter, please call our office to schedule the follow-up appointment.   Any Additional Special Instructions Will Be Listed Below (If Applicable).     If you need a refill on your cardiac medications before your next appointment, please call your pharmacy.

## 2017-06-03 ENCOUNTER — Other Ambulatory Visit: Payer: Self-pay | Admitting: Internal Medicine

## 2017-06-23 ENCOUNTER — Other Ambulatory Visit: Payer: Self-pay | Admitting: *Deleted

## 2017-06-23 ENCOUNTER — Encounter: Payer: Self-pay | Admitting: Gastroenterology

## 2017-06-23 MED ORDER — LOSARTAN POTASSIUM 50 MG PO TABS: 50.0000 mg | ORAL_TABLET | Freq: Every day | ORAL | 2 refills | Status: DC

## 2017-06-30 ENCOUNTER — Ambulatory Visit (AMBULATORY_SURGERY_CENTER): Payer: Self-pay | Admitting: *Deleted

## 2017-06-30 VITALS — Ht 64.0 in | Wt 184.6 lb

## 2017-06-30 DIAGNOSIS — Z1211 Encounter for screening for malignant neoplasm of colon: Secondary | ICD-10-CM

## 2017-06-30 MED ORDER — NA SULFATE-K SULFATE-MG SULF 17.5-3.13-1.6 GM/177ML PO SOLN: 1.0000 | Freq: Once | ORAL | 0 refills | Status: AC

## 2017-06-30 NOTE — Progress Notes (Signed)
No egg or soy allergy known to patient  No issues with past sedation with any surgeries  or procedures, no intubation problems - states wakes with fent./versed No diet pills per patient No home 02 use per patient  No blood thinners per patient  Pt denies issues with constipation  No A fib or A flutter  EMMI video sent to pt's e mail -- Pt requesting peds scope

## 2017-07-11 ENCOUNTER — Encounter: Payer: Self-pay | Admitting: Gastroenterology

## 2017-07-12 ENCOUNTER — Ambulatory Visit (INDEPENDENT_AMBULATORY_CARE_PROVIDER_SITE_OTHER): Payer: Medicaid Other | Admitting: *Deleted

## 2017-07-12 DIAGNOSIS — I442 Atrioventricular block, complete: Secondary | ICD-10-CM | POA: Diagnosis not present

## 2017-07-12 LAB — CUP PACEART REMOTE DEVICE CHECK
Brady Statistic AP VP Percent: 1 %
Brady Statistic AP VS Percent: 41 %
Brady Statistic AS VP Percent: 0 %
Brady Statistic RA Percent Paced: 42 %
Implantable Lead Implant Date: 20171017
Implantable Lead Location: 753859
Implantable Lead Model: 377
Implantable Lead Model: 377
Lead Channel Impedance Value: 545 Ohm
Lead Channel Impedance Value: 617 Ohm
Lead Channel Pacing Threshold Pulse Width: 0.4 ms
Lead Channel Setting Pacing Amplitude: 2 V
Lead Channel Setting Pacing Pulse Width: 0.4 ms
MDC IDC LEAD IMPLANT DT: 20171017
MDC IDC LEAD LOCATION: 753860
MDC IDC LEAD SERIAL: 49593512
MDC IDC LEAD SERIAL: 49620910
MDC IDC MSMT LEADCHNL RA PACING THRESHOLD AMPLITUDE: 0.8 V
MDC IDC MSMT LEADCHNL RA PACING THRESHOLD PULSEWIDTH: 0.4 ms
MDC IDC MSMT LEADCHNL RV PACING THRESHOLD AMPLITUDE: 1.1 V
MDC IDC PG IMPLANT DT: 20171017
MDC IDC PG SERIAL: 68784192
MDC IDC SESS DTM: 20180717060216
MDC IDC SET LEADCHNL RV PACING AMPLITUDE: 2.8 V
MDC IDC STAT BRADY AS VS PERCENT: 57 %
MDC IDC STAT BRADY RV PERCENT PACED: 1 %

## 2017-07-12 NOTE — Progress Notes (Signed)
Remote pacemaker transmission.   

## 2017-07-13 ENCOUNTER — Encounter: Payer: Self-pay | Admitting: Cardiology

## 2017-07-14 ENCOUNTER — Telehealth: Payer: Self-pay | Admitting: Gastroenterology

## 2017-07-14 NOTE — Telephone Encounter (Signed)
ok 

## 2017-07-15 ENCOUNTER — Encounter: Payer: Medicaid Other | Admitting: Gastroenterology

## 2017-07-24 ENCOUNTER — Encounter (HOSPITAL_COMMUNITY): Payer: Self-pay | Admitting: Emergency Medicine

## 2017-07-24 ENCOUNTER — Emergency Department (HOSPITAL_COMMUNITY)
Admission: EM | Admit: 2017-07-24 | Discharge: 2017-07-25 | Disposition: A | Discharge status: Home / Self Care | Payer: Medicaid Other | Attending: Emergency Medicine | Admitting: Emergency Medicine

## 2017-07-24 ENCOUNTER — Telehealth: Payer: Self-pay | Admitting: Internal Medicine

## 2017-07-24 DIAGNOSIS — R42 Dizziness and giddiness: Secondary | ICD-10-CM

## 2017-07-24 DIAGNOSIS — F17210 Nicotine dependence, cigarettes, uncomplicated: Secondary | ICD-10-CM | POA: Diagnosis not present

## 2017-07-24 DIAGNOSIS — Z950 Presence of cardiac pacemaker: Secondary | ICD-10-CM | POA: Insufficient documentation

## 2017-07-24 DIAGNOSIS — I10 Essential (primary) hypertension: Secondary | ICD-10-CM | POA: Diagnosis not present

## 2017-07-24 DIAGNOSIS — E119 Type 2 diabetes mellitus without complications: Secondary | ICD-10-CM | POA: Insufficient documentation

## 2017-07-24 LAB — CBC
HEMATOCRIT: 44.9 % (ref 36.0–46.0)
Hemoglobin: 15.4 g/dL — ABNORMAL HIGH (ref 12.0–15.0)
MCH: 33.3 pg (ref 26.0–34.0)
MCHC: 34.3 g/dL (ref 30.0–36.0)
MCV: 97.2 fL (ref 78.0–100.0)
Platelets: 265 10*3/uL (ref 150–400)
RBC: 4.62 MIL/uL (ref 3.87–5.11)
RDW: 15.1 % (ref 11.5–15.5)
WBC: 8.8 10*3/uL (ref 4.0–10.5)

## 2017-07-24 LAB — BASIC METABOLIC PANEL
Anion gap: 19 — ABNORMAL HIGH (ref 5–15)
BUN: 9 mg/dL (ref 6–20)
CHLORIDE: 95 mmol/L — AB (ref 101–111)
CO2: 21 mmol/L — AB (ref 22–32)
Calcium: 10.5 mg/dL — ABNORMAL HIGH (ref 8.9–10.3)
Creatinine, Ser: 0.85 mg/dL (ref 0.44–1.00)
GFR calc Af Amer: >60 mL/min (ref >60)
GLUCOSE: 170 mg/dL — AB (ref 65–99)
POTASSIUM: 3.1 mmol/L — AB (ref 3.5–5.1)
Sodium: 135 mmol/L (ref 135–145)

## 2017-07-24 LAB — I-STAT TROPONIN, ED: Troponin i, poc: 0 ng/mL (ref 0.00–0.08)

## 2017-07-24 MED ORDER — SODIUM CHLORIDE 0.9 % IV BOLUS (SEPSIS)
500.0000 mL | Freq: Once | INTRAVENOUS | Status: AC
  Administered 2017-07-24: 500 mL via INTRAVENOUS

## 2017-07-24 NOTE — ED Triage Notes (Signed)
Patient arrives from home with complaint of dizziness with position changes. States history of the same, but currently symptoms are worse. History of pacemaker and AICD. Explains that when she stands from sitting or lying she feels dizzy and her heart begins to pound. Endorses orthostatic BPs at home. States BP laying was 130/80s and standing 90s/60s.

## 2017-07-24 NOTE — ED Notes (Signed)
Pt sts she takes B/P meds but blood pressure is never this low.  St's when she stands she becomes dizzy.  Pt denies any nausea, vomiting or diarrhea,  Pt alert and oriented x's 3, skin warm and dry, color appropriate

## 2017-07-24 NOTE — ED Provider Notes (Signed)
Emergency Department Provider Note   I have reviewed the triage vital signs and the nursing notes.   HISTORY  Chief Complaint Dizziness   HPI Brittney Cooper is a 56 y.o. 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 Clide CliffKline presents to the ED with positional lightheadedness and baseline low BP at home. Patient reports typical BP that is significantly elevated. She commonly feels lightheaded with a "normal BP" but over the last several days has had more severe lightheadedness. Symptoms are worse when she is sitting up or standing suddenly. Denies syncope. Associated chest pain but does occasionally have palpitations. No fevers or chills. No medication changes since February of this year. No changes to her pacemaker settings. No falls or head trauma. No vertigo symptoms.    Past Medical History:  Diagnosis Date  . ADHD (attention deficit hyperactivity disorder)   . 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 Allergy   . Anxiety   . Arthritis   . Bipolar disorder (HCC)   . Cardiac arrest (HCC) 06-2015, 08-2016   x3- pt has pacemaker   . Chronic pain    back  . Depression   . Fatty liver   . Fracture of lateral malleolus of right fibula   . Hepatomegaly   . Hyperlipidemia   . Hypertension   . OCD (obsessive compulsive disorder)   . Pancreatic pseudocyst   . Panic attack   . 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 Syncope    neurocardiogenic  . Third degree heart block [facility](HCC)     Patient Active Problem List   Diagnosis Date Noted  . Type 2 diabetes mellitus with hyperglycemia, without Quinlan Vollmer-term current use of insulin (HCC) 02/09/2017  . MDD (major depressive disorder), recurrent severe, 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  . Complete heart block (HCC)   . Palpitation 10/11/2016  . Palpitations 10/11/2016  . Acute pancreatitis  09/15/2016  . Alcohol abuse 09/15/2016  . ADHD (attention deficit hyperactivity disorder) 09/15/2016  . Tobacco use 09/15/2016  . Anxiety 09/15/2016  . Hypokalemia 09/15/2016  . Pancreatitis 07/09/2016  . Alcoholic pancreatitis 07/08/2016  . Alcohol withdrawal (HCC) 07/08/2016  . Bipolar disorder (HCC) 07/08/2016  . Essential hypertension 07/08/2016  . Left shoulder pain 09/09/2014    Past Surgical History:  Procedure Laterality Date  . ABDOMINAL HYSTERECTOMY    . ANTERIOR AND POSTERIOR REPAIR    . BUNIONECTOMY Bilateral   . CESAREAN SECTION    . EP IMPLANTABLE DEVICE N/A 10/12/2016   Procedure: Pacemaker Implant;  Surgeon: Will Jorja LoaMartin Camnitz, MD;  Location: [facility] IN[facility]ASIVE [facility];  Service: Cardiovascular;  Laterality: N/A;  . finfger surgery    . FINGER SURGERY    . LTCS    . MYRINGOTOMY    . ORIF ANKLE FRACTURE Right 07/25/2015   Procedure: OPEN REDUCTION INTERNAL FIXATION (ORIF) RIGHT ANKLE ;  Surgeon: Sheral Apleyimothy D Murphy, MD;  Location:  SURGERY CENTER;  Service: Orthopedics;  Laterality: Right;  . PACEMAKER PLACEMENT    . RHINOPLASTY    . TONSILLECTOMY    . TUBAL LIGATION      Current Outpatient Rx  . Order #: [id number] Class: Historical Med  . Order #: [id number] Class: Historical Med  . Order #: [id number] Class: Normal  . Order #: [id number] Class: No Print  . Order #: [id number] Class: No Print  . Order #:  [id number] Class: Normal  . Order #: [id number] Class: No Print  . Order #: [id number] Class: Normal  . Order #: [id number] Class: Historical Med  . Order #: [id number] Class: Historical Med  . Order #: [id number] Class: Normal  . Order #: [id number] Class: Normal  . Order #: [id number] Class: No Print  . Order #: [id number] Class: Normal  . Order #: [id number] Class: No Print  . Order #: [id number] Class: Historical Med  . Order #: [id number] Class: Historical Med  . Order #: [id number] Class: No Print    Allergies Codeine; Demerol [meperidine]; and Metoprolol  Family History    Problem Relation Age of Onset  . Stroke Mother   . Kidney disease Mother   . Colon polyps Mother   . Lung cancer Father        stage IV   . Colon polyps Father   . Stomach cancer Maternal Grandfather   . Colon cancer Paternal Grandfather   . Rectal cancer Neg Hx     Social History Social History  Substance Use Topics  . Smoking status: Current Every Day Smoker    Packs/day: 1.00    Years: 15.00    Types: Cigarettes  . Smokeless tobacco: Never Used     Comment: 5 cigarettes a day  . Alcohol use No     Comment: vodka daily     Review of Systems  Constitutional: No fever/chills Eyes: No visual changes. ENT: No sore throat. Cardiovascular: Denies chest pain. Positive palpitations and lightheadedness.  Respiratory: Denies shortness of breath. Gastrointestinal: No abdominal pain.  No nausea, no vomiting.  No diarrhea.  No constipation. Genitourinary: Negative for dysuria. Musculoskeletal: Negative for back pain. Skin: Negative for rash. Neurological: Negative for headaches, focal weakness or numbness.  10-point ROS otherwise negative.  ____________________________________________   PHYSICAL EXAM:  VITAL SIGNS: ED Triage Vitals  Enc Vitals Group     BP 07/24/17 1944 109/69     Pulse Rate 07/24/17 1944 97     Resp 07/24/17 1944 16     Temp 07/24/17 1944 98.5 F (36.9 C)     Temp Source 07/24/17 1944 Oral     SpO2 07/24/17 1934 96 %     Weight 07/24/17 1945 185 lb (83.9 kg)     Height 07/24/17 1945 5\' 4"  (1.626 m)     Pain Score 07/24/17 1947 0   Constitutional: Alert and oriented. Well appearing and in no acute distress. Eyes: Conjunctivae are normal.  Head: Atraumatic. Nose: No congestion/rhinnorhea. Mouth/Throat: Mucous membranes are moist.  Oropharynx non-erythematous. Neck: No stridor. Cardiovascular: Normal rate, regular rhythm with intermittent pacing spikes on monitor. Good peripheral circulation. Grossly normal heart sounds.   Respiratory: Normal  respiratory effort.  No retractions. Lungs CTAB. Gastrointestinal: Soft and nontender. No distention.  Musculoskeletal: No lower extremity tenderness nor edema. No gross deformities of extremities. Neurologic:  Normal speech and language. No gross focal neurologic deficits are appreciated.  Skin:  Skin is warm, dry and intact. No rash noted. Psychiatric: Mood and affect are normal. Speech and behavior are normal.  ____________________________________________   LABS (all labs ordered are listed, but only abnormal results are displayed)  Labs Reviewed  BASIC METABOLIC PANEL - Abnormal; Notable for the following:       Result Value   Potassium 3.1 (*)    Chloride 95 (*)    CO2 21 (*)    Glucose, Bld 170 (*)    Calcium 10.5 (*)    Anion gap 19 (*)    All other components within normal limits  CBC - Abnormal; Notable for the following:    Hemoglobin 15.4 (*)    All other components within normal limits  I-STAT TROPONIN, ED   ____________________________________________  EKG   EKG Interpretation  Date/Time:  Sunday July 24 2017 19:46:48 EDT Ventricular Rate:  98 PR Interval:  124 QRS Duration: 86 QT Interval:  438 QTC Calculation: 559 R Axis:   -2 Text Interpretation:  Normal sinus rhythm Nonspecific ST and T wave abnormality Abnormal ECG No STEMI.  Similar to prior tracing.  Confirmed by Alona Bene [phone]) on 07/24/2017 9:56:55 PM       ____________________________________________  RADIOLOGY  None ____________________________________________   PROCEDURES  Procedure(s) performed:   Procedures  None ____________________________________________   INITIAL IMPRESSION / ASSESSMENT AND PLAN / ED COURSE  Pertinent labs & imaging results that were available during my care of the patient were reviewed by me and considered in my medical decision making (see chart for details).  Patient resents to the emergency department for evaluation of lightheadedness made  worse with standing or sitting up suddenly. She also has her baseline blood pressure is running lower than normal. No signs or symptoms to suggest underlying infection symptoms or developing sepsis. Patient has history of third-degree heart block a pacemaker in place. I appreciate intermittent pacing on bedside monitor.   Spoke with Cardiology fellow who spoke with patient by phone earlier this evening. Reviewed labs and vital signs. Patient has had extensive outpatient w/u for similar. Recommends increasing salt and fluid intake along with PCP follow up. Patient in agreement with plan and pleased at discharge.   At this time, I do not feel there is any life-threatening condition present. I have reviewed and discussed all results (EKG, imaging, lab, urine as appropriate), exam findings with patient. I have reviewed nursing notes and appropriate previous records.  I feel the patient is safe to be discharged home without further emergent workup. Discussed usual and customary return precautions. Patient and family (if present) verbalize understanding and are comfortable with this plan.  Patient will follow-up with their primary care provider. If they do not have a primary care provider, information for follow-up has been provided to them. All questions have been answered.  ____________________________________________  FINAL CLINICAL IMPRESSION(S) / ED DIAGNOSES  Final diagnoses:  Lightheadedness     MEDICATIONS GIVEN DURING THIS VISIT:  Medications  sodium chloride 0.9 % bolus 500 mL (500 mLs Intravenous New Bag/Given 07/24/17 2227)     NEW OUTPATIENT MEDICATIONS STARTED DURING THIS VISIT:  None   Note:  This document was prepared using Dragon voice recognition software and may include unintentional dictation errors.  Alona Bene, MD Emergency Medicine    Eryn Marandola, Arlyss Repress, MD 07/24/17 [phone]

## 2017-07-24 NOTE — Telephone Encounter (Signed)
Returned page from patient. Having worsening orthostatic symptoms in the last couple of days. Has had several falls. Has checked orthostatic BP's, which have been positive at home (SBP 120-->80 w/ standing). Has not lost consciousness. No palpitations.  Has been wearing abdominal binder regularly. Recommended increasing her salt intake, staying away from unsafe places and activities. Patient states that she does not feel safe by herself at home with these symptoms. Worried that she is going to fall and hurt herself and not be able to get to her phone to call for help. I recommended that she come to the ED if she feels unsafe at home. Advised that she call EMS and that she not drive herself in. Message will be cc'd to electrophysiologist.   Fatima BlankAnthony Carnicelli, MD Cardiology fellow

## 2017-07-24 NOTE — Discharge Instructions (Signed)
You were seen in the ED today with lightheadedness. Call your PCP tomorrow for further outpatient evaluation and testing as needed. Increase your salt intake and continue drinking fluids.   Return to the ED with any fever, chills, chest pain, or shortness of breath.

## 2017-07-24 NOTE — ED Notes (Signed)
Lying B/P 99/71  P.85 Sitting B/P 88/71  P.102 Standing B/P  99/77  P.102

## 2017-07-27 ENCOUNTER — Encounter: Payer: Self-pay | Admitting: Cardiology

## 2017-08-24 ENCOUNTER — Telehealth: Payer: Self-pay | Admitting: Gastroenterology

## 2017-08-24 ENCOUNTER — Encounter: Payer: Self-pay | Admitting: Gastroenterology

## 2017-08-24 ENCOUNTER — Ambulatory Visit (AMBULATORY_SURGERY_CENTER): Payer: Medicaid Other | Admitting: Gastroenterology

## 2017-08-24 VITALS — BP 133/85 | HR 73 | Temp 98.7°F | Resp 18

## 2017-08-24 DIAGNOSIS — Z1211 Encounter for screening for malignant neoplasm of colon: Secondary | ICD-10-CM | POA: Diagnosis not present

## 2017-08-24 MED ORDER — SODIUM CHLORIDE 0.9 % IV SOLN: 500.0000 mL | INTRAVENOUS | Status: DC

## 2017-08-24 NOTE — Op Note (Signed)
[facility] Patient Name: Brittney Cooper Procedure Date: 08/24/2017 1:43 PM MRN: [id number] Endoscopist: Napoleon Form , MD Age: 56 Referring MD:  Date of Birth: 22-Oct-1961 Gender: Female Account #: [id number] Procedure:                Colonoscopy Indications:              Screening for colorectal malignant neoplasm, This                            is the patient's first colonoscopy Medicines:                Monitored Anesthesia Care Procedure:                Pre-Anesthesia Assessment:                           - Prior to the procedure, a History and Physical                            was performed, and patient medications and                            allergies were reviewed. The patient's tolerance of                            previous anesthesia was also reviewed. The risks                            and benefits of the procedure and the sedation                            options and risks were discussed with the patient.                            All questions were answered, and informed consent                            was obtained. Prior Anticoagulants: The patient has                            taken no previous anticoagulant or antiplatelet                            agents. ASA Grade Assessment: II - A patient with                            mild systemic disease. After reviewing the risks                            and benefits, the patient was deemed in                            satisfactory condition to undergo the procedure.  After obtaining informed consent, the colonoscope                            was passed under direct vision. Throughout the                            procedure, the patient's blood pressure, pulse, and                            oxygen saturations were monitored continuously. The                            Colonoscope was introduced through the anus and                            advanced to the the  terminal ileum, with                            identification of the appendiceal orifice and IC                            valve. The colonoscopy was performed without                            difficulty. The patient tolerated the procedure                            well. The quality of the bowel preparation was                            excellent. The ileocecal valve, appendiceal                            orifice, and rectum were photographed. Scope In: 1:53:16 PM Scope Out: 2:06:02 PM Scope Withdrawal Time: 0 hours 8 minutes 51 seconds  Total Procedure Duration: 0 hours 12 minutes 46 seconds  Findings:                 The perianal and digital rectal examinations were                            normal.                           A few small-mouthed diverticula were found in the                            sigmoid colon, descending colon, transverse colon                            and ascending colon.                           Non-bleeding internal hemorrhoids were found during  retroflexion. The hemorrhoids were small. Complications:            No immediate complications. Estimated Blood Loss:     Estimated blood loss: none. Impression:               - Diverticulosis in the sigmoid colon, in the                            descending colon, in the transverse colon and in                            the ascending colon.                           - Non-bleeding internal hemorrhoids.                           - No specimens collected. Recommendation:           - Patient has a contact number available for                            emergencies. The signs and symptoms of potential                            delayed complications were discussed with the                            patient. Return to normal activities tomorrow.                            Written discharge instructions were provided to the                            patient.                            - Resume previous diet.                           - Continue present medications.                           - Repeat colonoscopy in 10 years for screening                            purposes. Napoleon FormKavitha V. Tationna Fullard, MD 08/24/2017 2:11:07 PM This report has been signed electronically.

## 2017-08-24 NOTE — Progress Notes (Signed)
To recovery, report to RN, VSS. 

## 2017-08-24 NOTE — Telephone Encounter (Signed)
Returned patient's call and she states that she did not start second prep until 10 am.  We advised her to go ahead and come in at scheduled time. Also told her not to have anything by mouth 3 hours prior.  All questions answered.

## 2017-08-24 NOTE — Patient Instructions (Signed)
YOU HAD AN ENDOSCOPIC PROCEDURE TODAY AT THE  ENDOSCOPY CENTER:   Refer to the procedure report that was given to you for any specific questions about what was found during the examination.  If the procedure report does not answer your questions, please call your gastroenterologist to clarify.  If you requested that your care partner not be given the details of your procedure findings, then the procedure report has been included in a sealed envelope for you to review at your convenience later.  YOU SHOULD EXPECT: Some feelings of bloating in the abdomen. Passage of more gas than usual.  Walking can help get rid of the air that was put into your GI tract during the procedure and reduce the bloating. If you had a lower endoscopy (such as a colonoscopy or flexible sigmoidoscopy) you may notice spotting of blood in your stool or on the toilet paper. If you underwent a bowel prep for your procedure, you may not have a normal bowel movement for a few days.  Please Note:  You might notice some irritation and congestion in your nose or some drainage.  This is from the oxygen used during your procedure.  There is no need for concern and it should clear up in a day or so.  SYMPTOMS TO REPORT IMMEDIATELY:   Following lower endoscopy (colonoscopy or flexible sigmoidoscopy):  Excessive amounts of blood in the stool  Significant tenderness or worsening of abdominal pains  Swelling of the abdomen that is new, acute  Fever of 100F or higher  For urgent or emergent issues, a gastroenterologist can be reached at any hour by calling [phone].   DIET:  We do recommend a small meal at first, but then you may proceed to your regular diet.  Drink plenty of fluids but you should avoid alcoholic beverages for 24 hours.  ACTIVITY:  You should plan to take it easy for the rest of today and you should NOT DRIVE or use heavy machinery until tomorrow (because of the sedation medicines used during the test).     FOLLOW UP: Our staff will call the number listed on your records the next business day following your procedure to check on you and address any questions or concerns that you may have regarding the information given to you following your procedure. If we do not reach you, we will leave a message.  However, if you are feeling well and you are not experiencing any problems, there is no need to return our call.  We will assume that you have returned to your regular daily activities without incident.  If any biopsies were taken you will be contacted by phone or by letter within the next 1-3 weeks.  Please call us at [phone] if you have not heard about the biopsies in 3 weeks.    SIGNATURES/CONFIDENTIALITY: You and/or your care partner have signed paperwork which will be entered into your electronic medical record.  These signatures attest to the fact that that the information above on your After Visit Summary has been reviewed and is understood.  Full responsibility of the confidentiality of this discharge information lies with you and/or your care-partner.  Hemorrhoid and diverticulosis information given.

## 2017-08-24 NOTE — Progress Notes (Signed)
Pt's states no medical or surgical changes since previsit or office visit. maw 

## 2017-08-25 ENCOUNTER — Telehealth: Payer: Self-pay | Admitting: Gastroenterology

## 2017-08-25 ENCOUNTER — Telehealth: Payer: Self-pay

## 2017-08-25 NOTE — Telephone Encounter (Signed)
Called Patient back and gave her the names of the nurses that worked with her.

## 2017-08-25 NOTE — Telephone Encounter (Signed)
  Follow up Call-  Call back number 08/24/2017  Post procedure Call Back phone  # #[phone] cell  Permission to leave phone message Yes  Some recent data might be hidden     Patient questions:  Do you have a fever, pain , or abdominal swelling? No. Pain Score  0 *  Have you tolerated food without any problems? Yes.    Have you been able to return to your normal activities? Yes.    Do you have any questions about your discharge instructions: Diet   No. Medications  No. Follow up visit  No.  Do you have questions or concerns about your Care? No.  Actions: * If pain score is 4 or above: No action needed, pain <4.

## 2017-08-25 NOTE — Telephone Encounter (Signed)
Called [phone] and left a messaged we tried to reach pt for a follow up call. maw

## 2017-09-10 ENCOUNTER — Encounter (HOSPITAL_BASED_OUTPATIENT_CLINIC_OR_DEPARTMENT_OTHER): Payer: Self-pay | Admitting: Emergency Medicine

## 2017-09-10 ENCOUNTER — Emergency Department (HOSPITAL_BASED_OUTPATIENT_CLINIC_OR_DEPARTMENT_OTHER): Payer: Medicaid Other

## 2017-09-10 ENCOUNTER — Emergency Department (HOSPITAL_BASED_OUTPATIENT_CLINIC_OR_DEPARTMENT_OTHER)
Admission: EM | Admit: 2017-09-10 | Discharge: 2017-09-10 | Disposition: A | Discharge status: Home / Self Care | Payer: Medicaid Other | Attending: Emergency Medicine | Admitting: Emergency Medicine

## 2017-09-10 DIAGNOSIS — W1830XA Fall on same level, unspecified, initial encounter: Secondary | ICD-10-CM | POA: Insufficient documentation

## 2017-09-10 DIAGNOSIS — E1165 Type 2 diabetes mellitus with hyperglycemia: Secondary | ICD-10-CM | POA: Diagnosis not present

## 2017-09-10 DIAGNOSIS — Z7984 Long term (current) use of oral hypoglycemic drugs: Secondary | ICD-10-CM | POA: Insufficient documentation

## 2017-09-10 DIAGNOSIS — Y939 Activity, unspecified: Secondary | ICD-10-CM | POA: Insufficient documentation

## 2017-09-10 DIAGNOSIS — M25562 Pain in left knee: Secondary | ICD-10-CM | POA: Insufficient documentation

## 2017-09-10 DIAGNOSIS — W19XXXA Unspecified fall, initial encounter: Secondary | ICD-10-CM

## 2017-09-10 DIAGNOSIS — Z7982 Long term (current) use of aspirin: Secondary | ICD-10-CM | POA: Insufficient documentation

## 2017-09-10 DIAGNOSIS — Y92019 Unspecified place in single-family (private) house as the place of occurrence of the external cause: Secondary | ICD-10-CM | POA: Insufficient documentation

## 2017-09-10 DIAGNOSIS — R42 Dizziness and giddiness: Secondary | ICD-10-CM | POA: Diagnosis present

## 2017-09-10 DIAGNOSIS — Z950 Presence of cardiac pacemaker: Secondary | ICD-10-CM | POA: Diagnosis not present

## 2017-09-10 DIAGNOSIS — I442 Atrioventricular block, complete: Secondary | ICD-10-CM | POA: Insufficient documentation

## 2017-09-10 DIAGNOSIS — E876 Hypokalemia: Secondary | ICD-10-CM | POA: Diagnosis not present

## 2017-09-10 DIAGNOSIS — Y999 Unspecified external cause status: Secondary | ICD-10-CM | POA: Insufficient documentation

## 2017-09-10 DIAGNOSIS — F17210 Nicotine dependence, cigarettes, uncomplicated: Secondary | ICD-10-CM | POA: Diagnosis not present

## 2017-09-10 DIAGNOSIS — M25521 Pain in right elbow: Secondary | ICD-10-CM | POA: Insufficient documentation

## 2017-09-10 DIAGNOSIS — I10 Essential (primary) hypertension: Secondary | ICD-10-CM | POA: Insufficient documentation

## 2017-09-10 LAB — COMPREHENSIVE METABOLIC PANEL
ALK PHOS: 81 U/L (ref 38–126)
ALT: 31 U/L (ref 14–54)
ANION GAP: 12 (ref 5–15)
AST: 27 U/L (ref 15–41)
Albumin: 3.9 g/dL (ref 3.5–5.0)
BUN: 14 mg/dL (ref 6–20)
CALCIUM: 8.9 mg/dL (ref 8.9–10.3)
CO2: 24 mmol/L (ref 22–32)
CREATININE: 0.44 mg/dL (ref 0.44–1.00)
Chloride: 100 mmol/L — ABNORMAL LOW (ref 101–111)
Glucose, Bld: 119 mg/dL — ABNORMAL HIGH (ref 65–99)
Potassium: 3 mmol/L — ABNORMAL LOW (ref 3.5–5.1)
SODIUM: 136 mmol/L (ref 135–145)
TOTAL PROTEIN: 7 g/dL (ref 6.5–8.1)
Total Bilirubin: 0.5 mg/dL (ref 0.3–1.2)

## 2017-09-10 LAB — CBC WITH DIFFERENTIAL/PLATELET
Basophils Absolute: 0 10*3/uL (ref 0.0–0.1)
Basophils Relative: 1 %
Eosinophils Absolute: 0.2 10*3/uL (ref 0.0–0.7)
Eosinophils Relative: 2 %
HCT: 41 % (ref 36.0–46.0)
Hemoglobin: 14.2 g/dL (ref 12.0–15.0)
Lymphocytes Relative: 25 %
Lymphs Abs: 2.1 10*3/uL (ref 0.7–4.0)
MCH: 33.5 pg (ref 26.0–34.0)
MCHC: 34.6 g/dL (ref 30.0–36.0)
MCV: 96.7 fL (ref 78.0–100.0)
MONO ABS: 0.6 10*3/uL (ref 0.1–1.0)
Monocytes Relative: 7 %
NEUTROS ABS: 5.3 10*3/uL (ref 1.7–7.7)
Neutrophils Relative %: 65 %
Platelets: 268 10*3/uL (ref 150–400)
RBC: 4.24 MIL/uL (ref 3.87–5.11)
RDW: 13.8 % (ref 11.5–15.5)
WBC: 8.1 10*3/uL (ref 4.0–10.5)

## 2017-09-10 MED ORDER — SODIUM CHLORIDE 0.9 % IV BOLUS (SEPSIS)
1000.0000 mL | Freq: Once | INTRAVENOUS | Status: AC
  Administered 2017-09-10: 1000 mL via INTRAVENOUS

## 2017-09-10 MED ORDER — POTASSIUM CHLORIDE CRYS ER 20 MEQ PO TBCR
40.0000 meq | EXTENDED_RELEASE_TABLET | Freq: Once | ORAL | Status: AC
Start: 2017-09-10 — End: 2017-09-10
  Administered 2017-09-10: 40 meq via ORAL
  Filled 2017-09-10: qty 2

## 2017-09-10 MED ORDER — POTASSIUM CHLORIDE ER 10 MEQ PO TBCR: 10.0000 meq | EXTENDED_RELEASE_TABLET | Freq: Two times a day (BID) | ORAL | 0 refills | Status: DC

## 2017-09-10 MED ORDER — ACETAMINOPHEN 500 MG PO TABS
1000.0000 mg | ORAL_TABLET | Freq: Once | ORAL | Status: AC
  Administered 2017-09-10: 1000 mg via ORAL
  Filled 2017-09-10: qty 2

## 2017-09-10 NOTE — ED Notes (Signed)
Pt in radiology 

## 2017-09-10 NOTE — ED Notes (Signed)
Pacemaker interrogation complete. Results given to EDP.  No episodes found.

## 2017-09-10 NOTE — ED Triage Notes (Signed)
Patient states " I fall and pass out all the time, my heart has stopped 3 times and I have a pacemaker for this" - Patient reports that she had one of "passing out episodes" last night and she "broke the fall" with her face and knee. THe patient reports that she has pain to her right elbow, and left knee. Patient is a/o x 4

## 2017-09-10 NOTE — ED Provider Notes (Signed)
[facility] Provider Note   CSN: [id number] Arrival date & time: 09/10/17  1401     History   Chief Complaint Chief Complaint  Patient presents with  . Loss of Consciousness    HPI Brittney Cooper is a 56 y.o. female.  HPI   Last night felt lightheaded around 11PM, tried to get on the floor but fell down on knee, face and elbow prior to easing self down.  Has fallen before, felt lightheaded like this and eased self down or sometimes will fall. Did not lose consciousness last night.  Has had multiple episodes like this, has seen Cardiologist who tells her to increase salt. Had atrial pacemaker placed for it.  Is going to [facility] for second opinion. Has biotronic dual chamber pacer.   Now feeling like having palpitations and feeling sore.  Knee pain left knee 3-4/10, right elbow pain and swelling.  Facial pain. Hx of nasal surgery when 20. Eyebrow and lip also have pain. No headache.  Nose significant painful.  Not on blood thinners other than aspirin.    No nausea, vomiting, chest pain, dyspnea, black or bloody stools, diarrhea, no urinary symptoms/fever. Occasional cough.    Past Medical History:  Diagnosis Date  . ADHD (attention deficit hyperactivity disorder)   . 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 Allergy   . Anxiety   . Arthritis   . Bipolar disorder (HCC)   . Cardiac arrest (HCC) 06-2015, 08-2016   x3- pt has pacemaker   . Chronic pain    back  . Depression   . Fatty liver   . Fracture of lateral malleolus of right fibula   . Hepatomegaly   . Hyperlipidemia   . Hypertension   . OCD (obsessive compulsive disorder)   . Pancreatic pseudocyst   . Panic attack   . 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 Syncope    neurocardiogenic  . Third degree heart block [facility])     Patient Active Problem List   Diagnosis Date Noted  . Type 2 diabetes mellitus with hyperglycemia, without  long-term current use of insulin (HCC) 02/09/2017  . MDD (major depressive disorder), recurrent severe, 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  . Complete heart block (HCC)   . Palpitation 10/11/2016  . Palpitations 10/11/2016  . Acute pancreatitis 09/15/2016  . Alcohol abuse 09/15/2016  . ADHD (attention deficit hyperactivity disorder) 09/15/2016  . Tobacco use 09/15/2016  . Anxiety 09/15/2016  . Hypokalemia 09/15/2016  . Pancreatitis 07/09/2016  . Alcoholic pancreatitis 07/08/2016  . Alcohol withdrawal (HCC) 07/08/2016  . Bipolar disorder (HCC) 07/08/2016  . Essential hypertension 07/08/2016  . Left shoulder pain 09/09/2014    Past Surgical History:  Procedure Laterality Date  . ABDOMINAL HYSTERECTOMY    . ANTERIOR AND POSTERIOR REPAIR    . BUNIONECTOMY Bilateral   . CESAREAN SECTION    . EP IMPLANTABLE DEVICE N/A 10/12/2016   Procedure: Pacemaker Implant;  Surgeon: Will Jorja Loa, MD;  [facility];  Service: Cardiovascular;  Laterality: N/A;  . finfger surgery    . FINGER SURGERY    . LTCS    . MYRINGOTOMY    . ORIF ANKLE FRACTURE Right 07/25/2015   Procedure: OPEN REDUCTION INTERNAL FIXATION (ORIF) RIGHT ANKLE ;  Surgeon: Sheral Apley, MD;  Location: [facility];  Service: Orthopedics;  Laterality: Right;  . PACEMAKER PLACEMENT    .  RHINOPLASTY    . TONSILLECTOMY    . TUBAL LIGATION      OB History    No data available       Home Medications    Prior to Admission medications   Medication Sig Start Date End Date Taking? Authorizing Provider  acetaminophen (TYLENOL) 500 MG tablet Take 2 tablets (1,000 mg total) by mouth every 6 (six) hours as needed for mild pain. 02/09/17   Armandina Stammer I, NP  ALPRAZolam Prudy Feeler) 0.5 MG tablet Take 0.25 mg by mouth 2 (two) times daily as needed for anxiety or sleep. 1./-1 tablet 2-3 times a day     [provider]  amphetamine-dextroamphetamine (ADDERALL) 20 MG tablet Take 20 mg by mouth  2 (two) times daily.    [provider]  ARIPiprazole (ABILIFY) 5 MG tablet Take 1 tablet (5 mg total) by mouth daily. For mood control Patient taking differently: Take 15 mg by mouth daily. For mood control 02/10/17   Armandina Stammer I, NP  aspirin EC 81 MG tablet Take 1 tablet (81 mg total) by mouth daily. For heart health 02/09/17   Armandina Stammer I, NP  atorvastatin (LIPITOR) 40 MG tablet Take 1 tablet (40 mg total) by mouth daily at 6 PM. For high cholesterol 02/09/17   Armandina Stammer I, NP  diltiazem (CARDIZEM CD) 180 MG 24 hr capsule Take 1 capsule (180 mg total) by mouth 2 (two) times daily. 04/12/17   Duke Salvia, MD  folic acid (FOLVITE) 1 MG tablet Take 1 tablet (1 mg total) by mouth daily. For folic acid replacement 02/09/17   Armandina Stammer I, NP  gabapentin (NEURONTIN) 100 MG capsule Take 1 capsule (100 mg total) by mouth 3 (three) times daily. For agitation 02/09/17   Armandina Stammer I, NP  glipiZIDE (GLUCOTROL XL) 10 MG 24 hr tablet Take 10 mg by mouth daily with breakfast.  05/25/17 05/25/18  [provider]  LORazepam (ATIVAN) 0.5 MG tablet Take 0.5 mg by mouth every 6 (six) hours as needed for anxiety.    [provider]  losartan (COZAAR) 50 MG tablet Take 1 tablet (50 mg total) by mouth daily. Patient not taking: Reported on 08/24/2017 06/23/17 09/21/17  Duke Salvia, MD  metFORMIN (GLUCOPHAGE) 500 MG tablet Take 1 tablet (500 mg total) by mouth 2 (two) times daily with a meal. For diabetes management Patient taking differently: Take 1,000 mg by mouth 2 (two) times daily with a meal. For diabetes management 02/09/17   Armandina Stammer I, NP  Multiple Vitamin (MULTIVITAMIN WITH MINERALS) TABS tablet Take 1 tablet by mouth daily. For low Vitamin replacement 02/09/17   Armandina Stammer I, NP  potassium chloride (K-DUR) 10 MEQ tablet Take 1 tablet (10 mEq total) by mouth 2 (two) times daily. 09/10/17 09/13/17  Alvira Monday, MD  sertraline (ZOLOFT) 100 MG tablet Take 1 tablet (100  mg total) by mouth 2 (two) times daily. For depression Patient taking differently: Take 200 mg by mouth daily. For depression 02/09/17   Armandina Stammer I, NP  thiamine 100 MG tablet Take 1 tablet (100 mg total) by mouth daily. For low thiamine 02/09/17   Armandina Stammer I, NP  traZODone (DESYREL) 100 MG tablet Take 200 mg by mouth at bedtime.    [provider]  vitamin E (VITAMIN E) 400 UNIT capsule Take 400 Units by mouth daily.    [provider]    Family History Family History  Problem Relation Age of Onset  .  Stroke Mother   . Kidney disease Mother   . Colon polyps Mother   . Lung cancer Father        stage IV   . Colon polyps Father   . Stomach cancer Maternal Grandfather   . Colon cancer Paternal Grandfather   . Rectal cancer Neg Hx   . Esophageal cancer Neg Hx   . Pancreatic cancer Neg Hx     Social History Social History  Substance Use Topics  . Smoking status: Current Every Day Smoker    Packs/day: 1.00    Years: 15.00    Types: Cigarettes  . Smokeless tobacco: Never Used  . Alcohol use 0.0 oz/week     Comment: drinks for couple of days, then no alcohol for days     Allergies   Codeine; Demerol [meperidine]; and Metoprolol   Review of Systems Review of Systems  Constitutional: Negative for fever.  HENT: Negative for sore throat.   Eyes: Negative for visual disturbance.  Respiratory: Negative for cough and shortness of breath.   Cardiovascular: Positive for palpitations. Negative for chest pain.  Gastrointestinal: Negative for abdominal pain, nausea and vomiting.  Genitourinary: Negative for difficulty urinating.  Musculoskeletal: Negative for back pain and neck pain.  Skin: Negative for rash.  Neurological: Positive for light-headedness. Negative for syncope and headaches.     Physical Exam Updated Vital Signs BP (!) 163/115   Pulse 73   Temp 98.3 F (36.8 C) (Oral)   Resp 17   Ht  (1.626 m)   Wt 86.2 kg (190 lb)   SpO2 100%    BMI 32.61 kg/m   Physical Exam  Constitutional: She is oriented to person, place, and time. She appears well-developed and well-nourished. No distress.  HENT:  Head: Normocephalic.  Nasal bridge tenderness Tenderness left supraorbital rim  Eyes: Conjunctivae and EOM are normal.  Normal EOM  Neck: Normal range of motion.  Cardiovascular: Normal rate, regular rhythm, normal heart sounds and intact distal pulses.  Exam reveals no gallop and no friction rub.   No murmur heard. Pulmonary/Chest: Effort normal and breath sounds normal. No respiratory distress. She has no wheezes. She has no rales.  Abdominal: Soft. She exhibits no distension. There is no tenderness. There is no guarding.  Musculoskeletal: She exhibits no edema.       Right elbow: She exhibits swelling. Tenderness found. Radial head tenderness noted.       Left knee: She exhibits swelling. She exhibits normal range of motion. Tenderness found. Patellar tendon tenderness noted.       Cervical back: She exhibits no tenderness.       Thoracic back: She exhibits no tenderness.       Lumbar back: She exhibits no tenderness.  Neurological: She is alert and oriented to person, place, and time.  Skin: Skin is warm and dry. No rash noted. She is not diaphoretic. No erythema.  Nursing note and vitals reviewed.    ED Treatments / Results  Labs (all labs ordered are listed, but only abnormal results are displayed) Labs Reviewed  COMPREHENSIVE METABOLIC PANEL - Abnormal; Notable for the following:       Result Value   Potassium 3.0 (*)    Chloride 100 (*)    Glucose, Bld 119 (*)    All other components within normal limits  CBC WITH DIFFERENTIAL/PLATELET    EKG  EKG Interpretation  Date/Time:  Saturday September 10 2017 14:21:47 EDT Ventricular Rate:  85 PR Interval:  QRS Duration: 91 QT Interval:  364 QTC Calculation: 426 R Axis:   16 Text Interpretation:  Atrial-paced complexes Ventricular premature complex  Borderline repolarization abnormality Baseline wander in lead(s) I III aVL Since prior ECG, patient now has atrial pacing Confirmed by Alvira Monday ([id number]) on 09/10/2017 3:04:06 PM       Radiology Dg Elbow Complete Right  Result Date: 09/10/2017 CLINICAL DATA:  Acute right elbow pain following fall. Initial encounter. EXAM: RIGHT ELBOW - COMPLETE 3+ VIEW COMPARISON:  07/14/2015 FINDINGS: There is no evidence of fracture, dislocation, or joint effusion. There is no evidence of arthropathy or other focal bone abnormality. Soft tissues are unremarkable. IMPRESSION: Negative. Electronically Signed   By: Harmon Pier M.D.   On: 09/10/2017 16:34   Dg Knee Complete 4 Views Left  Result Date: 09/10/2017 CLINICAL DATA:  Acute left knee pain following fall. Initial encounter. EXAM: LEFT KNEE - COMPLETE 4+ VIEW COMPARISON:  None. FINDINGS: No evidence of fracture, dislocation, or joint effusion. No evidence of arthropathy or other focal bone abnormality. Anterior soft tissue swelling noted. IMPRESSION: Soft tissue swelling without bony or joint abnormality. Electronically Signed   By: Harmon Pier M.D.   On: 09/10/2017 16:32    Procedures Procedures (including critical care time)  Medications Ordered in ED Medications  acetaminophen (TYLENOL) tablet 1,000 mg (1,000 mg Oral Given 09/10/17 1630)  sodium chloride 0.9 % bolus 1,000 mL (0 mLs Intravenous Stopped 09/10/17 1742)  potassium chloride SA (K-DUR,KLOR-CON) CR tablet 40 mEq (40 mEq Oral Given 09/10/17 1714)     Initial Impression / Assessment and Plan / ED Course  I have reviewed the triage vital signs and the nursing notes.  Pertinent labs & imaging results that were available during my care of the patient were reviewed by me and considered in my medical decision making (see chart for details).    56yo female with history of htn, hlpd, biotronic pacemaker for history of third degree heart block, DM, bipolar, episodes of lightheadedness,  presents with concern for episode of lightheadedness last night with fall.  EKG shows atrial paced rhythm.  Pacer interrogated without events.  Labs show mild hypokalemia. No other significant electrolyte problems or anemia  Given K replacement.  XR of knee and elbow without acute abnormalities. Suspect contusions. Given knee sleeve, recommend PCP follow up if pain continues.  Possible nasal bone fracture given tenderness, however no significant swelling or deformity, no nasal septal hematomas. Patient discharged in stable condition with understanding of reasons to return.   Final Clinical Impressions(s) / ED Diagnoses   Final diagnoses:  Lightheadedness  Fall, initial encounter  Acute pain of left knee  Right elbow pain  Hypokalemia    New Prescriptions Discharge Medication List as of 09/10/2017  5:16 PM    START taking these medications   Details  potassium chloride (K-DUR) 10 MEQ tablet Take 1 tablet (10 mEq total) by mouth 2 (two) times daily., Starting Sat 09/10/2017, Until Tue 09/13/2017, Print         Alvira Monday, MD 09/11/17 1321

## 2017-09-10 NOTE — ED Notes (Signed)
As patient is leaving triage she reports that her "heart is doing flippy floppy" - patient steady with gait to room 12 - EKG ordered

## 2017-10-11 ENCOUNTER — Ambulatory Visit (INDEPENDENT_AMBULATORY_CARE_PROVIDER_SITE_OTHER): Payer: Medicaid Other | Admitting: *Deleted

## 2017-10-11 DIAGNOSIS — I442 Atrioventricular block, complete: Secondary | ICD-10-CM

## 2017-10-11 NOTE — Progress Notes (Signed)
Remote pacemaker transmission.   

## 2017-10-14 ENCOUNTER — Encounter: Payer: Self-pay | Admitting: Cardiology

## 2017-11-03 LAB — CUP PACEART REMOTE DEVICE CHECK
Implantable Lead Implant Date: 20171017
Implantable Lead Location: 753859
Implantable Lead Serial Number: 49620910
Implantable Pulse Generator Implant Date: 20171017
Lead Channel Setting Pacing Amplitude: 3 V
Lead Channel Setting Pacing Pulse Width: 0.4 ms
MDC IDC LEAD IMPLANT DT: 20171017
MDC IDC LEAD LOCATION: 753860
MDC IDC LEAD SERIAL: 49593512
MDC IDC PG SERIAL: 68784192
MDC IDC SESS DTM: 20181108135729
MDC IDC SET LEADCHNL RA PACING AMPLITUDE: 3 V
Pulse Gen Model: 394969

## 2017-12-02 ENCOUNTER — Telehealth: Payer: Self-pay | Admitting: Gastroenterology

## 2017-12-02 NOTE — Telephone Encounter (Signed)
Ok to schedule direct for EGD

## 2017-12-02 NOTE — Telephone Encounter (Signed)
The patient has been seen by ENT. She will have surgery on Tuesday 12/06/17 for a submandibular "stone". 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 Her ENT has called her about this. She understands a biopsy is needed. The patient is established with us and prefers to keep it that way.  She is anxious to have this investigated asap. Please review her records in Care Everywhere and advise to scheduling. She has a pacemaker. She is not on anticoagulation. She does not use oxygen.

## 2017-12-02 NOTE — Telephone Encounter (Signed)
Left a message to call 

## 2017-12-16 ENCOUNTER — Encounter: Payer: Self-pay | Admitting: Gastroenterology

## 2017-12-27 HISTORY — PX: OTHER SURGICAL HISTORY: SHX169

## 2018-01-10 ENCOUNTER — Ambulatory Visit (INDEPENDENT_AMBULATORY_CARE_PROVIDER_SITE_OTHER): Payer: Medicaid Other | Admitting: *Deleted

## 2018-01-10 DIAGNOSIS — I442 Atrioventricular block, complete: Secondary | ICD-10-CM | POA: Diagnosis not present

## 2018-01-11 NOTE — Progress Notes (Signed)
Remote pacemaker transmission.   

## 2018-01-13 ENCOUNTER — Encounter: Payer: Self-pay | Admitting: Cardiology

## 2018-01-17 LAB — CUP PACEART REMOTE DEVICE CHECK
Date Time Interrogation Session: 20190122102431
Implantable Lead Location: 753859
Implantable Lead Serial Number: 49593512
MDC IDC LEAD IMPLANT DT: 20171017
MDC IDC LEAD IMPLANT DT: 20171017
MDC IDC LEAD LOCATION: 753860
MDC IDC LEAD SERIAL: 49620910
MDC IDC PG IMPLANT DT: 20171017
MDC IDC PG SERIAL: 68784192
Pulse Gen Model: 394969

## 2018-04-11 ENCOUNTER — Telehealth: Payer: Self-pay | Admitting: Cardiology

## 2018-04-11 ENCOUNTER — Ambulatory Visit (INDEPENDENT_AMBULATORY_CARE_PROVIDER_SITE_OTHER): Payer: Medicaid Other | Admitting: *Deleted

## 2018-04-11 DIAGNOSIS — I442 Atrioventricular block, complete: Secondary | ICD-10-CM | POA: Diagnosis not present

## 2018-04-11 NOTE — Telephone Encounter (Signed)
LMOVM reminding pt to send remote transmission.   

## 2018-04-12 NOTE — Progress Notes (Signed)
Remote pacemaker transmission.   

## 2018-04-13 ENCOUNTER — Encounter: Payer: Self-pay | Admitting: Cardiology

## 2018-04-25 LAB — CUP PACEART REMOTE DEVICE CHECK
Brady Statistic AP VP Percent: 0 %
Brady Statistic AP VS Percent: 38 %
Brady Statistic AS VP Percent: 0 %
Date Time Interrogation Session: 20190430055242
Implantable Lead Implant Date: 20171017
Implantable Lead Location: 753859
Implantable Lead Location: 753860
Implantable Lead Serial Number: 49593512
Implantable Lead Serial Number: 49620910
Implantable Pulse Generator Implant Date: 20171017
Lead Channel Impedance Value: 540 Ohm
Lead Channel Pacing Threshold Amplitude: 0.9 V
Lead Channel Pacing Threshold Amplitude: 1.1 V
Lead Channel Pacing Threshold Pulse Width: 0.4 ms
MDC IDC LEAD IMPLANT DT: 20171017
MDC IDC MSMT BATTERY REMAINING PERCENTAGE: 85 %
MDC IDC MSMT LEADCHNL RA IMPEDANCE VALUE: 526 Ohm
MDC IDC MSMT LEADCHNL RV PACING THRESHOLD PULSEWIDTH: 0.4 ms
MDC IDC SET LEADCHNL RA PACING AMPLITUDE: 2 V
MDC IDC SET LEADCHNL RV PACING AMPLITUDE: 2.8 V
MDC IDC SET LEADCHNL RV PACING PULSEWIDTH: 0.4 ms
MDC IDC STAT BRADY AS VS PERCENT: 62 %
MDC IDC STAT BRADY RA PERCENT PACED: 38 %
MDC IDC STAT BRADY RV PERCENT PACED: 0 %
Pulse Gen Model: 394969
Pulse Gen Serial Number: 68784192

## 2018-07-11 ENCOUNTER — Ambulatory Visit (INDEPENDENT_AMBULATORY_CARE_PROVIDER_SITE_OTHER): Payer: Medicaid Other | Admitting: *Deleted

## 2018-07-11 DIAGNOSIS — I442 Atrioventricular block, complete: Secondary | ICD-10-CM | POA: Diagnosis not present

## 2018-07-11 NOTE — Progress Notes (Signed)
Remote pacemaker transmission.   

## 2018-07-12 ENCOUNTER — Encounter: Payer: Self-pay | Admitting: Cardiology

## 2018-07-14 LAB — CUP PACEART REMOTE DEVICE CHECK
Battery Remaining Percentage: 85 %
Date Time Interrogation Session: 20190719051251
Implantable Lead Location: 753859
Implantable Lead Serial Number: 49593512
Implantable Lead Serial Number: 49620910
Implantable Pulse Generator Implant Date: 20171017
Lead Channel Pacing Threshold Amplitude: 0.9 V
Lead Channel Pacing Threshold Amplitude: 1.1 V
Lead Channel Setting Pacing Amplitude: 2.8 V
Lead Channel Setting Pacing Pulse Width: 0.4 ms
MDC IDC LEAD IMPLANT DT: 20171017
MDC IDC LEAD IMPLANT DT: 20171017
MDC IDC LEAD LOCATION: 753860
MDC IDC MSMT LEADCHNL RA IMPEDANCE VALUE: 530 Ohm
MDC IDC MSMT LEADCHNL RA PACING THRESHOLD PULSEWIDTH: 0.4 ms
MDC IDC MSMT LEADCHNL RV IMPEDANCE VALUE: 532 Ohm
MDC IDC MSMT LEADCHNL RV PACING THRESHOLD PULSEWIDTH: 0.4 ms
MDC IDC SET LEADCHNL RA PACING AMPLITUDE: 2 V
MDC IDC STAT BRADY AP VP PERCENT: 0 %
MDC IDC STAT BRADY AP VS PERCENT: 34 %
MDC IDC STAT BRADY AS VP PERCENT: 0 %
MDC IDC STAT BRADY AS VS PERCENT: 66 %
MDC IDC STAT BRADY RA PERCENT PACED: 34 %
MDC IDC STAT BRADY RV PERCENT PACED: 0 %
Pulse Gen Model: 394969
Pulse Gen Serial Number: 68784192

## 2018-07-21 ENCOUNTER — Encounter: Payer: Self-pay | Admitting: Internal Medicine

## 2018-07-21 ENCOUNTER — Ambulatory Visit: Payer: Medicaid Other | Admitting: Internal Medicine

## 2018-07-21 VITALS — BP 130/88 | HR 81 | Ht 64.0 in | Wt 181.4 lb

## 2018-07-21 DIAGNOSIS — R55 Syncope and collapse: Secondary | ICD-10-CM | POA: Diagnosis not present

## 2018-07-21 DIAGNOSIS — I442 Atrioventricular block, complete: Secondary | ICD-10-CM

## 2018-07-21 DIAGNOSIS — Z950 Presence of cardiac pacemaker: Secondary | ICD-10-CM | POA: Diagnosis not present

## 2018-07-21 LAB — CUP PACEART INCLINIC DEVICE CHECK
Brady Statistic RA Percent Paced: 41 %
Implantable Lead Implant Date: 20171017
Implantable Lead Implant Date: 20171017
Implantable Lead Location: 753859
Implantable Lead Location: 753860
Implantable Lead Serial Number: 49593512
Implantable Lead Serial Number: 49620910
Implantable Pulse Generator Implant Date: 20171017
Lead Channel Impedance Value: 565 Ohm
Lead Channel Pacing Threshold Amplitude: 1 V
Lead Channel Pacing Threshold Amplitude: 1 V
Lead Channel Pacing Threshold Amplitude: 1.1 V
Lead Channel Pacing Threshold Pulse Width: 0.4 ms
Lead Channel Pacing Threshold Pulse Width: 0.4 ms
Lead Channel Sensing Intrinsic Amplitude: 10.1 mV
Lead Channel Sensing Intrinsic Amplitude: 2.5 mV
Lead Channel Sensing Intrinsic Amplitude: 2.8 mV
Lead Channel Sensing Intrinsic Amplitude: 9.9 mV
Lead Channel Setting Pacing Amplitude: 2 V
Lead Channel Setting Pacing Amplitude: 2.8 V
Lead Channel Setting Pacing Pulse Width: 0.4 ms
MDC IDC MSMT LEADCHNL RA IMPEDANCE VALUE: 585 Ohm
MDC IDC MSMT LEADCHNL RA PACING THRESHOLD PULSEWIDTH: 0.4 ms
MDC IDC MSMT LEADCHNL RA PACING THRESHOLD PULSEWIDTH: 0.4 ms
MDC IDC MSMT LEADCHNL RV PACING THRESHOLD AMPLITUDE: 1.2 V
MDC IDC MSMT LEADCHNL RV PACING THRESHOLD AMPLITUDE: 1.2 V
MDC IDC MSMT LEADCHNL RV PACING THRESHOLD PULSEWIDTH: 0.4 ms
MDC IDC SESS DTM: 20190726143329
MDC IDC STAT BRADY RV PERCENT PACED: 1 %
Pulse Gen Model: 394969
Pulse Gen Serial Number: 68784192

## 2018-07-21 NOTE — Patient Instructions (Signed)

## 2018-07-21 NOTE — Progress Notes (Signed)
Patient Care Team: Cheron Schaumann., MD as PCP - General (Internal Medicine)   HPI  Brittney Cooper is a 57 y.o. female Seen in followup for dysautonomic syncope.She had recurrent syncope with a monitor on.This was associated with a 6 second pause 2 . She had head trauma requiring laceration. She underwent pacing with a Biotronik CLS and is now programmed on very responsive  Other contributing complex medical comorbidities outlined in the old notes. Alcoholic induced pancreatitis is noted in the old chart 9/17; suicidal ideation also reported hospitalized 2/18  Some hypertension   She has had a couple falls.  She hit her head.  Looking for a second opinion she went to see [name] over at [place].  He stopped some of her medications that he thought might be contributing to her dizziness.  An echocardiogram had been read as moderate pulmonary hypertension and she was referred for right heart catheterization.  A repeat echo was read as mild.  Right heart catheterization was not done and she returns now for further thinking about this.  [facility]   Past Medical History:  Diagnosis Date  . ADHD (attention deficit hyperactivity disorder)   . 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 Allergy   . Anxiety   . Arthritis   . Bipolar disorder (HCC)   . Cardiac arrest (HCC) 06-2015, 08-2016   x3- pt has pacemaker   . Chronic pain    back  . Depression   . Fatty liver   . Fracture of lateral malleolus of right fibula   . Hepatomegaly   . Hyperlipidemia   . Hypertension   . OCD (obsessive compulsive disorder)   . Pancreatic pseudocyst   . Panic attack   . 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 Syncope    neurocardiogenic  . Third degree heart block [facility])     Past Surgical History:  Procedure Laterality Date  . ABDOMINAL HYSTERECTOMY    . ANTERIOR AND  POSTERIOR REPAIR    . BUNIONECTOMY Bilateral   . CESAREAN SECTION    . EP IMPLANTABLE DEVICE N/A 10/12/2016   Procedure: Pacemaker Implant;  Surgeon: Will Jorja Loa, MD;  Location: [facility];  Service: Cardiovascular;  Laterality: N/A;  . finfger surgery    . FINGER SURGERY    . LTCS    . MYRINGOTOMY    . ORIF ANKLE FRACTURE Right 07/25/2015   Procedure: OPEN REDUCTION INTERNAL FIXATION (ORIF) RIGHT ANKLE ;  Surgeon: Sheral Apley, MD;  Location: [facility];  Service: Orthopedics;  Laterality: Right;  . PACEMAKER PLACEMENT    . RHINOPLASTY    . TONSILLECTOMY    . TUBAL LIGATION      Current Outpatient Medications  Medication Sig Dispense Refill  . acetaminophen (TYLENOL) 500 MG tablet Take 2 tablets (1,000 mg total) by mouth every 6 (six) hours as needed for mild pain. 30 tablet 0  . ALPRAZolam (XANAX) 0.5 MG tablet Take 0.25 mg by mouth 2 (two) times daily as needed for anxiety or sleep. 1./-1 tablet 2-3 times a day     . amphetamine-dextroamphetamine (ADDERALL) 20 MG tablet Take 20 mg by mouth 2 (two) times daily.    . ARIPiprazole (ABILIFY) 5 MG tablet Take 1 tablet (5 mg total) by mouth daily. For mood control 30 tablet 0  . aspirin  EC 81 MG tablet Take 1 tablet (81 mg total) by mouth daily. For heart health    . atorvastatin (LIPITOR) 40 MG tablet Take 1 tablet (40 mg total) by mouth daily at 6 PM. For high cholesterol    . folic acid (FOLVITE) 1 MG tablet Take 1 tablet (1 mg total) by mouth daily. For folic acid replacement 1 tablet 0  . losartan (COZAAR) 50 MG tablet Take 1 tablet (50 mg total) by mouth daily. 90 tablet 2  . metFORMIN (GLUCOPHAGE) 500 MG tablet Take 1 tablet (500 mg total) by mouth 2 (two) times daily with a meal. For diabetes management 60 tablet 0  . Multiple Vitamin (MULTIVITAMIN WITH MINERALS) TABS tablet Take 1 tablet by mouth daily. For low Vitamin replacement 1 tablet 0  . thiamine 100 MG tablet Take 1 tablet (100 mg total) by  mouth daily. For low thiamine 1 tablet 0  . traZODone (DESYREL) 100 MG tablet Take 200 mg by mouth at bedtime.    . vitamin E (VITAMIN E) 400 UNIT capsule Take 400 Units by mouth daily.    Marland Kitchen. glipiZIDE (GLUCOTROL XL) 10 MG 24 hr tablet Take 5 mg by mouth daily with breakfast.      Current Facility-Administered Medications  Medication Dose Route Frequency Provider Last Rate Last Dose  . 0.9 %  sodium chloride infusion  500 mL Intravenous Continuous Nandigam, Eleonore ChiquitoKavitha V, MD        Allergies  Allergen Reactions  . Codeine Nausea And Vomiting  . Demerol [Meperidine] Other (See Comments)    Hallucinations   . Metoprolol Other (See Comments)    nightmares      Review of Systems negative except from HPI and PMH  Physical Exam BP 130/88   Pulse 81   Ht 5\' 4"  (1.626 m)   Wt 181 lb 6.4 oz (82.3 kg)   SpO2 95%   BMI 31.14 kg/m  Well developed and nourished in no acute distress HENT normal Neck supple with JVP-flat Clear Regular rate and rhythm, no murmurs or gallops Abd-soft with active BS No Clubbing cyanosis edema Skin-warm and dry A & Oriented  Grossly normal sensory and motor function   Assessment and  Plan  Syncope neurally mediated/Dysautonomia  Complete Heart Block intermittent  Pulm hypertension   Pacemaker  -- Biotronik The patient's device was interrogated.  The information was reviewed. No changes were made in the programming.     Hypertension  The patient continues to have syncope.  We will increase her target rate from 140--150.  She is advised again to be very cognizant of prodrome and to stay well-hydrated.  She asks as to whether she needs right heart catheterization.  I will forward her echo reports to Dr. DM and see what his thoughts are as to whether it is worth pursuing.  We spent more than 50% of our >25 min visit in face to face counseling regarding the above

## 2018-09-07 IMAGING — CT CT HEAD W/O CM
3 series · 14 of 44 positions shown, 16 images · non-contrast
Comparison: 09/24/2016 head CT.

CLINICAL DATA: Blurred vision for 2 days.  Hypertension.

EXAM:
CT HEAD WITHOUT CONTRAST
TECHNIQUE: Contiguous axial images were obtained from the base of the skull
through the vertex without intravenous contrast.

[Series 2: head wo · axial · 0.39mm/px · z∈[-150,-40]mm · 8 of 27 slices shown, 10 images]
[im 3/27  brain]
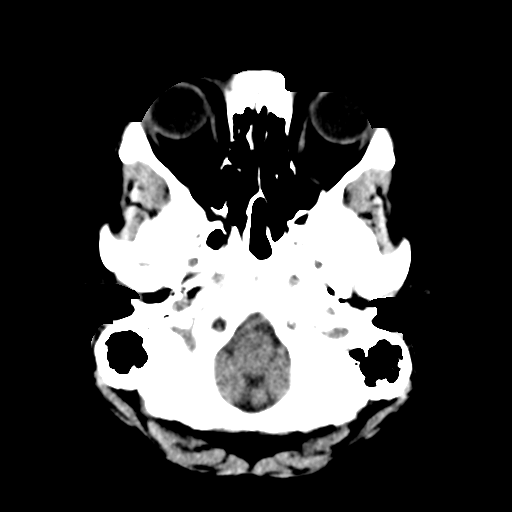
[im 3/27  bone]
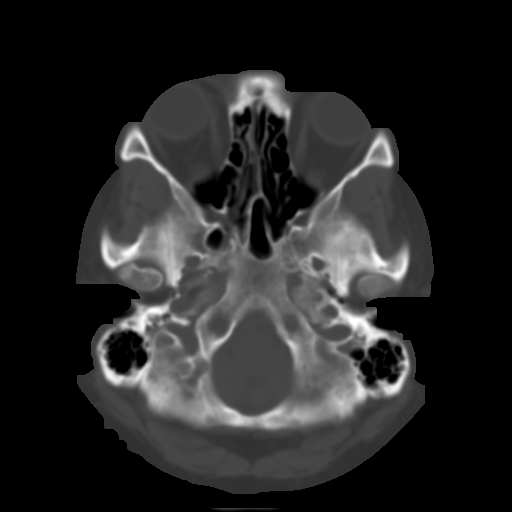
[im 6/27  brain]
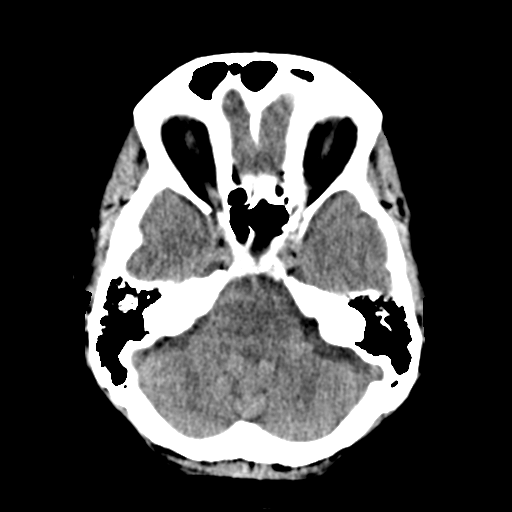
[im 9/27  brain]
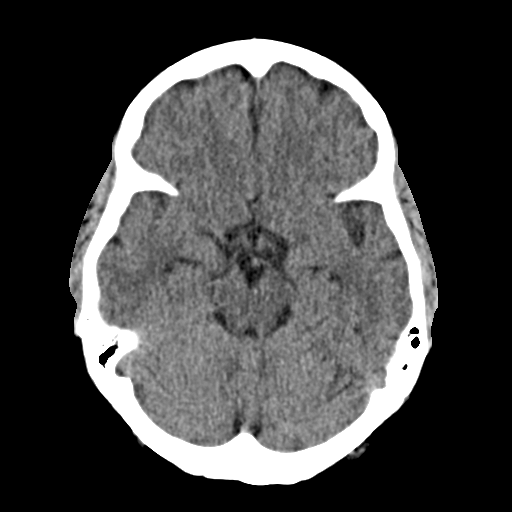
[im 12/27  brain]
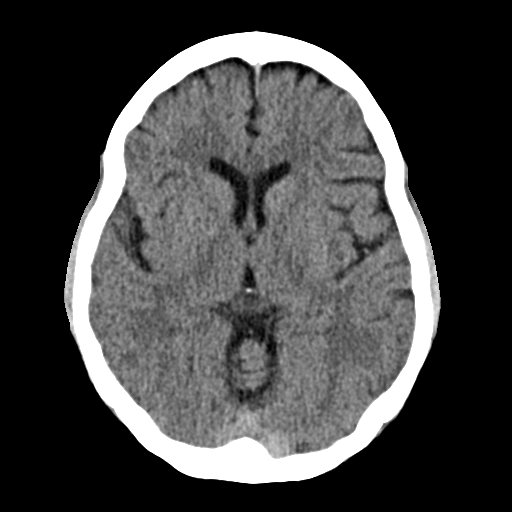
[im 16/27  brain]
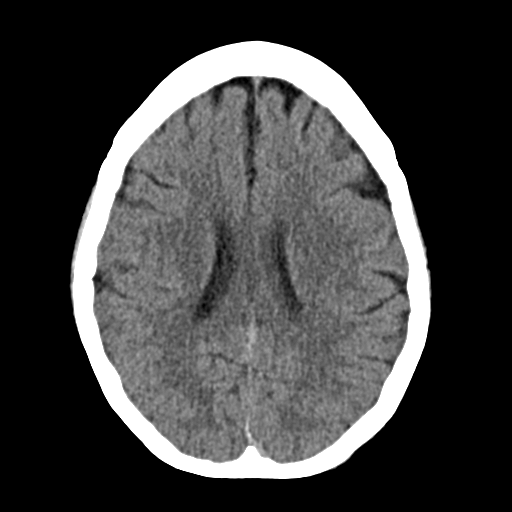
[im 16/27  bone]
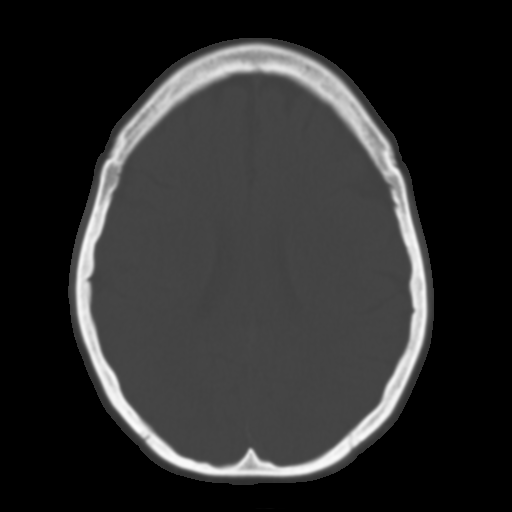
[im 19/27  brain]
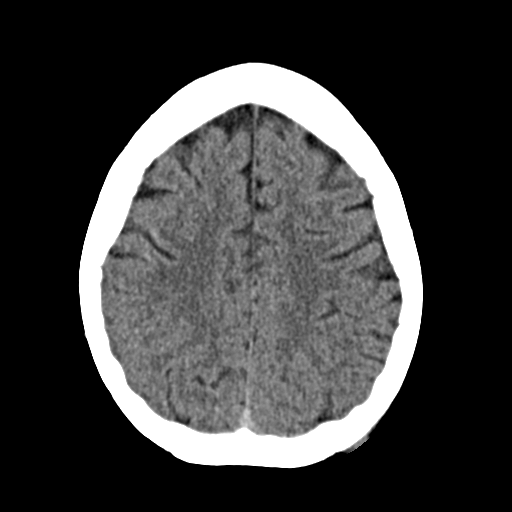
[im 22/27  brain]
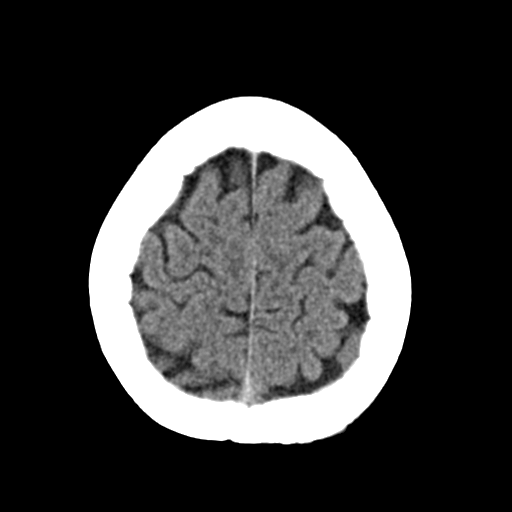
[im 25/27  brain]
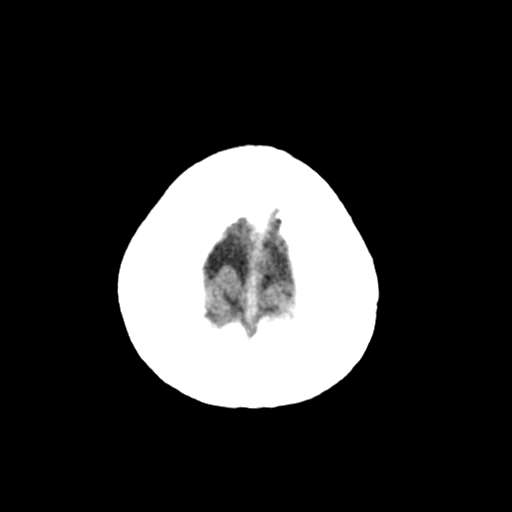

[Series 4: coronal soft · coronal · 0.27mm/px · 3 of 67 slices shown]
[im 23/67  brain]
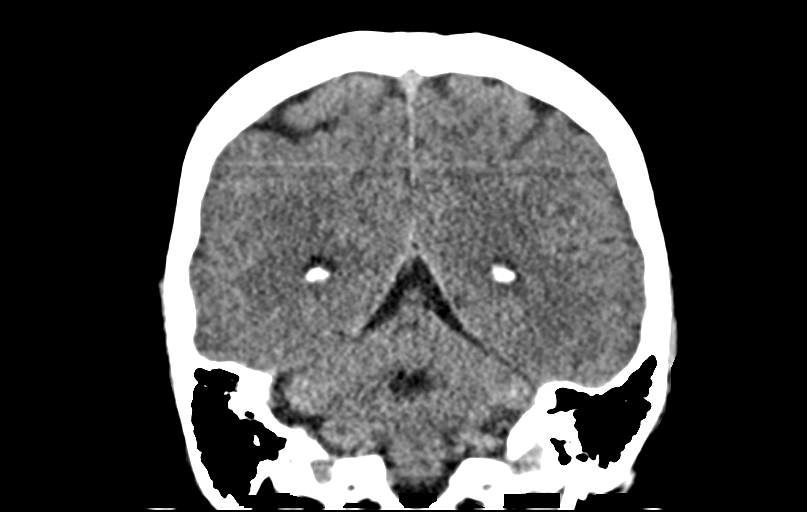
[im 30/67  brain]
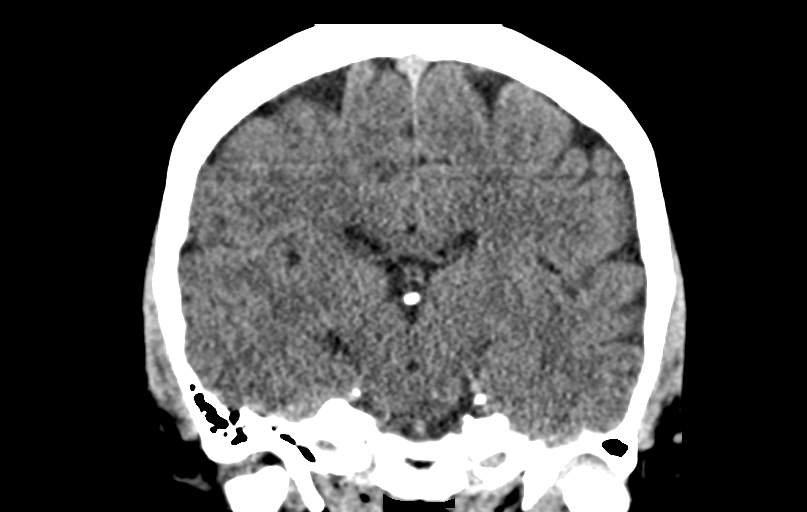
[im 37/67  brain]
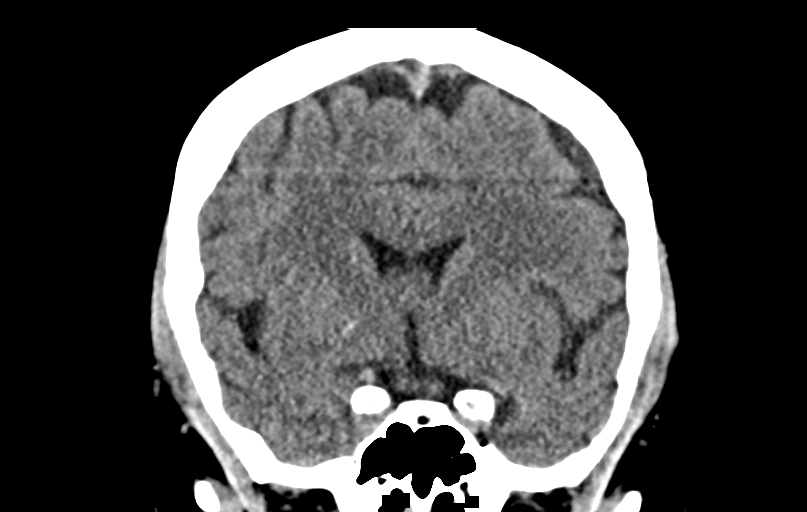

[Series 5: sag soft · sagittal · 0.28mm/px · 3 of 57 slices shown]
[im 19/57  brain]
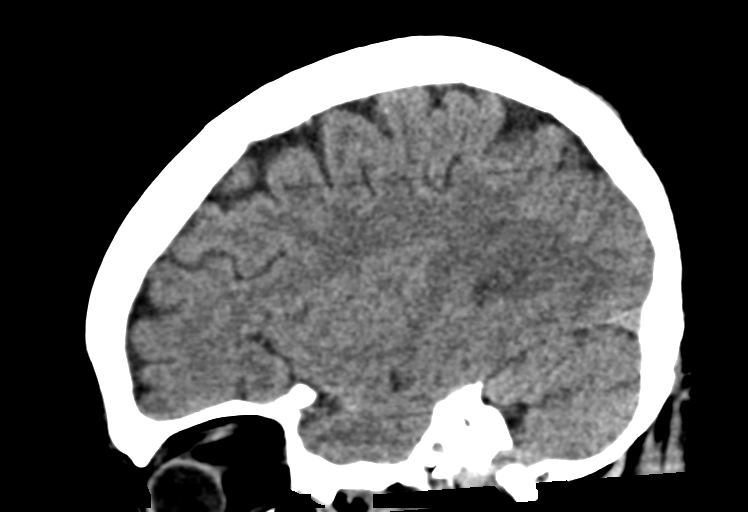
[im 29/57  brain]
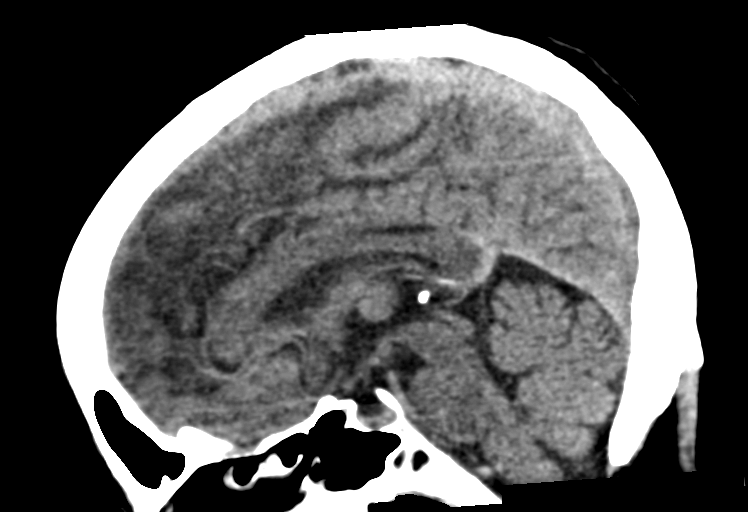
[im 38/57  brain]
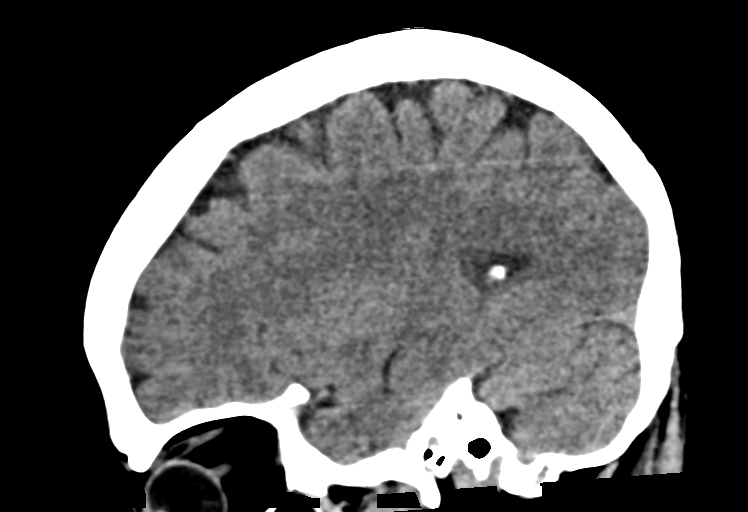

[14 of 44 positions shown; findings below may reference images not displayed]

FINDINGS: Brain: No evidence of parenchymal hemorrhage or extra-axial fluid
collection. No mass lesion, mass effect, or midline shift. No CT
evidence of acute infarction. Cerebral volume is age appropriate. No
ventriculomegaly.

Vascular: No hyperdense vessel or unexpected calcification.

Skull: No evidence of calvarial fracture.

Sinuses/Orbits: The visualized paranasal sinuses are essentially
clear.

Other: Small residual scar in the left parietal scalp at the site of
the large scalp hematoma seen on the 09/24/2016 head CT. The mastoid
air cells are unopacified.
IMPRESSION: No evidence of acute intracranial abnormality.

## 2018-09-20 ENCOUNTER — Telehealth: Payer: Self-pay | Admitting: Cardiology

## 2018-09-20 NOTE — Telephone Encounter (Signed)
Transmission received at 1am this morning- no episodes from yesterday, lead parameters stable. Patient made aware, the knot she feels is between her collar bone and her pacemaker, I advised her that she may want to have her collar bone evaluated for fracture. She verbalizes understanding and is appreciative.

## 2018-09-20 NOTE — Telephone Encounter (Signed)
Patient called and stated that she becomes dizzy a lot. She said yesterday she was dizzy and she fell and hit the corner of her dresser at the device site. She stated that there is a knot there but she isn't sure if it is related to the device or if she broke her collar bone.

## 2018-09-26 ENCOUNTER — Telehealth: Payer: Self-pay | Admitting: Internal Medicine

## 2018-09-26 DIAGNOSIS — I272 Pulmonary hypertension, unspecified: Secondary | ICD-10-CM

## 2018-09-26 NOTE — Telephone Encounter (Signed)
New message   Patient states that she needs a referral to the pulmonary team. Please call to discuss.

## 2018-09-27 NOTE — Telephone Encounter (Signed)
Spoke with pt today. She was wondering if Dr Graciela Husbands has had Dr Shirlee Latch review her echo results from a few months ago. She would like to address her PHTN. If Dr Graciela Husbands thinks she needs a right heart, she would like to move forward.  I have advised pt that Dr Graciela Husbands is out of the office until next week. But I would follow up with her next week when he returns.

## 2018-10-03 NOTE — Telephone Encounter (Signed)
Per Dr Graciela Husbands, he would like pt to have a more recent Echo performed for further evaluation. Pt has agreed with this and will await scheduling to call her.

## 2018-10-03 NOTE — Addendum Note (Signed)
Addended by: Oretha Milch on: 10/03/2018 05:28 PM   Modules accepted: Orders

## 2018-10-09 ENCOUNTER — Encounter: Payer: Self-pay | Admitting: Cardiology

## 2018-10-10 ENCOUNTER — Ambulatory Visit (INDEPENDENT_AMBULATORY_CARE_PROVIDER_SITE_OTHER): Payer: Medicaid Other | Admitting: *Deleted

## 2018-10-10 DIAGNOSIS — R55 Syncope and collapse: Secondary | ICD-10-CM

## 2018-10-11 NOTE — Progress Notes (Signed)
Remote pacemaker transmission.   

## 2018-10-13 ENCOUNTER — Ambulatory Visit (HOSPITAL_BASED_OUTPATIENT_CLINIC_OR_DEPARTMENT_OTHER)
Admission: RE | Admit: 2018-10-13 | Discharge: 2018-10-13 | Disposition: A | Discharge status: Home / Self Care | Payer: Medicaid Other | Source: Ambulatory Visit | Attending: Internal Medicine | Admitting: Internal Medicine

## 2018-10-13 DIAGNOSIS — I272 Pulmonary hypertension, unspecified: Secondary | ICD-10-CM | POA: Diagnosis not present

## 2018-10-13 NOTE — Progress Notes (Signed)
  Echocardiogram 2D Echocardiogram has been performed.  Yuriana Gaal T Zaynab Chipman 10/13/2018, 12:20 PM

## 2018-10-20 ENCOUNTER — Telehealth: Payer: Self-pay | Admitting: Internal Medicine

## 2018-10-20 NOTE — Telephone Encounter (Signed)
Patient is calling for results to her Echo.  

## 2018-12-16 LAB — CUP PACEART REMOTE DEVICE CHECK
Date Time Interrogation Session: 20191221200637
Implantable Lead Implant Date: 20171017
Implantable Lead Implant Date: 20171017
Implantable Lead Location: 753859
Implantable Lead Serial Number: 49593512
Implantable Pulse Generator Implant Date: 20171017
MDC IDC LEAD LOCATION: 753860
MDC IDC LEAD SERIAL: 49620910
Pulse Gen Model: 394969
Pulse Gen Serial Number: 68784192

## 2018-12-30 ENCOUNTER — Encounter (HOSPITAL_BASED_OUTPATIENT_CLINIC_OR_DEPARTMENT_OTHER): Payer: Self-pay | Admitting: Emergency Medicine

## 2018-12-30 ENCOUNTER — Emergency Department (HOSPITAL_BASED_OUTPATIENT_CLINIC_OR_DEPARTMENT_OTHER)
Admission: EM | Admit: 2018-12-30 | Discharge: 2018-12-30 | Disposition: A | Discharge status: Home / Self Care | Payer: Medicaid Other | Attending: Emergency Medicine | Admitting: Emergency Medicine

## 2018-12-30 ENCOUNTER — Other Ambulatory Visit: Payer: Self-pay

## 2018-12-30 ENCOUNTER — Emergency Department (HOSPITAL_BASED_OUTPATIENT_CLINIC_OR_DEPARTMENT_OTHER): Payer: Medicaid Other

## 2018-12-30 DIAGNOSIS — S92414A Nondisplaced fracture of proximal phalanx of right great toe, initial encounter for closed fracture: Secondary | ICD-10-CM | POA: Insufficient documentation

## 2018-12-30 DIAGNOSIS — Z79899 Other long term (current) drug therapy: Secondary | ICD-10-CM | POA: Insufficient documentation

## 2018-12-30 DIAGNOSIS — W1830XA Fall on same level, unspecified, initial encounter: Secondary | ICD-10-CM | POA: Diagnosis not present

## 2018-12-30 DIAGNOSIS — Y999 Unspecified external cause status: Secondary | ICD-10-CM | POA: Insufficient documentation

## 2018-12-30 DIAGNOSIS — S92411A Displaced fracture of proximal phalanx of right great toe, initial encounter for closed fracture: Secondary | ICD-10-CM

## 2018-12-30 DIAGNOSIS — Y939 Activity, unspecified: Secondary | ICD-10-CM | POA: Insufficient documentation

## 2018-12-30 DIAGNOSIS — S90931A Unspecified superficial injury of right great toe, initial encounter: Secondary | ICD-10-CM | POA: Diagnosis present

## 2018-12-30 DIAGNOSIS — Y929 Unspecified place or not applicable: Secondary | ICD-10-CM | POA: Diagnosis not present

## 2018-12-30 DIAGNOSIS — F17200 Nicotine dependence, unspecified, uncomplicated: Secondary | ICD-10-CM | POA: Diagnosis not present

## 2018-12-30 DIAGNOSIS — Z7982 Long term (current) use of aspirin: Secondary | ICD-10-CM | POA: Diagnosis not present

## 2018-12-30 NOTE — ED Provider Notes (Signed)
[facility] EMERGENCY DEPARTMENT Provider Note   CSN: [id number] Arrival date & time: 12/30/18  4098     History   Chief Complaint Chief Complaint  Patient presents with  . Fall    HPI Brittney Cooper is a 58 y.o. female.  58 yo F with a cc of right foot pain.  Patient had a syncopal event.  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  She has seen 2 different cardiologist for this.  Has a pacemaker in place.  Patient felt the symptoms were similar to previous she was standing in the kitchen and suddenly felt lightheaded and did not lay down quick enough.  She bumped the left side of her head and banged her right upper back and her left foot on the ground.  She denies confusion denies vomiting.  She denies neck pain.  She is now that concerned about her right upper back pain.  Complaining worse about her right foot pain.  Has been able to ambulate on it but it is painful.  The history is provided by the patient.  Fall  This is a new problem. The current episode started yesterday. The problem occurs constantly. The problem has not changed since onset.Pertinent negatives include no chest pain, no abdominal pain, no headaches and no shortness of breath. The symptoms are aggravated by bending, twisting and walking. Nothing relieves the symptoms. She has tried nothing for the symptoms. The treatment provided no relief.    Past Medical History:  Diagnosis Date  . ADHD (attention deficit hyperactivity disorder)   . 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 Allergy   . Anxiety   . Arthritis   . Bipolar disorder (HCC)   . Cardiac arrest (HCC) 06-2015, 08-2016   x3- pt has pacemaker   . Chronic pain    back  . Depression   . Fatty liver   . Fracture of lateral malleolus of right fibula   . Hepatomegaly   . Hyperlipidemia   . Hypertension   . OCD (obsessive compulsive disorder)   . Pancreatic pseudocyst   . Panic attack   . 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 Syncope    neurocardiogenic  . Third degree heart block [facility])     Patient Active Problem List   Diagnosis Date Noted  . Type 2 diabetes mellitus with hyperglycemia, without long-term current use of insulin (HCC) 02/09/2017  . MDD (major depressive disorder), recurrent severe, 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  . Complete heart block (HCC)   . Palpitation 10/11/2016  . Palpitations 10/11/2016  . Acute pancreatitis 09/15/2016  . Alcohol abuse 09/15/2016  . ADHD (attention deficit hyperactivity disorder) 09/15/2016  . Tobacco use 09/15/2016  . Anxiety 09/15/2016  . Hypokalemia 09/15/2016  . Pancreatitis 07/09/2016  . Alcoholic pancreatitis 07/08/2016  . Alcohol withdrawal (HCC) 07/08/2016  . Bipolar disorder (HCC) 07/08/2016  . Essential hypertension 07/08/2016  . Left shoulder pain 09/09/2014    Past Surgical History:  Procedure Laterality Date  . ABDOMINAL HYSTERECTOMY    . ANTERIOR AND POSTERIOR REPAIR    . BUNIONECTOMY Bilateral   . CESAREAN SECTION    . EP IMPLANTABLE DEVICE N/A 10/12/2016   Procedure: Pacemaker Implant;  Surgeon: Will Jorja Loa, MD;  Location: [facility] INVASIVE CV LAB;  Service: Cardiovascular;  Laterality: N/A;  . finfger surgery    . FINGER SURGERY    .  LTCS    . MYRINGOTOMY    . ORIF ANKLE FRACTURE Right 07/25/2015   Procedure: OPEN REDUCTION INTERNAL FIXATION (ORIF) RIGHT ANKLE ;  Surgeon: Sheral Apleyimothy D Murphy, MD;  Location: [facility];  Service: Orthopedics;  Laterality: Right;  . PACEMAKER PLACEMENT    . RHINOPLASTY    . TONSILLECTOMY    . TUBAL LIGATION       OB History   No obstetric history on file.      Home Medications    Prior to Admission medications   Medication Sig Start Date End Date Taking? Authorizing Provider  acetaminophen (TYLENOL) 500 MG tablet Take 2 tablets (1,000 mg total) by mouth every 6 (six) hours  as needed for mild pain. 02/09/17   Armandina StammerNwoko, Agnes I, NP  ALPRAZolam Prudy Feeler(XANAX) 0.5 MG tablet Take 0.25 mg by mouth 2 (two) times daily as needed for anxiety or sleep. 1./-1 tablet 2-3 times a day     [provider]  amphetamine-dextroamphetamine (ADDERALL) 20 MG tablet Take 20 mg by mouth 2 (two) times daily.    [provider]  ARIPiprazole (ABILIFY) 5 MG tablet Take 1 tablet (5 mg total) by mouth daily. For mood control 02/10/17   Armandina StammerNwoko, Agnes I, NP  aspirin EC 81 MG tablet Take 1 tablet (81 mg total) by mouth daily. For heart health 02/09/17   Armandina StammerNwoko, Agnes I, NP  atorvastatin (LIPITOR) 40 MG tablet Take 1 tablet (40 mg total) by mouth daily at 6 PM. For high cholesterol 02/09/17   Armandina StammerNwoko, Agnes I, NP  folic acid (FOLVITE) 1 MG tablet Take 1 tablet (1 mg total) by mouth daily. For folic acid replacement 02/09/17   Armandina StammerNwoko, Agnes I, NP  glipiZIDE (GLUCOTROL XL) 10 MG 24 hr tablet Take 5 mg by mouth daily with breakfast.  05/25/17 05/25/18  [provider]  losartan (COZAAR) 50 MG tablet Take 1 tablet (50 mg total) by mouth daily. 06/23/17   Duke SalviaKlein, Steven C, MD  metFORMIN (GLUCOPHAGE) 500 MG tablet Take 1 tablet (500 mg total) by mouth 2 (two) times daily with a meal. For diabetes management 02/09/17   Armandina StammerNwoko, Agnes I, NP  Multiple Vitamin (MULTIVITAMIN WITH MINERALS) TABS tablet Take 1 tablet by mouth daily. For low Vitamin replacement 02/09/17   Armandina StammerNwoko, Agnes I, NP  thiamine 100 MG tablet Take 1 tablet (100 mg total) by mouth daily. For low thiamine 02/09/17   Armandina StammerNwoko, Agnes I, NP  traZODone (DESYREL) 100 MG tablet Take 200 mg by mouth at bedtime.    [provider]  vitamin E (VITAMIN E) 400 UNIT capsule Take 400 Units by mouth daily.    [provider]    Family History Family History  Problem Relation Age of Onset  . Stroke Mother   . Kidney disease Mother   . Colon polyps Mother   . Lung cancer Father        stage IV   . Colon polyps Father   . Stomach cancer  Maternal Grandfather   . Colon cancer Paternal Grandfather   . Rectal cancer Neg Hx   . Esophageal cancer Neg Hx   . Pancreatic cancer Neg Hx     Social History Social History   Tobacco Use  . Smoking status: Current Every Day Smoker    Packs/day: 1.00    Years: 15.00    Pack years: 15.00    Types: Cigarettes  . Smokeless tobacco: Never Used  Substance Use Topics  . Alcohol  use: Yes    Alcohol/week: 0.0 standard drinks    Comment: drinks for couple of days, then no alcohol for days  . Drug use: No     Allergies   Codeine; Demerol [meperidine]; and Metoprolol   Review of Systems Review of Systems  Respiratory: Negative for shortness of breath.   Cardiovascular: Negative for chest pain.  Gastrointestinal: Negative for abdominal pain.  Neurological: Negative for headaches.     Physical Exam Updated Vital Signs BP (!) 132/98 (BP Location: Left Arm)   Pulse 92   Temp 98.4 F (36.9 C) (Oral)   Resp 18   Ht 5\' 4"  (1.626 m)   Wt 79.4 kg   SpO2 99%   BMI 30.04 kg/m   Physical Exam   ED Treatments / Results  Labs (all labs ordered are listed, but only abnormal results are displayed) Labs Reviewed - No data to display  EKG None  Radiology Dg Foot Complete Right  Result Date: 12/30/2018 CLINICAL DATA:  Fall yesterday with right great toe pain and medial right foot pain. EXAM: RIGHT FOOT COMPLETE - 3+ VIEW COMPARISON:  None. FINDINGS: There is a displaced comminuted fracture of the mid to distal aspect of the first proximal phalanx with extension to the articular surface. No additional acute fractures identified. Small inferior calcaneal spur. Mild degenerative change over the midfoot/hindfoot region. Hardware intact over the distal first metatarsal and distal fibula. IMPRESSION: Displaced comminuted fracture of the mid to distal aspect of the first proximal phalanx with involvement of the articular surface. Electronically Signed   By: Elberta Fortisaniel  Boyle M.D.   On:  12/30/2018 10:25    Procedures Procedures (including critical care time)  Medications Ordered in ED Medications - No data to display   Initial Impression / Assessment and Plan / ED Course  I have reviewed the triage vital signs and the nursing notes.  Pertinent labs & imaging results that were available during my care of the patient were reviewed by me and considered in my medical decision making (see chart for details).     58 yo F with a chief complaint of syncopal events.  This happened yesterday.  She is complaining mostly of right foot pain.  She does have pain about the navicular, will obtain a plain film.  She is well-appearing and nontoxic and has no focal neurologic deficit.  I do not feel that I need to image her head.  No significant tenderness to her right upper thoracic back. Plain films viewed by me with the proximal first phalanx fracture of the right foot.  Does extend into the joint.  Given orthopedic follow-up.  Placed in a postop shoe and crutches.  10:45 AM:  I have discussed the diagnosis/risks/treatment options with the patient and believe the pt to be eligible for discharge home to follow-up with Ortho. We also discussed returning to the ED immediately if new or worsening sx occur. We discussed the sx which are most concerning (e.g., sudden worsening pain, inability to walk) that necessitate immediate return. Medications administered to the patient during their visit and any new prescriptions provided to the patient are listed below.  Medications given during this visit Medications - No data to display    The patient appears reasonably screen and/or stabilized for discharge and I doubt any other medical condition or other [facility] requiring further screening, evaluation, or treatment in the ED at this time prior to discharge.    Final Clinical Impressions(s) / ED Diagnoses   Final  diagnoses:  Closed displaced fracture of proximal phalanx of right great toe, initial  encounter    ED Discharge Orders    None       Melene Plan, DO 12/30/18 1045

## 2018-12-30 NOTE — ED Triage Notes (Signed)
Pt reports she gets dizzy a lot due to her pacemaker which caused her to fall last night. C/o R big toe pain. She hit her head but denies other injury.

## 2018-12-30 NOTE — Discharge Instructions (Signed)
Follow up with your podiatrist or an ortho for the injury.  It may require surgery.   Take 4 over the counter ibuprofen tablets 3 times a day or 2 over-the-counter naproxen tablets twice a day for pain. Also take tylenol 1000mg (2 extra strength) four times a day.

## 2019-01-09 ENCOUNTER — Ambulatory Visit (INDEPENDENT_AMBULATORY_CARE_PROVIDER_SITE_OTHER): Payer: Medicaid Other

## 2019-01-09 DIAGNOSIS — R55 Syncope and collapse: Secondary | ICD-10-CM

## 2019-01-10 NOTE — Progress Notes (Signed)
Remote pacemaker transmission.   

## 2019-01-12 LAB — CUP PACEART REMOTE DEVICE CHECK
Date Time Interrogation Session: 20200117202452
Implantable Lead Implant Date: 20171017
Implantable Lead Location: 753860
Implantable Lead Serial Number: 49593512
MDC IDC LEAD IMPLANT DT: 20171017
MDC IDC LEAD LOCATION: 753859
MDC IDC LEAD SERIAL: 49620910
MDC IDC PG IMPLANT DT: 20171017
Pulse Gen Model: 394969
Pulse Gen Serial Number: 68784192

## 2019-02-25 DIAGNOSIS — K2289 Other specified disease of esophagus: Secondary | ICD-10-CM

## 2019-02-25 HISTORY — DX: Other specified disease of esophagus: K22.89

## 2019-03-02 ENCOUNTER — Telehealth: Payer: Self-pay | Admitting: Internal Medicine

## 2019-03-02 NOTE — Telephone Encounter (Signed)
Spoke with pt today who states she has had two episodes of syncope this year. Recently with an injury to her foot. She is unable to send in a remote check to review device function as she has a Biotronik device.   Pt will take an appt for 3/16 with Graciela Husbands. She is encouraged to increase her fluids until she urinates clear yellow every 2hrs, avoid caffeine, increase her sodium intake and wear abdominal compression gear. I recommended pt look into oral hydration supplements like "Trioral." She has agreed with this plan and will contact us if anything changes. She has no additional questions.

## 2019-03-02 NOTE — Telephone Encounter (Signed)
New Message    Pt c/o Syncope: STAT if syncope occurred within 30 minutes and pt complains of lightheadedness [facility] if episode of passing out, completely, today or in last 24 hours   1. Did you pass out today? No, Last night patient passed out around 1 or 2 in the morning.  2. When is the last time you passed out? Last night about 1or 2 in the morning  3. Has this occurred multiple times? Yes, back in January patient passed out.  4. Did you have any symptoms prior to passing out? No just hit's her all of a sudden, but patient states she might have had some palpatations.

## 2019-03-09 ENCOUNTER — Other Ambulatory Visit: Payer: Self-pay

## 2019-03-09 ENCOUNTER — Ambulatory Visit (INDEPENDENT_AMBULATORY_CARE_PROVIDER_SITE_OTHER): Payer: Medicaid Other | Admitting: Internal Medicine

## 2019-03-09 ENCOUNTER — Encounter: Payer: Self-pay | Admitting: Internal Medicine

## 2019-03-09 VITALS — BP 143/101 | HR 78 | Ht 64.0 in | Wt 167.4 lb

## 2019-03-09 DIAGNOSIS — Z950 Presence of cardiac pacemaker: Secondary | ICD-10-CM | POA: Diagnosis not present

## 2019-03-09 DIAGNOSIS — R55 Syncope and collapse: Secondary | ICD-10-CM

## 2019-03-09 DIAGNOSIS — I442 Atrioventricular block, complete: Secondary | ICD-10-CM | POA: Diagnosis not present

## 2019-03-09 MED ORDER — MIDODRINE HCL 5 MG PO TABS: 5.0000 mg | ORAL_TABLET | ORAL | 3 refills | Status: DC

## 2019-03-09 MED ORDER — NEBIVOLOL HCL 2.5 MG PO TABS: 2.5000 mg | ORAL_TABLET | Freq: Every day | ORAL | 3 refills | Status: DC

## 2019-03-09 NOTE — Progress Notes (Signed)
Patient Care Team: Cheron Schaumann., MD as PCP - General (Internal Medicine)   HPI  Brittney Cooper is a 58 y.o. female Seen in followup for dysautonomic syncope.She had recurrent syncope with a monitor on.This was associated with a 6 second pause 2 . She had head trauma requiring laceration. She underwent pacing with a Biotronik CLS and is now programmed on very responsive  Other contributing complex medical comorbidities outlined in the old notes. Alcoholic induced pancreatitis is noted in the old chart 9/17; suicidal ideation also reported hospitalized 2/18  Some hypertension   She has had a couple falls.  She hit her head.  Looking for a second opinion she went to see [name] over at [facility].  He stopped some of her medications that he thought might be contributing to her dizziness.  An echocardiogram had been read as moderate pulmonary hypertension and she was referred for right heart catheterization.  A repeat echo was read as mild.  She is seen today because of recurrent syncope. Three recent spells, each with insufficient warning.  She injured herself on 2 occasions.  One occurred following micturition.  No longer diabetic.  No longer on losartan.  Unable to tolerate metoprolol because of nightmares.  No chest pain or edema.  Blood pressures are variable at home with some orthostatics 11 as this is 0--70 as well as some systolics over 190.  [facility]   Past [facility] History:  Diagnosis Date  . ADHD (attention deficit hyperactivity disorder)   . 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 Allergy   . Anxiety   . Arthritis   . Bipolar disorder (HCC)   . Cardiac arrest (HCC) 06-2015, 08-2016   x3- pt has pacemaker   . Chronic pain    back  . Depression   . Fatty liver   . Fracture of lateral malleolus of right fibula   . Hepatomegaly   . Hyperlipidemia   . Hypertension   . OCD (obsessive compulsive disorder)   .  Pancreatic pseudocyst   . Panic attack   . 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 Syncope    neurocardiogenic  . Third degree heart block [facility])     Past Surgical History:  Procedure Laterality Date  . ABDOMINAL HYSTERECTOMY    . ANTERIOR AND POSTERIOR REPAIR    . BUNIONECTOMY Bilateral   . CESAREAN SECTION    . EP IMPLANTABLE DEVICE N/A 10/12/2016   Procedure: Pacemaker Implant;  Surgeon: Will Jorja Loa, MD;  Location: [facility] INVASIVE CV LAB;  Service: Cardiovascular;  Laterality: N/A;  . finfger surgery    . FINGER SURGERY    . LTCS    . MYRINGOTOMY    . ORIF ANKLE FRACTURE Right 07/25/2015   Procedure: OPEN REDUCTION INTERNAL FIXATION (ORIF) RIGHT ANKLE ;  Surgeon: Sheral Apley, MD;  Location: [facility];  Service: Orthopedics;  Laterality: Right;  . PACEMAKER PLACEMENT    . RHINOPLASTY    . TONSILLECTOMY    . TUBAL LIGATION      Current Outpatient Medications  Medication Sig Dispense Refill  . acetaminophen (TYLENOL) 500 MG tablet Take 2 tablets (1,000 mg total) by mouth every 6 (six) hours as needed for mild pain. 30 tablet 0  . ALPRAZolam (XANAX) 0.5 MG tablet Take 0.25 mg by mouth 2 (two) times daily as  needed for anxiety or sleep. 1./-1 tablet 2-3 times a day     . ARIPiprazole (ABILIFY) 10 MG tablet Take 1 tablet by mouth daily.    Marland Kitchen aspirin EC 81 MG tablet Take 1 tablet (81 mg total) by mouth daily. For heart health    . glipiZIDE (GLUCOTROL XL) 10 MG 24 hr tablet Take 5 mg by mouth daily with breakfast.     . metFORMIN (GLUCOPHAGE) 500 MG tablet Take 1 tablet (500 mg total) by mouth 2 (two) times daily with a meal. For diabetes management 60 tablet 0  . Multiple Vitamin (MULTIVITAMIN WITH MINERALS) TABS tablet Take 1 tablet by mouth daily. For low Vitamin replacement 1 tablet 0  . thiamine 100 MG tablet Take 1 tablet (100 mg total) by mouth daily. For low thiamine 1 tablet 0  .  traZODone (DESYREL) 100 MG tablet Take 200 mg by mouth at bedtime.    . vitamin E (VITAMIN E) 400 UNIT capsule Take 400 Units by mouth daily.    . midodrine (PROAMATINE) 5 MG tablet Take 1 tablet (5 mg total) by mouth every 4 (four) hours while awake. 420 tablet 3  . nebivolol (BYSTOLIC) 2.5 MG tablet Take 1 tablet (2.5 mg total) by mouth daily. 90 tablet 3   Current Facility-Administered Medications  Medication Dose Route Frequency Provider Last Rate Last Dose  . 0.9 %  sodium chloride infusion  500 mL Intravenous Continuous Nandigam, Eleonore Chiquito, MD        Allergies  Allergen Reactions  . Codeine Nausea And Vomiting  . Demerol [Meperidine] Other (See Comments)    Hallucinations   . Metoprolol Other (See Comments)    nightmares      Review of Systems negative except from HPI and PMH  Physical Exam BP (!) 143/101   Pulse 78   Ht 5\' 4"  (1.626 m)   Wt 167 lb 6.4 oz (75.9 kg)   SpO2 95%   BMI 28.73 kg/m  Well developed and nourished in no acute distress HENT normal Neck supple with JVP-flat Clear Regular rate and rhythm, no murmurs or gallops Abd-soft with active BS No Clubbing cyanosis edema Skin-warm and dry A & Oriented  Grossly normal sensory and motor function  Sinus at 78 Intervals 14/08/36 T wave flattening  Assessment and  Plan  Syncope neurally mediated/Dysautonomia  Complete Heart Block intermittent  Pulm hypertension   Pacemaker  -- Biotronik The patient's device was interrogated.  The information was reviewed. No changes were made in the programming.     Hypertension  She continues to have syncope.  There is no further room I think it for device programming.  I suggested we use compressive wear as suggested thigh sleeves.  An abdominal binder would also be potentially helpful.  Also will use ProAmatine on an as-needed basis.  She will continue to check her blood pressures at home.  In the event that she is under 120 she will use her ProAmatine every  4 hours while awake.  Discussed again the physiology of her dysautonomia and her diagnosis.  I do not think that her heart block is primary.   We spent more than 50% of our >25 min visit in face to face counseling regarding the above

## 2019-03-09 NOTE — Patient Instructions (Signed)
Medication Instructions:  Your physician has recommended you make the following change in your medication:   1. Begin Bystolic, 2.5mg  tablet, once daily 2. Begin Midodrine, 5mg  tablet, every 4 hrs while awake  Labwork: None ordered.  Testing/Procedures: None ordered.  Follow-Up: Your physician recommends that you schedule a follow-up appointment in:   6 months with Dr Graciela Husbands  Any Other Special Instructions Will Be Listed Below (If Applicable).     If you need a refill on your cardiac medications before your next appointment, please call your pharmacy.

## 2019-03-12 ENCOUNTER — Ambulatory Visit: Payer: Medicaid Other | Admitting: Internal Medicine

## 2019-03-12 ENCOUNTER — Telehealth: Payer: Self-pay

## 2019-03-12 NOTE — Telephone Encounter (Signed)
**Note De-Identified  Obfuscation** Per [facility] the pt must first try and fail Atenolol, Bisoprolol, Carvedilol, and Metoprolol before granting an approval for this request for Bystolic. According to the pts chart she has tried Metoprolol in the past but none of the other meds listed.  Will forward this message to Dr Graciela Husbands and his nurse for advisement to the pt.

## 2019-03-13 NOTE — Telephone Encounter (Signed)
Pt agrees to do a beta blocker trial to see which beta blocker would work for her. We will send paper scripts through the mail after Dr Graciela Husbands gives dosage orders.

## 2019-03-15 NOTE — Telephone Encounter (Signed)
Per Dr Graciela Husbands, pt may try the following:  Inderal LA, 60mg  tablet, once daily  Atenolol, 12.5mg  tablet, once daily  Bisoprolol, 2.5mg  tablet, once daily.  Handwritten scripts have been sent to pt with instructions.

## 2019-04-10 ENCOUNTER — Ambulatory Visit (INDEPENDENT_AMBULATORY_CARE_PROVIDER_SITE_OTHER): Payer: Medicaid Other | Admitting: *Deleted

## 2019-04-10 ENCOUNTER — Other Ambulatory Visit: Payer: Self-pay

## 2019-04-10 DIAGNOSIS — I442 Atrioventricular block, complete: Secondary | ICD-10-CM

## 2019-04-10 LAB — CUP PACEART REMOTE DEVICE CHECK
Date Time Interrogation Session: 20200414203836
Implantable Lead Implant Date: 20171017
Implantable Lead Implant Date: 20171017
Implantable Lead Location: 753859
Implantable Lead Location: 753860
Implantable Lead Model: 377
Implantable Lead Model: 377
Implantable Lead Serial Number: 49593512
Implantable Lead Serial Number: 49620910
Implantable Pulse Generator Implant Date: 20171017
Pulse Gen Model: 394969
Pulse Gen Serial Number: 68784192

## 2019-04-12 ENCOUNTER — Telehealth: Payer: Self-pay | Admitting: *Deleted

## 2019-04-12 MED ORDER — PROPRANOLOL HCL ER 60 MG PO CP24: 60.0000 mg | ORAL_CAPSULE | Freq: Every day | ORAL | 0 refills | Status: DC

## 2019-04-12 NOTE — Telephone Encounter (Signed)
Patient called on 04/11/19 wanting to know if the medication for her "pulse" received prior approval. Per Vevelyn Francois epic note from 03/12/19, patient must first try Atenolol, Bisoprolol,Carvedilol, Metoprolol before granting approval for Bystolic. Left message for patient to call back. Pls advise. Thank you.

## 2019-04-12 NOTE — Telephone Encounter (Signed)
Spoke with pt and she did not receive the handwritten prescriptions. I have sent in a 30 day, no refill, for Inderal LA 60mg  qd. Pt understands she may try this medication. If it does not work for her, she will call the office back to try another BB.

## 2019-04-12 NOTE — Telephone Encounter (Signed)
Patient called the office on 04/11/19 wanting to know if prior approval for "pulse" medication has been approved. Per Vevelyn Francois office note on 03/12/19, pt must first try Atenolol, Bisoprolol, Carvedilol, or Metoprolol before granting approval for Bystolic. (Bystolic 2.5 mg was sent on 03/12/19.)  Please advise on which medication that the patient will be switched to, along with dosage.

## 2019-04-12 NOTE — Addendum Note (Signed)
Addended by: Oretha Milch on: 04/12/2019 11:06 AM   Modules accepted: Orders

## 2019-04-17 ENCOUNTER — Encounter: Payer: Self-pay | Admitting: Cardiology

## 2019-04-17 NOTE — Progress Notes (Signed)
Remote pacemaker transmission.   

## 2019-05-31 ENCOUNTER — Other Ambulatory Visit: Payer: Self-pay | Admitting: Internal Medicine

## 2019-07-04 ENCOUNTER — Emergency Department (HOSPITAL_BASED_OUTPATIENT_CLINIC_OR_DEPARTMENT_OTHER)
Admission: EM | Admit: 2019-07-04 | Discharge: 2019-07-04 | Disposition: A | Discharge status: Home / Self Care | Payer: Medicaid Other | Attending: Emergency Medicine | Admitting: Emergency Medicine

## 2019-07-04 ENCOUNTER — Emergency Department (HOSPITAL_BASED_OUTPATIENT_CLINIC_OR_DEPARTMENT_OTHER): Payer: Medicaid Other

## 2019-07-04 ENCOUNTER — Encounter (HOSPITAL_BASED_OUTPATIENT_CLINIC_OR_DEPARTMENT_OTHER): Payer: Self-pay | Admitting: Emergency Medicine

## 2019-07-04 ENCOUNTER — Other Ambulatory Visit: Payer: Self-pay

## 2019-07-04 DIAGNOSIS — F17210 Nicotine dependence, cigarettes, uncomplicated: Secondary | ICD-10-CM | POA: Insufficient documentation

## 2019-07-04 DIAGNOSIS — M25471 Effusion, right ankle: Secondary | ICD-10-CM | POA: Diagnosis not present

## 2019-07-04 DIAGNOSIS — E119 Type 2 diabetes mellitus without complications: Secondary | ICD-10-CM | POA: Insufficient documentation

## 2019-07-04 DIAGNOSIS — Z7984 Long term (current) use of oral hypoglycemic drugs: Secondary | ICD-10-CM | POA: Diagnosis not present

## 2019-07-04 DIAGNOSIS — I10 Essential (primary) hypertension: Secondary | ICD-10-CM | POA: Insufficient documentation

## 2019-07-04 DIAGNOSIS — M25571 Pain in right ankle and joints of right foot: Secondary | ICD-10-CM | POA: Diagnosis present

## 2019-07-04 DIAGNOSIS — Z950 Presence of cardiac pacemaker: Secondary | ICD-10-CM | POA: Diagnosis not present

## 2019-07-04 DIAGNOSIS — Z79899 Other long term (current) drug therapy: Secondary | ICD-10-CM | POA: Insufficient documentation

## 2019-07-04 DIAGNOSIS — Z7982 Long term (current) use of aspirin: Secondary | ICD-10-CM | POA: Insufficient documentation

## 2019-07-04 HISTORY — DX: Alcohol abuse, uncomplicated: F10.10

## 2019-07-04 MED ORDER — ACETAMINOPHEN 500 MG PO TABS
1000.0000 mg | ORAL_TABLET | Freq: Once | ORAL | Status: AC
Start: 2019-07-04 — End: 2019-07-04
  Administered 2019-07-04: 1000 mg via ORAL
  Filled 2019-07-04: qty 2

## 2019-07-04 NOTE — ED Notes (Signed)
Pt given ice pack and instructed on use. Pt verbalized understanding.

## 2019-07-04 NOTE — ED Provider Notes (Signed)
[facility] EMERGENCY DEPARTMENT Provider Note   CSN: [id number] Arrival date & time: 07/04/19  0539     History   Chief Complaint Chief Complaint  Patient presents with  . Foot Pain    HPI Brittney Cooper is a 58 y.o. female.     The history is provided by the patient.  Foot Injury Location:  Foot Time since incident:  5 hours Injury: yes   Mechanism of injury: fall   Fall:    Fall occurred:  Walking   Impact surface:  Hard floor   Point of impact: foot.   Entrapped after fall: no   Foot location:  R foot Pain details:    Quality:  Aching   Radiates to:  Does not radiate   Severity:  Moderate   Onset quality:  Sudden   Duration:  5 hours   Progression:  Unchanged Chronicity:  New Dislocation: no   Foreign body present:  No foreign bodies Prior injury to area:  Yes Relieved by:  Nothing Worsened by:  Nothing Ineffective treatments:  NSAIDs Associated symptoms: no back pain, no fever, no neck pain, no numbness, no swelling and no tingling   Risk factors: no concern for non-accidental trauma     Past Medical History:  Diagnosis Date  . ADHD (attention deficit hyperactivity disorder)   . 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 Allergy   . Anxiety   . Arthritis   . Bipolar disorder (HCC)   . Cardiac arrest (HCC) 06-2015, 08-2016   x3- pt has pacemaker   . Chronic pain    back  . Depression   . Fatty liver   . Fracture of lateral malleolus of right fibula   . Hepatomegaly   . Hyperlipidemia   . Hypertension   . OCD (obsessive compulsive disorder)   . Pancreatic pseudocyst   . Panic attack   . 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 Syncope    neurocardiogenic  . Third degree heart block [facility](HCC)     Patient Active Problem List   Diagnosis Date Noted  . Type 2 diabetes mellitus with hyperglycemia, without long-term current use of insulin (HCC) 02/09/2017  . MDD (major  depressive disorder), recurrent severe, 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  . Complete heart block (HCC)   . Palpitation 10/11/2016  . Palpitations 10/11/2016  . Acute pancreatitis 09/15/2016  . Alcohol abuse 09/15/2016  . ADHD (attention deficit hyperactivity disorder) 09/15/2016  . Tobacco use 09/15/2016  . Anxiety 09/15/2016  . Hypokalemia 09/15/2016  . Pancreatitis 07/09/2016  . Alcoholic pancreatitis 07/08/2016  . Alcohol withdrawal (HCC) 07/08/2016  . Bipolar disorder (HCC) 07/08/2016  . Essential hypertension 07/08/2016  . Left shoulder pain 09/09/2014    Past Surgical History:  Procedure Laterality Date  . ABDOMINAL HYSTERECTOMY    . ANTERIOR AND POSTERIOR REPAIR    . BUNIONECTOMY Bilateral   . CESAREAN SECTION    . EP IMPLANTABLE DEVICE N/A 10/12/2016   Procedure: Pacemaker Implant;  Surgeon: Will Jorja LoaMartin Camnitz, MD;  Location: [facility] INVASIVE CV LAB;  Service: Cardiovascular;  Laterality: N/A;  . finfger surgery    . FINGER SURGERY    . LTCS    . MYRINGOTOMY    . ORIF ANKLE FRACTURE Right 07/25/2015   Procedure: OPEN REDUCTION INTERNAL FIXATION (ORIF) RIGHT ANKLE ;  Surgeon: Sheral Apleyimothy D Murphy, MD;  Location: [facility];  Service: Orthopedics;  Laterality: Right;  . PACEMAKER PLACEMENT    . RHINOPLASTY    . TONSILLECTOMY    . TUBAL LIGATION       OB History   No obstetric history on file.      Home Medications    Prior to Admission medications   Medication Sig Start Date End Date Taking? Authorizing Provider  acetaminophen (TYLENOL) 500 MG tablet Take 2 tablets (1,000 mg total) by mouth every 6 (six) hours as needed for mild pain. 02/09/17   Armandina StammerNwoko, Agnes I, NP  ALPRAZolam Prudy Feeler(XANAX) 0.5 MG tablet Take 0.25 mg by mouth 2 (two) times daily as needed for anxiety or sleep. 1./-1 tablet 2-3 times a day     [provider]  ARIPiprazole (ABILIFY) 10 MG tablet Take 1 tablet by mouth daily.    [provider]  aspirin EC 81 MG tablet Take 1  tablet (81 mg total) by mouth daily. For heart health 02/09/17   Armandina StammerNwoko, Agnes I, NP  glipiZIDE (GLUCOTROL XL) 10 MG 24 hr tablet Take 5 mg by mouth daily with breakfast.  05/25/17   [provider]  metFORMIN (GLUCOPHAGE) 500 MG tablet Take 1 tablet (500 mg total) by mouth 2 (two) times daily with a meal. For diabetes management 02/09/17   Armandina StammerNwoko, Agnes I, NP  midodrine (PROAMATINE) 5 MG tablet Take 1 tablet (5 mg total) by mouth every 4 (four) hours while awake. 03/09/19   Duke SalviaKlein, Steven C, MD  Multiple Vitamin (MULTIVITAMIN WITH MINERALS) TABS tablet Take 1 tablet by mouth daily. For low Vitamin replacement 02/09/17   Armandina StammerNwoko, Agnes I, NP  nebivolol (BYSTOLIC) 2.5 MG tablet Take 1 tablet (2.5 mg total) by mouth daily. 03/09/19   Duke SalviaKlein, Steven C, MD  propranolol ER (INDERAL LA) 60 MG 24 hr capsule TAKE ONE (1) CAPSULE EACH DAY BY MOUTH 05/31/19   Duke SalviaKlein, Steven C, MD  thiamine 100 MG tablet Take 1 tablet (100 mg total) by mouth daily. For low thiamine 02/09/17   Armandina StammerNwoko, Agnes I, NP  traZODone (DESYREL) 100 MG tablet Take 200 mg by mouth at bedtime.    [provider]  vitamin E (VITAMIN E) 400 UNIT capsule Take 400 Units by mouth daily.    [provider]    Family History Family History  Problem Relation Age of Onset  . Stroke Mother   . Kidney disease Mother   . Colon polyps Mother   . Lung cancer Father        stage IV   . Colon polyps Father   . Stomach cancer Maternal Grandfather   . Colon cancer Paternal Grandfather   . Rectal cancer Neg Hx   . Esophageal cancer Neg Hx   . Pancreatic cancer Neg Hx     Social History Social History   Tobacco Use  . Smoking status: Current Every Day Smoker    Packs/day: 1.00    Years: 15.00    Pack years: 15.00    Types: Cigarettes  . Smokeless tobacco: Never Used  Substance Use Topics  . Alcohol use: Yes    Alcohol/week: 0.0 standard drinks    Comment: drinks for couple of days, then no alcohol for days  . Drug use: No      Allergies   Codeine, Demerol [meperidine], and Metoprolol   Review of Systems Review of Systems  Constitutional: Negative for fever.  Eyes: Negative for visual disturbance.  Respiratory: Negative for shortness of breath.   Cardiovascular: Negative for chest pain.  Musculoskeletal: Positive for arthralgias. Negative for back pain and neck pain.  Neurological: Negative for dizziness, facial asymmetry, weakness, light-headedness, numbness and headaches.  All other systems reviewed and are negative.    Physical Exam Updated Vital Signs BP (!) 140/94 (BP Location: Right Arm)   Pulse 85   Temp 99 F (37.2 C) (Oral)   Resp 16   Ht 5\' 4"  (1.626 m)   Wt 77.1 kg   SpO2 97%   BMI 29.18 kg/m   Physical Exam Vitals signs and nursing note reviewed.  Constitutional:      General: She is not in acute distress.    Appearance: She is normal weight.  HENT:     Head: Normocephalic and atraumatic.     Nose: Nose normal.  Eyes:     Conjunctiva/sclera: Conjunctivae normal.     Pupils: Pupils are equal, round, and reactive to light.  Neck:     Musculoskeletal: Normal range of motion and neck supple.  Cardiovascular:     Rate and Rhythm: Normal rate and regular rhythm.     Pulses: Normal pulses.     Heart sounds: Normal heart sounds.  Pulmonary:     Effort: Pulmonary effort is normal.     Breath sounds: Normal breath sounds.  Abdominal:     General: Abdomen is flat. Bowel sounds are normal.     Tenderness: There is no abdominal tenderness.  Musculoskeletal: Normal range of motion.        General: No deformity.     Right knee: Normal.     Right ankle: Normal. Achilles tendon normal.     Right lower leg: Normal.     Right foot: Normal.  Skin:    General: Skin is warm and dry.     Capillary Refill: Capillary refill takes less than 2 seconds.  Neurological:     General: No focal deficit present.     Mental Status: She is alert and oriented to person, place, and time.   Psychiatric:        Mood and Affect: Mood normal.        Behavior: Behavior normal.      ED Treatments / Results  Labs (all labs ordered are listed, but only abnormal results are displayed) Labs Reviewed - No data to display  EKG None  Radiology Results for orders placed or performed in visit on 04/10/19  CUP PACEART REMOTE DEVICE CHECK  Result Value Ref Range   Pulse Generator Manufacturer BITR    Date Time Interrogation Session [id number]    Pulse Gen Model 585277 Eluna 8 DR-T ProMRI    Pulse Gen Serial Number [id number]    Clinic Name [facility]    Implantable Pulse Generator Type Implantable Pulse Generator    Implantable Pulse Generator Implant Date 14431540    Implantable Lead Manufacturer BITR    Implantable Lead Model 377 [address] [place] [address]    Implantable Lead Serial Number [id number]    Implantable Lead Implant Date 09326712    Implantable Lead Location Detail 1 UNKNOWN    Implantable Lead Location [id number]    Implantable Lead Manufacturer BITR    Implantable Lead Model 377 177 [place] S [id number]    Implantable Lead Serial Number [id number]    Implantable Lead Implant Date 38250539    Implantable Lead Location Detail 1 UNKNOWN    Implantable Lead Location [id number]    Dg Ankle Complete Right  Result Date: 07/04/2019 CLINICAL DATA:  Fall. Right foot and ankle pain.  EXAM: RIGHT ANKLE - COMPLETE 3+ VIEW COMPARISON:  Left ankle radiographs 07/15/2015. Left foot radiographs 12/30/2018 FINDINGS: ORIF of the distal fibula is noted. Previously noted fractures have healed. A left ankle joint effusion is present. There is no acute fracture. Small calcaneal spurs noted. IMPRESSION: 1. Joint effusion without acute osseous abnormality. Ligamentous injury is not excluded. Electronically Signed   By: Marin Robertshristopher  Mattern M.D.   On: 07/04/2019 06:30   Dg Foot Complete Right  Result Date: 07/04/2019 CLINICAL DATA:  Right foot and ankle pain after falling. EXAM: RIGHT FOOT COMPLETE - 3+ VIEW  COMPARISON:  Right foot radiographs 12/30/2018 FINDINGS: Previously noted fracture of the proximal phalanx in the great toe has healed. Postsurgical changes of the first metatarsal are noted. Distal fibular ORIF is present. Soft tissue swelling is present about the ankle. No acute abnormalities are present in the foot. IMPRESSION: 1. No acute abnormalities of the foot. 2. Healed fracture of the great toe. Electronically Signed   By: Marin Robertshristopher  Mattern M.D.   On: 07/04/2019 06:28    Procedures Procedures (including critical care time)  Medications Ordered in ED Medications  acetaminophen (TYLENOL) tablet 1,000 mg (has no administration in time range)     With nurse present patient stating she wanted to tape the encounter.  I informed her this was against [facility] policy.  I explained that she had an ankle effusion and if symptoms persisted to follow up with orthopedics.  Their information was provided.    Final Clinical Impressions(s) / ED Diagnoses   Return for intractable cough, coughing up blood,fevers >100.4 unrelieved by medication, shortness of breath, intractable vomiting, chest pain, shortness of breath, weakness,numbness, changes in speech, facial asymmetry,abdominal pain, passing out,Inability to tolerate liquids or food, cough, altered mental status or any concerns. No signs of systemic illness or infection. The patient is nontoxic-appearing on exam and vital signs are within normal limits.   I have reviewed the triage vital signs and the nursing notes. Pertinent labs &imaging results that were available during my care of the patient were reviewed by me and considered in my medical decision making (see chart for details).  After history, exam, and medical workup I feel the patient has been appropriately medically screened and is safe for discharge home. Pertinent diagnoses were discussed with the patient. Patient was given return precautions    Abbigale Mcelhaney, MD 07/04/19  2336

## 2019-07-04 NOTE — ED Triage Notes (Signed)
Pt reports right foot pain after falling at 0130. Pt states she took 2 aleve prior to coming.

## 2019-07-10 ENCOUNTER — Ambulatory Visit (INDEPENDENT_AMBULATORY_CARE_PROVIDER_SITE_OTHER): Payer: Medicaid Other | Admitting: *Deleted

## 2019-07-10 DIAGNOSIS — I442 Atrioventricular block, complete: Secondary | ICD-10-CM

## 2019-07-10 DIAGNOSIS — R002 Palpitations: Secondary | ICD-10-CM | POA: Diagnosis not present

## 2019-07-11 LAB — CUP PACEART REMOTE DEVICE CHECK
Date Time Interrogation Session: 20200715182144
Implantable Lead Implant Date: 20171017
Implantable Lead Implant Date: 20171017
Implantable Lead Location: 753859
Implantable Lead Location: 753860
Implantable Lead Model: 377
Implantable Lead Model: 377
Implantable Lead Serial Number: 49593512
Implantable Lead Serial Number: 49620910
Implantable Pulse Generator Implant Date: 20171017
Pulse Gen Model: 394969
Pulse Gen Serial Number: 68784192

## 2019-07-24 NOTE — Progress Notes (Signed)
Remote pacemaker transmission.   

## 2019-09-18 ENCOUNTER — Other Ambulatory Visit: Payer: Self-pay

## 2019-09-18 ENCOUNTER — Encounter: Payer: Self-pay | Admitting: Internal Medicine

## 2019-09-18 ENCOUNTER — Ambulatory Visit: Payer: Medicaid Other | Admitting: Internal Medicine

## 2019-09-18 VITALS — BP 142/100 | HR 94 | Ht 64.0 in | Wt 173.2 lb

## 2019-09-18 DIAGNOSIS — Z950 Presence of cardiac pacemaker: Secondary | ICD-10-CM | POA: Diagnosis not present

## 2019-09-18 DIAGNOSIS — I272 Pulmonary hypertension, unspecified: Secondary | ICD-10-CM

## 2019-09-18 DIAGNOSIS — R55 Syncope and collapse: Secondary | ICD-10-CM | POA: Diagnosis not present

## 2019-09-18 DIAGNOSIS — I442 Atrioventricular block, complete: Secondary | ICD-10-CM | POA: Diagnosis not present

## 2019-09-18 DIAGNOSIS — I10 Essential (primary) hypertension: Secondary | ICD-10-CM

## 2019-09-18 NOTE — Patient Instructions (Addendum)
Medication Instructions:  The current medical regimen is effective;  continue present plan and medications.  If you need a refill on your cardiac medications before your next appointment, please call your pharmacy.   Lab work: 24 hour urine collection for metanephrines.  Please pick up the container for this on the 1st floor at [facility].    If you have labs (blood work) drawn today and your tests are completely normal, you will receive your results only by: Marland Kitchen MyChart Message (if you have MyChart) OR . A paper copy in the mail If you have any lab test that is abnormal or we need to change your treatment, we will call you to review the results.  Testing/Procedures: Your physician has recommended that you wear a holter monitor for 14 days. Holter monitors are medical devices that record the heart's electrical activity. Doctors most often use these monitors to diagnose arrhythmias. Arrhythmias are problems with the speed or rhythm of the heartbeat. The monitor is a small, portable device. You can wear one while you do your normal daily activities. This is usually used to diagnose what is causing palpitations/syncope (passing out).  Follow-Up: . Follow up in 8 weeks with Dr Caryl Comes by virtual visit.  Thank you for choosing [facility]!!

## 2019-09-18 NOTE — Progress Notes (Signed)
Patient Care Team: Cheron Schaumann., MD as PCP - General (Internal Medicine)   HPI  Brittney Cooper is a 58 y.o. female Seen in followup for dysautonomic syncope.She had recurrent syncope with a monitor on.This was associated with a 6 second pause 2 . She had head trauma requiring laceration. She underwent pacing with a Biotronik CLS and is now programmed on very responsive  Other contributing complex medical comorbidities outlined in the old notes. Alcoholic induced pancreatitis is noted in the old chart 9/17; suicidal ideation also reported hospitalized 2/18  Some hypertension   She has had a couple falls.  She hit her head.  Looking for a second opinion she went to see [name] over at [place].  He stopped some of her medications that he thought might be contributing to her dizziness.  An echocardiogram had been read as moderate pulmonary hypertension and she was referred for right heart catheterization.  A repeat echo was read as mild.  She continues to have recurrent syncope.  She says that there is no warning.  Is accompanied by profound residual fatigue and orthostatic intolerance.  Her smart watch tells her she has heart rates in the 160s.  It is not clear that they are associated.  She has injured herself on multiple occasions with falls.  Denies olfactory or visual hallucinations prior to syncope.     Records and Results Reviewed   Past Medical History:  Diagnosis Date  . ADHD (attention deficit hyperactivity disorder)   . Alcohol abuse   . 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 Allergy   . Anxiety   . Arthritis   . Bipolar disorder (HCC)   . Cardiac arrest (HCC) 06-2015, 08-2016   x3- pt has pacemaker   . Chronic pain    back  . Depression   . Fatty liver   . Fracture of lateral malleolus of right fibula   . Hepatomegaly   . Hyperlipidemia   . Hypertension   . OCD (obsessive compulsive disorder)   . Pancreatic pseudocyst   . Panic attack    . 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 Syncope    neurocardiogenic  . Third degree heart block [facility])     Past Surgical History:  Procedure Laterality Date  . ABDOMINAL HYSTERECTOMY    . ANTERIOR AND POSTERIOR REPAIR    . BUNIONECTOMY Bilateral   . CESAREAN SECTION    . EP IMPLANTABLE DEVICE N/A 10/12/2016   Procedure: Pacemaker Implant;  Surgeon: Will Jorja Loa, MD;  [facility] LAB;  Service: Cardiovascular;  Laterality: N/A;  . finfger surgery    . FINGER SURGERY    . LTCS    . MYRINGOTOMY    . ORIF ANKLE FRACTURE Right 07/25/2015   Procedure: OPEN REDUCTION INTERNAL FIXATION (ORIF) RIGHT ANKLE ;  Surgeon: Sheral Apley, MD;  Location: [facility];  Service: Orthopedics;  Laterality: Right;  . PACEMAKER PLACEMENT    . RHINOPLASTY    . TONSILLECTOMY    . TUBAL LIGATION      Current Outpatient Medications  Medication Sig Dispense Refill  . acetaminophen (TYLENOL) 500 MG tablet Take 2 tablets (1,000 mg total) by mouth every 6 (six) hours as needed for mild pain. 30 tablet 0  . ALPRAZolam (XANAX) 0.5 MG tablet Take 0.25 mg by mouth 2 (two) times daily as needed for  anxiety or sleep. 1./-1 tablet 2-3 times a day     . ARIPiprazole (ABILIFY) 10 MG tablet Take 1 tablet by mouth daily.    Marland Kitchen aspirin EC 81 MG tablet Take 1 tablet (81 mg total) by mouth daily. For heart health    . fenofibrate (TRICOR) 145 MG tablet TAKE ONE (1) TABLET BY MOUTH EVERY DAY    . gabapentin (NEURONTIN) 300 MG capsule TAKE ONE CAPSULE BY MOUTH EVERY NIGHT AT BEDTIME    . glipiZIDE (GLUCOTROL XL) 10 MG 24 hr tablet Take 5 mg by mouth daily with breakfast.     . meloxicam (MOBIC) 7.5 MG tablet TAKE 1 TABLET BY MOUTH DAILY    . metFORMIN (GLUCOPHAGE) 500 MG tablet Take 1 tablet (500 mg total) by mouth 2 (two) times daily with a meal. For diabetes management 60 tablet 0  . midodrine (PROAMATINE) 5 MG tablet  Take 1 tablet (5 mg total) by mouth every 4 (four) hours while awake. 420 tablet 3  . Multiple Vitamin (MULTIVITAMIN WITH MINERALS) TABS tablet Take 1 tablet by mouth daily. For low Vitamin replacement 1 tablet 0  . propranolol ER (INDERAL LA) 60 MG 24 hr capsule TAKE ONE (1) CAPSULE EACH DAY BY MOUTH 90 capsule 2  . thiamine 100 MG tablet Take 1 tablet (100 mg total) by mouth daily. For low thiamine 1 tablet 0  . traZODone (DESYREL) 100 MG tablet Take 200 mg by mouth at bedtime.    . vitamin E (VITAMIN E) 400 UNIT capsule Take 400 Units by mouth daily.     Current Facility-Administered Medications  Medication Dose Route Frequency Provider Last Rate Last Dose  . 0.9 %  sodium chloride infusion  500 mL Intravenous Continuous Nandigam, Venia Minks, MD        Allergies  Allergen Reactions  . Codeine Nausea And Vomiting  . Demerol [Meperidine] Other (See Comments)    Hallucinations   . Metoprolol Other (See Comments)    nightmares      Review of Systems negative except from HPI and PMH  Physical Exam BP (!) 142/100   Pulse 94   Ht 5\' 4"  (1.626 m)   Wt 173 lb 3.2 oz (78.6 kg)   SpO2 97%   BMI 29.73 kg/m  Well developed and well nourished in no acute distress HENT normal Neck supple with JVP-flat Clear Device pocket well healed; without hematoma or erythema.  There is no tethering  Regular rate and rhythm, no  murmur Abd-soft with active BS No Clubbing cyanosis   edema Skin-warm and dry A & Oriented  Grossly normal sensory and motor function  ECG    Assessment and  Plan  Syncope neurally mediated/Dysautonomia  Complete Heart Block intermittent  Pulm hypertension   Pacemaker  -- Biotronik The patient's device was interrogated.  The information was reviewed. No changes were made in the programming.     Hypertension  Orthostatic hypertension  She continues to have syncope--she says the episodes are more abrupt than they were previously.  This makes me wonder whether  the diagnosis is right or not.  Knowing that there was intermittent heart block strongly supports autonomic insufficiency.  Orthostatic vital signs today are notable for orthostatic hypertension raising the possibility of a hyperadrenergic state.  We will check her catecholamines.  I remain perplexed.  We will undertake a monitor to see if we can see a triggering arrhythmia for her syncope.  She has an appointment to see somebody else in [place]  for another opinion.  I encouraged her in this.  I would hate to be missing something on her.  Following that evaluation, we will regroup by telehealth and at that point depending on whether she would like to follow-up here or neurology, would consider the use of a beta-blocker given repeated orthostatic hypertension  We spent more than 50% of our >25 min visit in face to face counseling regarding the above

## 2019-09-19 LAB — CUP PACEART INCLINIC DEVICE CHECK
Battery Remaining Longevity: 89 mo
Brady Statistic RA Percent Paced: 54 %
Brady Statistic RV Percent Paced: 1 %
Date Time Interrogation Session: 20200922193400
Implantable Lead Implant Date: 20171017
Implantable Lead Implant Date: 20171017
Implantable Lead Location: 753859
Implantable Lead Location: 753860
Implantable Lead Model: 377
Implantable Lead Model: 377
Implantable Lead Serial Number: 49593512
Implantable Lead Serial Number: 49620910
Implantable Pulse Generator Implant Date: 20171017
Lead Channel Impedance Value: 546 Ohm
Lead Channel Impedance Value: 585 Ohm
Lead Channel Pacing Threshold Amplitude: 0.8 V
Lead Channel Pacing Threshold Amplitude: 1 V
Lead Channel Pacing Threshold Pulse Width: 0.4 ms
Lead Channel Pacing Threshold Pulse Width: 0.4 ms
Lead Channel Sensing Intrinsic Amplitude: 2.4 mV
Lead Channel Sensing Intrinsic Amplitude: 8.5 mV
Lead Channel Setting Pacing Amplitude: 2 V
Lead Channel Setting Pacing Amplitude: 2.8 V
Lead Channel Setting Pacing Pulse Width: 0.4 ms
Pulse Gen Model: 394969
Pulse Gen Serial Number: 68784192

## 2019-10-10 ENCOUNTER — Ambulatory Visit (INDEPENDENT_AMBULATORY_CARE_PROVIDER_SITE_OTHER): Payer: Medicaid Other | Admitting: *Deleted

## 2019-10-10 DIAGNOSIS — I442 Atrioventricular block, complete: Secondary | ICD-10-CM

## 2019-10-10 LAB — CUP PACEART REMOTE DEVICE CHECK
Date Time Interrogation Session: 20201014204923
Implantable Lead Implant Date: 20171017
Implantable Lead Implant Date: 20171017
Implantable Lead Location: 753859
Implantable Lead Location: 753860
Implantable Lead Model: 377
Implantable Lead Model: 377
Implantable Lead Serial Number: 49593512
Implantable Lead Serial Number: 49620910
Implantable Pulse Generator Implant Date: 20171017
Pulse Gen Model: 394969
Pulse Gen Serial Number: 68784192

## 2019-10-22 NOTE — Progress Notes (Signed)
Remote pacemaker transmission.   

## 2019-11-18 NOTE — Progress Notes (Signed)
\     Electrophysiology TeleHealth Note   Due to national recommendations of social distancing due to [place] 19, an audio/video telehealth visit is felt to be most appropriate for this patient at this time.  See MyChart message from today for the patient's consent to telehealth for [facility].   Date:  11/19/2019   ID:  Brittney Cooper, DOB 02/13/1961, MRN [id number]  Location: patient's home  Provider location: [address] [place]  Evaluation Performed: Follow-up visit  PCP:  Loraine Leriche., MD    Electrophysiologist:  SK   Chief Complaint:     History of Present Illness:    Brittney Cooper is a 58 y.o. female who presents via audio/video conferencing for a telehealth visit today.  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  Moderate pulmonary hypertension referred for right heart catheterization repeat echo was read as mild. Continues with recurrent syncope without warning and profound residual fatigue.   She sought a third opinion in [place].  This note was reviewed.  He was dismissive of the issue of possible atrial tachycardia >>as trigger; also suggested changing her antidepressants which she reviewed with her psychiatrist.  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    Past Medical History:  Diagnosis Date  . ADHD (attention deficit hyperactivity disorder)   . Alcohol abuse   . Alcoholic pancreatitis 95/28, 11/16, 7/17  . Allergy   .  Anxiety   . Arthritis   . Bipolar disorder ([place])   . Cardiac arrest ([place]) 06-2015, 08-2016   x3- pt has pacemaker   . Chronic pain    back  . Depression   . Fatty liver   . Fracture of lateral malleolus of right fibula   . Hepatomegaly   . Hyperlipidemia   . Hypertension   . OCD (obsessive compulsive disorder)   . Pancreatic pseudocyst   . Panic attack   . 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 Syncope    neurocardiogenic  . Third degree heart block [facility])     Past Surgical History:  Procedure Laterality Date  . ABDOMINAL HYSTERECTOMY    . ANTERIOR AND POSTERIOR REPAIR    . BUNIONECTOMY Bilateral   . CESAREAN SECTION    . EP IMPLANTABLE DEVICE N/A 10/12/2016   Procedure: Pacemaker Implant;  Surgeon: Will Meredith Leeds, MD;  Location: [place] [facility];  Service: Cardiovascular;  Laterality: N/A;  . finfger surgery    . FINGER SURGERY    . LTCS    . MYRINGOTOMY    . ORIF ANKLE FRACTURE Right 07/25/2015   Procedure: OPEN REDUCTION INTERNAL FIXATION (ORIF) RIGHT ANKLE ;  Surgeon: Renette Butters, MD;  Location: [place];  Service: Orthopedics;  Laterality: Right;  . PACEMAKER PLACEMENT    . RHINOPLASTY    . TONSILLECTOMY    .  TUBAL LIGATION      Current Outpatient Medications  Medication Sig Dispense Refill  . ALPRAZolam (XANAX) 0.5 MG tablet Take 0.25 mg by mouth 2 (two) times daily as needed for anxiety or sleep. 1./-1 tablet 2-3 times a day     . amphetamine-dextroamphetamine (ADDERALL) 30 MG tablet Take 30-60 mg by mouth daily.    . ARIPiprazole (ABILIFY) 15 MG tablet Take 15 mg by mouth every morning.    Marland Kitchen aspirin EC 81 MG tablet Take 1 tablet (81 mg total) by mouth daily. For heart health    . fenofibrate (TRICOR) 145 MG tablet TAKE ONE (1) TABLET BY MOUTH EVERY DAY    . gabapentin (NEURONTIN) 300 MG capsule Take 1 capsule by mouth at bedtime.    Marland Kitchen glipiZIDE (GLUCOTROL XL) 10 MG  24 hr tablet Take 5 mg by mouth daily with breakfast.     . metFORMIN (GLUCOPHAGE) 500 MG tablet Take 1 tablet (500 mg total) by mouth 2 (two) times daily with a meal. For diabetes management 60 tablet 0  . midodrine (PROAMATINE) 5 MG tablet Take 1 tablet (5 mg total) by mouth every 4 (four) hours while awake. 420 tablet 3  . thiamine 100 MG tablet Take 1 tablet (100 mg total) by mouth daily. For low thiamine 1 tablet 0   Current Facility-Administered Medications  Medication Dose Route Frequency Provider Last Rate Last Dose  . 0.9 %  sodium chloride infusion  500 mL Intravenous Continuous Nandigam, Eleonore Chiquito, MD        Allergies:   Codeine, Demerol [meperidine], and Metoprolol   Social History:  The patient  reports that she has been smoking cigarettes. She has a 15.00 pack-year smoking history. She has never used smokeless tobacco. She reports current alcohol use. She reports that she does not use drugs.   Family History:  The patient's   family history includes Colon cancer in her paternal grandfather; Colon polyps in her father and mother; Kidney disease in her mother; Lung cancer in her father; Stomach cancer in her maternal grandfather; Stroke in her mother.   ROS:  Please see the history of present illness.   All other systems are personally reviewed and negative.    Exam:    Vital Signs:  BP 124/84   Pulse (!) 109   Ht 5\' 4"  (1.626 m)   Wt 165 lb (74.8 kg)   BMI 28.32 kg/m     Labs/Other Tests and Data Reviewed:    Recent Labs: No results found for requested labs within last 8760 hours.   Wt Readings from Last 3 Encounters:  11/19/19 165 lb (74.8 kg)  09/18/19 173 lb 3.2 oz (78.6 kg)  07/04/19 170 lb (77.1 kg)     Other studies personally reviewed: Additional studies/ records that were reviewed today include As above     ASSESSMENT & PLAN:    Syncope neurally mediated/Dysautonomia  Complete Heart Block intermittent  Pulm hypertension   Pacemaker  --  Biotronik The patient's device was interrogated.  The information was reviewed. No changes were made in the programming.     Hypertension  Orthostatic hypertension  Never received event recorder-- will order  Quite frustrated with ongoing struggles and that she lost her job  Discussed the psychosocial stressors with the care of her father--stage 4 CA Struggling  Gave her copy of [place]'s book  Will review in 6 w   We spent more than 50% of our >25 min visit in face to  face counseling regarding the above     COVID 19 screen The patient denies symptoms of COVID 19 at this time.  The importance of social distancing was discussed today.  Follow-up:  6w [facility]    Current medicines are reviewed at length with the patient today.   The patient  concerns regarding her medicines.  The following changes were made today:  none  Labs/ tests ordered today include:   No orders of the defined types were placed in this encounter.   Future tests ( post COVID )   s  Patient Risk:  after full review of this patients clinical status, I feel that they are at moderate  risk at this time.  Today, I have spent 18  minutes with the patient with telehealth technology discussing the above.  Signed, Sherryl MangesSteven Mychal Decarlo, MD  11/19/2019 5:46 PM     [facility] [address] [place] [address] [phone] (office) [phone] (fax)  .tef

## 2019-11-19 ENCOUNTER — Telehealth (INDEPENDENT_AMBULATORY_CARE_PROVIDER_SITE_OTHER): Payer: Medicaid Other | Admitting: Internal Medicine

## 2019-11-19 ENCOUNTER — Encounter: Payer: Self-pay | Admitting: Internal Medicine

## 2019-11-19 ENCOUNTER — Other Ambulatory Visit: Payer: Self-pay

## 2019-11-19 VITALS — BP 124/84 | HR 109 | Ht 64.0 in | Wt 165.0 lb

## 2019-11-19 DIAGNOSIS — R55 Syncope and collapse: Secondary | ICD-10-CM

## 2019-11-19 DIAGNOSIS — R002 Palpitations: Secondary | ICD-10-CM

## 2019-11-19 DIAGNOSIS — I442 Atrioventricular block, complete: Secondary | ICD-10-CM | POA: Diagnosis not present

## 2019-11-19 NOTE — Patient Instructions (Signed)
Medication Instructions:   *If you need a refill on your cardiac medications before your next appointment, please call your pharmacy*  Lab Work:  If you have labs (blood work) drawn today and your tests are completely normal, you will receive your results only by: Marland Kitchen MyChart Message (if you have MyChart) OR . A paper copy in the mail If you have any lab test that is abnormal or we need to change your treatment, we will call you to review the results.  Testing/Procedures: Even monitor   Follow-Up: At [facility], you and your health needs are our priority.  As part of our continuing mission to provide you with exceptional heart care, we have created designated Provider Care Teams.  These Care Teams include your primary Cardiologist (physician) and Advanced Practice Providers (APPs -  Physician Assistants and Nurse Practitioners) who all work together to provide you with the care you need, when you need it.  Your next appointment:   8 week(s)  The format for your next appointment:   Virtual Visit   Provider:   Virl Axe, MD  Other Instructions

## 2019-11-20 ENCOUNTER — Telehealth: Payer: Self-pay | Admitting: *Deleted

## 2019-11-20 NOTE — Telephone Encounter (Signed)
Dr. Caryl Comes has a book that he has asked for [place] to leave downstairs in our main lobby for the pt. I have a left a message for the pt that the book will be left downstairs in our main lobby.

## 2019-11-30 ENCOUNTER — Encounter: Payer: Self-pay | Admitting: *Deleted

## 2019-11-30 NOTE — Progress Notes (Signed)
Patient ID: Brittney Cooper, female   DOB: Nov 08, 1961, 58 y.o.   MRN: [id number] [employer] to ship a 30 day cardiac event monitor to the patients home.

## 2019-12-08 ENCOUNTER — Encounter: Payer: Self-pay | Admitting: Internal Medicine

## 2019-12-08 ENCOUNTER — Ambulatory Visit (INDEPENDENT_AMBULATORY_CARE_PROVIDER_SITE_OTHER): Payer: Medicaid Other

## 2019-12-08 DIAGNOSIS — R002 Palpitations: Secondary | ICD-10-CM | POA: Diagnosis not present

## 2019-12-08 DIAGNOSIS — R55 Syncope and collapse: Secondary | ICD-10-CM

## 2019-12-08 DIAGNOSIS — I442 Atrioventricular block, complete: Secondary | ICD-10-CM | POA: Diagnosis not present

## 2019-12-11 ENCOUNTER — Emergency Department (HOSPITAL_COMMUNITY)
Admission: EM | Admit: 2019-12-11 | Discharge: 2019-12-12 | Disposition: A | Discharge status: Home / Self Care | Payer: Medicaid Other | Attending: Emergency Medicine | Admitting: Emergency Medicine

## 2019-12-11 ENCOUNTER — Other Ambulatory Visit: Payer: Self-pay

## 2019-12-11 ENCOUNTER — Telehealth: Payer: Self-pay | Admitting: Internal Medicine

## 2019-12-11 DIAGNOSIS — F17210 Nicotine dependence, cigarettes, uncomplicated: Secondary | ICD-10-CM | POA: Diagnosis not present

## 2019-12-11 DIAGNOSIS — Z7982 Long term (current) use of aspirin: Secondary | ICD-10-CM | POA: Diagnosis not present

## 2019-12-11 DIAGNOSIS — I10 Essential (primary) hypertension: Secondary | ICD-10-CM | POA: Insufficient documentation

## 2019-12-11 DIAGNOSIS — Z79899 Other long term (current) drug therapy: Secondary | ICD-10-CM | POA: Diagnosis not present

## 2019-12-11 DIAGNOSIS — R55 Syncope and collapse: Secondary | ICD-10-CM | POA: Diagnosis not present

## 2019-12-11 DIAGNOSIS — E119 Type 2 diabetes mellitus without complications: Secondary | ICD-10-CM | POA: Diagnosis not present

## 2019-12-11 NOTE — ED Triage Notes (Signed)
Reports from home as directed holter monitor company, had a syncopal episode today (often occurrence for this pt) and shortly after received a call without additional details to present to ED.  Has a pacemaker as well. H/o third degree HB, atrial paced, NSR for EMS. [employer] monitor

## 2019-12-11 NOTE — ED Triage Notes (Signed)
Mididrine 5mg  every 4hr while awake and cymbalta only new meds

## 2019-12-11 NOTE — ED Notes (Signed)
Spoke to [employer] rep Fatima Sanger), ETA 63min from now to interrogate device.

## 2019-12-11 NOTE — ED Notes (Signed)
Called Holter Monitor ([phone]), rep states that only event noted at time of syncope was sinus tach (103-110), will fax copy of report.

## 2019-12-11 NOTE — ED Notes (Signed)
[employer] technician at bedside evaluating patient's monitor .

## 2019-12-11 NOTE — ED Provider Notes (Signed)
[facility] Note   CSN: [id number] Arrival date & time: 12/11/19  2156     History Chief Complaint  Patient presents with  . Loss of Consciousness    Helmut MusterMaria C Tanguma is a 58 y.o. female.  Patient with PMH of 3rd degree heart block with pacer, suspected dysautonomia, neurocardiogenic syncope presents to the ED with with a chief complaint of syncope.  She states that she was working in the [place] tonight when she became lightheaded.  She states that she has had so many episodes of syncope that she has trained herself to get down to the ground quickly if she feels lightheaded.  She was able to get down on the ground during the episode tonight, but did ultimately pass out.  She states that she awoke on the ground.  She denies injuring herself.  She is currently wearing a holter monitor and pressed the button tonight.  She was contacted by the company and per protocol was sent to the ER.  Notes in Epic are reviewed, EKG was reviewed by Dr. Liane Comberarnicelli, cardiology fellow, who documents that she had a run of sinus tachycardia with max HR of 110.  She denies any other symptoms at this time.  She is noted to be quite hypertensive, but states this is normal for her because she takes midodrine to "keep her off the floor."  The history is provided by the patient. No language interpreter was used.       Past Medical History:  Diagnosis Date  . ADHD (attention deficit hyperactivity disorder)   . Alcohol abuse   . 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 Allergy   . Anxiety   . Arthritis   . Bipolar disorder ([facility])   . Cardiac arrest ([facility]) 06-2015, 08-2016   x3- pt has pacemaker   . Chronic pain    back  . Depression   . Fatty liver   . Fracture of lateral malleolus of right fibula   . Hepatomegaly   . Hyperlipidemia   . Hypertension   . OCD (obsessive compulsive disorder)   . Pancreatic pseudocyst   . Panic attack   . 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 Syncope    neurocardiogenic  . Third degree heart block [facility](HCC)     Patient Active Problem List   Diagnosis Date Noted  . Type 2 diabetes mellitus with hyperglycemia, without long-term current use of insulin ([facility]) 02/09/2017  . MDD (major depressive disorder), recurrent severe, 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  . Complete heart block ([facility])   . Palpitation 10/11/2016  . Palpitations 10/11/2016  . Acute pancreatitis 09/15/2016  . Alcohol abuse 09/15/2016  . ADHD (attention deficit hyperactivity disorder) 09/15/2016  . Tobacco use 09/15/2016  . Anxiety 09/15/2016  . Hypokalemia 09/15/2016  . Pancreatitis 07/09/2016  . Alcoholic pancreatitis 07/08/2016  . Alcohol withdrawal ([facility]) 07/08/2016  . Bipolar disorder ([facility]) 07/08/2016  . Essential hypertension 07/08/2016  . Left shoulder pain 09/09/2014    Past Surgical History:  Procedure Laterality Date  . ABDOMINAL HYSTERECTOMY    . ANTERIOR AND POSTERIOR REPAIR    . BUNIONECTOMY Bilateral   . CESAREAN SECTION    . EP IMPLANTABLE DEVICE N/A 10/12/2016   Procedure: Pacemaker Implant;  Surgeon: Will Jorja LoaMartin Camnitz, MD;  [facility];  Service: Cardiovascular;  Laterality: N/A;  . finfger surgery    .  FINGER SURGERY    . LTCS    . MYRINGOTOMY    . ORIF ANKLE FRACTURE Right 07/25/2015   Procedure: OPEN REDUCTION INTERNAL FIXATION (ORIF) RIGHT ANKLE ;  Surgeon: Sheral Apley, MD;  Location: [facility];  Service: Orthopedics;  Laterality: Right;  . PACEMAKER PLACEMENT    . RHINOPLASTY    . TONSILLECTOMY    . TUBAL LIGATION       OB History   No obstetric history on file.     Family History  Problem Relation Age of Onset  . Stroke Mother   . Kidney disease Mother   . Colon polyps Mother   . Lung cancer Father        stage IV   . Colon polyps Father   . Stomach cancer Maternal Grandfather   . Colon  cancer Paternal Grandfather   . Rectal cancer Neg Hx   . Esophageal cancer Neg Hx   . Pancreatic cancer Neg Hx     Social History   Tobacco Use  . Smoking status: Current Every Day Smoker    Packs/day: 1.00    Years: 15.00    Pack years: 15.00    Types: Cigarettes  . Smokeless tobacco: Never Used  Substance Use Topics  . Alcohol use: Yes    Alcohol/week: 0.0 standard drinks    Comment: drinks for couple of days, then no alcohol for days  . Drug use: No    Home Medications Prior to Admission medications   Medication Sig Start Date End Date Taking? Authorizing Provider  ALPRAZolam (XANAX) 0.5 MG tablet Take 0.25 mg by mouth 2 (two) times daily as needed for anxiety or sleep. 1./-1 tablet 2-3 times a day     [provider]  amphetamine-dextroamphetamine (ADDERALL) 30 MG tablet Take 30-60 mg by mouth daily.    [provider]  ARIPiprazole (ABILIFY) 15 MG tablet Take 15 mg by mouth every morning. 11/12/19   [provider]  aspirin EC 81 MG tablet Take 1 tablet (81 mg total) by mouth daily. For heart health 02/09/17   Armandina Stammer I, NP  fenofibrate (TRICOR) 145 MG tablet TAKE ONE (1) TABLET BY MOUTH EVERY DAY 03/14/19   [provider]  gabapentin (NEURONTIN) 300 MG capsule Take 1 capsule by mouth at bedtime. 09/24/19   [provider]  glipiZIDE (GLUCOTROL XL) 10 MG 24 hr tablet Take 5 mg by mouth daily with breakfast.  05/25/17   [provider]  metFORMIN (GLUCOPHAGE) 500 MG tablet Take 1 tablet (500 mg total) by mouth 2 (two) times daily with a meal. For diabetes management 02/09/17   Armandina Stammer I, NP  midodrine (PROAMATINE) 5 MG tablet Take 1 tablet (5 mg total) by mouth every 4 (four) hours while awake. 03/09/19   Duke Salvia, MD  thiamine 100 MG tablet Take 1 tablet (100 mg total) by mouth daily. For low thiamine 02/09/17   Armandina Stammer I, NP    Allergies    Codeine, Demerol [meperidine], and Metoprolol  Review of  Systems   Review of Systems  All other systems reviewed and are negative.   Physical Exam Updated Vital Signs BP (!) 145/109 (BP Location: Right Arm)   Pulse 77   Temp 98.1 F (36.7 C) (Oral)   Resp 18   Ht 5\' 4"  (1.626 m)   Wt 77.1 kg   SpO2 100%   BMI 29.18 kg/m   Physical Exam Vitals and nursing note  reviewed.  Constitutional:      General: She is not in acute distress.    Appearance: She is well-developed.  HENT:     Head: Normocephalic and atraumatic.  Eyes:     Conjunctiva/sclera: Conjunctivae normal.  Cardiovascular:     Rate and Rhythm: Normal rate and regular rhythm.     Heart sounds: No murmur.  Pulmonary:     Effort: Pulmonary effort is normal. No respiratory distress.     Breath sounds: Normal breath sounds.  Abdominal:     Palpations: Abdomen is soft.     Tenderness: There is no abdominal tenderness.  Musculoskeletal:        General: Normal range of motion.     Cervical back: Neck supple.  Skin:    General: Skin is warm and dry.  Neurological:     Mental Status: She is alert and oriented to person, place, and time.  Psychiatric:        Mood and Affect: Mood normal.        Behavior: Behavior normal.     ED Results / Procedures / Treatments   Labs (all labs ordered are listed, but only abnormal results are displayed) Labs Reviewed  CBC  BASIC METABOLIC PANEL    EKG EKG Interpretation  Date/Time:  Tuesday December 11 2019 21:59:31 EST Ventricular Rate:  79 PR Interval:    QRS Duration: 58 QT Interval:  475 QTC Calculation: 545 R Axis:   51 Text Interpretation: atrial paced rhythm Borderline repolarization abnormality Prolonged QT interval No significant change since last tracing Confirmed by Dorie Rank [phone]) on 12/11/2019 10:02:02 PM   Radiology No results found.  Procedures Procedures (including critical care time)  Medications Ordered in ED Medications - No data to display  ED Course  I have reviewed the triage vital signs  and the nursing notes.  Pertinent labs & imaging results that were available during my care of the patient were reviewed by me and considered in my medical decision making (see chart for details).    MDM Rules/Calculators/A&P                      Patient with syncopal episode tonight.  She has significant history of the same.  She has third-degree heart block, reports dysautonomia, and POTS variant.  She is currently on a Holter monitor.  She had an event tonight.  She was able to get to the ground prior to passing out.  She did not injure herself.  She was advised to report to the emergency department for evaluation.  Check basic labs, check orthostats, and interrogate pacemaker.  There is also a rep for the Holter monitor who is coming to interrogate the monitor here.  We will continue to monitor.  No other events/tachy/bradyarrythmias.  Will recommend f/u with cardiology.  Patient understands and agrees with the plan.  Patient seen by and discussed with Dr. Christy Gentles.   Final Clinical Impression(s) / ED Diagnoses Final diagnoses:  Syncope and collapse    Rx / DC Orders ED Discharge Orders    None       Montine Circle, PA-C 12/12/19 0056    Ripley Fraise, MD 12/12/19 0111

## 2019-12-11 NOTE — Telephone Encounter (Signed)
Cardiology Moonlighter Note  Returned page from [place] for critical ECG. Patient made a manual recording after feeling lightheaded and passed out. Rhythm at the time was sinus tachycardia. Max HR 110bpm. No other tachy or bradyarrhythmias recorded. Patient on her way to [facility] ED for evaluation. Will cc ordering cardiologist Dr Caryl Comes on this note.   Marcie Mowers, MD Cardiology Fellow, PGY-7

## 2019-12-11 NOTE — ED Notes (Signed)
PT was able to ambulate unassisted to the bathroom, staff was standby assist for safety.

## 2019-12-12 ENCOUNTER — Telehealth: Payer: Self-pay | Admitting: Internal Medicine

## 2019-12-12 LAB — BASIC METABOLIC PANEL
Anion gap: 12 (ref 5–15)
BUN: 12 mg/dL (ref 6–20)
CO2: 25 mmol/L (ref 22–32)
Calcium: 9.5 mg/dL (ref 8.9–10.3)
Chloride: 100 mmol/L (ref 98–111)
Creatinine, Ser: 0.67 mg/dL (ref 0.44–1.00)
GFR calc Af Amer: >60 mL/min (ref >60)
GFR calc non Af Amer: >60 mL/min (ref >60)
Glucose, Bld: 105 mg/dL — ABNORMAL HIGH (ref 70–99)
Potassium: 4.1 mmol/L (ref 3.5–5.1)
Sodium: 137 mmol/L (ref 135–145)

## 2019-12-12 LAB — CBC
HCT: 42.8 % (ref 36.0–46.0)
Hemoglobin: 14.5 g/dL (ref 12.0–15.0)
MCH: 32.3 pg (ref 26.0–34.0)
MCHC: 33.9 g/dL (ref 30.0–36.0)
MCV: 95.3 fL (ref 80.0–100.0)
Platelets: 274 10*3/uL (ref 150–400)
RBC: 4.49 MIL/uL (ref 3.87–5.11)
RDW: 12.7 % (ref 11.5–15.5)
WBC: 6.8 10*3/uL (ref 4.0–10.5)
nRBC: 0 % (ref 0.0–0.2)

## 2019-12-12 NOTE — Telephone Encounter (Signed)
Monitor tracing received.  Upon review of chart noticed pt has been contacted about another event by the moonlighter the night before and was told to go to ER.  Obtained tracings from syncopal episode as well.  Tracings are as follows:  12/15 at 3:22PM CST- Accelerated Idioventricular Rhythm, Sinus Tach with rates 121-135.  Pt denies any symptoms.  12/15 at 6:13PM CST- Sinus Tach with rates of 103-110.  PT was very lightheaded and passed out.  Pt went to [facility] for eval.    12/16 at 1:44AM CST- Sinus Tach, Ventricular Tach 7-10 beats with rates 112-136.  Pt states she was asleep and did not feel anything.    Spoke with Dr. Irish Lack, [place].  He said no changes and send to Dr. Caryl Comes for review.  Will have scanned into chart for Dr. Caryl Comes.

## 2019-12-13 NOTE — Telephone Encounter (Signed)
noted 

## 2019-12-14 NOTE — Telephone Encounter (Signed)
Spoke with pt who states she is waiting on further instruction after being seen in ED on 12/12/2019.  Pt states she spoke with triage nurse on the 12/16 as well and was advised no changes per Dr Irish Lack and information would be sent to Dr Caryl Comes for evaluation.   Pt advised information was sent to Dr Caryl Comes; however, he will be out of the office until 12/25/2019.  Pt asks that RN consult with another Dr d/t Dr Caryl Comes being out of office.  Spoke with Dr Acie Fredrickson (DOD) today who states no changes and pt should to go to ER for any syncopal episodes.  Pt advised to ED as well for any active CP or SOB.  Pt verbalizes understanding and agrees with plan.

## 2019-12-14 NOTE — Telephone Encounter (Signed)
Attempted call to pt.  Left voicemail message for pt to call back to speak with triage nurse.

## 2019-12-14 NOTE — Telephone Encounter (Signed)
Follow up ° ° ° ° °Please return call to patient °

## 2019-12-14 NOTE — Telephone Encounter (Signed)
Follow Up   Patient requesting phone call today to follow up on 12/16 telephone encounter

## 2019-12-15 ENCOUNTER — Telehealth: Payer: Self-pay | Admitting: Cardiology

## 2019-12-15 NOTE — Telephone Encounter (Signed)
Cardiology Moonlighter Note  Received page from [employer] regarding critical ECG. Patient has event monitor for syncope and palpitations. Patient lost consciousness at 6:06 EST, monitor revealed sinus rhythm at 90 bpm. She was advised to go to the ED by [employer] but declined. I also called the patient given her episode of syncope. She reports she was feeling well and then became light headed and tried to lower herself to the floor but had syncope prior to making it all the way to a seated position. She denies chest pain, shortness of breath or other symptoms. I advised her that her heart monitor did not show arrhythmia but it would be safest to present to the ED for further evaluation given her loss of consciousness. She declined given that she feels well currently and did not feel the need to go but will if anything changes.  Bryna Colander, MD

## 2019-12-17 ENCOUNTER — Telehealth: Payer: Self-pay

## 2019-12-17 ENCOUNTER — Telehealth: Payer: Self-pay | Admitting: Internal Medicine

## 2019-12-17 NOTE — Telephone Encounter (Signed)
Spoke with patient in regards to patient triggered event from her cardiac monitor on 12/19 at 5:06PM CST. She reports that she felt dizzy, then got light-headed and passed out. Report shows sinus rhythm, sinus tachycardia. Reviewed by DOD, Dr. Johnsie Cancel, no arrhythmias. Patient to continue wearing heart monitor.

## 2019-12-17 NOTE — Telephone Encounter (Signed)
Left message for patient to call back in regards to report received from event monitor.

## 2019-12-17 NOTE — Telephone Encounter (Signed)
Received a call from [employer] regarding a critical ECG. Patient has an event monitor for syncope and has had episodes of syncope over the past few days corresponding with sinus tachycardia. This afternoon, her monitor revealed a 7 second run of VT. The patient reports that she felt dizzy about 3 different times today but denied any lightheadedness or syncope. She is feeling well at this time. On review of the records, it appears that she has had a few episodes of NSVT that have not correlated with her symptoms. She understands to come to the ER if her symptoms return or she has recurrent syncope.   Alric Quan, MD

## 2019-12-18 NOTE — Telephone Encounter (Signed)
Strip received and signed by DOD. No changes at this time.

## 2019-12-20 ENCOUNTER — Encounter (HOSPITAL_COMMUNITY): Payer: Self-pay

## 2019-12-20 ENCOUNTER — Emergency Department (HOSPITAL_COMMUNITY)
Admission: EM | Admit: 2019-12-20 | Discharge: 2019-12-20 | Disposition: A | Discharge status: Home / Self Care | Payer: Medicaid Other | Attending: Emergency Medicine | Admitting: Emergency Medicine

## 2019-12-20 ENCOUNTER — Telehealth: Payer: Self-pay

## 2019-12-20 DIAGNOSIS — Z5321 Procedure and treatment not carried out due to patient leaving prior to being seen by health care provider: Secondary | ICD-10-CM | POA: Insufficient documentation

## 2019-12-20 DIAGNOSIS — R55 Syncope and collapse: Secondary | ICD-10-CM | POA: Insufficient documentation

## 2019-12-20 LAB — CBC
HCT: 41.6 % (ref 36.0–46.0)
Hemoglobin: 13.9 g/dL (ref 12.0–15.0)
MCH: 32.3 pg (ref 26.0–34.0)
MCHC: 33.4 g/dL (ref 30.0–36.0)
MCV: 96.5 fL (ref 80.0–100.0)
Platelets: 294 10*3/uL (ref 150–400)
RBC: 4.31 MIL/uL (ref 3.87–5.11)
RDW: 13.2 % (ref 11.5–15.5)
WBC: 7 10*3/uL (ref 4.0–10.5)
nRBC: 0 % (ref 0.0–0.2)

## 2019-12-20 LAB — I-STAT BETA HCG BLOOD, ED (MC, WL, AP ONLY): I-stat hCG, quantitative: <5 m[IU]/mL (ref <5)

## 2019-12-20 LAB — BASIC METABOLIC PANEL
Anion gap: 13 (ref 5–15)
BUN: 14 mg/dL (ref 6–20)
CO2: 24 mmol/L (ref 22–32)
Calcium: 9.3 mg/dL (ref 8.9–10.3)
Chloride: 103 mmol/L (ref 98–111)
Creatinine, Ser: 0.78 mg/dL (ref 0.44–1.00)
GFR calc Af Amer: >60 mL/min (ref >60)
GFR calc non Af Amer: >60 mL/min (ref >60)
Glucose, Bld: 107 mg/dL — ABNORMAL HIGH (ref 70–99)
Potassium: 3.4 mmol/L — ABNORMAL LOW (ref 3.5–5.1)
Sodium: 140 mmol/L (ref 135–145)

## 2019-12-20 MED ORDER — SODIUM CHLORIDE 0.9% FLUSH: 3.0000 mL | Freq: Once | INTRAVENOUS | Status: DC

## 2019-12-20 NOTE — Telephone Encounter (Addendum)
Received monitor alert from [employer] for Sinus tach with syncope.  Report states pt did also injure her wrist and right foot.  Encounter in pt's record shows an ER visit 12/20/2019 with a note that pt left.  Attempted to contact pt.  Left voicemail message for pt to contact [facility] triage.  Monitor report taken to Dr Marlou Porch, DOD who states syncopal episode was not heart related.

## 2019-12-20 NOTE — ED Notes (Signed)
Pt informed this tech they were leaving.

## 2019-12-20 NOTE — ED Triage Notes (Signed)
Pt comes via [facility] for two syncopal episode that happened about 6 minutes apart, pt has a biotronic pacemaker and wearing a Holter monitor

## 2019-12-24 ENCOUNTER — Telehealth: Payer: Self-pay

## 2019-12-24 NOTE — Telephone Encounter (Signed)
Reviewed strips with Dr. Burt Knack. Telemetry showed NSR. No new orders at this time.

## 2019-12-24 NOTE — Telephone Encounter (Signed)
Monitor report received from 12/25 at 7:43PM that the patient passed out again. She was at home when it happened. She experienced lightheadedness and dizziness. No new injury occurred. Reiterated to the patient to stay hydrated, eat salty snacks, and informed her she would be called if MD has further recommendations.  There is a note on the monitor that they spoke with MD on call but no Epic documentation.

## 2019-12-31 ENCOUNTER — Telehealth: Payer: Self-pay

## 2019-12-31 NOTE — Telephone Encounter (Signed)
I received a Critical Event Monitor from 12/29/19 @ 7:34 EST.  I called patient who said that she passed out at that time.  This has been another occurrence without arrhythmia.    She has become accustom to this so she lays on the floor when feeling dizzy, passes out and recovers in 3-5 seconds.  I showed rhythm strip to Dr Anne Fu (DOD) for advisement and he stated "no arrhythmia".  Will forward to Dr Mabeline Caras for advisement.

## 2020-01-04 ENCOUNTER — Telehealth: Payer: Self-pay | Admitting: Internal Medicine

## 2020-01-04 NOTE — Telephone Encounter (Signed)
No new monitor reports have been received. There is a note in the appointment notes section for upcoming appointment indicating Dr Odessa Fleming scheduler [facility]) tried to reach her today.  Will forward to [facility]

## 2020-01-04 NOTE — Telephone Encounter (Signed)
Follow up appointment has been changed from February 2 to January 21

## 2020-01-04 NOTE — Telephone Encounter (Signed)
  Patient states she is returning a call but not sure from who. She states she is wearing a holter monitor and thinks it may be in regards to that.

## 2020-01-06 ENCOUNTER — Telehealth: Payer: Self-pay | Admitting: Internal Medicine

## 2020-01-06 NOTE — Telephone Encounter (Signed)
Cardiology Moonlighter Note  Returned page from [employer]. Patient had 1 event this evening. She had a 1 minute episode of sinus tachycardia (HR 140), followed by 7 beats of VT that spontaneously terminated. I personally called the patient to discuss. She states that she felt dizzy at the time and "laid her head down." Did not fall or pass out, but felt like she was going to. She feels very frustrated about this. These episodes are occurring at least once per week. She has an appointment with Dr Graciela Husbands next week to discuss further management. I discussed with the patient the prospect of coming to the ED for evaluation, however she states this is occurring so frequently that she cannot keep coming to the ED every time. She has been to the ED 3 times for similar episodes. She states she does not want to come to the ED tonight.   Dr Graciela Husbands cc'd on this note.   Rosario Jacks, MD Cardiology Fellow, PGY-7

## 2020-01-08 NOTE — Telephone Encounter (Signed)
Noted will see in the office

## 2020-01-09 ENCOUNTER — Ambulatory Visit (INDEPENDENT_AMBULATORY_CARE_PROVIDER_SITE_OTHER): Payer: Medicaid Other | Admitting: *Deleted

## 2020-01-09 DIAGNOSIS — I442 Atrioventricular block, complete: Secondary | ICD-10-CM

## 2020-01-09 LAB — CUP PACEART REMOTE DEVICE CHECK
Date Time Interrogation Session: 20210113173458
Implantable Lead Implant Date: 20171017
Implantable Lead Implant Date: 20171017
Implantable Lead Location: 753859
Implantable Lead Location: 753860
Implantable Lead Model: 377
Implantable Lead Model: 377
Implantable Lead Serial Number: 49593512
Implantable Lead Serial Number: 49620910
Implantable Pulse Generator Implant Date: 20171017
Pulse Gen Model: 394969
Pulse Gen Serial Number: 68784192

## 2020-01-13 DIAGNOSIS — Z950 Presence of cardiac pacemaker: Secondary | ICD-10-CM | POA: Insufficient documentation

## 2020-01-13 DIAGNOSIS — G901 Familial dysautonomia [Riley-Day]: Secondary | ICD-10-CM | POA: Insufficient documentation

## 2020-01-16 NOTE — Progress Notes (Signed)
Electrophysiology TeleHealth Note   Due to national recommendations of social distancing due to COVID 19, an audio/video telehealth visit is felt to be most appropriate for this patient at this time.  See MyChart message from today for the patient's consent to telehealth for [facility].   Date:  01/17/2020   ID:  Brittney Cooper, DOB 03/27/1961, MRN [id number]  Location: patient's home  Provider location: [address], [place]  Evaluation Performed: Follow-up visit  PCP:  Cheron Schaumann., MD  Cardiologist:     Electrophysiologist:  SK   Chief Complaint:  dizziness  History of Present Illness:    Brittney Cooper is a 59 y.o. female who presents via audio/video conferencing for a telehealth visit today.  Since last being seen in our clinic for dysautonomia with pacemaker-Biotronik DOI 2017 following monitors showing syncope and pauses, the patient reports  Ongoing episodes of recurrent dizziness and syncope--little warning with residual fatigue  Also has noted "dizzy" which are assoc with intense concentration-- dur 5-10 sec.  Has Rx ADD  She has sought other opinions, [name], [place] recently [facility]  MM did a tilt test ? 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   Past Medical History:  Diagnosis Date  . ADHD (attention deficit hyperactivity disorder)   . Alcohol abuse   . 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 Allergy   . Anxiety   . Arthritis   . Bipolar disorder (HCC)   . Cardiac arrest (HCC) 06-2015, 08-2016   x3- pt has pacemaker   . Chronic pain    back  . Depression   . Fatty liver   . Fracture of lateral malleolus of right fibula   . Hepatomegaly   . Hyperlipidemia   . Hypertension   . OCD (obsessive compulsive disorder)   . Pancreatic pseudocyst   . Panic attack   . 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 Syncope    neurocardiogenic  . Third degree heart block [facility])     Past Surgical History:  Procedure Laterality Date  . ABDOMINAL HYSTERECTOMY    . ANTERIOR AND POSTERIOR REPAIR    . BUNIONECTOMY Bilateral   . CESAREAN SECTION    . EP IMPLANTABLE DEVICE N/A 10/12/2016   Procedure: Pacemaker Implant;  Surgeon: Will Jorja Loa, MD;  [facility];  Service: Cardiovascular;  Laterality: N/A;  . finfger surgery    . FINGER SURGERY    . LTCS    . MYRINGOTOMY    . ORIF ANKLE FRACTURE Right 07/25/2015   Procedure: OPEN REDUCTION INTERNAL FIXATION (ORIF) RIGHT ANKLE ;  Surgeon: Sheral Apley, MD;  Location: [facility];  Service: Orthopedics;  Laterality: Right;  . PACEMAKER PLACEMENT    . RHINOPLASTY    . TONSILLECTOMY    . TUBAL LIGATION      Current Outpatient Medications  Medication Sig Dispense Refill  .  ALPRAZolam (XANAX) 0.5 MG tablet Take 0.25 mg by mouth 2 (two) times daily as needed for anxiety or sleep. 1./-1 tablet 2-3 times a day     . amphetamine-dextroamphetamine (ADDERALL) 30 MG tablet Take 30-60 mg by mouth daily.    . ARIPiprazole (ABILIFY) 15 MG tablet Take 15 mg by mouth daily.     Marland Kitchen aspirin EC 81 MG tablet Take 1 tablet (81 mg total) by mouth daily. For heart health    . divalproex (DEPAKOTE) 250 MG DR tablet Take 250 mg by mouth. Taking 3 at QHS    . DULoxetine (CYMBALTA) 30 MG capsule Take 30 mg by mouth daily. 2 caps at bedtime    . fenofibrate (TRICOR) 145 MG tablet Take 145 mg by mouth daily.     Marland Kitchen gabapentin (NEURONTIN) 300 MG capsule Take 300 mg by  mouth at bedtime.     Marland Kitchen glipiZIDE (GLUCOTROL XL) 10 MG 24 hr tablet Take 5 mg by mouth daily with breakfast.     . metFORMIN (GLUCOPHAGE) 500 MG tablet Take 1 tablet (500 mg total) by mouth 2 (two) times daily with a meal. For diabetes management 60 tablet 0  . omeprazole (PRILOSEC) 40 MG capsule Take 40 mg by mouth 2 (two) times daily.    . QUEtiapine (SEROQUEL) 100 MG tablet Take 100 mg by mouth at bedtime. 1-2 tablet s at bedtime    . rOPINIRole (REQUIP) 0.5 MG tablet Take 0.5 mg by mouth at bedtime.    . thiamine 100 MG tablet Take 1 tablet (100 mg total) by mouth daily. For low thiamine 1 tablet 0  . midodrine (PROAMATINE) 5 MG tablet Take 1 tablet (5 mg total) by mouth every 4 (four) hours while awake. (Patient not taking: Reported on 01/17/2020) 420 tablet 3   Current Facility-Administered Medications  Medication Dose Route Frequency Provider Last Rate Last Admin  . 0.9 %  sodium chloride infusion  500 mL Intravenous Continuous Nandigam, Eleonore Chiquito, MD        Allergies:   Codeine, Demerol [meperidine], and Metoprolol   Social History:  The patient  reports that she has been smoking cigarettes. She has a 15.00 pack-year smoking history. She has never used smokeless tobacco. She reports current alcohol use. She reports that she does not use drugs.   Family History:  The patient's   family history includes Colon cancer in her paternal grandfather; Colon polyps in her father and mother; Kidney disease in her mother; Lung cancer in her father; Stomach cancer in her maternal grandfather; Stroke in her mother.   ROS:  Please see the history of present illness.   All other systems are personally reviewed and negative.    Exam:    Vital Signs:  BP (!) 134/99   Pulse 99   Wt 168 lb (76.2 kg)   BMI 28.84 kg/m     Well appearing, alert and conversant, regular work of breathing,  good skin color Eyes- anicteric, neuro- grossly intact, skin- no apparent rash or lesions or cyanosis, mouth- oral  mucosa is pink   Labs/Other Tests and Data Reviewed:    Recent Labs: 12/20/2019: BUN 14; Creatinine, Ser 0.78; Hemoglobin 13.9; Platelets 294; Potassium 3.4; Sodium 140   Wt Readings from Last 3 Encounters:  01/17/20 168 lb (76.2 kg)  12/11/19 170 lb (77.1 kg)  11/19/19 165 lb (74.8 kg)     Other studies personally reviewed: Additional studies/ records that were reviewed today include:    Last device  remote is reviewed from [facility] PDF dated 1/21 which reveals normal device function,   arrhythmias - none    ASSESSMENT & PLAN:   Syncope neurally mediated/Dysautonomia  Complete Heart Block intermittent  Pulm hypertension   Pacemaker -- Biotronik The patient's device was interrogated. The information was reviewed. No changes were made in the programming.   Hypertension  Orthostatic hypertension  Tachypalpitations  She continues with syncope with inadequate warning-- this despite aggressive CLS;  Her complaints of palpitations and HR>>150s with activity suggests that her CLS might be too aggressive--we will reprogram her device.  Her orthostatic hypertension also has raised the spectre of hyperadrenergic states>> 24 hr urine for metanephrines  The Issue of pulm Htn was addressed last year, and the thought from our review was that there was none-- will repeat the echo and put it to rest        COVID 19 screen The patient denies symptoms of COVID 19 at this time.  The importance of social distancing was discussed today.  Follow-up: As Scheduled  Next remote: As Scheduled   Current medicines are reviewed at length with the patient today.   The patient does not have concerns regarding her medicines.  The following changes were made today:  none  Labs/ tests ordered today include:   24 hr urine collection for metanephrines  No orders of the defined types were placed in this encounter.   Future tests ( post COVID )    Patient Risk:  after full review of  this patients clinical status, I feel that they are at moderate risk at this time.  Today, I have spent 24* minutes with the patient with telehealth technology discussing the above. More than 50% of 40 min was spent in counseling related to the above   Signed, Virl Axe, MD  01/17/2020 5:35 PM     [place] [address] [phone] (office) [phone] (fax)

## 2020-01-17 ENCOUNTER — Telehealth (INDEPENDENT_AMBULATORY_CARE_PROVIDER_SITE_OTHER): Payer: Medicaid Other | Admitting: Internal Medicine

## 2020-01-17 ENCOUNTER — Other Ambulatory Visit: Payer: Self-pay

## 2020-01-17 VITALS — BP 134/99 | HR 99 | Wt 168.0 lb

## 2020-01-17 DIAGNOSIS — Z950 Presence of cardiac pacemaker: Secondary | ICD-10-CM

## 2020-01-17 DIAGNOSIS — G901 Familial dysautonomia [Riley-Day]: Secondary | ICD-10-CM | POA: Diagnosis not present

## 2020-01-17 DIAGNOSIS — R55 Syncope and collapse: Secondary | ICD-10-CM

## 2020-01-17 DIAGNOSIS — I442 Atrioventricular block, complete: Secondary | ICD-10-CM

## 2020-01-17 DIAGNOSIS — I272 Pulmonary hypertension, unspecified: Secondary | ICD-10-CM

## 2020-01-17 DIAGNOSIS — R002 Palpitations: Secondary | ICD-10-CM

## 2020-01-21 ENCOUNTER — Telehealth: Payer: Self-pay

## 2020-01-21 NOTE — Telephone Encounter (Signed)
Spoke with pt and advised per Dr Graciela Husbands and telehealth visit 01/17/2020 Dr Graciela Husbands would like for pt to have a 24 hour urine and an Echo.  Orders placed and pt verbalizes understanding and agrees with current plan.

## 2020-01-21 NOTE — Addendum Note (Signed)
Addended by: Alois Cliche on: 01/21/2020 02:48 PM   Modules accepted: Orders

## 2020-01-23 ENCOUNTER — Encounter: Payer: Self-pay | Admitting: Internal Medicine

## 2020-01-23 ENCOUNTER — Ambulatory Visit (HOSPITAL_COMMUNITY): Payer: Medicaid Other | Attending: Cardiovascular Disease

## 2020-01-23 ENCOUNTER — Other Ambulatory Visit: Payer: Self-pay

## 2020-01-23 ENCOUNTER — Ambulatory Visit (INDEPENDENT_AMBULATORY_CARE_PROVIDER_SITE_OTHER): Payer: Medicaid Other | Admitting: Internal Medicine

## 2020-01-23 VITALS — BP 136/88 | HR 90 | Ht 64.0 in | Wt 181.6 lb

## 2020-01-23 DIAGNOSIS — R002 Palpitations: Secondary | ICD-10-CM

## 2020-01-23 DIAGNOSIS — G901 Familial dysautonomia [Riley-Day]: Secondary | ICD-10-CM

## 2020-01-23 DIAGNOSIS — I442 Atrioventricular block, complete: Secondary | ICD-10-CM

## 2020-01-23 DIAGNOSIS — Z950 Presence of cardiac pacemaker: Secondary | ICD-10-CM | POA: Diagnosis not present

## 2020-01-23 DIAGNOSIS — I272 Pulmonary hypertension, unspecified: Secondary | ICD-10-CM | POA: Diagnosis not present

## 2020-01-23 MED ORDER — FLECAINIDE ACETATE 50 MG PO TABS: 50.0000 mg | ORAL_TABLET | Freq: Two times a day (BID) | ORAL | 3 refills | Status: DC

## 2020-01-23 NOTE — Progress Notes (Signed)
Patient Care Team: Cheron Schaumann., MD as PCP - General (Internal Medicine)   HPI  Brittney Cooper is a 59 y.o. female Seen in followup for dysautonomic syncope.She had recurrent syncope with a monitor on.This was associated with a 6 second pause 2 . She had head trauma requiring laceration. She underwent pacing with a Biotronik CLS and is now programmed on very responsive  Other contributing complex medical comorbidities outlined in the old notes. Alcoholic induced pancreatitis is noted in the old chart 9/17; suicidal ideation also reported hospitalized 2/18  Some hypertension   She has had a couple falls.  She hit her head.  Looking for a second opinion she went to see [name] over at [place].  He stopped some of her medications that he thought might be contributing to her dizziness.  An echocardiogram had been read as moderate pulmonary hypertension and she was referred for right heart catheterization.  A repeat echo was read as mild.  She continues to have recurrent syncope.   She has little warning.  Frequent tachypalpitations.  Interrogation of her device as previously noted demonstrated aggressive CLS with pacing rates in the 140s/50s  She continues to struggle with the stresses related to caring for her father.  She saw Dr [name], read his notes and he put her on ProAmatine.  However, she has noted that her blood pressures at work have been in the 180 range and she has stopped it.  We have ordered urinary metanephrines; they are pending.      Records and Results Reviewed   Past Medical History:  Diagnosis Date  . ADHD (attention deficit hyperactivity disorder)   . Alcohol abuse   . 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 Allergy   . Anxiety   . Arthritis   . Bipolar disorder (HCC)   . Cardiac arrest (HCC) 06-2015, 08-2016   x3- pt has pacemaker   . Chronic pain    back  . Depression   . Fatty liver   . Fracture of lateral malleolus of right  fibula   . Hepatomegaly   . Hyperlipidemia   . Hypertension   . OCD (obsessive compulsive disorder)   . Pancreatic pseudocyst   . Panic attack   . 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 Syncope    neurocardiogenic  . Third degree heart block [facility])     Past Surgical History:  Procedure Laterality Date  . ABDOMINAL HYSTERECTOMY    . ANTERIOR AND POSTERIOR REPAIR    . BUNIONECTOMY Bilateral   . CESAREAN SECTION    . EP IMPLANTABLE DEVICE N/A 10/12/2016   Procedure: Pacemaker Implant;  Surgeon: Will Jorja Loa, MD;  [facility];  Service: Cardiovascular;  Laterality: N/A;  . finfger surgery    . FINGER SURGERY    . LTCS    . MYRINGOTOMY    . ORIF ANKLE FRACTURE Right 07/25/2015   Procedure: OPEN REDUCTION INTERNAL FIXATION (ORIF) RIGHT ANKLE ;  Surgeon: Sheral Apley, MD;  Location: [facility];  Service: Orthopedics;  Laterality: Right;  . PACEMAKER PLACEMENT    . RHINOPLASTY    . TONSILLECTOMY    . TUBAL LIGATION      Current Outpatient Medications  Medication Sig Dispense Refill  . ALPRAZolam (XANAX) 0.5 MG tablet Take 0.25 mg by mouth 2 (two) times daily as needed for  anxiety or sleep. 1./-1 tablet 2-3 times a day     . amphetamine-dextroamphetamine (ADDERALL) 30 MG tablet Take 30-60 mg by mouth daily.    . ARIPiprazole (ABILIFY) 15 MG tablet Take 15 mg by mouth daily.     Marland Kitchen aspirin EC 81 MG tablet Take 1 tablet (81 mg total) by mouth daily. For heart health    . divalproex (DEPAKOTE) 250 MG DR tablet Take 250 mg by mouth. Taking 3 at QHS    . DULoxetine (CYMBALTA) 30 MG capsule Take 30 mg by mouth daily. 2 caps at bedtime    . fenofibrate (TRICOR) 145 MG tablet Take 145 mg by mouth daily.     . folic acid (FOLVITE) 1 MG tablet Take 1 mg by mouth daily.    Marland Kitchen gabapentin (NEURONTIN) 300 MG capsule Take 300 mg by mouth at bedtime.     Marland Kitchen glipiZIDE (GLUCOTROL XL) 10 MG 24  hr tablet Take 5 mg by mouth daily with breakfast.     . metFORMIN (GLUCOPHAGE) 1000 MG tablet Take 1,000 mg by mouth 2 (two) times daily.    . midodrine (PROAMATINE) 5 MG tablet Take 1 tablet (5 mg total) by mouth every 4 (four) hours while awake. 420 tablet 3  . Multiple Vitamins-Minerals (CENTROVITE) TABS Take 1 tablet by mouth daily.    Marland Kitchen omeprazole (PRILOSEC) 40 MG capsule Take 40 mg by mouth 2 (two) times daily.    . QUEtiapine (SEROQUEL) 100 MG tablet Take 100 mg by mouth at bedtime. 1-2 tablet s at bedtime    . rOPINIRole (REQUIP) 0.5 MG tablet Take 0.5 mg by mouth at bedtime.    . thiamine 100 MG tablet Take 1 tablet (100 mg total) by mouth daily. For low thiamine 1 tablet 0   Current Facility-Administered Medications  Medication Dose Route Frequency Provider Last Rate Last Admin  . 0.9 %  sodium chloride infusion  500 mL Intravenous Continuous Nandigam, Eleonore Chiquito, MD        Allergies  Allergen Reactions  . Codeine Nausea And Vomiting  . Demerol [Meperidine] Other (See Comments)    Hallucinations   . Metoprolol Other (See Comments)    nightmares      Review of Systems negative except from HPI and PMH  Physical Exam BP 136/88   Pulse 90   Ht 5\' 4"  (1.626 m)   Wt 181 lb 9.6 oz (82.4 kg)   SpO2 98%   BMI 31.17 kg/m  Well developed and well nourished in no acute distress HENT normal Neck supple with JVP-flat Clear Device pocket well healed; without hematoma or erythema.  There is no tethering  Regular rate and rhythm, no  murmur Abd-soft with active BS No Clubbing cyanosis   edema Skin-warm and dry A & Oriented  Grossly normal sensory and motor function  ECG sinus @ 90  Observe for heart rate going from 90--120 back again.  Frequently.  Assessment and  Plan  Syncope neurally mediated/Dysautonomia  Complete Heart Block intermittent  Pulm hypertension   Pacemaker  -- Biotronik       Hypertension  Orthostatic hypertension  Atrial tachycardia   She  continues to have syncope.  She has orthostatic hypertension reflective of autonomic dysfunction.  Adrenergic status is under investigation.  We have reprogrammed her Biotronik pacemaker to decrease the rate response i.e. decrease CLS response, decrease max rate, and decrease the detection rate of atrial arrhythmias to 140 bpm from 200.  Given the amount of atrial tachycardia  we are seen on observation, we will begin her on low-dose flecainide 50 mg a day.  EGMs were reviewed at the time of the event and there is a very subtle distinction in the upstroke of her atrial tachycardia events.  enocuraged stress management

## 2020-01-23 NOTE — Patient Instructions (Signed)
Medication Instructions:  Your physician has recommended you make the following change in your medication:  Stop taking your Midodrine 5mg   Start taking Flecainide 50mg  1 tablet by mouth twice daily  Labwork: None ordered.  Testing/Procedures: None ordered.  Follow-Up: Your physician wants you to follow-up: by Virtual visit on 03/13/2020 at 2pm You will receive a reminder letter in the mail two months in advance. If you don't receive a letter, please call our office to schedule the follow-up appointment.  Remote monitoring is used to monitor your Pacemaker of ICD from home. This monitoring reduces the number of office visits required to check your device to one time per year. It allows to keep an eye on the functioning of your device to ensure it is working properly.   Any Other Special Instructions Will Be Listed Below (If Applicable).  If you need a refill on your cardiac medications before your next appointment, please call your pharmacy.

## 2020-01-28 ENCOUNTER — Telehealth: Payer: Self-pay | Admitting: Internal Medicine

## 2020-01-28 NOTE — Telephone Encounter (Signed)
Patient wanted to know if she needs to keep her appt with Dr. Graciela Husbands for Wednesday 02-03. She was just in to see Dr. Graciela Husbands on 01-27. Please advise

## 2020-01-29 ENCOUNTER — Telehealth: Payer: Medicaid Other | Admitting: Internal Medicine

## 2020-01-29 NOTE — Telephone Encounter (Signed)
Pt advised her next appointment with Dr Graciela Husbands is scheduled for March 18,2021 at 2pm.  Pt verbalizes understanding and agrees with plan.  Pt also reports she has purchased the [employer] as requested by Dr Graciela Husbands.

## 2020-01-30 ENCOUNTER — Encounter: Payer: Medicaid Other | Admitting: Internal Medicine

## 2020-02-06 NOTE — Progress Notes (Unsigned)
error 

## 2020-03-12 DIAGNOSIS — I471 Supraventricular tachycardia: Secondary | ICD-10-CM | POA: Insufficient documentation

## 2020-03-12 DIAGNOSIS — I4719 Other supraventricular tachycardia: Secondary | ICD-10-CM | POA: Insufficient documentation

## 2020-03-13 ENCOUNTER — Telehealth (INDEPENDENT_AMBULATORY_CARE_PROVIDER_SITE_OTHER): Payer: Medicaid Other | Admitting: Internal Medicine

## 2020-03-13 ENCOUNTER — Other Ambulatory Visit: Payer: Self-pay

## 2020-03-13 VITALS — HR 104 | Ht 64.0 in | Wt 179.0 lb

## 2020-03-13 DIAGNOSIS — R55 Syncope and collapse: Secondary | ICD-10-CM | POA: Diagnosis not present

## 2020-03-13 DIAGNOSIS — Z950 Presence of cardiac pacemaker: Secondary | ICD-10-CM

## 2020-03-13 DIAGNOSIS — I10 Essential (primary) hypertension: Secondary | ICD-10-CM

## 2020-03-13 DIAGNOSIS — Z79899 Other long term (current) drug therapy: Secondary | ICD-10-CM

## 2020-03-13 DIAGNOSIS — I272 Pulmonary hypertension, unspecified: Secondary | ICD-10-CM | POA: Diagnosis not present

## 2020-03-13 DIAGNOSIS — I442 Atrioventricular block, complete: Secondary | ICD-10-CM | POA: Diagnosis not present

## 2020-03-13 DIAGNOSIS — I471 Supraventricular tachycardia: Secondary | ICD-10-CM

## 2020-03-13 DIAGNOSIS — G901 Familial dysautonomia [Riley-Day]: Secondary | ICD-10-CM

## 2020-03-13 DIAGNOSIS — R000 Tachycardia, unspecified: Secondary | ICD-10-CM

## 2020-03-13 DIAGNOSIS — F439 Reaction to severe stress, unspecified: Secondary | ICD-10-CM

## 2020-03-13 NOTE — Patient Instructions (Signed)
Medication Instructions:  Your physician recommends that you continue on your current medications as directed. Please refer to the Current Medication list given to you today.  Labwork: 24 hour urine  - Metanephrines - order placed and released to [facility]  Testing/Procedures: None ordered.  Follow-Up: Your physician wants you to follow-up 06/16/2020 at 330pm with Dr Graciela Husbands.  You will receive a reminder letter in the mail two months in advance. If you don't receive a letter, please call our office to schedule the follow-up appointment.  Remote monitoring is used to monitor your Pacemaker of ICD from home. This monitoring reduces the number of office visits required to check your device to one time per year. It allows [place] to keep an eye on the functioning of your device to ensure it is working properly.   Any Other Special Instructions Will Be Listed Below (If Applicable).  Nurse visit for EKG 03/21/2020 at 2pm  If you need a refill on your cardiac medications before your next appointment, please call your pharmacy.

## 2020-03-13 NOTE — Progress Notes (Signed)
Electrophysiology TeleHealth Note   Due to national recommendations of social distancing due to COVID 19, an audio/video telehealth visit is felt to be most appropriate for this patient at this time.  See MyChart message from today for the patient's consent to telehealth for [facility].   Date:  03/13/2020   ID:  Brittney Cooper, DOB 06-30-1961, MRN [id number]  Location: patient's home  Provider location: [address] [place]  Evaluation Performed: Follow-up visit  PCP:  Cheron Schaumann., MD  Cardiologist:    Electrophysiologist:  SK   Chief Complaint:  Dysautonomia   History of Present Illness:    Brittney Cooper is a 59 y.o. female who presents via audio/video conferencing for a telehealth visit today.  Since last being seen in our clinic, the patient reports doing maybe a little bit better.    Less syncope events while standing  Stress w father  Weight gain    The patient denies symptoms of fevers, chills, cough, or new SOB worrisome for COVID 19.   Past Medical History:  Diagnosis Date  . ADHD (attention deficit hyperactivity disorder)   . Alcohol abuse   . 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 Allergy   . Anxiety   . Arthritis   . Bipolar disorder (HCC)   . Cardiac arrest (HCC) 06-2015, 08-2016   x3- pt has pacemaker   . Chronic pain    back  . Depression   . Fatty liver   . Fracture of lateral malleolus of right fibula   . Hepatomegaly   . Hyperlipidemia   . Hypertension   . OCD (obsessive compulsive disorder)   . Pancreatic pseudocyst   . Panic attack   . 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 Syncope    neurocardiogenic  . Third degree heart block [facility])     Past Surgical History:  Procedure Laterality Date  . ABDOMINAL HYSTERECTOMY    . ANTERIOR AND POSTERIOR REPAIR    . BUNIONECTOMY Bilateral   . CESAREAN SECTION    . EP IMPLANTABLE DEVICE  N/A 10/12/2016   Procedure: Pacemaker Implant;  Surgeon: Will Jorja Loa, MD;  Location: [facility];  Service: Cardiovascular;  Laterality: N/A;  . finfger surgery    . FINGER SURGERY    . LTCS    . MYRINGOTOMY    . ORIF ANKLE FRACTURE Right 07/25/2015   Procedure: OPEN REDUCTION INTERNAL FIXATION (ORIF) RIGHT ANKLE ;  Surgeon: Sheral Apley, MD;  Location: [facility];  Service: Orthopedics;  Laterality: Right;  . PACEMAKER PLACEMENT    . RHINOPLASTY    . TONSILLECTOMY    . TUBAL LIGATION      Current Outpatient Medications  Medication Sig Dispense Refill  . ALPRAZolam (XANAX) 0.5 MG tablet Take 0.25 mg by mouth 2 (two) times daily as needed for anxiety or sleep. 1./-1 tablet 2-3 times a day     . amphetamine-dextroamphetamine (ADDERALL) 30 MG tablet Take 30-60 mg by mouth daily.    . ARIPiprazole (ABILIFY) 15 MG tablet Take 15 mg by mouth daily.     Marland Kitchen aspirin EC 81 MG tablet Take 1 tablet (81 mg total) by mouth daily. For heart health    . divalproex (DEPAKOTE) 250 MG DR tablet Take 250 mg by mouth. Taking 3 at QHS    . DULoxetine (CYMBALTA) 30 MG capsule  Take 30 mg by mouth daily. 2 caps at bedtime    . fenofibrate (TRICOR) 145 MG tablet Take 145 mg by mouth daily.     . flecainide (TAMBOCOR) 50 MG tablet Take 1 tablet (50 mg total) by mouth 2 (two) times daily. 762 tablet 3  . folic acid (FOLVITE) 1 MG tablet Take 1 mg by mouth daily.    Marland Kitchen gabapentin (NEURONTIN) 300 MG capsule Take 300 mg by mouth at bedtime.     Marland Kitchen glipiZIDE (GLUCOTROL XL) 10 MG 24 hr tablet Take 5 mg by mouth daily with breakfast.     . magnesium gluconate (MAGONATE) 500 MG tablet Take 500 mg by mouth daily.    . metFORMIN (GLUCOPHAGE) 1000 MG tablet Take 1,000 mg by mouth 2 (two) times daily.    . Multiple Vitamins-Minerals (CENTROVITE) TABS Take 1 tablet by mouth daily.    Marland Kitchen omeprazole (PRILOSEC) 40 MG capsule Take 40 mg by mouth 2 (two) times daily.    . QUEtiapine (SEROQUEL) 100 MG  tablet Take 100 mg by mouth at bedtime. 1-2 tablet s at bedtime    . rOPINIRole (REQUIP) 0.5 MG tablet Take 0.5 mg by mouth at bedtime.    . thiamine 100 MG tablet Take 1 tablet (100 mg total) by mouth daily. For low thiamine 1 tablet 0   Current Facility-Administered Medications  Medication Dose Route Frequency Provider Last Rate Last Admin  . 0.9 %  sodium chloride infusion  500 mL Intravenous Continuous Nandigam, Venia Minks, MD        Allergies:   Codeine, Demerol [meperidine], and Metoprolol   Social History:  The patient  reports that she has been smoking cigarettes. She has a 15.00 pack-year smoking history. She has never used smokeless tobacco. She reports current alcohol use. She reports that she does not use drugs.   Family History:  The patient's   family history includes Colon cancer in her paternal grandfather; Colon polyps in her father and mother; Kidney disease in her mother; Lung cancer in her father; Stomach cancer in her maternal grandfather; Stroke in her mother.   ROS:  Please see the history of present illness.   All other systems are personally reviewed and negative.    Exam:    Vital Signs:  Pulse (!) 104   Ht 5\' 4"  (1.626 m)   Wt 179 lb (81.2 kg)   BMI 30.73 kg/m     Well appearing, alert and conversant, regular work of breathing,  good skin color Eyes- anicteric, neuro- grossly intact, skin- no apparent rash or lesions or cyanosis, mouth- oral mucosa is pink   Labs/Other Tests and Data Reviewed:    Recent Labs: 12/20/2019: BUN 14; Creatinine, Ser 0.78; Hemoglobin 13.9; Platelets 294; Potassium 3.4; Sodium 140   Wt Readings from Last 3 Encounters:  03/13/20 179 lb (81.2 kg)  01/23/20 181 lb 9.6 oz (82.4 kg)  01/17/20 168 lb (76.2 kg)     Other studies personally reviewed: Additional studies/ records that were reviewed today include   The patient presents wearable device technology report for my review today. On my review, the patient presents with  Kardia  tracings from *3/18 The tracings reveal NSR  Last device remote is reviewed from [place] PDF dated 1/21 which reveals normal device function,   arrhythmias - none    ASSESSMENT & PLAN:   Syncope neurally mediated/Dysautonomia  Complete Heart Block intermittent  Pulm hypertension   Pacemaker  -- Biotronik  Hypertension  Orthostatic hypertension  Atrial tachycardia-- Flecainide   Stress  Continues to struggle with syncope although less frequently than before-- she notably has not hurt herself, but once, although she describes insufficient warning to protect herself  We have reviewed isometrics while standing--she is wearing compression.  Her pacemaker function was reprogrammed at last visit to less aggressive CLS  She is tolerating the flecainide, and has had less arrhythmia, but await next device interrogation for validation  She has been concerned about the drug intxn between citolpram Rx by [facility] and the flecainide.  This results from flecainide clearance prolongation, and would suspect we can track flecainide clearance and cardiac effect by QRS /PR intervals  Continue  Working on managing her stress with her father    COVID 62 screen The patient denies symptoms of COVID 19 at this time.  The importance of social distancing was discussed today.  Follow-up: 25m office Next remote: As Scheduled   Current medicines are reviewed at length with the patient today.   The patient does not have concerns regarding her medicines.  The following changes were made today:  none  Labs/ tests ordered today include: ECG for flecainide Urine for metanephrines Orders Placed This Encounter  Procedures  . Metanephrines, Urine, 24 hour    Future tests ( post COVID )     Patient Risk:  after full review of this patients clinical status, I feel that they are at moderate risk at this time.  Today, I have spent 19  minutes with the patient with telehealth technology  discussing the above.  Signed, Sherryl Manges, MD  03/13/2020 4:54 PM     [facility] [address] [place] [address] [phone] (office) [phone] (fax)

## 2020-03-21 ENCOUNTER — Ambulatory Visit: Payer: Medicaid Other

## 2020-03-24 ENCOUNTER — Ambulatory Visit: Payer: Medicaid Other

## 2020-04-08 ENCOUNTER — Other Ambulatory Visit: Payer: Self-pay

## 2020-04-08 ENCOUNTER — Ambulatory Visit (INDEPENDENT_AMBULATORY_CARE_PROVIDER_SITE_OTHER): Payer: Medicaid Other | Admitting: Cardiology

## 2020-04-08 VITALS — BP 140/106 | HR 78 | Wt 182.8 lb

## 2020-04-08 DIAGNOSIS — R002 Palpitations: Secondary | ICD-10-CM

## 2020-04-08 DIAGNOSIS — Z79899 Other long term (current) drug therapy: Secondary | ICD-10-CM | POA: Diagnosis not present

## 2020-04-08 DIAGNOSIS — G901 Familial dysautonomia [Riley-Day]: Secondary | ICD-10-CM | POA: Diagnosis not present

## 2020-04-08 NOTE — Patient Instructions (Addendum)
Medication Instructions:  Continue on your current medications as directed.   Labwork: None ordered.  Testing/Procedures: EKG today  Follow-Up: Keep scheduled appointment with Dr Graciela Husbands for 06/16/2020.  Any Other Special Instructions Will Be Listed Below (If Applicable).  If you need a refill on your cardiac medications before your next appointment, please call your pharmacy.

## 2020-04-08 NOTE — Progress Notes (Signed)
EKG for initiation of Flecainide 50mg  bid  Dr is requesting visit  Patient started Flecainide the end of January 2021 and states she is tolerating medication well.  Patient reports she cannot tell much difference in her symptoms taking Flecainide.  She continues to have dizziness and passes out about twice monthly.  Her blood pressure is elevated at 140/106 today.  Heart rate is 78.  She reports her B/P has been elevated more lately.  She has purchased a wrist B/P cuff for home use and is using her [employer].    EKG reviewed by Dr February 2021 today who states" QRS looks good"  Pt to continue current medications and will forward to Dr Anne Fu for review as well.

## 2020-04-09 ENCOUNTER — Ambulatory Visit (INDEPENDENT_AMBULATORY_CARE_PROVIDER_SITE_OTHER): Payer: Medicaid Other | Admitting: *Deleted

## 2020-04-09 DIAGNOSIS — I442 Atrioventricular block, complete: Secondary | ICD-10-CM | POA: Diagnosis not present

## 2020-04-09 LAB — CUP PACEART REMOTE DEVICE CHECK
Date Time Interrogation Session: 20210414062923
Implantable Lead Implant Date: 20171017
Implantable Lead Implant Date: 20171017
Implantable Lead Location: 753859
Implantable Lead Location: 753860
Implantable Lead Model: 377
Implantable Lead Model: 377
Implantable Lead Serial Number: 49593512
Implantable Lead Serial Number: 49620910
Implantable Pulse Generator Implant Date: 20171017
Pulse Gen Model: 394969
Pulse Gen Serial Number: 68784192

## 2020-04-09 NOTE — Progress Notes (Signed)
PPM Remote  

## 2020-04-14 LAB — METANEPHRINES, URINE, 24 HOUR
Metaneph Total, Ur: 90 ug/L
Metanephrines, 24H Ur: 54 ug/24 hr (ref 36–209)
Normetanephrine, 24H Ur: 201 ug/24 hr (ref 131–612)
Normetanephrine, Ur: 335 ug/L

## 2020-04-22 NOTE — Telephone Encounter (Signed)
M  thanks  See if we can get a sense of how long her BP is up or down Sometimes we can use hydralazine for BP when its up ( as long as high for hours at a time Thanks SK

## 2020-05-02 ENCOUNTER — Emergency Department (HOSPITAL_BASED_OUTPATIENT_CLINIC_OR_DEPARTMENT_OTHER)
Admission: EM | Admit: 2020-05-02 | Discharge: 2020-05-02 | Disposition: A | Discharge status: Home / Self Care | Payer: Medicare Other | Attending: Emergency Medicine | Admitting: Emergency Medicine

## 2020-05-02 ENCOUNTER — Other Ambulatory Visit: Payer: Self-pay

## 2020-05-02 ENCOUNTER — Emergency Department (HOSPITAL_BASED_OUTPATIENT_CLINIC_OR_DEPARTMENT_OTHER): Payer: Medicare Other

## 2020-05-02 ENCOUNTER — Encounter (HOSPITAL_BASED_OUTPATIENT_CLINIC_OR_DEPARTMENT_OTHER): Payer: Self-pay | Admitting: *Deleted

## 2020-05-02 DIAGNOSIS — Z950 Presence of cardiac pacemaker: Secondary | ICD-10-CM | POA: Insufficient documentation

## 2020-05-02 DIAGNOSIS — R519 Headache, unspecified: Secondary | ICD-10-CM | POA: Diagnosis not present

## 2020-05-02 DIAGNOSIS — F17210 Nicotine dependence, cigarettes, uncomplicated: Secondary | ICD-10-CM | POA: Insufficient documentation

## 2020-05-02 DIAGNOSIS — I10 Essential (primary) hypertension: Secondary | ICD-10-CM

## 2020-05-02 DIAGNOSIS — Z79899 Other long term (current) drug therapy: Secondary | ICD-10-CM | POA: Insufficient documentation

## 2020-05-02 DIAGNOSIS — Z794 Long term (current) use of insulin: Secondary | ICD-10-CM | POA: Diagnosis not present

## 2020-05-02 DIAGNOSIS — E119 Type 2 diabetes mellitus without complications: Secondary | ICD-10-CM | POA: Diagnosis not present

## 2020-05-02 LAB — CBC WITH DIFFERENTIAL/PLATELET
Abs Immature Granulocytes: 0.04 10*3/uL (ref 0.00–0.07)
Basophils Absolute: 0 10*3/uL (ref 0.0–0.1)
Basophils Relative: 1 %
Eosinophils Absolute: 0.1 10*3/uL (ref 0.0–0.5)
Eosinophils Relative: 2 %
HCT: 43.9 % (ref 36.0–46.0)
Hemoglobin: 15.2 g/dL — ABNORMAL HIGH (ref 12.0–15.0)
Immature Granulocytes: 1 %
Lymphocytes Relative: 30 %
Lymphs Abs: 2 10*3/uL (ref 0.7–4.0)
MCH: 32.8 pg (ref 26.0–34.0)
MCHC: 34.6 g/dL (ref 30.0–36.0)
MCV: 94.8 fL (ref 80.0–100.0)
Monocytes Absolute: 0.6 10*3/uL (ref 0.1–1.0)
Monocytes Relative: 9 %
Neutro Abs: 3.8 10*3/uL (ref 1.7–7.7)
Neutrophils Relative %: 57 %
Platelets: 290 10*3/uL (ref 150–400)
RBC: 4.63 MIL/uL (ref 3.87–5.11)
RDW: 12.8 % (ref 11.5–15.5)
WBC: 6.6 10*3/uL (ref 4.0–10.5)
nRBC: 0 % (ref 0.0–0.2)

## 2020-05-02 LAB — BASIC METABOLIC PANEL
Anion gap: 11 (ref 5–15)
BUN: 17 mg/dL (ref 6–20)
CO2: 26 mmol/L (ref 22–32)
Calcium: 9.4 mg/dL (ref 8.9–10.3)
Chloride: 101 mmol/L (ref 98–111)
Creatinine, Ser: 0.84 mg/dL (ref 0.44–1.00)
GFR calc Af Amer: >60 mL/min (ref >60)
GFR calc non Af Amer: >60 mL/min (ref >60)
Glucose, Bld: 136 mg/dL — ABNORMAL HIGH (ref 70–99)
Potassium: 3.7 mmol/L (ref 3.5–5.1)
Sodium: 138 mmol/L (ref 135–145)

## 2020-05-02 MED ORDER — SODIUM CHLORIDE 0.9 % IV BOLUS
500.0000 mL | Freq: Once | INTRAVENOUS | Status: AC
  Administered 2020-05-02: 20:00:00 500 mL via INTRAVENOUS

## 2020-05-02 NOTE — ED Notes (Signed)
Pt in radiology at this time. 

## 2020-05-02 NOTE — ED Provider Notes (Addendum)
[facility] EMERGENCY DEPARTMENT Provider Note   CSN: [id number] Arrival date & time: 05/02/20  1811    History Chief Complaint  Patient presents with  . Hypertension    Brittney Cooper is a 59 y.o. female with past medical history significant for Polar disorder, anxiety, cardiac arrest, chronic back pain, third-degree heart block s/p pacemaker, CAD, hypertension, hyperlipidemia who presents for evaluation of hypertension.  Patient states she has had elevated blood pressures x2 weeks.  Has been followed by Dr. Graciela Husbands with cardiology.  She is on flecainide for arrhythmias however no antihypertensive, per patient.  States she does have history of recurrent syncope however has not had a syncopal event since she was last seen by cardiology.  She was noted today to be hypertensive with systolic into the 200s and was sent here to the emergency department for evaluation.  Patient states she has had a mild headache however denies sudden onset thunderclap headache.  Denies facial droop, unilateral weakness, paresthesias, chest pain, shortness of breath, nausea, vomiting, vision changes, difficulty with word finding, unilateral redness, swelling, warm, abdominal pain.  Denies additional aggravating or relieving factors.  History obtained from patient and past medical records.  No interpreter is used.  HPI     Past Medical History:  Diagnosis Date  . ADHD (attention deficit hyperactivity disorder)   . Alcohol abuse   . 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 Allergy   . Anxiety   . Arthritis   . Bipolar disorder (HCC)   . Cardiac arrest (HCC) 06-2015, 08-2016   x3- pt has pacemaker   . Chronic pain    back  . Depression   . Fatty liver   . Fracture of lateral malleolus of right fibula   . Hepatomegaly   . Hyperlipidemia   . Hypertension   . OCD (obsessive compulsive disorder)   . Pancreatic pseudocyst   . Panic attack   . 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 Syncope    neurocardiogenic  . Third degree heart block [facility])     Patient Active Problem List   Diagnosis Date Noted  . Atrial tachycardia (HCC) 03/12/2020  . Dysautonomia (HCC) 01/13/2020  . Pacemaker 01/13/2020  . Type 2 diabetes mellitus with hyperglycemia, without long-term current use of insulin (HCC) 02/09/2017  . MDD (major depressive disorder), recurrent severe, 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  . Complete heart block (HCC)   . Palpitation 10/11/2016  . Palpitations 10/11/2016  . Acute pancreatitis 09/15/2016  . Alcohol abuse 09/15/2016  . ADHD (attention deficit hyperactivity disorder) 09/15/2016  . Tobacco use 09/15/2016  . Anxiety 09/15/2016  . Hypokalemia 09/15/2016  . Pancreatitis 07/09/2016  . Alcoholic pancreatitis 07/08/2016  . Alcohol withdrawal (HCC) 07/08/2016  . Bipolar disorder (HCC) 07/08/2016  . 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 Left shoulder pain 09/09/2014    Past Surgical History:  Procedure Laterality Date  . ABDOMINAL HYSTERECTOMY    . ANTERIOR AND POSTERIOR REPAIR    . BUNIONECTOMY Bilateral   . CESAREAN SECTION    . EP IMPLANTABLE DEVICE N/A 10/12/2016   Procedure: Pacemaker Implant;  Surgeon: Will Jorja Loa, MD;  Location: [facility] INVASIVE CV LAB;  Service: Cardiovascular;  Laterality: N/A;  . finfger surgery    . FINGER SURGERY    . LTCS    . MYRINGOTOMY    . ORIF ANKLE FRACTURE Right 07/25/2015   Procedure: OPEN REDUCTION INTERNAL  FIXATION (ORIF) RIGHT ANKLE ;  Surgeon: Sheral Apley, MD;  Location: [facility];  Service: Orthopedics;  Laterality: Right;  . PACEMAKER PLACEMENT    . RHINOPLASTY    . TONSILLECTOMY    . TUBAL LIGATION       OB History   No obstetric history on file.     Family History  Problem Relation Age of Onset  . Stroke Mother   . Kidney disease Mother   . Colon polyps Mother   . Lung cancer Father        stage IV   .  Colon polyps Father   . Stomach cancer Maternal Grandfather   . Colon cancer Paternal Grandfather   . Rectal cancer Neg Hx   . Esophageal cancer Neg Hx   . Pancreatic cancer Neg Hx     Social History   Tobacco Use  . Smoking status: Current Every Day Smoker    Packs/day: 1.00    Years: 15.00    Pack years: 15.00    Types: Cigarettes  . Smokeless tobacco: Never Used  Substance Use Topics  . Alcohol use: Yes    Alcohol/week: 0.0 standard drinks    Comment: drinks for couple of days, then no alcohol for days  . Drug use: No    Home Medications Prior to Admission medications   Medication Sig Start Date End Date Taking? Authorizing Provider  ALPRAZolam (XANAX) 0.5 MG tablet Take 0.25 mg by mouth 2 (two) times daily as needed for anxiety or sleep. 1./-1 tablet 2-3 times a day     [provider]  amphetamine-dextroamphetamine (ADDERALL) 30 MG tablet Take 30-60 mg by mouth daily.    [provider]  ARIPiprazole (ABILIFY) 15 MG tablet Take 15 mg by mouth daily.  11/12/19   [provider]  aspirin EC 81 MG tablet Take 1 tablet (81 mg total) by mouth daily. For heart health 02/09/17   Armandina Stammer I, NP  divalproex (DEPAKOTE) 250 MG DR tablet Take 250 mg by mouth. Taking 3 at QHS    [provider]  DULoxetine (CYMBALTA) 30 MG capsule Take 30 mg by mouth daily. 2 caps at bedtime    [provider]  fenofibrate (TRICOR) 145 MG tablet Take 145 mg by mouth daily.  03/14/19   [provider]  flecainide (TAMBOCOR) 50 MG tablet Take 1 tablet (50 mg total) by mouth 2 (two) times daily. 01/23/20   Duke Salvia, MD  folic acid (FOLVITE) 1 MG tablet Take 1 mg by mouth daily. 12/25/19   [provider]  gabapentin (NEURONTIN) 300 MG capsule Take 300 mg by mouth at bedtime.  09/24/19   [provider]  glipiZIDE (GLUCOTROL XL) 10 MG 24 hr tablet Take 5 mg by mouth daily with breakfast.  05/25/17   [provider]    magnesium gluconate (MAGONATE) 500 MG tablet Take 500 mg by mouth daily.    [provider]  metFORMIN (GLUCOPHAGE) 1000 MG tablet Take 1,000 mg by mouth 2 (two) times daily. 12/25/19   [provider]  Multiple Vitamins-Minerals (CENTROVITE) TABS Take 1 tablet by mouth daily. 04/25/18   [provider]  omeprazole (PRILOSEC) 40 MG capsule Take 40 mg by mouth 2 (two) times daily.    [provider]  QUEtiapine (SEROQUEL) 100 MG tablet Take 100 mg by mouth at bedtime. 1-2 tablet s at bedtime    [provider]  rOPINIRole (REQUIP) 0.5 MG tablet  Take 0.5 mg by mouth at bedtime.    [provider]  thiamine 100 MG tablet Take 1 tablet (100 mg total) by mouth daily. For low thiamine 02/09/17   Lindell Spar I, NP    Allergies    Codeine, Demerol [meperidine], and Metoprolol  Review of Systems   Review of Systems  Constitutional: Negative.   HENT: Negative.   Respiratory: Negative.   Cardiovascular: Negative.   Gastrointestinal: Negative.   Genitourinary: Negative.   Musculoskeletal: Negative.   Skin: Negative.   Neurological: Positive for headaches. Negative for dizziness, seizures, syncope, facial asymmetry, speech difficulty, weakness, light-headedness and numbness.  All other systems reviewed and are negative.   Physical Exam Updated Vital Signs BP (!) 142/100   Pulse 82   Temp 98 F (36.7 C) (Oral)   Resp (!) 27   Ht 5\' 4"  (1.626 m)   Wt 81.6 kg   SpO2 97%   BMI 30.90 kg/m   Physical Exam  Physical Exam  Constitutional: Pt is oriented to person, place, and time. Pt appears well-developed and well-nourished. No distress.  HENT:  Head: Normocephalic and atraumatic.  Mouth/Throat: Oropharynx is clear and moist.  Eyes: Conjunctivae and EOM are normal. Pupils are equal, round, and reactive to light. No scleral icterus.  No horizontal, vertical or rotational nystagmus  Neck: Normal range of motion. Neck supple.  Full  active and passive ROM without pain No midline or paraspinal tenderness No nuchal rigidity or meningeal signs  Cardiovascular: Normal rate, regular rhythm and intact distal pulses.   Pulmonary/Chest: Effort normal and breath sounds normal. No respiratory distress. Pt has no wheezes. No rales.  Abdominal: Soft. Bowel sounds are normal. There is no tenderness. There is no rebound and no guarding.  Musculoskeletal: Normal range of motion.  Lymphadenopathy:    No cervical adenopathy.  Neurological: Pt. is alert and oriented to person, place, and time. He has normal reflexes. No cranial nerve deficit.  Exhibits normal muscle tone. Coordination normal.  Mental Status:  Alert, oriented, thought content appropriate. Speech fluent without evidence of aphasia. Able to follow 2 step commands without difficulty.  Cranial Nerves:  II:  Peripheral visual fields grossly normal, pupils equal, round, reactive to light III,IV, VI: ptosis not present, extra-ocular motions intact bilaterally  V,VII: smile symmetric, facial light touch sensation equal VIII: hearing grossly normal bilaterally  IX,X: midline uvula rise  XI: bilateral shoulder shrug equal and strong XII: midline tongue extension  Motor:  5/5 in upper and lower extremities bilaterally including strong and equal grip strength and dorsiflexion/plantar flexion Sensory: Pinprick and light touch normal in all extremities.  Deep Tendon Reflexes: 2+ and symmetric  Cerebellar: normal finger-to-nose with bilateral upper extremities Gait: normal gait and balance CV: distal pulses palpable throughout   Skin: Skin is warm and dry. No rash noted. Pt is not diaphoretic.  Psychiatric: Pt has a normal mood and affect. Behavior is normal. Judgment and thought content normal.  Nursing note and vitals reviewed. ED Results / Procedures / Treatments   Labs (all labs ordered are listed, but only abnormal results are displayed) Labs Reviewed  CBC WITH  DIFFERENTIAL/PLATELET - Abnormal; Notable for the following components:      Result Value   Hemoglobin 15.2 (*)    All other components within normal limits  BASIC METABOLIC PANEL - Abnormal; Notable for the following components:   Glucose, Bld 136 (*)    All other components within normal limits    EKG EKG Interpretation  Date/Time:  Friday May 02 2020 19:53:47 EDT Ventricular Rate:  78 PR Interval:    QRS Duration: 61 QT Interval:  474 QTC Calculation: 540 R Axis:   14 Text Interpretation: Sinus rhythm Borderline T abnormalities, diffuse leads Prolonged QT interval no longer paced Otherwise no significant change Confirmed by Melene Plan [phone]) on 05/02/2020 8:01:25 PM   Radiology DG Chest 2 View  Result Date: 05/02/2020 CLINICAL DATA:  Headache and dizziness.  Hypertension. EXAM: CHEST - 2 VIEW COMPARISON:  05/02/2019 FINDINGS: Cardiac silhouette is normal in size. No mediastinal or hilar masses or evidence of adenopathy. Left anterior chest wall sequential pacemaker is stable, leads well positioned. Clear lungs.  No pleural effusion or pneumothorax. Skeletal structures are intact. IMPRESSION: No active cardiopulmonary disease. Electronically Signed   By: Amie Portland M.D.   On: 05/02/2020 19:55   CT Head Wo Contrast  Result Date: 05/02/2020 CLINICAL DATA:  Headache, hypertension EXAM: CT HEAD WITHOUT CONTRAST TECHNIQUE: Contiguous axial images were obtained from the base of the skull through the vertex without intravenous contrast. COMPARISON:  12/05/2016 FINDINGS: Brain: The brainstem, cerebellum, cerebral peduncles, thalami, basal ganglia, basilar cisterns, and ventricular system appear within normal limits. No intracranial hemorrhage, mass lesion, or acute CVA. Vascular: Unremarkable Skull: Unremarkable Sinuses/Orbits: Unremarkable Other: Chronic scar along the left posterior parietal scalp. IMPRESSION: 1. No intracranial abnormality observed. Electronically Signed   By: Gaylyn Rong M.D.   On: 05/02/2020 19:36    Procedures Procedures (including critical care time)  Medications Ordered in ED Medications  sodium chloride 0.9 % bolus 500 mL ( Intravenous Stopped 05/02/20 2047)    ED Course  I have reviewed the triage vital signs and the nursing notes.  Pertinent labs & imaging results that were available during my care of the patient were reviewed by me and considered in my medical decision making (see chart for details).  59 year old presents for evaluation of elevated blood pressure.  She is currently followed by cardiology, Dr. Graciela Husbands.  Had a "mild headache" earlier today which self resolved.  No sudden onset thunderclap headache.  She has a nonfocal neuro exam without deficits.  Denies any chest pain, shortness of breath.  Apparently had systolic blood pressures greater than 220 sustained at home.  On arrival she is hypertensive at 166/105 with recheck at 142/100.  She appears otherwise well.  Given earlier headache we will plan on obtaining labs, imaging and reassess.  Labs, imaging personally reviewed and interpreted:   CT head without acute findings DG chest without acute infiltrates, cardiomegaly, pulmonary edema, pneumothorax EKG without STEMI CBC without leukocytosis Metabolic panel with mild hyperglycemia at 136 however no additional chronic, renal abnormality  Patient reassessed.  Appears overall well.  Playing a game on her computer.  Tolerating p.o. intake without difficulty.  She continues have a nonfocal neuro exam but deficits.  Has no complaints.  Discussed labs and imaging.  Discussed with patient close follow-up with cardiology.  She is currently being worked up outpatient with cardiology for hypertension.  She has had intermittent hypertensive episodes and is currently undergoing work-up with metanephrines for a pheo.  Has had similar hypertensive episodes which have self resolved.  She is follow-up with cardiology within the next 2 weeks.   Will hold on starting any medications at this time.  I low suspicion for hypertensive urgency or emergency.  HA resolved prior to ED evaluation. Presentation non concerning for CVA, dissection, SAH, ICH, Meningitis, or temporal arteritis.  The patient  has been appropriately medically screened and/or stabilized in the ED. I have low suspicion for any other emergent medical condition which would require further screening, evaluation or treatment in the ED or require inpatient management.  Patient is hemodynamically stable and in no acute distress.  Patient able to ambulate in department prior to ED.  Evaluation does not show acute pathology that would require ongoing or additional emergent interventions while in the emergency department or further inpatient treatment.  I have discussed the diagnosis with the patient and answered all questions.  Pain is been managed while in the emergency department and patient has no further complaints prior to discharge.  Patient is comfortable with plan discussed in room and is stable for discharge at this time.  I have discussed strict return precautions for returning to the emergency department.  Patient was encouraged to follow-up with PCP/specialist refer to at discharge.    MDM Rules/Calculators/A&P                       Final Clinical Impression(s) / ED Diagnoses Final diagnoses:  Hypertension, unspecified type    Rx / DC Orders ED Discharge Orders    None       Vernice Mannina A, PA-C 05/02/20 2128    Takima Encina A, PA-C 05/02/20 2129    Melene PlanFloyd, Dan, DO 05/02/20 2240

## 2020-05-02 NOTE — Discharge Instructions (Signed)
Follow-up with cardiology.  Return if you have persistently elevated blood pressures.

## 2020-05-02 NOTE — Telephone Encounter (Signed)
Spoke with pt after receiving MyChart message that her B/P is currently 224/122 with pulse of 110.  Pt advised she should go to ED now for evaluation due to elevated B/P and pulse.  Pt denies any additional symptoms at this time.  Pt verbalized understanding and states she will call her family and go to ED now.

## 2020-05-02 NOTE — ED Triage Notes (Signed)
Pt reports HTN today. Pt denies HA. Reports that it was 224/122 PTA.

## 2020-05-16 ENCOUNTER — Ambulatory Visit (INDEPENDENT_AMBULATORY_CARE_PROVIDER_SITE_OTHER): Payer: Medicare Other | Admitting: Internal Medicine

## 2020-05-16 ENCOUNTER — Other Ambulatory Visit: Payer: Self-pay

## 2020-05-16 ENCOUNTER — Encounter: Payer: Self-pay | Admitting: Internal Medicine

## 2020-05-16 VITALS — BP 152/104 | HR 82 | Ht 64.0 in | Wt 182.0 lb

## 2020-05-16 DIAGNOSIS — I471 Supraventricular tachycardia: Secondary | ICD-10-CM | POA: Diagnosis not present

## 2020-05-16 DIAGNOSIS — I10 Essential (primary) hypertension: Secondary | ICD-10-CM

## 2020-05-16 DIAGNOSIS — Z950 Presence of cardiac pacemaker: Secondary | ICD-10-CM | POA: Diagnosis not present

## 2020-05-16 DIAGNOSIS — I442 Atrioventricular block, complete: Secondary | ICD-10-CM

## 2020-05-16 DIAGNOSIS — G901 Familial dysautonomia [Riley-Day]: Secondary | ICD-10-CM | POA: Diagnosis not present

## 2020-05-16 DIAGNOSIS — R002 Palpitations: Secondary | ICD-10-CM

## 2020-05-16 MED ORDER — HYDRALAZINE HCL 25 MG PO TABS
25.0000 mg | ORAL_TABLET | Freq: Four times a day (QID) | ORAL | 3 refills | Status: DC | PRN
Start: 2020-05-16 — End: 2020-08-14

## 2020-05-16 MED ORDER — MIDODRINE HCL 5 MG PO TABS: ORAL_TABLET | ORAL | 3 refills | Status: DC

## 2020-05-16 NOTE — Progress Notes (Signed)
Patient Care Team: Cheron Schaumann., MD as PCP - General (Internal Medicine)   HPI  Brittney Cooper is a 59 y.o. female Seen in followup for dysautonomic syncope.She had recurrent syncope with a monitor on.This was associated with a 6 second pause 2 . She had head trauma requiring laceration. She underwent pacing with a Biotronik CLS.  Other contributing complex medical comorbidities outlined in the old notes. Alcoholic induced pancreatitis is noted in the old chart 9/17; suicidal ideation also reported hospitalized 2/18  Some hypertension   She has had a couple falls.  She hit her head.  Looking for a second opinion she went to see [name] over at [place].  He stopped some of her medications that he thought might be contributing to her dizziness.  An echocardiogram had been read as moderate pulmonary hypertension and she was referred for right heart catheterization.  A repeat echo was read as mild.  She continues to have recurrent syncope; little warning    She saw Dr [name], read his notes and he put her on ProAmatine.  However, she has noted that her blood pressures at work have been in the 180 range and she has stopped it.  Atrial tach managed with flecainide   BP lability  86>>222 sys; metanephrines were normal 4/21  Continues to have syncope with little warning.  Try compressive wear.  Does not note anything specific in terms of triggers for blood pressure and/or syncope.  Tolerating flecainide but thinks that maybe is making her tired.   Event Recorder personnally reviewed    Records and Results Reviewed   Past Medical History:  Diagnosis Date  . ADHD (attention deficit hyperactivity disorder)   . Alcohol abuse   . 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 Allergy   . Anxiety   . Arthritis   . Bipolar disorder (HCC)   . Cardiac arrest (HCC) 06-2015, 08-2016   x3- pt has pacemaker   . Chronic pain    back  . Depression   . Fatty liver   .  Fracture of lateral malleolus of right fibula   . Hepatomegaly   . Hyperlipidemia   . Hypertension   . OCD (obsessive compulsive disorder)   . Pancreatic pseudocyst   . Panic attack   . 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 Syncope    neurocardiogenic  . Third degree heart block [facility])     Past Surgical History:  Procedure Laterality Date  . ABDOMINAL HYSTERECTOMY    . ANTERIOR AND POSTERIOR REPAIR    . BUNIONECTOMY Bilateral   . CESAREAN SECTION    . EP IMPLANTABLE DEVICE N/A 10/12/2016   Procedure: Pacemaker Implant;  Surgeon: Will Jorja Loa, MD;  Location: [facility];  Service: Cardiovascular;  Laterality: N/A;  . finfger surgery    . FINGER SURGERY    . LTCS    . MYRINGOTOMY    . ORIF ANKLE FRACTURE Right 07/25/2015   Procedure: OPEN REDUCTION INTERNAL FIXATION (ORIF) RIGHT ANKLE ;  Surgeon: Sheral Apley, MD;  Location: [facility];  Service: Orthopedics;  Laterality: Right;  . PACEMAKER PLACEMENT    . RHINOPLASTY    . TONSILLECTOMY    . TUBAL LIGATION      Current Outpatient Medications  Medication Sig Dispense Refill  . ALPRAZolam (XANAX) 0.5 MG tablet Take 0.25 mg by mouth  2 (two) times daily as needed for anxiety or sleep. 1./-1 tablet 2-3 times a day     . amphetamine-dextroamphetamine (ADDERALL) 30 MG tablet Take 30-60 mg by mouth daily.    . ARIPiprazole (ABILIFY) 15 MG tablet Take 15 mg by mouth daily.     Marland Kitchen aspirin EC 81 MG tablet Take 1 tablet (81 mg total) by mouth daily. For heart health    . divalproex (DEPAKOTE) 250 MG DR tablet Take 250 mg by mouth. Taking 3 at QHS    . DULoxetine (CYMBALTA) 30 MG capsule Take 30 mg by mouth daily. 2 caps at bedtime    . fenofibrate (TRICOR) 145 MG tablet Take 145 mg by mouth daily.     . flecainide (TAMBOCOR) 50 MG tablet Take 1 tablet (50 mg total) by mouth 2 (two) times daily. 180 tablet 3  . folic acid (FOLVITE) 1 MG  tablet Take 1 mg by mouth daily.    Marland Kitchen glipiZIDE (GLUCOTROL XL) 10 MG 24 hr tablet Take 5 mg by mouth daily with breakfast.     . magnesium gluconate (MAGONATE) 500 MG tablet Take 500 mg by mouth daily.    . metFORMIN (GLUCOPHAGE) 1000 MG tablet Take 1,000 mg by mouth 2 (two) times daily.    . Multiple Vitamins-Minerals (CENTROVITE) TABS Take 1 tablet by mouth daily.    Marland Kitchen omeprazole (PRILOSEC) 40 MG capsule Take 40 mg by mouth 2 (two) times daily.    . QUEtiapine (SEROQUEL) 100 MG tablet Take 100 mg by mouth at bedtime. 1-2 tablet s at bedtime    . rOPINIRole (REQUIP) 0.5 MG tablet Take 0.5 mg by mouth at bedtime.    . thiamine 100 MG tablet Take 1 tablet (100 mg total) by mouth daily. For low thiamine 1 tablet 0  . vitamin E 180 MG (400 UNITS) capsule Take 1 capsule by mouth daily.     Current Facility-Administered Medications  Medication Dose Route Frequency Provider Last Rate Last Admin  . 0.9 %  sodium chloride infusion  500 mL Intravenous Continuous Nandigam, Eleonore Chiquito, MD        Allergies  Allergen Reactions  . Codeine Nausea And Vomiting  . Demerol [Meperidine] Other (See Comments)    Hallucinations   . Metoprolol Other (See Comments)    nightmares      Review of Systems negative except from HPI and PMH  Physical Exam BP (!) 152/104   Pulse 82   Ht 5\' 4"  (1.626 m)   Wt 182 lb (82.6 kg)   SpO2 98%   BMI 31.24 kg/m  Well developed and well nourished in no acute distress HENT normal Neck supple with JVP-flat Clear Device pocket well healed; without hematoma or erythema.  There is no tethering  Regular rate and rhythm, no * murmur Abd-soft with active BS No Clubbing cyanosis  edema Skin-warm and dry A & Oriented  Grossly normal sensory and motor function  ECG   Assessment and  Plan  Syncope neurally mediated/Dysautonomia  Complete Heart Block intermittent  Pulm hypertension   Pacemaker  -- Biotronik       Hypertension-labile  Orthostatic  hypertension  Atrial tachycardia   She continues to have syncope.  Through the event recorder 12/20 these episodes were all associated with sinus rhythm.  She is wearing compressive wear to little avail.  She is having to back off on salt because of very high systolic blood pressures.  There is a great deal of blood pressure lability.  The differential diagnosis of labile blood pressure as reviewed in up-to-date is broad; many of the issues relate to neuro somatic concerns.  There are also some CNS issues with lesions and headaches.  I will reach out to Dr. Oval Linsey next week when she returns from vacation to see whether with her hypertension clinic she thinks she might be able to help [place] with this.  Whether she knows somebody at [facility].  In the interim I have asked her to take hydralazine 25 mg p.o. every 6 if her blood pressure systolic is greater than 220 and to take ProAmatine 5 mg every 6 before 1600 hrs. if her blood pressure is below 110.  We have reprogrammed her device so as to increase the CLS rate from 120--140 without changing the response factor above high.  Moreover, we have turned off the resting restraint.  We will reduce her flecainide from 50--25 mg twice daily.  More than 50% of 45 min was spent in counseling related to the above

## 2020-05-16 NOTE — Patient Instructions (Addendum)
Medication Instructions:  Your physician has recommended you make the following change in your medication:   You may take Proamatine 5mg  - 1 tablet by mouth every 6 hours before 1600 hours if B/P less than 110.  You may take Hydralazine 25mg   - 1 tablet by mouth every 6 hours as needed for B/P greater than 160.  Prescriptions have been sent to pharmacy we have on file   Labwork: None ordered.  Testing/Procedures: None ordered.  Follow-Up: Your physician wants you to follow-up in: 3 months. You will receive a reminder letter in the mail two months in advance. If you don't receive a letter, please call our office to schedule the follow-up appointment.  Remote monitoring is used to monitor your Pacemaker of ICD from home. This monitoring reduces the number of office visits required to check your device to one time per year. It allows to keep an eye on the functioning of your device to ensure it is working properly.  Any Other Special Instructions Will Be Listed Below (If Applicable).  If you need a refill on your cardiac medications before your next appointment, please call your pharmacy.

## 2020-05-28 DIAGNOSIS — I10 Essential (primary) hypertension: Secondary | ICD-10-CM

## 2020-06-16 ENCOUNTER — Encounter: Payer: Medicaid Other | Admitting: Internal Medicine

## 2020-07-08 LAB — CUP PACEART REMOTE DEVICE CHECK
Date Time Interrogation Session: 20210713094029
Implantable Lead Implant Date: 20171017
Implantable Lead Implant Date: 20171017
Implantable Lead Location: 753859
Implantable Lead Location: 753860
Implantable Lead Model: 377
Implantable Lead Model: 377
Implantable Lead Serial Number: 49593512
Implantable Lead Serial Number: 49620910
Implantable Pulse Generator Implant Date: 20171017
Pulse Gen Model: 394969
Pulse Gen Serial Number: 68784192

## 2020-07-09 ENCOUNTER — Ambulatory Visit (INDEPENDENT_AMBULATORY_CARE_PROVIDER_SITE_OTHER): Payer: Medicare Other | Admitting: *Deleted

## 2020-07-09 DIAGNOSIS — I442 Atrioventricular block, complete: Secondary | ICD-10-CM | POA: Diagnosis not present

## 2020-07-10 NOTE — Progress Notes (Signed)
Remote pacemaker transmission.   

## 2020-08-14 ENCOUNTER — Ambulatory Visit (INDEPENDENT_AMBULATORY_CARE_PROVIDER_SITE_OTHER): Payer: Medicare Other | Admitting: Cardiovascular Disease

## 2020-08-14 ENCOUNTER — Encounter: Payer: Self-pay | Admitting: Cardiovascular Disease

## 2020-08-14 ENCOUNTER — Other Ambulatory Visit: Payer: Self-pay

## 2020-08-14 VITALS — BP 142/102 | HR 138 | Ht 64.0 in | Wt 185.0 lb

## 2020-08-14 DIAGNOSIS — I10 Essential (primary) hypertension: Secondary | ICD-10-CM | POA: Diagnosis not present

## 2020-08-14 DIAGNOSIS — Z720 Tobacco use: Secondary | ICD-10-CM

## 2020-08-14 DIAGNOSIS — I442 Atrioventricular block, complete: Secondary | ICD-10-CM

## 2020-08-14 DIAGNOSIS — I4719 Other supraventricular tachycardia: Secondary | ICD-10-CM

## 2020-08-14 DIAGNOSIS — I471 Supraventricular tachycardia: Secondary | ICD-10-CM | POA: Diagnosis not present

## 2020-08-14 DIAGNOSIS — Z79899 Other long term (current) drug therapy: Secondary | ICD-10-CM

## 2020-08-14 MED ORDER — BISOPROLOL FUMARATE 5 MG PO TABS: 5.0000 mg | ORAL_TABLET | Freq: Every day | ORAL | 1 refills | Status: DC

## 2020-08-14 MED ORDER — DOXAZOSIN MESYLATE 1 MG PO TABS
1.0000 mg | ORAL_TABLET | Freq: Every day | ORAL | 1 refills | Status: DC
Start: 2020-08-14 — End: 2020-08-14

## 2020-08-14 MED ORDER — DOXAZOSIN MESYLATE 1 MG PO TABS: 1.0000 mg | ORAL_TABLET | Freq: Every day | ORAL | 1 refills | Status: DC

## 2020-08-14 NOTE — Progress Notes (Signed)
Hypertension Clinic Initial Assessment:    Date:  09/11/2020   ID:  Brittney Cooper, DOB 11-28-1961, MRN [id number]  PCP:  Brittney Schaumann., MD  Cardiologist:  No primary care provider on file.   Referring MD: Brittney Salvia, MD   CC: Hypertension  History of Present Illness:    Brittney Cooper is a 59 y.o. female with a hx of hypertension and dysautonomia and syncope s/p PPM, pulmonary hypertension, atrial tachycardia, EtOH related pancreatitis here to establish care in the hypertension clinic. She has a couple falls and syncope.  She wore an ambulatory monitor 11/2019 when she had a syncopal event and was noted to be in sinus rhythm.  In 2016 she had an episode of syncope and was found to have intermittent CHB.  This was associated with 6-second pauses.  She had a Biotronik pacemaker implanted.  She also saw Brittney Cooper at [facility] and trazodone was discontinued with some improvement in her dizziness and no additional episodes of syncope.  For the last 3 years she has had very labile hypertension.  She has been taking midodrine and hydralazine as needed.  However she has not needed the midodrine lately because her blood pressure has been so high.  Her blood pressure lately has been extremely high, as high as the 200s over 100s.  Lately she noticed that when her blood pressure was in a more normal range she feels lightheaded.  When this happens she goes to [facility] [employer].  It seems as though her blood pressure changes without reason.  It is associated with some palpitations.  Brittney Cooper notes that she is unable to exercise because of shortness of breath.  She feels like her dysautonomia is flaring lately.  She mostly eats at home.  She eats applesauce, yogurt or milk only because of GI upset.  She has very limited salt intake.  She does report orthopnea but no lower extremity edema or PND.  She drinks 1 can of soda daily.  She has been on Adderall since her 74s and hasn't noted any  change when it was held.  She stopped taking Cymbalta a few months ago and this does not seem to have helped.  She continues to smoke 1 pack of cigarettes daily.  She was able to quit when pregnant.   Previous antihypertensives: Metoprolol- nightmares    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   Allergy    Anxiety    Arthritis    Bipolar disorder (HCC)    Cardiac arrest (HCC) 06-2015, 08-2016   x3- pt has pacemaker    Chronic pain    back   Depression    Fatty liver    Fracture of lateral malleolus of right fibula    Hepatomegaly    Hyperlipidemia    Hypertension    OCD (obsessive compulsive disorder)    Pancreatic pseudocyst    Panic attack    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, working on OB floor as a nurse   Syncope    neurocardiogenic   Third degree heart block (HCC)     Past Surgical History:  Procedure Laterality Date   ABDOMINAL HYSTERECTOMY     ANTERIOR AND POSTERIOR REPAIR     BUNIONECTOMY Bilateral    CESAREAN SECTION     EP IMPLANTABLE DEVICE N/A  10/12/2016   Procedure: Pacemaker Implant;  Surgeon: Brittney Brittney Loa, MD;  Location: [facility];  Service: Cardiovascular;  Laterality: N/A;   finfger surgery     FINGER SURGERY     LTCS     MYRINGOTOMY     ORIF ANKLE FRACTURE Right 07/25/2015   Procedure: OPEN REDUCTION INTERNAL FIXATION (ORIF) RIGHT ANKLE ;  Surgeon: Brittney Apley, MD;  Location: [facility];  Service: Orthopedics;  Laterality: Right;   PACEMAKER PLACEMENT     RHINOPLASTY     TONSILLECTOMY     TUBAL LIGATION      Current Medications: Current Meds  Medication Sig   ALPRAZolam (XANAX) 0.5 MG tablet Take 0.25 mg by mouth 2 (two) times daily as needed for anxiety or sleep. 1./-1 tablet 2-3 times a day     amphetamine-dextroamphetamine (ADDERALL) 30 MG tablet Take 30-60 mg by mouth daily.   ARIPiprazole (ABILIFY) 15 MG tablet Take 15 mg by mouth daily.    aspirin EC 81 MG tablet Take 1 tablet (81 mg total) by mouth daily. For heart health   divalproex (DEPAKOTE) 250 MG DR tablet Take 250 mg by mouth. Taking 3 at QHS   DULoxetine (CYMBALTA) 30 MG capsule Take 30 mg by mouth daily. 2 caps at bedtime   fenofibrate (TRICOR) 145 MG tablet Take 145 mg by mouth daily.    folic acid (FOLVITE) 1 MG tablet Take 1 mg by mouth daily.   glipiZIDE (GLUCOTROL XL) 10 MG 24 hr tablet Take 5 mg by mouth daily with breakfast.    magnesium gluconate (MAGONATE) 500 MG tablet Take 500 mg by mouth daily.   metFORMIN (GLUCOPHAGE) 1000 MG tablet Take 1,000 mg by mouth 2 (two) times daily.   Multiple Vitamins-Minerals (CENTROVITE) TABS Take 1 tablet by mouth daily.   omeprazole (PRILOSEC) 40 MG capsule Take 40 mg by mouth 2 (two) times daily.   QUEtiapine (SEROQUEL) 100 MG tablet Take 100 mg by mouth at bedtime. 1-2 tablet s at bedtime   rOPINIRole (REQUIP) 0.5 MG tablet Take 0.5 mg by mouth at bedtime.   thiamine 100 MG tablet Take 1 tablet (100 mg total) by mouth daily. For low thiamine   vitamin E 180 MG (400 UNITS) capsule Take 1 capsule by mouth daily.   [DISCONTINUED] flecainide (TAMBOCOR) 50 MG tablet Take 1 tablet (50 mg total) by mouth 2 (two) times daily.   [DISCONTINUED] hydrALAZINE (APRESOLINE) 25 MG tablet Take 1 tablet (25 mg total) by mouth every 6 (six) hours as needed (if  B/P is greater than 160).   [DISCONTINUED] midodrine (PROAMATINE) 5 MG tablet May take every 6 hours as needed before 1600hrs if B/P less than 110   Current Facility-Administered Medications for the 08/14/20 encounter (Office Visit) with Brittney Si, MD  Medication   0.9 %  sodium chloride infusion     Allergies:   Codeine, Demerol [meperidine], and Metoprolol   Social History   Socioeconomic History    Marital status: Divorced    Spouse name: Not on file   Number of children: Not on file   Years of education: Not on file   Highest education level: Not on file  Occupational History   Occupation: RN  Tobacco Use   Smoking status: Current Every Day Smoker    Packs/day: 1.00    Years: 15.00    Pack years: 15.00    Types: Cigarettes   Smokeless tobacco: Never Used  Building services engineer  Use: Some days  Substance and Sexual Activity   Alcohol use: Yes    Alcohol/week: 0.0 standard drinks    Comment: drinks for couple of days, then no alcohol for days   Drug use: No   Sexual activity: Not Currently    Birth control/protection: Surgical, Abstinence  Other Topics Concern   Not on file  Social History Narrative   Not on file   Social Determinants of Health   Financial Resource Strain:    Difficulty of Paying Living Expenses: Not on file  Food Insecurity:    Worried About Running Out of Food in the Last Year: Not on file   Ran Out of Food in the Last Year: Not on file  Transportation Needs:    Lack of Transportation (Medical): Not on file   Lack of Transportation (Non-Medical): Not on file  Physical Activity:    Days of Exercise per Week: Not on file   Minutes of Exercise per Session: Not on file  Stress:    Feeling of Stress : Not on file  Social Connections:    Frequency of Communication with Friends and Family: Not on file   Frequency of Social Gatherings with Friends and Family: Not on file   Attends Religious Services: Not on file   Active Member of Clubs or Organizations: Not on file   Attends BankerClub or Organization Meetings: Not on file   Marital Status: Not on file     Family History: The patient's family history includes Colon cancer in her paternal grandfather; Colon polyps in her father and mother; Hyperlipidemia in her brother and sister; Hypertension in her brother, father, mother, and sister; Kidney disease in her mother; Kidney failure  in her mother; Lung cancer in her father; Stomach cancer in her maternal grandfather; Stroke in her mother. There is no history of Rectal cancer, Esophageal cancer, or Pancreatic cancer.  ROS:   Please see the history of present illness.    All other systems reviewed and are negative.  EKGs/Labs/Other Studies Reviewed:    EKG:  EKG is not ordered today.    Recent Labs: 05/02/2020: BUN 17; Creatinine, Ser 0.84; Hemoglobin 15.2; Platelets 290; Potassium 3.7; Sodium 138 09/04/2020: TSH 3.990   Recent Lipid Panel    Component Value Date/Time   CHOL 125 09/16/2016 0622   TRIG 171 (H) 09/16/2016 0622   HDL 60 09/16/2016 0622   CHOLHDL 2.1 09/16/2016 0622   VLDL 34 09/16/2016 0622   LDLCALC 31 09/16/2016 0622    Physical Exam:    VS:  BP (!) 142/102    Pulse (!) 138    Ht 5\' 4"  (1.626 m)    Wt 185 lb (83.9 kg)    SpO2 97%    BMI 31.76 kg/m     Wt Readings from Last 3 Encounters:  09/11/20 184 lb (83.5 kg)  09/09/20 185 lb (83.9 kg)  08/14/20 185 lb (83.9 kg)    GENERAL:  Well appearing HEENT: Pupils equal round and reactive, fundi not visualized, oral mucosa unremarkable NECK:  No jugular venous distention, waveform within normal limits, carotid upstroke brisk and symmetric, no bruits, no thyromegaly LYMPHATICS:  No cervical adenopathy LUNGS:  Clear to auscultation bilaterally HEART:  RRR.  PMI not displaced or sustained,S1 and S2 within normal limits, no S3, no S4, no clicks, no rubs, no murmurs ABD:  Flat, positive bowel sounds normal in frequency in pitch, no bruits, no rebound, no guarding, no midline pulsatile mass, no hepatomegaly, no splenomegaly  EXT:  2 plus pulses throughout, no edema, no cyanosis no clubbing SKIN:  No rashes no nodules NEURO:  Cranial nerves II through XII grossly intact, motor grossly intact throughout PSYCH:  Cognitively intact, oriented to person place and time   ASSESSMENT:    1. Medication management   2. Hypertension, unspecified type   3.  Atrial tachycardia (HCC)   4. Complete heart block (HCC)   5. Tobacco use     PLAN:    # Labile hypertension:  Ms. Tamas's BP is very labile.  She has not been taking midodrine because it is mostly high.  She is not tolerating hydralazine.  We Brittney start bisoprolol and doxazosin 1mg  qhs.  She Brittney return to check TSH, renin and aldosterone.  We Brittney also check renal artery Dopplers.  She would like to participate in the PREP program through the [facility].  Continue to track BP regularly.  # Tobacco abuse: Recommend smoking cessation.    Secondary Causes of Hypertension  Medications/Herbal: OCP, steroids, stimulants, antidepressants, weight loss medication, immune suppressants, NSAIDs, sympathomimetics, alcohol, caffeine, licorice, ginseng, St. John's wort, chemo, tobacco Sleep Apnea: negative test 3 years ago Renal artery stenosis: check renal artery Doppler Hyperaldosteronism: check renin/aldo Hyper/hypothyroidism: check TSH Pheochromocytoma: negative 03/2020 Cushing's syndrome: (testing not indicated)  Coarctation of the aorta: (testing not indicated)   Disposition:    FU with MD/PharmD in 1 month    Medication Adjustments/Labs and Tests Ordered: Current medicines are reviewed at length with the patient today.  Concerns regarding medicines are outlined above.  Orders Placed This Encounter  Procedures   Aldosterone + renin activity w/ ratio   TSH   VAS 04/2020 RENAL ARTERY DUPLEX   Meds ordered this encounter  Medications   DISCONTD: doxazosin (CARDURA) 1 MG tablet    Sig: Take 1 tablet (1 mg total) by mouth daily.    Dispense:  90 tablet    Refill:  1    D/C MIDODRINE AND HYDRALAZINE   bisoprolol (ZEBETA) 5 MG tablet    Sig: Take 1 tablet (5 mg total) by mouth daily.    Dispense:  90 tablet    Refill:  1   doxazosin (CARDURA) 1 MG tablet    Sig: Take 1 tablet (1 mg total) by mouth at bedtime.    Dispense:  90 tablet    Refill:  1    D/C MIDODRINE AND HYDRALAZINE D/C  PREVIOUS RX     Signed, [place], MD  09/11/2020 11:38 AM    [facility]

## 2020-08-14 NOTE — Patient Instructions (Addendum)
Medication Instructions:  STOP HYDRALAZINE   STOP MIDODRINE  START BISOPROLOL 5 MG DAILY   START DOXAZOSIN 1 MG AT BEDTIME    Labwork: TSH/RENIN/ALDOSTERONE ONE MORNING SOON   Testing/Procedures: Your physician has requested that you have a renal artery duplex. During this test, an ultrasound is used to evaluate blood flow to the kidneys. Allow one hour for this exam. Do not eat after midnight the day before and avoid carbonated beverages. Take your medications as you usually do.   Follow-Up: 09/16/2020 AT 11:00 AM WITH PHARM D    You will receive a phone call from the PREP exercise and nutrition program to schedule an initial assessment.   Special Instructions:   MONITOR YOUR BLOOD PRESSURE TWICE A DAY, LOG IN BOOK PROVIDED. BRING BOOK AND BLOOD PRESSURE MACHINE TO FOLLOW UP   DASH Eating Plan DASH stands for "Dietary Approaches to Stop Hypertension." The DASH eating plan is a healthy eating plan that has been shown to reduce high blood pressure (hypertension). It may also reduce your risk for type 2 diabetes, heart disease, and stroke. The DASH eating plan may also help with weight loss. What are tips for following this plan?  General guidelines  Avoid eating more than 2,300 mg (milligrams) of salt (sodium) a day. If you have hypertension, you may need to reduce your sodium intake to 1,500 mg a day.  Limit alcohol intake to no more than 1 drink a day for nonpregnant women and 2 drinks a day for men. One drink equals 12 oz of beer, 5 oz of wine, or 1 oz of hard liquor.  Work with your health care provider to maintain a healthy body weight or to lose weight. Ask what an ideal weight is for you.  Get at least 30 minutes of exercise that causes your heart to beat faster (aerobic exercise) most days of the week. Activities may include walking, swimming, or biking.  Work with your health care provider or diet and nutrition specialist (dietitian) to adjust your eating plan to your  individual calorie needs. Reading food labels   Check food labels for the amount of sodium per serving. Choose foods with less than 5 percent of the Daily Value of sodium. Generally, foods with less than 300 mg of sodium per serving fit into this eating plan.  To find whole grains, look for the word "whole" as the first word in the ingredient list. Shopping  Buy products labeled as "low-sodium" or "no salt added."  Buy fresh foods. Avoid canned foods and premade or frozen meals. Cooking  Avoid adding salt when cooking. Use salt-free seasonings or herbs instead of table salt or sea salt. Check with your health care provider or pharmacist before using salt substitutes.  Do not fry foods. Cook foods using healthy methods such as baking, boiling, grilling, and broiling instead.  Cook with heart-healthy oils, such as olive, canola, soybean, or sunflower oil. Meal planning  Eat a balanced diet that includes: ? 5 or more servings of fruits and vegetables each day. At each meal, try to fill half of your plate with fruits and vegetables. ? Up to 6-8 servings of whole grains each day. ? Less than 6 oz of lean meat, poultry, or fish each day. A 3-oz serving of meat is about the same size as a deck of cards. One egg equals 1 oz. ? 2 servings of low-fat dairy each day. ? A serving of nuts, seeds, or beans 5 times each week. ? Heart-healthy  fats. Healthy fats called Omega-3 fatty acids are found in foods such as flaxseeds and coldwater fish, like sardines, salmon, and mackerel.  Limit how much you eat of the following: ? Canned or prepackaged foods. ? Food that is high in trans fat, such as fried foods. ? Food that is high in saturated fat, such as fatty meat. ? Sweets, desserts, sugary drinks, and other foods with added sugar. ? Full-fat dairy products.  Do not salt foods before eating.  Try to eat at least 2 vegetarian meals each week.  Eat more home-cooked food and less restaurant,  buffet, and fast food.  When eating at a restaurant, ask that your food be prepared with less salt or no salt, if possible. What foods are recommended? The items listed may not be a complete list. Talk with your dietitian about what dietary choices are best for you. Grains Whole-grain or whole-wheat bread. Whole-grain or whole-wheat pasta. Brown rice. Orpah Cobb. Bulgur. Whole-grain and low-sodium cereals. Pita bread. Low-fat, low-sodium crackers. Whole-wheat flour tortillas. Vegetables Fresh or frozen vegetables (raw, steamed, roasted, or grilled). Low-sodium or reduced-sodium tomato and vegetable juice. Low-sodium or reduced-sodium tomato sauce and tomato paste. Low-sodium or reduced-sodium canned vegetables. Fruits All fresh, dried, or frozen fruit. Canned fruit in natural juice (without added sugar). Meat and other protein foods Skinless chicken or [place]. Ground chicken or [place]. Pork with fat trimmed off. Fish and seafood. Egg whites. Dried beans, peas, or lentils. Unsalted nuts, nut butters, and seeds. Unsalted canned beans. Lean cuts of beef with fat trimmed off. Low-sodium, lean deli meat. Dairy Low-fat (1%) or fat-free (skim) milk. Fat-free, low-fat, or reduced-fat cheeses. Nonfat, low-sodium ricotta or cottage cheese. Low-fat or nonfat yogurt. Low-fat, low-sodium cheese. Fats and oils Soft margarine without trans fats. Vegetable oil. Low-fat, reduced-fat, or light mayonnaise and salad dressings (reduced-sodium). Canola, safflower, olive, soybean, and sunflower oils. Avocado. Seasoning and other foods Herbs. Spices. Seasoning mixes without salt. Unsalted popcorn and pretzels. Fat-free sweets. What foods are not recommended? The items listed may not be a complete list. Talk with your dietitian about what dietary choices are best for you. Grains Baked goods made with fat, such as croissants, muffins, or some breads. Dry pasta or rice meal packs. Vegetables Creamed or fried  vegetables. Vegetables in a cheese sauce. Regular canned vegetables (not low-sodium or reduced-sodium). Regular canned tomato sauce and paste (not low-sodium or reduced-sodium). Regular tomato and vegetable juice (not low-sodium or reduced-sodium). Rosita Fire. Olives. Fruits Canned fruit in a light or heavy syrup. Fried fruit. Fruit in cream or butter sauce. Meat and other protein foods Fatty cuts of meat. Ribs. Fried meat. Tomasa Blase. Sausage. Bologna and other processed lunch meats. Salami. Fatback. Hotdogs. Bratwurst. Salted nuts and seeds. Canned beans with added salt. Canned or smoked fish. Whole eggs or egg yolks. Chicken or [place] with skin. Dairy Whole or 2% milk, cream, and half-and-half. Whole or full-fat cream cheese. Whole-fat or sweetened yogurt. Full-fat cheese. Nondairy creamers. Whipped toppings. Processed cheese and cheese spreads. Fats and oils Butter. Stick margarine. Lard. Shortening. Ghee. Bacon fat. Tropical oils, such as coconut, palm kernel, or palm oil. Seasoning and other foods Salted popcorn and pretzels. Onion salt, garlic salt, seasoned salt, table salt, and sea salt. Worcestershire sauce. Tartar sauce. Barbecue sauce. Teriyaki sauce. Soy sauce, including reduced-sodium. Steak sauce. Canned and packaged gravies. Fish sauce. Oyster sauce. Cocktail sauce. Horseradish that you find on the shelf. Ketchup. Mustard. Meat flavorings and tenderizers. Bouillon cubes. Hot sauce and Tabasco sauce. Premade  or packaged marinades. Premade or packaged taco seasonings. Relishes. Regular salad dressings. Where to find more information:  National Heart, Lung, and Blood Institute: PopSteam.is  American Heart Association: www.heart.org Summary  The DASH eating plan is a healthy eating plan that has been shown to reduce high blood pressure (hypertension). It may also reduce your risk for type 2 diabetes, heart disease, and stroke.  With the DASH eating plan, you should limit salt (sodium)  intake to 2,300 mg a day. If you have hypertension, you may need to reduce your sodium intake to 1,500 mg a day.  When on the DASH eating plan, aim to eat more fresh fruits and vegetables, whole grains, lean proteins, low-fat dairy, and heart-healthy fats.  Work with your health care provider or diet and nutrition specialist (dietitian) to adjust your eating plan to your individual calorie needs. This information is not intended to replace advice given to you by your health care provider. Make sure you discuss any questions you have with your health care provider. Document Released: 12/02/2011 Document Revised: 11/25/2017 Document Reviewed: 12/06/2016 Elsevier Patient Education  2020 [employer].

## 2020-08-15 ENCOUNTER — Telehealth: Payer: Self-pay

## 2020-08-15 NOTE — Telephone Encounter (Signed)
Call placed to pt reference referral to PREP Explained program Interested can do a daytime class  Advised will call back with start date of next class, and to schedule intake appt.

## 2020-08-22 ENCOUNTER — Telehealth: Payer: Self-pay

## 2020-08-22 NOTE — Telephone Encounter (Signed)
Called reference next start date for PREP class on 09/09/20 confirmed is able to do the T/TH 1p-215p schedule at [facility].  Will call back closer to start to conduct intake.

## 2020-09-04 ENCOUNTER — Ambulatory Visit (HOSPITAL_COMMUNITY)
Admission: RE | Admit: 2020-09-04 | Discharge: 2020-09-04 | Disposition: A | Discharge status: Home / Self Care | Payer: Medicare Other | Source: Ambulatory Visit | Attending: Cardiovascular Disease | Admitting: Cardiovascular Disease

## 2020-09-04 ENCOUNTER — Other Ambulatory Visit: Payer: Self-pay

## 2020-09-04 DIAGNOSIS — I10 Essential (primary) hypertension: Secondary | ICD-10-CM

## 2020-09-09 LAB — TSH: TSH: 3.99 u[IU]/mL (ref 0.450–4.500)

## 2020-09-09 LAB — ALDOSTERONE + RENIN ACTIVITY W/ RATIO
ALDOS/RENIN RATIO: 20.5 (ref 0.0–30.0)
ALDOSTERONE: 7.8 ng/dL (ref 0.0–30.0)
Renin: 0.381 ng/mL/hr (ref 0.167–5.380)

## 2020-09-10 NOTE — Progress Notes (Signed)
[facility] PREP Progress Report   Patient Details  Name: Brittney Cooper MRN: [id number] Date of Birth: 09/30/1961 Age: 59 y.o. PCP: Cheron Schaumann., MD  Vitals:   09/09/20 1600  BP: (!) 138/98  Pulse: (!) 110  SpO2: 97%  Weight: 185 lb (83.9 kg)  Height: 5\' 4"  (1.626 m)      [facility] Eval - 09/10/20 0900      Referral    Referring Provider HTN clinic     Reason for referral Current Smoker;Hypertension;Inactivity      Measurement   Waist Circumference 44.5 inches    Hip Circumference 47 inches    Body fat 40.6 percent      Information for Trainer   Goals Weight loss: goal weight 150 lbs    Current Exercise none    Orthopedic Concerns low back pain, past right ankle surgery    Pertinent Medical History HTN, Cardiac arrests x 2, pacer, CHB, dysautomnia, DM2, high chol    Restrictions/Precautions Caregiver required;Diabetic snack before exercise;Fall risk   service animal to alert heart rhythm disturbance    Medications that affect exercise Beta blocker;Medication causing dizziness/drowsiness      Timed Up and Go (TUGS)   Timed Up and Go Low risk <9 seconds      Mobility and Daily Activities   I find it easy to walk up or down two or more flights of stairs. 1    I have no trouble taking out the trash. 1    I do housework such as vacuuming and dusting on my own without difficulty. 1    I can easily lift a gallon of milk (8lbs). 4    I can easily walk a mile. 1    I have no trouble reaching into high cupboards or reaching down to pick up something from the floor. 1    I do not have trouble doing out-door work such as 09/12/20, raking leaves, or gardening. 1      Mobility and Daily Activities   I feel younger than my age. 1    I feel independent. 3    I feel energetic. 2    I live an active life.  1    I feel strong. 1    I feel healthy. 1    I feel active as other people my age. 1      How fit and strong are you.   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, working on OB floor as a nurse   Syncope    neurocardiogenic   Third degree heart block (HCC)    Past Surgical History:  Procedure Laterality Date   ABDOMINAL HYSTERECTOMY  ANTERIOR AND POSTERIOR REPAIR     BUNIONECTOMY Bilateral    CESAREAN SECTION     EP IMPLANTABLE DEVICE N/A 10/12/2016   Procedure: Pacemaker Implant;  Surgeon: Will Jorja Loa, MD;  [facility];  Service: Cardiovascular;  Laterality: N/A;   finfger surgery     FINGER SURGERY     LTCS     MYRINGOTOMY     ORIF ANKLE FRACTURE Right 07/25/2015   Procedure: OPEN REDUCTION INTERNAL FIXATION (ORIF) RIGHT ANKLE ;  Surgeon: Sheral Apley, MD;  Location: [facility];  Service: Orthopedics;  Laterality: Right;   PACEMAKER PLACEMENT     RHINOPLASTY     TONSILLECTOMY     TUBAL LIGATION     Social History   Tobacco Use  Smoking Status Current Every Day Smoker   Packs/day: 1.00   Years: 15.00   Pack years: 15.00   Types: Cigarettes  Smokeless Tobacco Never Used    Class start 09/09/20 x 12 wks T/TH 1p-215pm  Pam M Gustavus Haskin 09/10/2020, 9:23 AM

## 2020-09-11 ENCOUNTER — Ambulatory Visit (INDEPENDENT_AMBULATORY_CARE_PROVIDER_SITE_OTHER): Payer: Medicare Other | Admitting: Cardiovascular Disease

## 2020-09-11 ENCOUNTER — Encounter: Payer: Self-pay | Admitting: Cardiovascular Disease

## 2020-09-11 ENCOUNTER — Other Ambulatory Visit: Payer: Self-pay

## 2020-09-11 VITALS — BP 126/100 | HR 90 | Ht 64.0 in | Wt 184.0 lb

## 2020-09-11 DIAGNOSIS — I10 Essential (primary) hypertension: Secondary | ICD-10-CM

## 2020-09-11 DIAGNOSIS — G901 Familial dysautonomia [Riley-Day]: Secondary | ICD-10-CM

## 2020-09-11 DIAGNOSIS — Z720 Tobacco use: Secondary | ICD-10-CM

## 2020-09-11 DIAGNOSIS — I442 Atrioventricular block, complete: Secondary | ICD-10-CM

## 2020-09-11 DIAGNOSIS — I471 Supraventricular tachycardia: Secondary | ICD-10-CM

## 2020-09-11 DIAGNOSIS — Z5181 Encounter for therapeutic drug level monitoring: Secondary | ICD-10-CM

## 2020-09-11 DIAGNOSIS — I4719 Other supraventricular tachycardia: Secondary | ICD-10-CM

## 2020-09-11 MED ORDER — LOSARTAN POTASSIUM 25 MG PO TABS
25.0000 mg | ORAL_TABLET | Freq: Every day | ORAL | 3 refills | Status: DC
Start: 2020-09-11 — End: 2021-04-21

## 2020-09-11 NOTE — Progress Notes (Signed)
Hypertension Clinic Initial Assessment:    Date:  09/11/2020   ID:  Brittney Cooper, DOB 1961/11/08, MRN [id number]  PCP:  Cheron Schaumann., MD  Cardiologist:  No primary care provider on file.   Referring MD: Cheron Schaumann.,*   CC: Hypertension  History of Present Illness:    Brittney Cooper is a 59 y.o. female with a hx of hypertension and dysautonomia and syncope s/p PPM, pulmonary hypertension, atrial tachycardia, EtOH related pancreatitis here to establish care in the hypertension clinic. She has a couple falls and syncope.  She wore an ambulatory monitor 11/2019 when she had a syncopal event and was noted to be in sinus rhythm.  In 2016 she had an episode of syncope and was found to have intermittent CHB.  This was associated with 6-second pauses.  She had a Biotronik pacemaker implanted.  She also saw Dr. [name] at [facility] and trazodone was discontinued with some improvement in her dizziness and no additional episodes of syncope.  For the last 3 years she has had very labile hypertension.  She has been taking midodrine and hydralazine as needed.  However she has not needed the midodrine lately because her blood pressure has been so high.  Her blood pressure lately has been extremely high, as high as the 200s over 100s.  Lately she noticed that when her blood pressure was in a more normal range she feels lightheaded.  When this happens she goes to [facility] [employer].  It seems as though her blood pressure changes without reason.  It is associated with some palpitations.  Ms. Piltz notes that she is unable to exercise because of shortness of breath.  She feels like her dysautonomia is flaring lately.  She mostly eats at home.  She eats applesauce, yogurt or milk only because of GI upset.  She has very limited salt intake.  She does report orthopnea but no lower extremity edema or PND.  She drinks 1 can of soda daily.  She has been on Adderall since her 62s and hasn't noted  any change when it was held.  She stopped taking Cymbalta a few months ago and this does not seem to have helped.  She continues to smoke 1 pack of cigarettes daily.  She was able to quit when pregnant.  At her last appointment hydralazine and midodrine were both discontinued.  She was started on bisoprolol and doxazosin.  This seems to be helping.  At home her blood pressures have been in the 150s to 180s over 110s.  She has not had any more blood pressures in the 200s and has not had any very low numbers.  She also denies any lightheadedness or dizziness.  She started at the PREP program through the [facility] and likes it a lot.  She had lab testing that was negative for both pheochromocytoma and hyperaldosteronism.  Thyroid function was normal.  Renal artery Dopplers were within normal limits 08/2020.  Previous antihypertensives: Metoprolol- nightmares Hydralazine  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   Allergy    Anxiety    Arthritis    Bipolar disorder (HCC)    Cardiac arrest (HCC) 06-2015, 08-2016   x3- pt has pacemaker    Chronic pain    back   Depression    Fatty liver    Fracture of lateral malleolus of right fibula  Hepatomegaly    Hyperlipidemia    Hypertension    OCD (obsessive compulsive disorder)    Pancreatic pseudocyst    Panic attack    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, working on OB floor as a nurse   Syncope    neurocardiogenic   Third degree heart block (HCC)     Past Surgical History:  Procedure Laterality Date   ABDOMINAL HYSTERECTOMY     ANTERIOR AND POSTERIOR REPAIR     BUNIONECTOMY Bilateral    CESAREAN SECTION     EP IMPLANTABLE DEVICE N/A 10/12/2016   Procedure: Pacemaker Implant;  Surgeon: Will Jorja Loa, MD;  [facility]: [facility];  Service: Cardiovascular;   Laterality: N/A;   finfger surgery     FINGER SURGERY     LTCS     MYRINGOTOMY     ORIF ANKLE FRACTURE Right 07/25/2015   Procedure: OPEN REDUCTION INTERNAL FIXATION (ORIF) RIGHT ANKLE ;  Surgeon: Sheral Apley, MD;  Location: [facility];  Service: Orthopedics;  Laterality: Right;   PACEMAKER PLACEMENT     RHINOPLASTY     TONSILLECTOMY     TUBAL LIGATION      Current Medications: Current Meds  Medication Sig   ALPRAZolam (XANAX) 0.5 MG tablet Take 0.25 mg by mouth 2 (two) times daily as needed for anxiety or sleep. 1./-1 tablet 2-3 times a day    amphetamine-dextroamphetamine (ADDERALL) 30 MG tablet Take 30-60 mg by mouth daily.   ARIPiprazole (ABILIFY) 15 MG tablet Take 15 mg by mouth daily.    aspirin EC 81 MG tablet Take 1 tablet (81 mg total) by mouth daily. For heart health   bisoprolol (ZEBETA) 5 MG tablet Take 1 tablet (5 mg total) by mouth daily.   divalproex (DEPAKOTE) 250 MG DR tablet Take 250 mg by mouth. Taking 3 at QHS   doxazosin (CARDURA) 1 MG tablet Take 1 tablet (1 mg total) by mouth at bedtime.   DULoxetine (CYMBALTA) 30 MG capsule Take 30 mg by mouth daily. 2 caps at bedtime   fenofibrate (TRICOR) 145 MG tablet Take 145 mg by mouth daily.    flecainide (TAMBOCOR) 50 MG tablet Take 50 mg by mouth once. Pt taking half a tablet daily.   folic acid (FOLVITE) 1 MG tablet Take 1 mg by mouth daily.   glipiZIDE (GLUCOTROL XL) 10 MG 24 hr tablet Take 5 mg by mouth daily with breakfast.    magnesium gluconate (MAGONATE) 500 MG tablet Take 500 mg by mouth daily.   metFORMIN (GLUCOPHAGE) 1000 MG tablet Take 1,000 mg by mouth 2 (two) times daily.   Multiple Vitamins-Minerals (CENTROVITE) TABS Take 1 tablet by mouth daily.   omeprazole (PRILOSEC) 40 MG capsule Take 40 mg by mouth 2 (two) times daily.   QUEtiapine (SEROQUEL) 100 MG tablet Take 100 mg by mouth at bedtime. 1-2 tablet s at bedtime   rOPINIRole (REQUIP) 0.5 MG tablet Take  0.5 mg by mouth at bedtime.   thiamine 100 MG tablet Take 1 tablet (100 mg total) by mouth daily. For low thiamine   vitamin E 180 MG (400 UNITS) capsule Take 1 capsule by mouth daily.   Current Facility-Administered Medications for the 09/11/20 encounter (Office Visit) with Chilton Si, MD  Medication   0.9 %  sodium chloride infusion     Allergies:   Codeine, Demerol [meperidine], and Metoprolol   Social History   Socioeconomic History   Marital  status: Divorced    Spouse name: Not on file   Number of children: Not on file   Years of education: Not on file   Highest education level: Not on file  Occupational History   Occupation: RN  Tobacco Use   Smoking status: Current Every Day Smoker    Packs/day: 1.00    Years: 15.00    Pack years: 15.00    Types: Cigarettes   Smokeless tobacco: Never Used  Building services engineerVaping Use   Vaping Use: Some days  Substance and Sexual Activity   Alcohol use: Yes    Alcohol/week: 0.0 standard drinks    Comment: drinks for couple of days, then no alcohol for days   Drug use: No   Sexual activity: Not Currently    Birth control/protection: Surgical, Abstinence  Other Topics Concern   Not on file  Social History Narrative   Not on file   Social Determinants of Health   Financial Resource Strain:    Difficulty of Paying Living Expenses: Not on file  Food Insecurity:    Worried About Running Out of Food in the Last Year: Not on file   Ran Out of Food in the Last Year: Not on file  Transportation Needs:    Lack of Transportation (Medical): Not on file   Lack of Transportation (Non-Medical): Not on file  Physical Activity:    Days of Exercise per Week: Not on file   Minutes of Exercise per Session: Not on file  Stress:    Feeling of Stress : Not on file  Social Connections:    Frequency of Communication with Friends and Family: Not on file   Frequency of Social Gatherings with Friends and Family: Not on file    Attends Religious Services: Not on file   Active Member of Clubs or Organizations: Not on file   Attends BankerClub or Organization Meetings: Not on file   Marital Status: Not on file     Family History: The patient's family history includes Colon cancer in her paternal grandfather; Colon polyps in her father and mother; Hyperlipidemia in her brother and sister; Hypertension in her brother, father, mother, and sister; Kidney disease in her mother; Kidney failure in her mother; Lung cancer in her father; Stomach cancer in her maternal grandfather; Stroke in her mother. There is no history of Rectal cancer, Esophageal cancer, or Pancreatic cancer.  ROS:   Please see the history of present illness.    All other systems reviewed and are negative.  EKGs/Labs/Other Studies Reviewed:    EKG:  EKG is not ordered today.   Recent Labs: 05/02/2020: BUN 17; Creatinine, Ser 0.84; Hemoglobin 15.2; Platelets 290; Potassium 3.7; Sodium 138 09/04/2020: TSH 3.990   Recent Lipid Panel    Component Value Date/Time   CHOL 125 09/16/2016 0622   TRIG 171 (H) 09/16/2016 0622   HDL 60 09/16/2016 0622   CHOLHDL 2.1 09/16/2016 0622   VLDL 34 09/16/2016 0622   LDLCALC 31 09/16/2016 0622    Physical Exam:    VS:  BP (!) 126/100    Pulse 90    Ht 5\' 4"  (1.626 m)    Wt 184 lb (83.5 kg)    SpO2 97%    BMI 31.58 kg/m     Wt Readings from Last 3 Encounters:  09/11/20 184 lb (83.5 kg)  09/09/20 185 lb (83.9 kg)  08/14/20 185 lb (83.9 kg)    GENERAL:  Well appearing HEENT: Pupils equal round and reactive, fundi not  visualized, oral mucosa unremarkable NECK:  No jugular venous distention, waveform within normal limits, carotid upstroke brisk and symmetric, no bruits, no thyromegaly LYMPHATICS:  No cervical adenopathy LUNGS:  Clear to auscultation bilaterally HEART:  RRR.  PMI not displaced or sustained,S1 and S2 within normal limits, no S3, no S4, no clicks, no rubs, no murmurs ABD:  Flat, positive bowel sounds  normal in frequency in pitch, no bruits, no rebound, no guarding, no midline pulsatile mass, no hepatomegaly, no splenomegaly EXT:  2 plus pulses throughout, no edema, no cyanosis no clubbing SKIN:  No rashes no nodules NEURO:  Cranial nerves II through XII grossly intact, motor grossly intact throughout PSYCH:  Cognitively intact, oriented to person place and time   ASSESSMENT:    1. Therapeutic drug monitoring   2. Essential hypertension   3. Atrial tachycardia (HCC)   4. Complete heart block (HCC)   5. Dysautonomia (HCC)   6. Tobacco use     PLAN:    # Labile hypertension:  Ms. Badami's BP has been much less labile on this regimen.  However her blood pressure remains elevated.  We will start losartan 25 mg daily.  She will get a basic metabolic panel in a week.  Continue bisoprolol and doxazosin.  Continue to participate in the PREP program.  # Tobacco abuse: Recommend cessation.   Secondary Causes of Hypertension  Medications/Herbal: OCP, steroids, stimulants, antidepressants, weight loss medication, immune suppressants, NSAIDs, sympathomimetics, alcohol, caffeine, licorice, ginseng, St. John's wort, chemo, tobacco Sleep Apnea: negative test 3 years ago Renal artery stenosis: negative 08/2020 Hyperaldosteronism: negative 2021 Hyper/hypothyroidism: Negative 2021 Pheochromocytoma: negative 2021 Cushing's syndrome: (testing not indicated)  Coarctation of the aorta: (testing not indicated)   Disposition:    FU with MD/PharmD in 1 month   Medication Adjustments/Labs and Tests Ordered: Current medicines are reviewed at length with the patient today.  Concerns regarding medicines are outlined above.  Orders Placed This Encounter  Procedures   Basic metabolic panel   Meds ordered this encounter  Medications   losartan (COZAAR) 25 MG tablet    Sig: Take 1 tablet (25 mg total) by mouth daily.    Dispense:  90 tablet    Refill:  3     Signed, Chilton Si, MD    09/11/2020 11:27 AM    [facility]

## 2020-09-11 NOTE — Patient Instructions (Addendum)
Medication Instructions:  START LOSARTAN 25 MG DAILY   Labwork: BMET 1 WEEK   Testing/Procedures: NONE  Follow-Up: 1 MONTH WITH PHARM D 10/14/2020 AT 10:00 AM

## 2020-09-16 ENCOUNTER — Ambulatory Visit: Payer: Medicare Other

## 2020-09-29 ENCOUNTER — Telehealth: Payer: Self-pay | Admitting: Emergency Medicine

## 2020-09-29 NOTE — Telephone Encounter (Signed)
Biotronik alert that no connection with monitor for 21 days. Patient will check connections with monitor.

## 2020-09-30 NOTE — Progress Notes (Signed)
Received text requesting to take a future PREP class due to schedule conflicts.  Requested pt notify me when she is ready to re-enroll.

## 2020-09-30 NOTE — Telephone Encounter (Signed)
Monitor is now connected as of 09/30/20.

## 2020-10-08 ENCOUNTER — Ambulatory Visit (INDEPENDENT_AMBULATORY_CARE_PROVIDER_SITE_OTHER): Payer: Medicare Other

## 2020-10-08 DIAGNOSIS — R55 Syncope and collapse: Secondary | ICD-10-CM | POA: Diagnosis not present

## 2020-10-10 LAB — CUP PACEART REMOTE DEVICE CHECK
Date Time Interrogation Session: 20211013080946
Implantable Lead Implant Date: 20171017
Implantable Lead Implant Date: 20171017
Implantable Lead Location: 753859
Implantable Lead Location: 753860
Implantable Lead Model: 377
Implantable Lead Model: 377
Implantable Lead Serial Number: 49593512
Implantable Lead Serial Number: 49620910
Implantable Pulse Generator Implant Date: 20171017
Pulse Gen Model: 394969
Pulse Gen Serial Number: 68784192

## 2020-10-13 NOTE — Progress Notes (Signed)
Remote pacemaker transmission.   

## 2020-10-14 ENCOUNTER — Ambulatory Visit: Payer: Medicare Other

## 2020-10-14 NOTE — Progress Notes (Deleted)
Patient ID: Brittney Cooper                 DOB: 10/02/1961                      MRN: [id number]     HPI:  Brittney Cooper is a 59 y.o. female referred by Dr. Duke Salvia to HTN clinic. History of hypertension, dysautonomia and syncope s/p PPM, pulmonary hypertension, atrial tachycardia, EtOH related pancreatitis here to establish care in the hypertension clinic.   Current HTN meds:  Bisoprolol 5mg  daily Doxazosin 1mg  daily Losartan 25mg  daily - added by Dr on 09/11/2020  Previously tried:   BP goal: 130/80  Family History: The patient's family history includes Colon cancer in her paternal grandfather; Colon polyps in her father and mother; Hyperlipidemia in her brother and sister; Hypertension in her brother, father, mother, and sister; Kidney disease in her mother; Kidney failure in her mother; Lung cancer in her father; Stomach cancer in her maternal grandfather; Stroke in her mother. There is no history of Rectal cancer, Esophageal cancer, or Pancreatic cancer.  Social History:  Current smoker(cigatettes), no smokeless tobacco, occasional vaping,   Diet:   Exercise:   Home BP readings:   Wt Readings from Last 3 Encounters:  09/11/20 184 lb (83.5 kg)  09/09/20 185 lb (83.9 kg)  08/14/20 185 lb (83.9 kg)   BP Readings from Last 3 Encounters:  09/11/20 (!) 126/100  09/09/20 (!) 138/98  08/14/20 (!) 142/102   Pulse Readings from Last 3 Encounters:  09/11/20 90  09/09/20 (!) 110  08/14/20 (!) 138    Renal function: CrCl cannot be calculated (Patient's most recent lab result is older than the maximum 21 days allowed.).  Past Medical History:  Diagnosis Date  . ADHD (attention deficit hyperactivity disorder)   . Alcohol abuse   . 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 Allergy   . Anxiety   . Arthritis   . Bipolar disorder (HCC)   . Cardiac arrest (HCC) 06-2015, 08-2016   x3- pt has pacemaker   . Chronic pain    back  . Depression   . Fatty liver   .  Fracture of lateral malleolus of right fibula   . Hepatomegaly   . Hyperlipidemia   . Hypertension   . OCD (obsessive compulsive disorder)   . Pancreatic pseudocyst   . Panic attack   . 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 Syncope    neurocardiogenic  . Third degree heart block (HCC)     Current Outpatient Medications on File Prior to Visit  Medication Sig Dispense Refill  . ALPRAZolam (XANAX) 0.5 MG tablet Take 0.25 mg by mouth 2 (two) times daily as needed for anxiety or sleep. 1./-1 tablet 2-3 times a day     . amphetamine-dextroamphetamine (ADDERALL) 30 MG tablet Take 30-60 mg by mouth daily.    . ARIPiprazole (ABILIFY) 15 MG tablet Take 15 mg by mouth daily.     08-19-2000 aspirin EC 81 MG tablet Take 1 tablet (81 mg total) by mouth daily. For heart health    . bisoprolol (ZEBETA) 5 MG tablet Take 1 tablet (5 mg total) by mouth daily. 90 tablet 1  . divalproex (DEPAKOTE) 250 MG DR tablet Take 250 mg by mouth. Taking 3 at QHS    . doxazosin (CARDURA) 1 MG tablet Take 1 tablet (  1 mg total) by mouth at bedtime. 90 tablet 1  . DULoxetine (CYMBALTA) 30 MG capsule Take 30 mg by mouth daily. 2 caps at bedtime    . fenofibrate (TRICOR) 145 MG tablet Take 145 mg by mouth daily.     . flecainide (TAMBOCOR) 50 MG tablet Take 50 mg by mouth once. Pt taking half a tablet daily.    . folic acid (FOLVITE) 1 MG tablet Take 1 mg by mouth daily.    Marland Kitchen glipiZIDE (GLUCOTROL XL) 10 MG 24 hr tablet Take 5 mg by mouth daily with breakfast.     . losartan (COZAAR) 25 MG tablet Take 1 tablet (25 mg total) by mouth daily. 90 tablet 3  . magnesium gluconate (MAGONATE) 500 MG tablet Take 500 mg by mouth daily.    . metFORMIN (GLUCOPHAGE) 1000 MG tablet Take 1,000 mg by mouth 2 (two) times daily.    . Multiple Vitamins-Minerals (CENTROVITE) TABS Take 1 tablet by mouth daily.    Marland Kitchen omeprazole (PRILOSEC) 40 MG capsule Take 40 mg by mouth 2 (two)  times daily.    . QUEtiapine (SEROQUEL) 100 MG tablet Take 100 mg by mouth at bedtime. 1-2 tablet s at bedtime    . rOPINIRole (REQUIP) 0.5 MG tablet Take 0.5 mg by mouth at bedtime.    . thiamine 100 MG tablet Take 1 tablet (100 mg total) by mouth daily. For low thiamine 1 tablet 0  . vitamin E 180 MG (400 UNITS) capsule Take 1 capsule by mouth daily.     Current Facility-Administered Medications on File Prior to Visit  Medication Dose Route Frequency Provider Last Rate Last Admin  . 0.9 %  sodium chloride infusion  500 mL Intravenous Continuous Nandigam, Eleonore Chiquito, MD        Allergies  Allergen Reactions  . Codeine Nausea And Vomiting  . Demerol [Meperidine] Other (See Comments)    Hallucinations   . Metoprolol Other (See Comments)    nightmares    There were no vitals taken for this visit.  No problem-specific Assessment & Plan notes found for this encounter.    Bridgett Hattabaugh Rodriguez-Guzman PharmD, BCPS, CPP [facility] [address] [place] [address] 10/14/2020 9:49 AM

## 2020-10-28 ENCOUNTER — Emergency Department (HOSPITAL_COMMUNITY)
Admission: EM | Admit: 2020-10-28 | Discharge: 2020-10-29 | Disposition: A | Discharge status: Home / Self Care | Payer: Medicare Other | Attending: Emergency Medicine | Admitting: Emergency Medicine

## 2020-10-28 ENCOUNTER — Encounter (HOSPITAL_COMMUNITY): Payer: Self-pay | Admitting: Emergency Medicine

## 2020-10-28 ENCOUNTER — Other Ambulatory Visit: Payer: Self-pay

## 2020-10-28 DIAGNOSIS — R079 Chest pain, unspecified: Secondary | ICD-10-CM | POA: Diagnosis not present

## 2020-10-28 DIAGNOSIS — Z5321 Procedure and treatment not carried out due to patient leaving prior to being seen by health care provider: Secondary | ICD-10-CM | POA: Diagnosis not present

## 2020-10-28 DIAGNOSIS — R0602 Shortness of breath: Secondary | ICD-10-CM | POA: Insufficient documentation

## 2020-10-28 HISTORY — DX: Type 2 diabetes mellitus without complications: E11.9

## 2020-10-28 NOTE — ED Triage Notes (Signed)
Patient arrived with EMS from home reports central chest pain with SOB this evening , denies emesis or diaphoresis , no cough or fever , her cardiologist is Dr. Graciela Husbands .

## 2020-10-29 ENCOUNTER — Emergency Department (HOSPITAL_COMMUNITY): Payer: Medicare Other

## 2020-10-29 LAB — I-STAT BETA HCG BLOOD, ED (MC, WL, AP ONLY): I-stat hCG, quantitative: <5 m[IU]/mL (ref <5)

## 2020-10-29 LAB — CBC
HCT: 42.5 % (ref 36.0–46.0)
Hemoglobin: 14.2 g/dL (ref 12.0–15.0)
MCH: 32.6 pg (ref 26.0–34.0)
MCHC: 33.4 g/dL (ref 30.0–36.0)
MCV: 97.7 fL (ref 80.0–100.0)
Platelets: 254 10*3/uL (ref 150–400)
RBC: 4.35 MIL/uL (ref 3.87–5.11)
RDW: 12.8 % (ref 11.5–15.5)
WBC: 5.5 10*3/uL (ref 4.0–10.5)
nRBC: 0 % (ref 0.0–0.2)

## 2020-10-29 LAB — BASIC METABOLIC PANEL
Anion gap: 14 (ref 5–15)
BUN: 13 mg/dL (ref 6–20)
CO2: 22 mmol/L (ref 22–32)
Calcium: 9.2 mg/dL (ref 8.9–10.3)
Chloride: 103 mmol/L (ref 98–111)
Creatinine, Ser: 0.72 mg/dL (ref 0.44–1.00)
GFR, Estimated: >60 mL/min (ref >60)
Glucose, Bld: 188 mg/dL — ABNORMAL HIGH (ref 70–99)
Potassium: 3.1 mmol/L — ABNORMAL LOW (ref 3.5–5.1)
Sodium: 139 mmol/L (ref 135–145)

## 2020-10-29 LAB — PROTIME-INR
INR: 1.1 (ref 0.8–1.2)
Prothrombin Time: 13.5 seconds (ref 11.4–15.2)

## 2020-10-29 LAB — TROPONIN I (HIGH SENSITIVITY): Troponin I (High Sensitivity): 5 ng/L (ref <18)

## 2020-10-29 NOTE — ED Notes (Signed)
Pt called 2x no response  

## 2020-10-29 NOTE — ED Notes (Signed)
Pt called 3 times no response  

## 2020-11-05 ENCOUNTER — Telehealth: Payer: Self-pay | Admitting: Cardiovascular Disease

## 2020-11-05 DIAGNOSIS — Z79899 Other long term (current) drug therapy: Secondary | ICD-10-CM

## 2020-11-05 DIAGNOSIS — I442 Atrioventricular block, complete: Secondary | ICD-10-CM

## 2020-11-05 NOTE — Telephone Encounter (Signed)
Patient coming to see pharmacist for HTN management. Okay to order Lipid blood work, not done in > 1 year.

## 2020-11-05 NOTE — Telephone Encounter (Signed)
I will send to PharmD to see what labs need to be drawn - anything else other than fasting lipids?

## 2020-11-05 NOTE — Telephone Encounter (Signed)
Patient is wanting a lab order put in to check her cholesterol prior to her appt tomorrow with the pharmacist. Please advise.

## 2020-11-06 ENCOUNTER — Ambulatory Visit (INDEPENDENT_AMBULATORY_CARE_PROVIDER_SITE_OTHER): Payer: Medicare Other | Admitting: Pharmacist Clinician (PhC)/ Clinical Pharmacy Specialist

## 2020-11-06 DIAGNOSIS — I10 Essential (primary) hypertension: Secondary | ICD-10-CM

## 2020-11-06 NOTE — Progress Notes (Signed)
11/10/2020 MICHAELEEN DOWN 1961-02-16 [id number]   HPI:  Brittney Cooper is a 59 y.o. female patient of Dr Duke Salvia, with a PMH below who presents today for advanced hypertension clinic follow up.  She was seen by Dr. Duke Salvia in September, at which time her blood pressure was 126/100.  She was prescribed losartan 25 mg daily (lower dose due to labile nature of her pressure) and asked to repeat metabolic panel in 2 weeks.    She returns today for follow up.  She did not have blood work done for losartan, as she did not actually start the medication.  She has been feeling overwhelmed by her medications lately, and stopped most of them, with the exception of Adderall.  She has her medications with her today, and many are in zip lock baggies with a name written in [place].  States she still has issues with syncope 2-3 times per week, but has learned "how to fall" to avoid injury, and also has a service dog to help.  She is no longer using compression stockings.    She does have problems with hypokalemia at times.  Aldosterone/renin labs tested were WNL  Past Medical History: DM2 Now at 5.9 down from 8.4 three years ago - on metformin and glipizide  dysautonomia Neurogenic orthostatic hypotension, was on midodrine, not used for several months  ADHD On adderall 30-60 mg qd  Complete heart block Better since pacemaker implanted, previously was having 6 second pauses and syncope     Blood Pressure Goal:  130/80  Current Medications: bisoprolol 5 gm qd, doxazosin 1 mg qhs, losartan 25 mg qd,   Family Hx: mother dementia, father lung cancer  Social Hx: smokes 1 ppd; (would like to quit, but not at this point); no regular alcohol, caffeine 2 12 oz pepsi per day  Exercise: no regular exercise  Home BP readings: 11 readings over past month - average 153/117 with HR 108  Intolerances: no cardiovascular medication intolerances  Labs: 10/29/20; Na 139, K 3.1, Glu 188, BUN 13, SCr 0.72   Wt  Readings from Last 3 Encounters:  11/06/20 188 lb (85.3 kg)  10/28/20 209 lb 7 oz (95 kg)  09/11/20 184 lb (83.5 kg)   BP Readings from Last 3 Encounters:  11/06/20 (!) 160/110  10/29/20 (!) 130/97  09/11/20 (!) 126/100   Pulse Readings from Last 3 Encounters:  11/06/20 (!) 117  10/29/20 85  09/11/20 90    Current Outpatient Medications  Medication Sig Dispense Refill   ALPRAZolam (XANAX) 0.5 MG tablet Take 0.25 mg by mouth 2 (two) times daily as needed for anxiety or sleep. 1./-1 tablet 2-3 times a day      amphetamine-dextroamphetamine (ADDERALL) 30 MG tablet Take 30-60 mg by mouth daily.     magnesium gluconate (MAGONATE) 500 MG tablet Take 500 mg by mouth daily.     omeprazole (PRILOSEC) 40 MG capsule Take 40 mg by mouth 2 (two) times daily.     QUEtiapine (SEROQUEL) 100 MG tablet Take 100 mg by mouth at bedtime. 1-2 tablet s at bedtime     rOPINIRole (REQUIP) 0.5 MG tablet Take 0.5 mg by mouth at bedtime.     ARIPiprazole (ABILIFY) 15 MG tablet Take 15 mg by mouth daily.  (Patient not taking: Reported on 11/06/2020)     aspirin EC 81 MG tablet Take 1 tablet (81 mg total) by mouth daily. For heart health (Patient not taking: Reported on 11/06/2020)  bisoprolol (ZEBETA) 5 MG tablet Take 1 tablet (5 mg total) by mouth daily. (Patient not taking: Reported on 11/06/2020) 90 tablet 1   divalproex (DEPAKOTE) 250 MG DR tablet Take 250 mg by mouth. Taking 3 at [facility] (Patient not taking: Reported on 11/06/2020)     doxazosin (CARDURA) 1 MG tablet Take 1 tablet (1 mg total) by mouth at bedtime. (Patient not taking: Reported on 11/06/2020) 90 tablet 1   fenofibrate (TRICOR) 145 MG tablet Take 145 mg by mouth daily.  (Patient not taking: Reported on 11/06/2020)     flecainide (TAMBOCOR) 50 MG tablet Take 50 mg by mouth once. Pt taking half a tablet daily. (Patient not taking: Reported on 11/06/2020)     folic acid (FOLVITE) 1 MG tablet Take 1 mg by mouth daily. (Patient not  taking: Reported on 11/06/2020)     glipiZIDE (GLUCOTROL XL) 10 MG 24 hr tablet Take 5 mg by mouth daily with breakfast.  (Patient not taking: Reported on 11/06/2020)     losartan (COZAAR) 25 MG tablet Take 1 tablet (25 mg total) by mouth daily. (Patient not taking: Reported on 11/06/2020) 90 tablet 3   metFORMIN (GLUCOPHAGE) 1000 MG tablet Take 1,000 mg by mouth 2 (two) times daily. (Patient not taking: Reported on 11/06/2020)     Multiple Vitamins-Minerals (CENTROVITE) TABS Take 1 tablet by mouth daily. (Patient not taking: Reported on 11/06/2020)     thiamine 100 MG tablet Take 1 tablet (100 mg total) by mouth daily. For low thiamine (Patient not taking: Reported on 11/06/2020) 1 tablet 0   vitamin E 180 MG (400 UNITS) capsule Take 1 capsule by mouth daily. (Patient not taking: Reported on 11/06/2020)     Current Facility-Administered Medications  Medication Dose Route Frequency Provider Last Rate Last Admin   0.9 %  sodium chloride infusion  500 mL Intravenous Continuous Nandigam, Eleonore Chiquito, MD        Allergies  Allergen Reactions   Codeine Nausea And Vomiting   Demerol [Meperidine] Other (See Comments)    Hallucinations    Metoprolol Other (See Comments)    nightmares    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   Allergy    Anxiety    Arthritis    Bipolar disorder (HCC)    Cardiac arrest (HCC) 06-2015, 08-2016   x3- pt has pacemaker    Chronic pain    back   Depression    Diabetes mellitus without complication (HCC)    Fatty liver    Fracture of lateral malleolus of right fibula    Hepatomegaly    Hyperlipidemia    Hypertension    OCD (obsessive compulsive disorder)    Pancreatic pseudocyst    Panic attack    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 rate 16, height 5\' 4"  (1.626 m), weight 188 lb (85.3 kg), SpO2 97 %.  Hypertension Patient with systolic/diastolic hypertension currently uncontrolled and not on medication.  Had long discussion about compliance and which medications should never be stopped suddenly.  Blood pressure is somewhat hard to treat in light of her syncopal episodes.  Explained the need for consistent daily medication for  her BP regulation.  For now will have her re-start the bisoprolol each morning and doxazosin each night.  She should continue with regular home BP monitoring and we will see her back in one month for follow up.     Phillips Hay PharmD CPP [facility] [address] [place], [place] [address] [phone]

## 2020-11-06 NOTE — Patient Instructions (Signed)
Return for a a follow up appointment December 1 at 2 pm  Check your blood pressure at home once or twice daily and keep record of the readings.  Take your BP meds as follows:  Take bisoprolol 5 mg once daily in the mornings  Take doxazosin 1 mg once daily at bedtime  Bring all of your meds, your BP cuff and your record of home blood pressures to your next appointment.  Exercise as you're able, try to walk approximately 30 minutes per day.  Keep salt intake to a minimum, especially watch canned and prepared boxed foods.  Eat more fresh fruits and vegetables and fewer canned items.  Avoid eating in fast food restaurants.    HOW TO TAKE YOUR BLOOD PRESSURE: . Rest 5 minutes before taking your blood pressure. .  Don't smoke or drink caffeinated beverages for at least 30 minutes before. . Take your blood pressure before (not after) you eat. . Sit comfortably with your back supported and both feet on the floor (don't cross your legs). . Elevate your arm to heart level on a table or a desk. . Use the proper sized cuff. It should fit smoothly and snugly around your bare upper arm. There should be enough room to slip a fingertip under the cuff. The bottom edge of the cuff should be 1 inch above the crease of the elbow. . Ideally, take 3 measurements at one sitting and record the average.

## 2020-11-10 ENCOUNTER — Encounter: Payer: Self-pay | Admitting: Pharmacist Clinician (PhC)/ Clinical Pharmacy Specialist

## 2020-11-10 DIAGNOSIS — I10 Essential (primary) hypertension: Secondary | ICD-10-CM | POA: Insufficient documentation

## 2020-11-10 NOTE — Assessment & Plan Note (Signed)
Patient with systolic/diastolic hypertension currently uncontrolled and not on medication.  Had long discussion about compliance and which medications should never be stopped suddenly.  Blood pressure is somewhat hard to treat in light of her syncopal episodes.  Explained the need for consistent daily medication for her BP regulation.  For now will have her re-start the bisoprolol each morning and doxazosin each night.  She should continue with regular home BP monitoring and we will see her back in one month for follow up.

## 2020-11-26 ENCOUNTER — Ambulatory Visit: Payer: Medicare Other

## 2020-11-29 ENCOUNTER — Inpatient Hospital Stay (HOSPITAL_COMMUNITY)
Admission: EM | Admit: 2020-11-29 | Discharge: 2020-12-02 | DRG: 439 | Disposition: A | Discharge status: Home / Self Care | Payer: Medicare Other | Attending: Internal Medicine | Admitting: Internal Medicine

## 2020-11-29 ENCOUNTER — Emergency Department (HOSPITAL_COMMUNITY): Payer: Medicare Other

## 2020-11-29 ENCOUNTER — Encounter (HOSPITAL_COMMUNITY): Payer: Self-pay

## 2020-11-29 ENCOUNTER — Other Ambulatory Visit: Payer: Self-pay

## 2020-11-29 DIAGNOSIS — F429 Obsessive-compulsive disorder, unspecified: Secondary | ICD-10-CM | POA: Diagnosis present

## 2020-11-29 DIAGNOSIS — E785 Hyperlipidemia, unspecified: Secondary | ICD-10-CM | POA: Diagnosis present

## 2020-11-29 DIAGNOSIS — K859 Acute pancreatitis without necrosis or infection, unspecified: Secondary | ICD-10-CM | POA: Diagnosis not present

## 2020-11-29 DIAGNOSIS — Z8371 Family history of colonic polyps: Secondary | ICD-10-CM

## 2020-11-29 DIAGNOSIS — Z885 Allergy status to narcotic agent status: Secondary | ICD-10-CM

## 2020-11-29 DIAGNOSIS — F419 Anxiety disorder, unspecified: Secondary | ICD-10-CM | POA: Diagnosis present

## 2020-11-29 DIAGNOSIS — F909 Attention-deficit hyperactivity disorder, unspecified type: Secondary | ICD-10-CM | POA: Diagnosis present

## 2020-11-29 DIAGNOSIS — Z841 Family history of disorders of kidney and ureter: Secondary | ICD-10-CM

## 2020-11-29 DIAGNOSIS — K852 Alcohol induced acute pancreatitis without necrosis or infection: Secondary | ICD-10-CM | POA: Diagnosis not present

## 2020-11-29 DIAGNOSIS — F4310 Post-traumatic stress disorder, unspecified: Secondary | ICD-10-CM | POA: Diagnosis present

## 2020-11-29 DIAGNOSIS — F17210 Nicotine dependence, cigarettes, uncomplicated: Secondary | ICD-10-CM | POA: Diagnosis present

## 2020-11-29 DIAGNOSIS — F332 Major depressive disorder, recurrent severe without psychotic features: Secondary | ICD-10-CM | POA: Diagnosis present

## 2020-11-29 DIAGNOSIS — I471 Supraventricular tachycardia: Secondary | ICD-10-CM | POA: Diagnosis present

## 2020-11-29 DIAGNOSIS — Z83438 Family history of other disorder of lipoprotein metabolism and other lipidemia: Secondary | ICD-10-CM

## 2020-11-29 DIAGNOSIS — Z720 Tobacco use: Secondary | ICD-10-CM

## 2020-11-29 DIAGNOSIS — I442 Atrioventricular block, complete: Secondary | ICD-10-CM | POA: Diagnosis present

## 2020-11-29 DIAGNOSIS — Z8249 Family history of ischemic heart disease and other diseases of the circulatory system: Secondary | ICD-10-CM

## 2020-11-29 DIAGNOSIS — G901 Familial dysautonomia [Riley-Day]: Secondary | ICD-10-CM | POA: Diagnosis present

## 2020-11-29 DIAGNOSIS — E1165 Type 2 diabetes mellitus with hyperglycemia: Secondary | ICD-10-CM | POA: Diagnosis present

## 2020-11-29 DIAGNOSIS — I10 Essential (primary) hypertension: Secondary | ICD-10-CM | POA: Diagnosis present

## 2020-11-29 DIAGNOSIS — Z79899 Other long term (current) drug therapy: Secondary | ICD-10-CM

## 2020-11-29 DIAGNOSIS — Z823 Family history of stroke: Secondary | ICD-10-CM

## 2020-11-29 DIAGNOSIS — K760 Fatty (change of) liver, not elsewhere classified: Secondary | ICD-10-CM | POA: Diagnosis present

## 2020-11-29 DIAGNOSIS — Z20822 Contact with and (suspected) exposure to covid-19: Secondary | ICD-10-CM | POA: Diagnosis present

## 2020-11-29 DIAGNOSIS — F319 Bipolar disorder, unspecified: Secondary | ICD-10-CM | POA: Diagnosis present

## 2020-11-29 DIAGNOSIS — Z888 Allergy status to other drugs, medicaments and biological substances status: Secondary | ICD-10-CM

## 2020-11-29 LAB — CBC
HCT: 47.1 % — ABNORMAL HIGH (ref 36.0–46.0)
Hemoglobin: 16.6 g/dL — ABNORMAL HIGH (ref 12.0–15.0)
MCH: 33.3 pg (ref 26.0–34.0)
MCHC: 35.2 g/dL (ref 30.0–36.0)
MCV: 94.4 fL (ref 80.0–100.0)
Platelets: 268 10*3/uL (ref 150–400)
RBC: 4.99 MIL/uL (ref 3.87–5.11)
RDW: 11.9 % (ref 11.5–15.5)
WBC: 7.1 10*3/uL (ref 4.0–10.5)
nRBC: 0 % (ref 0.0–0.2)

## 2020-11-29 LAB — COMPREHENSIVE METABOLIC PANEL
ALT: 42 U/L (ref 0–44)
AST: 26 U/L (ref 15–41)
Albumin: 4.8 g/dL (ref 3.5–5.0)
Alkaline Phosphatase: 119 U/L (ref 38–126)
Anion gap: 14 (ref 5–15)
BUN: 19 mg/dL (ref 6–20)
CO2: 24 mmol/L (ref 22–32)
Calcium: 9.4 mg/dL (ref 8.9–10.3)
Chloride: 90 mmol/L — ABNORMAL LOW (ref 98–111)
Creatinine, Ser: 0.91 mg/dL (ref 0.44–1.00)
GFR, Estimated: >60 mL/min (ref >60)
Glucose, Bld: 479 mg/dL — ABNORMAL HIGH (ref 70–99)
Potassium: 3.9 mmol/L (ref 3.5–5.1)
Sodium: 128 mmol/L — ABNORMAL LOW (ref 135–145)
Total Bilirubin: 1 mg/dL (ref 0.3–1.2)
Total Protein: 7.8 g/dL (ref 6.5–8.1)

## 2020-11-29 LAB — URINALYSIS, ROUTINE W REFLEX MICROSCOPIC
Bilirubin Urine: NEGATIVE
Glucose, UA: >=500 mg/dL — AB
Hgb urine dipstick: NEGATIVE
Ketones, ur: 20 mg/dL — AB
Leukocytes,Ua: NEGATIVE
Nitrite: NEGATIVE
Protein, ur: NEGATIVE mg/dL
Specific Gravity, Urine: 1.039 — ABNORMAL HIGH (ref 1.005–1.030)
pH: 5 (ref 5.0–8.0)

## 2020-11-29 LAB — TROPONIN I (HIGH SENSITIVITY)
Troponin I (High Sensitivity): 6 ng/L (ref <18)
Troponin I (High Sensitivity): 5 ng/L (ref <18)

## 2020-11-29 LAB — LIPASE, BLOOD: Lipase: 145 U/L — ABNORMAL HIGH (ref 11–51)

## 2020-11-29 MED ORDER — INSULIN ASPART 100 UNIT/ML ~~LOC~~ SOLN
2.0000 [IU] | Freq: Once | SUBCUTANEOUS | Status: AC
  Administered 2020-11-29: 2 [IU] via INTRAVENOUS
  Filled 2020-11-29: qty 0.02

## 2020-11-29 MED ORDER — IOHEXOL 300 MG/ML  SOLN
100.0000 mL | Freq: Once | INTRAMUSCULAR | Status: AC | PRN
  Administered 2020-11-29: 100 mL via INTRAVENOUS

## 2020-11-29 MED ORDER — MORPHINE SULFATE (PF) 4 MG/ML IV SOLN
4.0000 mg | Freq: Once | INTRAVENOUS | Status: AC
  Administered 2020-11-29: 4 mg via INTRAVENOUS
  Filled 2020-11-29: qty 1

## 2020-11-29 MED ORDER — ONDANSETRON 4 MG PO TBDP
4.0000 mg | ORAL_TABLET | Freq: Once | ORAL | Status: AC | PRN
  Administered 2020-11-29: 4 mg via ORAL
  Filled 2020-11-29: qty 1

## 2020-11-29 MED ORDER — ONDANSETRON HCL 4 MG/2ML IJ SOLN
4.0000 mg | Freq: Once | INTRAMUSCULAR | Status: AC
  Administered 2020-11-29: 4 mg via INTRAVENOUS
  Filled 2020-11-29: qty 2

## 2020-11-29 MED ORDER — PROMETHAZINE HCL 25 MG/ML IJ SOLN
12.5000 mg | Freq: Once | INTRAMUSCULAR | Status: AC
  Administered 2020-11-29: 12.5 mg via INTRAVENOUS
  Filled 2020-11-29: qty 1

## 2020-11-29 MED ORDER — HYDROMORPHONE HCL 1 MG/ML IJ SOLN
0.5000 mg | Freq: Once | INTRAMUSCULAR | Status: AC
  Administered 2020-11-29: 0.5 mg via INTRAVENOUS
  Filled 2020-11-29: qty 1

## 2020-11-29 MED ORDER — SODIUM CHLORIDE 0.9 % IV BOLUS
1000.0000 mL | Freq: Once | INTRAVENOUS | Status: AC
  Administered 2020-11-29: 1000 mL via INTRAVENOUS

## 2020-11-29 NOTE — ED Provider Notes (Signed)
[facility]   CSN: [id number] Arrival date & time: 11/29/20  1506     History Chief Complaint  Patient presents with  . Abdominal Pain    Brittney Cooper is a 59 y.o. female past medical history of pancreatitis (alcohol induced), bipolar, third-degree heart block with pacemaker who presents for evaluation of 2 weeks of epigastric abdominal pain.  He states that it started after she was at a baby shower and drink 2 glasses of wine.  He states since then, it has been persistent.  She has had some nausea and feeling like she is going to throw up.  States she has only had about 1 episode of vomiting.  She states she has not noted any fever.  She has a history of pancreatitis and states that this feels similar to her previous episodes of pancreatitis.  She reports that today, she started getting a "gnawing" feeling in her midsternal/epigastric chest area.  She denied any associated diaphoresis, vomiting with the pain.  She states it just feels like a gnawing type pain.  She states her pain is worse when she drinks water.  She has been trying to take intermittent Advil with no improvement in symptoms.  She has not noted any fevers, urinary complaints, SOB, diarrhea, constipation.  The history is provided by the patient.       Past Medical History:  Diagnosis Date  . ADHD (attention deficit hyperactivity disorder)   . Alcohol abuse   . 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 Allergy   . Anxiety   . Arthritis   . Bipolar disorder (HCC)   . Cardiac arrest (HCC) 06-2015, 08-2016   x3- pt has pacemaker   . Chronic pain    back  . Depression   . Diabetes mellitus without complication (HCC)   . Fatty liver   . Fracture of lateral malleolus of right fibula   . Hepatomegaly   . Hyperlipidemia   . Hypertension   . OCD (obsessive compulsive disorder)   . Pancreatic pseudocyst   . Panic attack   . 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 Syncope    neurocardiogenic  . Third degree heart block [facility]     Patient Active Problem List   Diagnosis Date Noted  . Hypertension 11/10/2020  . Atrial tachycardia (HCC) 03/12/2020  . Dysautonomia (HCC) 01/13/2020  . Pacemaker 01/13/2020  . Type 2 diabetes mellitus with hyperglycemia, without long-term current use of insulin (HCC) 02/09/2017  . MDD (major depressive disorder), recurrent severe, 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  . Complete heart block (HCC)   . Palpitation 10/11/2016  . Acute pancreatitis 09/15/2016  . Alcohol abuse 09/15/2016  . ADHD (attention deficit hyperactivity disorder) 09/15/2016  . Tobacco use 09/15/2016  . Anxiety 09/15/2016  . Hypokalemia 09/15/2016  . Pancreatitis 07/09/2016  . Alcoholic pancreatitis 07/08/2016  . Alcohol withdrawal (HCC) 07/08/2016  . Bipolar disorder (HCC) 07/08/2016  . 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 Left shoulder pain 09/09/2014    Past Surgical History:  Procedure Laterality Date  . ABDOMINAL HYSTERECTOMY    . ANTERIOR AND POSTERIOR REPAIR    . BUNIONECTOMY Bilateral   . CESAREAN SECTION    . EP IMPLANTABLE DEVICE N/A 10/12/2016   Procedure: Pacemaker Implant;  Surgeon: Will Jorja Loa, MD;  Location: [facility];  Service: Cardiovascular;  Laterality: N/A;  . finfger surgery    .  FINGER SURGERY    . LTCS    . MYRINGOTOMY    . ORIF ANKLE FRACTURE Right 07/25/2015   Procedure: OPEN REDUCTION INTERNAL FIXATION (ORIF) RIGHT ANKLE ;  Surgeon: Sheral Apley, MD;  Location: [facility];  Service: Orthopedics;  Laterality: Right;  . PACEMAKER PLACEMENT    . RHINOPLASTY    . TONSILLECTOMY    . TUBAL LIGATION       OB History   No obstetric history on file.     Family History  Problem Relation Age of Onset  . Stroke Mother   . Kidney disease Mother   . Colon polyps Mother   . Hypertension Mother   . Kidney  failure Mother   . Lung cancer Father        stage IV   . Colon polyps Father   . Hypertension Father   . Stomach cancer Maternal Grandfather   . Colon cancer Paternal Grandfather   . Hypertension Sister   . Hyperlipidemia Sister   . Hypertension Brother   . Hyperlipidemia Brother   . Rectal cancer Neg Hx   . Esophageal cancer Neg Hx   . Pancreatic cancer Neg Hx     Social History   Tobacco Use  . Smoking status: Current Every Day Smoker    Packs/day: 1.00    Years: 15.00    Pack years: 15.00    Types: Cigarettes  . Smokeless tobacco: Never Used  Vaping Use  . Vaping Use: Some days  Substance Use Topics  . Alcohol use: Yes    Alcohol/week: 0.0 standard drinks    Comment: drinks for couple of days, then no alcohol for days  . Drug use: No    Home Medications Prior to Admission medications   Medication Sig Start Date End Date Taking? Authorizing Provider  ALPRAZolam (XANAX) 0.5 MG tablet Take 0.25 mg by mouth 2 (two) times daily as needed for anxiety or sleep. 1./-1 tablet 2-3 times a day     [provider]  amphetamine-dextroamphetamine (ADDERALL) 30 MG tablet Take 30-60 mg by mouth daily.    [provider]  ARIPiprazole (ABILIFY) 15 MG tablet Take 15 mg by mouth daily.  Patient not taking: Reported on 11/06/2020 11/12/19   [provider]  aspirin EC 81 MG tablet Take 1 tablet (81 mg total) by mouth daily. For heart health Patient not taking: Reported on 11/06/2020 02/09/17   Armandina Stammer I, NP  bisoprolol (ZEBETA) 5 MG tablet Take 1 tablet (5 mg total) by mouth daily. Patient not taking: Reported on 11/06/2020 08/14/20   Chilton Si, MD  divalproex (DEPAKOTE) 250 MG DR tablet Take 250 mg by mouth. Taking 3 at [facility] Patient not taking: Reported on 11/06/2020    [provider]  doxazosin (CARDURA) 1 MG tablet Take 1 tablet (1 mg total) by mouth at bedtime. Patient not taking: Reported on 11/06/2020 08/14/20   Chilton Si, MD   fenofibrate (TRICOR) 145 MG tablet Take 145 mg by mouth daily.  Patient not taking: Reported on 11/06/2020 03/14/19   [provider]  flecainide (TAMBOCOR) 50 MG tablet Take 50 mg by mouth once. Pt taking half a tablet daily. Patient not taking: Reported on 11/06/2020    [provider]  folic acid (FOLVITE) 1 MG tablet Take 1 mg by mouth daily. Patient not taking: Reported on 11/06/2020 12/25/19   [provider]  glipiZIDE (GLUCOTROL XL) 10 MG 24 hr tablet Take 5 mg by  mouth daily with breakfast.  Patient not taking: Reported on 11/06/2020 05/25/17   [provider]  losartan (COZAAR) 25 MG tablet Take 1 tablet (25 mg total) by mouth daily. Patient not taking: Reported on 11/06/2020 09/11/20 12/10/20  Chilton Siandolph, Tiffany, MD  magnesium gluconate (MAGONATE) 500 MG tablet Take 500 mg by mouth daily.    [provider]  metFORMIN (GLUCOPHAGE) 1000 MG tablet Take 1,000 mg by mouth 2 (two) times daily. Patient not taking: Reported on 11/06/2020 12/25/19   [provider]  Multiple Vitamins-Minerals (CENTROVITE) TABS Take 1 tablet by mouth daily. Patient not taking: Reported on 11/06/2020 04/25/18   [provider]  omeprazole (PRILOSEC) 40 MG capsule Take 40 mg by mouth 2 (two) times daily.    [provider]  QUEtiapine (SEROQUEL) 100 MG tablet Take 100 mg by mouth at bedtime. 1-2 tablet s at bedtime    [provider]  rOPINIRole (REQUIP) 0.5 MG tablet Take 0.5 mg by mouth at bedtime.    [provider]  thiamine 100 MG tablet Take 1 tablet (100 mg total) by mouth daily. For low thiamine Patient not taking: Reported on 11/06/2020 02/09/17   Armandina StammerNwoko, Agnes I, NP  vitamin E 180 MG (400 UNITS) capsule Take 1 capsule by mouth daily. Patient not taking: Reported on 11/06/2020    [provider]    Allergies    Codeine, Demerol [meperidine], and Metoprolol  Review of Systems   Review of Systems    Constitutional: Negative for fever.  Respiratory: Negative for cough and shortness of breath.   Cardiovascular: Negative for chest pain.  Gastrointestinal: Positive for abdominal pain, nausea and vomiting.  Genitourinary: Negative for dysuria and hematuria.  Neurological: Negative for headaches.  All other systems reviewed and are negative.   Physical Exam Updated Vital Signs BP (!) 171/115   Pulse 76   Temp 97.9 F (36.6 C) (Oral)   Resp 19   Ht 5\' 4"  (1.626 m)   Wt 81.6 kg   SpO2 91%   BMI 30.90 kg/m   Physical Exam Vitals and nursing note reviewed.  Constitutional:      Appearance: Normal appearance. She is well-developed.  HENT:     Head: Normocephalic and atraumatic.  Eyes:     General: Lids are normal.     Conjunctiva/sclera: Conjunctivae normal.     Pupils: Pupils are equal, round, and reactive to light.  Cardiovascular:     Rate and Rhythm: Normal rate and regular rhythm.     Pulses: Normal pulses.     Heart sounds: Normal heart sounds. No murmur heard.  No friction rub. No gallop.   Pulmonary:     Effort: Pulmonary effort is normal.     Breath sounds: Normal breath sounds.     Comments: Lungs clear to auscultation bilaterally.  Symmetric chest rise.  No wheezing, rales, rhonchi. Abdominal:     Palpations: Abdomen is soft. Abdomen is not rigid.     Tenderness: There is abdominal tenderness in the epigastric area. There is no guarding.     Comments: Abdomen is soft, nondistended.  Tenderness palpation in the epigastric region.  No rigidity, guarding.  No CVA tenderness noted bilaterally.  Musculoskeletal:        General: Normal range of motion.     Cervical back: Full passive range of motion without pain.  Skin:    General: Skin is warm and dry.     Capillary Refill: Capillary refill takes less than 2  seconds.  Neurological:     Mental Status: She is alert and oriented to person, place, and time.  Psychiatric:        Speech: Speech normal.     ED  Results / Procedures / Treatments   Labs (all labs ordered are listed, but only abnormal results are displayed) Labs Reviewed  LIPASE, BLOOD - Abnormal; Notable for the following components:      Result Value   Lipase 145 (*)    All other components within normal limits  COMPREHENSIVE METABOLIC PANEL - Abnormal; Notable for the following components:   Sodium 128 (*)    Chloride 90 (*)    Glucose, Bld 479 (*)    All other components within normal limits  CBC - Abnormal; Notable for the following components:   Hemoglobin 16.6 (*)    HCT 47.1 (*)    All other components within normal limits  URINALYSIS, ROUTINE W REFLEX MICROSCOPIC - Abnormal; Notable for the following components:   Specific Gravity, Urine 1.039 (*)    Glucose, UA >=500 (*)    Ketones, ur 20 (*)    Bacteria, UA RARE (*)    All other components within normal limits  RESP PANEL BY RT-PCR (FLU A&B, COVID) ARPGX2  TROPONIN I (HIGH SENSITIVITY)  TROPONIN I (HIGH SENSITIVITY)    EKG EKG Interpretation  Date/Time:  Saturday November 29 2020 15:58:05 EST Ventricular Rate:  96 PR Interval:    QRS Duration: 89 QT Interval:  398 QTC Calculation: 503 R Axis:   9 Text Interpretation: Sinus rhythm Borderline T wave abnormalities Borderline prolonged QT interval Baseline wander in lead(s) I II aVR V1 12 Lead; Mason-Likar Abnormal ECG Confirmed by Gerhard Munch [phone]) on 11/29/2020 4:03:06 PM   Radiology CT ABDOMEN PELVIS W CONTRAST  Result Date: 11/29/2020 CLINICAL DATA:  Abdominal pain EXAM: CT ABDOMEN AND PELVIS WITH CONTRAST TECHNIQUE: Multidetector CT imaging of the abdomen and pelvis was performed using the standard protocol following bolus administration of intravenous contrast. CONTRAST:  OMNIPAQUE IOHEXOL 300 MG/ML  SOLN COMPARISON:  September 15, 2016 FINDINGS: Lower chest: The visualized heart size within normal limits. No pericardial fluid/thickening. No hiatal hernia. The visualized portions of the lungs  are clear. Hepatobiliary: There is diffuse low density seen throughout the liver parenchyma with mild hepatomegaly. The main portal vein is patent. No evidence of calcified gallstones, gallbladder wall thickening or biliary dilatation. Pancreas: There is increased fat stranding changes seen around the pancreas diffusely. No peripancreatic fluid collections or evidence of necrosis is seen. Spleen: Normal in size without focal abnormality. Adrenals/Urinary Tract: Both adrenal glands appear normal. The kidneys and collecting system appear normal without evidence of urinary tract calculus or hydronephrosis. Bladder is unremarkable. Stomach/Bowel: The stomach, are normal in appearance. There is mildly increased stranding changes seen around the duodenum. The remainder of the small bowel is unremarkable. Scattered colonic diverticula are noted. Vascular/Lymphatic: There are no enlarged mesenteric, retroperitoneal, or pelvic lymph nodes. No significant vascular findings are present. Reproductive: The patient is status post hysterectomy. No adnexal masses or collections seen. Other: No evidence of abdominal wall mass or hernia. Musculoskeletal: No acute or significant osseous findings. IMPRESSION: 1. Findings suggestive mild acute pancreatitis. No peripancreatic fluid collections or evidence of pancreatic necrosis. 2. Hepatic steatosis and mild hepatomegaly. Electronically Signed   By: Jonna Clark M.D.   On: 11/29/2020 19:45   DG Chest Portable 1 View  Result Date: 11/29/2020 CLINICAL DATA:  Epigastric pain for 1 week, nausea EXAM:  PORTABLE CHEST 1 VIEW COMPARISON:  10/29/2020 FINDINGS: Single frontal view of the chest demonstrates stable dual lead pacemaker. Cardiac silhouette is unremarkable. No airspace disease, effusion, or pneumothorax. IMPRESSION: 1. No acute intrathoracic process. Electronically Signed   By: Sharlet Salina M.D.   On: 11/29/2020 19:11    Procedures Procedures (including critical care  time)  Medications Ordered in ED Medications  ondansetron (ZOFRAN-ODT) disintegrating tablet 4 mg (4 mg Oral Given 11/29/20 1601)  ondansetron (ZOFRAN) injection 4 mg (4 mg Intravenous Given 11/29/20 1845)  morphine 4 MG/ML injection 4 mg (4 mg Intravenous Given 11/29/20 1845)  iohexol (OMNIPAQUE) 300 MG/ML solution 100 mL (100 mLs Intravenous Contrast Given 11/29/20 1923)  insulin aspart (novoLOG) injection 2 Units (2 Units Intravenous Given 11/29/20 2116)  sodium chloride 0.9 % bolus 1,000 mL (0 mLs Intravenous Stopped 11/29/20 2305)  morphine 4 MG/ML injection 4 mg (4 mg Intravenous Given 11/29/20 2110)  HYDROmorphone (DILAUDID) injection 0.5 mg (0.5 mg Intravenous Given 11/29/20 2235)  promethazine (PHENERGAN) injection 12.5 mg (12.5 mg Intravenous Given 11/29/20 2231)    ED Course  I have reviewed the triage vital signs and the nursing notes.  Pertinent labs & imaging results that were available during my care of the patient were reviewed by me and considered in my medical decision making (see chart for details).    MDM Rules/Calculators/A&P                          59 year old female possible history of alcohol induced pancreatitis who presents for evaluation of epigastric pain, nausea/vomiting.  Reports it initially started 2 weeks ago after she had wine at a baby shower.  Patient reports that this feels similar to her previous episodes of pancreatitis.  She also reports today, she started experiencing some "gnawing" pain to the midsternal/epigastric chest area.  On initially arrival, she is afebrile, nontoxic-appearing.  Vital signs are stable.  She is hypertensive.  She tells me on exam, she has tenderness palpation noted to the epigastric region.  No rigidity, guarding.  Question if this is pancreatitis given history of alcohol and though this has been going on for such a long time.  Also question if this is PUD that is contributing to her symptoms.  I doubt that this is ACS etiology but she  does have a history of complete heart block.  On my evaluation, she does not appear to be in heart block.  We will plan for an EKG and troponin.  Additionally, lab work ordered at triage.  CMP shows sodium 128, BUN of 19, creatinine of 0.91.  Her corrected sodium from glucose is 134. Lipase is elevated at 145.  CBC shows no leukocytosis.  Hemoglobin is stable at 16.6. Troponin is normal.   Chest x-ray is negative for any acute abnormalities.  CT scan shows findings suggestive of mild acute pancreatitis.  No peripancreatic fluid collections.  No necrosis.  Reevaluation.  Patient still with pain.  We will try additional analgesics and reassess.  Reevaluation.  Patient was still continued pain.  She states it is 7/10.  Will give additional analgesics and reassess.  She still feeling nauseous.  Patient has not had 3 rounds of pain medication.  She still is having significant tenderness noted on exam and does not feel that her pain has been controlled.  We discussed treatment options.  At this time, patient is unable to get her pain controlled here in the emergency department.  We  will plan for admission for pain control.  Discussed patient with Dr. Artis Flock who accepts patient for admission.   Portions of this note were generated with Scientist, clinical (histocompatibility and immunogenetics). Dictation errors may occur despite best attempts at proofreading.   Final Clinical Impression(s) / ED Diagnoses Final diagnoses:  Alcohol-induced acute pancreatitis, unspecified complication status  Hypertension, unspecified type    Rx / DC Orders ED Discharge Orders    None       Rosana Hoes 11/30/20 0016    Benjiman Core, MD 12/01/20 0025

## 2020-11-29 NOTE — ED Triage Notes (Signed)
Pt POV-  Pt c/o epigastric pain x1 week, reports it feels like previous episode of pancreatitis.  C/o nausea when taking meds.  Pt reports PO intake for "a few days."

## 2020-11-30 ENCOUNTER — Encounter (HOSPITAL_COMMUNITY): Payer: Self-pay | Admitting: Family Medicine

## 2020-11-30 DIAGNOSIS — Z823 Family history of stroke: Secondary | ICD-10-CM | POA: Diagnosis not present

## 2020-11-30 DIAGNOSIS — F319 Bipolar disorder, unspecified: Secondary | ICD-10-CM | POA: Diagnosis present

## 2020-11-30 DIAGNOSIS — Z885 Allergy status to narcotic agent status: Secondary | ICD-10-CM | POA: Diagnosis not present

## 2020-11-30 DIAGNOSIS — K852 Alcohol induced acute pancreatitis without necrosis or infection: Principal | ICD-10-CM

## 2020-11-30 DIAGNOSIS — Z888 Allergy status to other drugs, medicaments and biological substances status: Secondary | ICD-10-CM | POA: Diagnosis not present

## 2020-11-30 DIAGNOSIS — E785 Hyperlipidemia, unspecified: Secondary | ICD-10-CM | POA: Diagnosis present

## 2020-11-30 DIAGNOSIS — F4310 Post-traumatic stress disorder, unspecified: Secondary | ICD-10-CM | POA: Diagnosis present

## 2020-11-30 DIAGNOSIS — E1165 Type 2 diabetes mellitus with hyperglycemia: Secondary | ICD-10-CM | POA: Diagnosis present

## 2020-11-30 DIAGNOSIS — I471 Supraventricular tachycardia: Secondary | ICD-10-CM | POA: Diagnosis present

## 2020-11-30 DIAGNOSIS — Z79899 Other long term (current) drug therapy: Secondary | ICD-10-CM | POA: Diagnosis not present

## 2020-11-30 DIAGNOSIS — I10 Essential (primary) hypertension: Secondary | ICD-10-CM

## 2020-11-30 DIAGNOSIS — F909 Attention-deficit hyperactivity disorder, unspecified type: Secondary | ICD-10-CM | POA: Diagnosis present

## 2020-11-30 DIAGNOSIS — I442 Atrioventricular block, complete: Secondary | ICD-10-CM

## 2020-11-30 DIAGNOSIS — F419 Anxiety disorder, unspecified: Secondary | ICD-10-CM | POA: Diagnosis present

## 2020-11-30 DIAGNOSIS — K859 Acute pancreatitis without necrosis or infection, unspecified: Secondary | ICD-10-CM | POA: Diagnosis present

## 2020-11-30 DIAGNOSIS — Z20822 Contact with and (suspected) exposure to covid-19: Secondary | ICD-10-CM | POA: Diagnosis present

## 2020-11-30 DIAGNOSIS — K760 Fatty (change of) liver, not elsewhere classified: Secondary | ICD-10-CM | POA: Diagnosis present

## 2020-11-30 DIAGNOSIS — F332 Major depressive disorder, recurrent severe without psychotic features: Secondary | ICD-10-CM

## 2020-11-30 DIAGNOSIS — F17210 Nicotine dependence, cigarettes, uncomplicated: Secondary | ICD-10-CM | POA: Diagnosis present

## 2020-11-30 DIAGNOSIS — Z720 Tobacco use: Secondary | ICD-10-CM

## 2020-11-30 DIAGNOSIS — Z841 Family history of disorders of kidney and ureter: Secondary | ICD-10-CM | POA: Diagnosis not present

## 2020-11-30 DIAGNOSIS — Z8249 Family history of ischemic heart disease and other diseases of the circulatory system: Secondary | ICD-10-CM | POA: Diagnosis not present

## 2020-11-30 DIAGNOSIS — F429 Obsessive-compulsive disorder, unspecified: Secondary | ICD-10-CM | POA: Diagnosis present

## 2020-11-30 DIAGNOSIS — Z8371 Family history of colonic polyps: Secondary | ICD-10-CM | POA: Diagnosis not present

## 2020-11-30 DIAGNOSIS — G901 Familial dysautonomia [Riley-Day]: Secondary | ICD-10-CM | POA: Diagnosis present

## 2020-11-30 DIAGNOSIS — Z83438 Family history of other disorder of lipoprotein metabolism and other lipidemia: Secondary | ICD-10-CM | POA: Diagnosis not present

## 2020-11-30 LAB — COMPREHENSIVE METABOLIC PANEL
ALT: 31 U/L (ref 0–44)
AST: 19 U/L (ref 15–41)
Albumin: 4 g/dL (ref 3.5–5.0)
Alkaline Phosphatase: 98 U/L (ref 38–126)
Anion gap: 10 (ref 5–15)
BUN: 14 mg/dL (ref 6–20)
CO2: 26 mmol/L (ref 22–32)
Calcium: 8.9 mg/dL (ref 8.9–10.3)
Chloride: 97 mmol/L — ABNORMAL LOW (ref 98–111)
Creatinine, Ser: 0.68 mg/dL (ref 0.44–1.00)
GFR, Estimated: >60 mL/min (ref >60)
Glucose, Bld: 281 mg/dL — ABNORMAL HIGH (ref 70–99)
Potassium: 3.9 mmol/L (ref 3.5–5.1)
Sodium: 133 mmol/L — ABNORMAL LOW (ref 135–145)
Total Bilirubin: 0.8 mg/dL (ref 0.3–1.2)
Total Protein: 6.7 g/dL (ref 6.5–8.1)

## 2020-11-30 LAB — LIPID PANEL
Cholesterol: 212 mg/dL — ABNORMAL HIGH (ref 0–200)
HDL: 48 mg/dL (ref >40)
LDL Cholesterol: 138 mg/dL — ABNORMAL HIGH (ref 0–99)
Total CHOL/HDL Ratio: 4.4 RATIO
Triglycerides: 129 mg/dL (ref <150)
VLDL: 26 mg/dL (ref 0–40)

## 2020-11-30 LAB — CBC
HCT: 42.7 % (ref 36.0–46.0)
Hemoglobin: 14.6 g/dL (ref 12.0–15.0)
MCH: 33.1 pg (ref 26.0–34.0)
MCHC: 34.2 g/dL (ref 30.0–36.0)
MCV: 96.8 fL (ref 80.0–100.0)
Platelets: 212 10*3/uL (ref 150–400)
RBC: 4.41 MIL/uL (ref 3.87–5.11)
RDW: 12 % (ref 11.5–15.5)
WBC: 6.2 10*3/uL (ref 4.0–10.5)
nRBC: 0 % (ref 0.0–0.2)

## 2020-11-30 LAB — GLUCOSE, CAPILLARY
Glucose-Capillary: 255 mg/dL — ABNORMAL HIGH (ref 70–99)
Glucose-Capillary: 218 mg/dL — ABNORMAL HIGH (ref 70–99)
Glucose-Capillary: 157 mg/dL — ABNORMAL HIGH (ref 70–99)
Glucose-Capillary: 121 mg/dL — ABNORMAL HIGH (ref 70–99)

## 2020-11-30 LAB — RESP PANEL BY RT-PCR (FLU A&B, COVID) ARPGX2
Influenza A by PCR: NEGATIVE
Influenza B by PCR: NEGATIVE
SARS Coronavirus 2 by RT PCR: NEGATIVE

## 2020-11-30 LAB — CBG MONITORING, ED: Glucose-Capillary: 289 mg/dL — ABNORMAL HIGH (ref 70–99)

## 2020-11-30 LAB — HEMOGLOBIN A1C
Hgb A1c MFr Bld: 9 % — ABNORMAL HIGH (ref 4.8–5.6)
Mean Plasma Glucose: 211.6 mg/dL

## 2020-11-30 LAB — HIV ANTIBODY (ROUTINE TESTING W REFLEX): HIV Screen 4th Generation wRfx: NONREACTIVE

## 2020-11-30 MED ORDER — LOSARTAN POTASSIUM 25 MG PO TABS
25.0000 mg | ORAL_TABLET | Freq: Every day | ORAL | Status: DC
  Administered 2020-11-30 – 2020-12-02 (×3): 25 mg via ORAL
  Filled 2020-11-30 (×3): qty 1

## 2020-11-30 MED ORDER — ENOXAPARIN SODIUM 40 MG/0.4ML ~~LOC~~ SOLN
40.0000 mg | SUBCUTANEOUS | Status: DC
  Administered 2020-11-30 – 2020-12-01 (×2): 40 mg via SUBCUTANEOUS
  Filled 2020-11-30 (×2): qty 0.4

## 2020-11-30 MED ORDER — HYDROCODONE-ACETAMINOPHEN 5-325 MG PO TABS: 1.0000 | ORAL_TABLET | ORAL | Status: DC | PRN

## 2020-11-30 MED ORDER — IBUPROFEN 200 MG PO TABS
400.0000 mg | ORAL_TABLET | Freq: Four times a day (QID) | ORAL | Status: DC | PRN
  Administered 2020-11-30 – 2020-12-01 (×2): 400 mg via ORAL
  Filled 2020-11-30 (×2): qty 2

## 2020-11-30 MED ORDER — NICOTINE 21 MG/24HR TD PT24
21.0000 mg | MEDICATED_PATCH | Freq: Every day | TRANSDERMAL | Status: DC
  Administered 2020-11-30 – 2020-12-02 (×3): 21 mg via TRANSDERMAL
  Filled 2020-11-30 (×3): qty 1

## 2020-11-30 MED ORDER — QUETIAPINE FUMARATE 200 MG PO TABS
200.0000 mg | ORAL_TABLET | Freq: Every day | ORAL | Status: DC
  Filled 2020-11-30: qty 1

## 2020-11-30 MED ORDER — INSULIN ASPART 100 UNIT/ML ~~LOC~~ SOLN
0.0000 [IU] | Freq: Three times a day (TID) | SUBCUTANEOUS | Status: DC
  Administered 2020-11-30: 2 [IU] via SUBCUTANEOUS
  Administered 2020-11-30 (×2): 3 [IU] via SUBCUTANEOUS
  Administered 2020-12-01: 5 [IU] via SUBCUTANEOUS
  Administered 2020-12-02: 3 [IU] via SUBCUTANEOUS
  Filled 2020-11-30: qty 0.09

## 2020-11-30 MED ORDER — QUETIAPINE FUMARATE 100 MG PO TABS
100.0000 mg | ORAL_TABLET | Freq: Every day | ORAL | Status: DC
  Administered 2020-11-30: 100 mg via ORAL
  Filled 2020-11-30: qty 1

## 2020-11-30 MED ORDER — ALPRAZOLAM 0.25 MG PO TABS
0.2500 mg | ORAL_TABLET | Freq: Two times a day (BID) | ORAL | Status: DC | PRN
  Administered 2020-11-30: 0.25 mg via ORAL
  Filled 2020-11-30: qty 1

## 2020-11-30 MED ORDER — PROMETHAZINE HCL 25 MG PO TABS
12.5000 mg | ORAL_TABLET | Freq: Four times a day (QID) | ORAL | Status: DC | PRN
  Administered 2020-11-30 – 2020-12-02 (×6): 12.5 mg via ORAL
  Filled 2020-11-30 (×6): qty 1

## 2020-11-30 MED ORDER — DOXAZOSIN MESYLATE 1 MG PO TABS
1.0000 mg | ORAL_TABLET | Freq: Every day | ORAL | Status: DC
  Administered 2020-11-30 – 2020-12-01 (×2): 1 mg via ORAL
  Filled 2020-11-30 (×2): qty 1

## 2020-11-30 MED ORDER — FLECAINIDE ACETATE 50 MG PO TABS
50.0000 mg | ORAL_TABLET | Freq: Once | ORAL | Status: AC
  Administered 2020-11-30: 50 mg via ORAL
  Filled 2020-11-30: qty 1

## 2020-11-30 MED ORDER — BISOPROLOL FUMARATE 5 MG PO TABS
5.0000 mg | ORAL_TABLET | Freq: Every day | ORAL | Status: DC
  Administered 2020-11-30 – 2020-12-02 (×3): 5 mg via ORAL
  Filled 2020-11-30 (×4): qty 1

## 2020-11-30 MED ORDER — THIAMINE HCL 100 MG PO TABS
100.0000 mg | ORAL_TABLET | Freq: Every day | ORAL | Status: DC
  Administered 2020-12-02: 100 mg via ORAL
  Filled 2020-11-30 (×2): qty 1

## 2020-11-30 MED ORDER — PANTOPRAZOLE SODIUM 40 MG PO TBEC
40.0000 mg | DELAYED_RELEASE_TABLET | Freq: Two times a day (BID) | ORAL | Status: DC
  Administered 2020-11-30 – 2020-12-02 (×6): 40 mg via ORAL
  Filled 2020-11-30 (×6): qty 1

## 2020-11-30 MED ORDER — INSULIN ASPART 100 UNIT/ML ~~LOC~~ SOLN
0.0000 [IU] | Freq: Every day | SUBCUTANEOUS | Status: DC
  Administered 2020-12-01: 3 [IU] via SUBCUTANEOUS
  Filled 2020-11-30: qty 0.05

## 2020-11-30 MED ORDER — SODIUM CHLORIDE 0.9 % IV SOLN: INTRAVENOUS | Status: DC

## 2020-11-30 MED ORDER — FOLIC ACID 1 MG PO TABS
1.0000 mg | ORAL_TABLET | Freq: Every day | ORAL | Status: DC
  Administered 2020-12-02: 1 mg via ORAL
  Filled 2020-11-30 (×2): qty 1

## 2020-11-30 MED ORDER — ROPINIROLE HCL 1 MG PO TABS
0.5000 mg | ORAL_TABLET | Freq: Every day | ORAL | Status: DC
  Administered 2020-11-30 – 2020-12-01 (×2): 0.5 mg via ORAL
  Filled 2020-11-30 (×2): qty 1

## 2020-11-30 MED ORDER — GLIPIZIDE ER 5 MG PO TB24
5.0000 mg | ORAL_TABLET | Freq: Every day | ORAL | Status: DC
  Administered 2020-11-30 – 2020-12-02 (×3): 5 mg via ORAL
  Filled 2020-11-30 (×4): qty 1

## 2020-11-30 MED ORDER — HYDROMORPHONE HCL 1 MG/ML IJ SOLN
0.5000 mg | INTRAMUSCULAR | Status: DC | PRN
  Administered 2020-11-30: 1 mg via INTRAVENOUS
  Administered 2020-11-30: 0.5 mg via INTRAVENOUS
  Administered 2020-11-30 – 2020-12-01 (×4): 1 mg via INTRAVENOUS
  Filled 2020-11-30 (×6): qty 1

## 2020-11-30 NOTE — ED Notes (Signed)
Report called to floor

## 2020-11-30 NOTE — Progress Notes (Signed)
Same day note  Patient seen and examined at bedside.  Patient was admitted to the hospital for abdominal pain  At the time of my evaluation, patient complains of abdominal pain.  No nausea vomiting at this time.  Around 5/10 in intensity.  Physical examination reveals average built female not in obvious distress, epigastric tenderness on palpation.  Laboratory data and imaging was reviewed  Assessment and Plan.   Acute alcoholic pancreatitis CT scan with mild pancreatitis.  Lipase was elevated at 145.  Continue conservative treatment.  Continue IV fluids supportive care with IV narcotics and NSAIDs .  Is a still n.p.o.  Will change to clears today.  Continue normal saline at 125 mL/h.    Anxiety/depressive disorder, MDD. Patient has been weaned off medications.  Currently on Seroquel and Xanax..  Patient has stopped taking Abilify and Depakote.  Will need to follow-up as outpatient.   Complete heart block (HCC) with pacemaker  Received flecainide 50 mg x 1 today.  On 20 mg daily at home.  Will need to resume, followed by Dr. Graciela Husbands, cardiology as outpatient.  Continue bisoprolol   Type 2 diabetes mellitus with hyperglycemia, without long-term current use of insulin  Was on glipizide Metformin.  Was not taking at home.  Glipizide has been restarted here.  Continue sliding scale insulin, Accu-Cheks.  Hemoglobin A1c is pending.    Hypertension -Continue Cozaar, Cardura and Zebeta.  Follows up with hypertension clinic with Dr. Duke Salvia.  We will continue to monitor closely.  She does have dysautonomia so we will need to close titration.     Tobacco use disorder.  Smokes 1 pack/day.  Continue nicotine patch.  DVT prophylaxis.  We will add Lovenox subcu . No Charge  Signed,  Tenny Craw, MD [facility]

## 2020-11-30 NOTE — ED Notes (Signed)
Attempted to call report on patient. Secretary asked that we call back

## 2020-11-30 NOTE — H&P (Signed)
History and Physical    Brittney Cooper KZS:[id number] DOB: 04/17/1961 DOA: 11/29/2020  PCP: Cheron Schaumann., MD  Patient coming from: home   I have personally briefly reviewed patient's old medical records in [facility]  Chief Complaint: stomach pain   HPI: Brittney Cooper is a 59 y.o. female with medical history significant of alcoholic pancreatitis, HTN, type 2 diabetes, depression, anxiety, bipolar, complete heart block with pace maker, dysautonomia and atrial tachycardia. Followed by Dr. Graciela Husbands (cardiology for her pacemaker) as well as Dr. Duke Salvia for her blood pressure. She has poor compliance with taking medication . She states a couple weeks ago went to her nieces baby shower and had one glass of wine. Next day she started to have epigastric pain. It felt like pancreatitis so she started to do clear liquids and it got better for 1-2 days then came back on again. She has been in the bed x 2 weeks. Pain is epigastric and rated as 5/10. Does not radiate anywhere and is described as sharp and stabbing. She also states it's also like an ache, but mainly stabbing. Pain is constant. Drinking water may make it worse. She has been taking advil and tylenol and the advil seems to help her some. She has had some nausea, but no vomiting or diarrhea. She has had no fever/chills or recent infections. She has had some sweating due to the pain. She also thought her heart felt weird but she wasn't sure if having anxiety. She has hx of complete heart block with pacemaker.    ED Course: presented to ER due to stomach pain. Vitals on arrival: BP: 171/115, HR of 76, afebrile, RR of 19 and oxygen 91% room air. Pertinent labs included elevated lipase and CT confirming mild pancreatitis. CXR wnl. Troponin wnl. Hx of diabetes and stopped all meds. Glucose 479 with  No AG or evidence of DKA. No white count. Given morphine with no easement of pain. Given dilaudid which helped bring pain down. Asked to admit for  pain control secondary to acute pancreatitis.   Review of Systems: As per HPI otherwise all other systems reviewed and are negative.   Past Medical History:  Diagnosis Date  . ADHD (attention deficit hyperactivity disorder)   . Alcohol abuse   . 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 Allergy   . Anxiety   . Arthritis   . Bipolar disorder (HCC)   . Cardiac arrest (HCC) 06-2015, 08-2016   x3- pt has pacemaker   . Chronic pain    back  . Depression   . Diabetes mellitus without complication (HCC)   . Fatty liver   . Fracture of lateral malleolus of right fibula   . Hepatomegaly   . Hyperlipidemia   . Hypertension   . OCD (obsessive compulsive disorder)   . Pancreatic pseudocyst   . Panic attack   . 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 Syncope    neurocardiogenic  . Third degree heart block [facility])     Past Surgical History:  Procedure Laterality Date  . ABDOMINAL HYSTERECTOMY    . ANTERIOR AND POSTERIOR REPAIR    . BUNIONECTOMY Bilateral   . CESAREAN SECTION    . EP IMPLANTABLE DEVICE N/A 10/12/2016   Procedure: Pacemaker Implant;  Surgeon: Will Jorja Loa, MD;  Location: [facility];  Service: Cardiovascular;  Laterality: N/A;  . finfger surgery    .  FINGER SURGERY    . LTCS    . MYRINGOTOMY    . ORIF ANKLE FRACTURE Right 07/25/2015   Procedure: OPEN REDUCTION INTERNAL FIXATION (ORIF) RIGHT ANKLE ;  Surgeon: Sheral Apleyimothy D Murphy, MD;  Location: [facility];  Service: Orthopedics;  Laterality: Right;  . PACEMAKER PLACEMENT    . RHINOPLASTY    . TONSILLECTOMY    . TUBAL LIGATION      Social History  reports that she has been smoking cigarettes. She has a 15.00 pack-year smoking history. She has never used smokeless tobacco. She reports current alcohol use. She reports that she does not use drugs.  Allergies  Allergen Reactions  . Codeine Nausea And Vomiting  . Demerol  [Meperidine] Other (See Comments)    Hallucinations   . Metoprolol Other (See Comments)    nightmares    Family History  Problem Relation Age of Onset  . Stroke Mother   . Kidney disease Mother   . Colon polyps Mother   . Hypertension Mother   . Kidney failure Mother   . Lung cancer Father        stage IV   . Colon polyps Father   . Hypertension Father   . Stomach cancer Maternal Grandfather   . Colon cancer Paternal Grandfather   . Hypertension Sister   . Hyperlipidemia Sister   . Hypertension Brother   . Hyperlipidemia Brother   . Rectal cancer Neg Hx   . Esophageal cancer Neg Hx   . Pancreatic cancer Neg Hx     Prior to Admission medications   Medication Sig Start Date End Date Taking? Authorizing Provider  ALPRAZolam (XANAX) 0.5 MG tablet Take 0.25 mg by mouth 2 (two) times daily as needed for anxiety or sleep. 1./-1 tablet 2-3 times a day     [provider]  amphetamine-dextroamphetamine (ADDERALL) 30 MG tablet Take 30-60 mg by mouth daily.    [provider]  ARIPiprazole (ABILIFY) 15 MG tablet Take 15 mg by mouth daily.  Patient not taking: Reported on 11/06/2020 11/12/19   [provider]  aspirin EC 81 MG tablet Take 1 tablet (81 mg total) by mouth daily. For heart health Patient not taking: Reported on 11/06/2020 02/09/17   Armandina StammerNwoko, Agnes I, NP  bisoprolol (ZEBETA) 5 MG tablet Take 1 tablet (5 mg total) by mouth daily. Patient not taking: Reported on 11/06/2020 08/14/20   Chilton Siandolph, Tiffany, MD  divalproex (DEPAKOTE) 250 MG DR tablet Take 250 mg by mouth. Taking 3 at [facility] Patient not taking: Reported on 11/06/2020    [provider]  doxazosin (CARDURA) 1 MG tablet Take 1 tablet (1 mg total) by mouth at bedtime. Patient not taking: Reported on 11/06/2020 08/14/20   Chilton Siandolph, Tiffany, MD  fenofibrate (TRICOR) 145 MG tablet Take 145 mg by mouth daily.  Patient not taking: Reported on 11/06/2020 03/14/19   [provider]    flecainide (TAMBOCOR) 50 MG tablet Take 50 mg by mouth once. Pt taking half a tablet daily. Patient not taking: Reported on 11/06/2020    [provider]  folic acid (FOLVITE) 1 MG tablet Take 1 mg by mouth daily. Patient not taking: Reported on 11/06/2020 12/25/19   [provider]  glipiZIDE (GLUCOTROL XL) 10 MG 24 hr tablet Take 5 mg by mouth daily with breakfast.  Patient not taking: Reported on 11/06/2020 05/25/17   [provider]  losartan (COZAAR) 25 MG tablet Take 1 tablet (25 mg total) by mouth  daily. Patient not taking: Reported on 11/06/2020 09/11/20 12/10/20  Chilton Si, MD  magnesium gluconate (MAGONATE) 500 MG tablet Take 500 mg by mouth daily.    [provider]  metFORMIN (GLUCOPHAGE) 1000 MG tablet Take 1,000 mg by mouth 2 (two) times daily. Patient not taking: Reported on 11/06/2020 12/25/19   [provider]  Multiple Vitamins-Minerals (CENTROVITE) TABS Take 1 tablet by mouth daily. Patient not taking: Reported on 11/06/2020 04/25/18   [provider]  omeprazole (PRILOSEC) 40 MG capsule Take 40 mg by mouth 2 (two) times daily.    [provider]  QUEtiapine (SEROQUEL) 100 MG tablet Take 100 mg by mouth at bedtime. 1-2 tablet s at bedtime    [provider]  rOPINIRole (REQUIP) 0.5 MG tablet Take 0.5 mg by mouth at bedtime.    [provider]  thiamine 100 MG tablet Take 1 tablet (100 mg total) by mouth daily. For low thiamine Patient not taking: Reported on 11/06/2020 02/09/17   Armandina Stammer I, NP  vitamin E 180 MG (400 UNITS) capsule Take 1 capsule by mouth daily. Patient not taking: Reported on 11/06/2020    [provider]    Physical Exam: Vitals:   11/29/20 2300 11/30/20 0000 11/30/20 0100 11/30/20 0130  BP: (!) 171/115 (!) 170/109 (!) 164/112 (!) 169/116  Pulse: 76 71 76 77  Resp: 19 14 16 15   Temp:      TempSrc:      SpO2: 91% 95% 92% 92%  Weight:      Height:         Constitutional: NAD, calm, comfortable Vitals:   11/29/20 2300 11/30/20 0000 11/30/20 0100 11/30/20 0130  BP: (!) 171/115 (!) 170/109 (!) 164/112 (!) 169/116  Pulse: 76 71 76 77  Resp: 19 14 16 15   Temp:      TempSrc:      SpO2: 91% 95% 92% 92%  Weight:      Height:       Eyes: PERRL, lids and conjunctivae normal ENMT: Mucous membranes are moist. Posterior pharynx clear of any exudate or lesions.Normal dentition.  Neck: normal, supple, no masses, no thyromegaly Respiratory: clear to auscultation bilaterally, no wheezing, no crackles. Normal respiratory effort. No accessory muscle use.  Cardiovascular: Regular rate and rhythm, no murmurs / rubs / gallops. No extremity edema. 2+ pedal pulses. No carotid bruits.  Abdomen: +TTP epigastric area and RUQ and LUQ. No guarding/rebound.  Musculoskeletal: no clubbing / cyanosis. No joint deformity upper and lower extremities. Good ROM, no contractures. Normal muscle tone.  Skin: no rashes, lesions, ulcers. No induration Neurologic: CN 2-12 grossly intact. Sensation intact, DTR normal. Strength 5/5 in all 4.  Psychiatric: Normal judgment and insight. Alert and oriented x 3. Normal mood.    Labs on Admission: I have personally reviewed following labs and imaging studies  CBC: Recent Labs  Lab 11/29/20 1613  WBC 7.1  HGB 16.6*  HCT 47.1*  MCV 94.4  PLT 268    Basic Metabolic Panel: Recent Labs  Lab 11/29/20 1613  NA 128*  K 3.9  CL 90*  CO2 24  GLUCOSE 479*  BUN 19  CREATININE 0.91  CALCIUM 9.4    GFR: Estimated Creatinine Clearance: 70.5 mL/min (by C-G formula based on SCr of 0.91 mg/dL).  Liver Function Tests: Recent Labs  Lab 11/29/20 1613  AST 26  ALT 42  ALKPHOS 119  BILITOT 1.0  PROT 7.8  ALBUMIN 4.8    Urine analysis:  Component Value Date/Time   COLORURINE YELLOW 11/29/2020 1851   APPEARANCEUR CLEAR 11/29/2020 1851   LABSPEC 1.039 (H) 11/29/2020 1851   PHURINE 5.0 11/29/2020 1851   GLUCOSEU  >=500 (A) 11/29/2020 1851   HGBUR NEGATIVE 11/29/2020 1851   BILIRUBINUR NEGATIVE 11/29/2020 1851   KETONESUR 20 (A) 11/29/2020 1851   PROTEINUR NEGATIVE 11/29/2020 1851   UROBILINOGEN 0.2 10/27/2015 1805   NITRITE NEGATIVE 11/29/2020 1851   LEUKOCYTESUR NEGATIVE 11/29/2020 1851    Radiological Exams on Admission: CT ABDOMEN PELVIS W CONTRAST  Result Date: 11/29/2020 CLINICAL DATA:  Abdominal pain EXAM: CT ABDOMEN AND PELVIS WITH CONTRAST TECHNIQUE: Multidetector CT imaging of the abdomen and pelvis was performed using the standard protocol following bolus administration of intravenous contrast. CONTRAST:  OMNIPAQUE IOHEXOL 300 MG/ML  SOLN COMPARISON:  September 15, 2016 FINDINGS: Lower chest: The visualized heart size within normal limits. No pericardial fluid/thickening. No hiatal hernia. The visualized portions of the lungs are clear. Hepatobiliary: There is diffuse low density seen throughout the liver parenchyma with mild hepatomegaly. The main portal vein is patent. No evidence of calcified gallstones, gallbladder wall thickening or biliary dilatation. Pancreas: There is increased fat stranding changes seen around the pancreas diffusely. No peripancreatic fluid collections or evidence of necrosis is seen. Spleen: Normal in size without focal abnormality. Adrenals/Urinary Tract: Both adrenal glands appear normal. The kidneys and collecting system appear normal without evidence of urinary tract calculus or hydronephrosis. Bladder is unremarkable. Stomach/Bowel: The stomach, are normal in appearance. There is mildly increased stranding changes seen around the duodenum. The remainder of the small bowel is unremarkable. Scattered colonic diverticula are noted. Vascular/Lymphatic: There are no enlarged mesenteric, retroperitoneal, or pelvic lymph nodes. No significant vascular findings are present. Reproductive: The patient is status post hysterectomy. No adnexal masses or collections seen. Other:  No evidence of abdominal wall mass or hernia. Musculoskeletal: No acute or significant osseous findings. IMPRESSION: 1. Findings suggestive mild acute pancreatitis. No peripancreatic fluid collections or evidence of pancreatic necrosis. 2. Hepatic steatosis and mild hepatomegaly. Electronically Signed   By: Jonna Clark M.D.   On: 11/29/2020 19:45   DG Chest Portable 1 View  Result Date: 11/29/2020 CLINICAL DATA:  Epigastric pain for 1 week, nausea EXAM: PORTABLE CHEST 1 VIEW COMPARISON:  10/29/2020 FINDINGS: Single frontal view of the chest demonstrates stable dual lead pacemaker. Cardiac silhouette is unremarkable. No airspace disease, effusion, or pneumothorax. IMPRESSION: 1. No acute intrathoracic process. Electronically Signed   By: Sharlet Salina M.D.   On: 11/29/2020 19:11    EKG: Independently reviewed. Movement artifact. NSR with rate of 96  Assessment/Plan Principal Problem:   Acute pancreatitis -likely secondary to alcohol. CT confirmed mild pancreatitis. No evidence of necrosis or fluid collections.  -checking lipid panel  -NPO except for ice chips and IVF with NS at 125cc/hour  -watch urine output -pain control with advil, (unsure if she can take norco) and then IV dilaudid prn. She states morphine didn't control her pain. Transition to oral hopefully tomorrow.  -advance diet as tolerated   Active Problems:   Anxiety -continue home medication   Complete heart block (HCC) with pacemaker  -tele monitoring -troponin negative -one 25mg  of flecainide -followed by dr. -TSH recently checked and wnl    Type 2 diabetes mellitus with hyperglycemia, without long-term current use of insulin  -a1c pending -stopped all home meds on her own (glipizide and metformin) -restarting glipizide. Hold metformin and starting SSI. No evidence of DKA -checking accuchecks q  1hour with IVF. No anion gap -diabetic education -sodium corrects.     Hypertension -followed at HTN clinic with  Dr. Duke Salvia -possibly elevated even more due to pain.  -has just gotten back to taking medication and did not start the cozaar Dr. Duke Salvia ordered a few months ago. -continue home meds of cozaar , cardura  and zebeta /day. May need some titration but has history of dysautonomia.     MDD (major depressive disorder), recurrent severe, without psychosis (HCC) -had weaned off a lot of her medication -continue her seroquel. She apparently stopped taking her abilify and depakote . -followed by pcp outpatient  Bipolar -stopped some of her medication. On seroquel. May need to start back abilify/depakote.     Tobacco abuse -smokes 1PPD -nicotine patch and smoking cessation counseling    DVT prophylaxis: lovenox  Code Status:   Full   Disposition Plan:   Patient is from:  home  Anticipated DC to:  Home   Anticipated DC date:  2 days     Consults called:  none  Admission status:  Observation     Orland Mustard MD [facility]  How to contact the [facility] Attending or Consulting provider 7A - 7P or covering provider during after hours 7P -7A, for this patient?   1. Check the care team in [facility] and look for a) attending/consulting [facility] provider listed and b) the [facility] team listed 2. Log into [email or web address] and use [place]'s universal password to access. If you do not have the password, please contact the hospital operator. 3. Locate the [facility] provider you are looking for under [facility] and page to a number that you can be directly reached. 4. If you still have difficulty reaching the provider, please page the [facility] (Director on Call) for the Hospitalists listed on amion for assistance.  11/30/2020, 1:59 AM

## 2020-12-01 ENCOUNTER — Encounter (HOSPITAL_COMMUNITY): Payer: Self-pay | Admitting: Family Medicine

## 2020-12-01 DIAGNOSIS — F909 Attention-deficit hyperactivity disorder, unspecified type: Secondary | ICD-10-CM | POA: Diagnosis not present

## 2020-12-01 DIAGNOSIS — I442 Atrioventricular block, complete: Secondary | ICD-10-CM | POA: Diagnosis not present

## 2020-12-01 DIAGNOSIS — K852 Alcohol induced acute pancreatitis without necrosis or infection: Secondary | ICD-10-CM | POA: Diagnosis not present

## 2020-12-01 DIAGNOSIS — F419 Anxiety disorder, unspecified: Secondary | ICD-10-CM | POA: Diagnosis not present

## 2020-12-01 LAB — COMPREHENSIVE METABOLIC PANEL
ALT: 25 U/L (ref 0–44)
AST: 22 U/L (ref 15–41)
Albumin: 3.3 g/dL — ABNORMAL LOW (ref 3.5–5.0)
Alkaline Phosphatase: 88 U/L (ref 38–126)
Anion gap: 10 (ref 5–15)
BUN: 9 mg/dL (ref 6–20)
CO2: 25 mmol/L (ref 22–32)
Calcium: 8.6 mg/dL — ABNORMAL LOW (ref 8.9–10.3)
Chloride: 105 mmol/L (ref 98–111)
Creatinine, Ser: 0.66 mg/dL (ref 0.44–1.00)
GFR, Estimated: >60 mL/min (ref >60)
Glucose, Bld: 136 mg/dL — ABNORMAL HIGH (ref 70–99)
Potassium: 3.7 mmol/L (ref 3.5–5.1)
Sodium: 140 mmol/L (ref 135–145)
Total Bilirubin: 0.8 mg/dL (ref 0.3–1.2)
Total Protein: 5.9 g/dL — ABNORMAL LOW (ref 6.5–8.1)

## 2020-12-01 LAB — CBC
HCT: 40.4 % (ref 36.0–46.0)
Hemoglobin: 13.7 g/dL (ref 12.0–15.0)
MCH: 33.2 pg (ref 26.0–34.0)
MCHC: 33.9 g/dL (ref 30.0–36.0)
MCV: 97.8 fL (ref 80.0–100.0)
Platelets: 196 10*3/uL (ref 150–400)
RBC: 4.13 MIL/uL (ref 3.87–5.11)
RDW: 12.2 % (ref 11.5–15.5)
WBC: 4.3 10*3/uL (ref 4.0–10.5)
nRBC: 0 % (ref 0.0–0.2)

## 2020-12-01 LAB — GLUCOSE, CAPILLARY
Glucose-Capillary: 254 mg/dL — ABNORMAL HIGH (ref 70–99)
Glucose-Capillary: 107 mg/dL — ABNORMAL HIGH (ref 70–99)
Glucose-Capillary: 270 mg/dL — ABNORMAL HIGH (ref 70–99)

## 2020-12-01 LAB — MAGNESIUM: Magnesium: 1.9 mg/dL (ref 1.7–2.4)

## 2020-12-01 LAB — LIPASE, BLOOD: Lipase: 33 U/L (ref 11–51)

## 2020-12-01 LAB — PHOSPHORUS: Phosphorus: 3 mg/dL (ref 2.5–4.6)

## 2020-12-01 MED ORDER — OXYCODONE-ACETAMINOPHEN 5-325 MG PO TABS
1.0000 | ORAL_TABLET | Freq: Four times a day (QID) | ORAL | Status: DC | PRN
  Administered 2020-12-01 – 2020-12-02 (×3): 1 via ORAL
  Filled 2020-12-01 (×3): qty 1

## 2020-12-01 MED ORDER — HYDROMORPHONE HCL 1 MG/ML IJ SOLN
0.5000 mg | INTRAMUSCULAR | Status: DC | PRN
  Administered 2020-12-01 – 2020-12-02 (×3): 0.5 mg via INTRAVENOUS
  Filled 2020-12-01 (×4): qty 0.5

## 2020-12-01 MED ORDER — OXYCODONE-ACETAMINOPHEN 5-325 MG PO TABS: 1.0000 | ORAL_TABLET | Freq: Four times a day (QID) | ORAL | 0 refills | Status: DC | PRN

## 2020-12-01 MED ORDER — FLECAINIDE ACETATE 50 MG PO TABS
25.0000 mg | ORAL_TABLET | Freq: Every day | ORAL | Status: DC
  Administered 2020-12-01 – 2020-12-02 (×2): 25 mg via ORAL
  Filled 2020-12-01 (×2): qty 1

## 2020-12-01 MED ORDER — NICOTINE 21 MG/24HR TD PT24
21.0000 mg | MEDICATED_PATCH | Freq: Every day | TRANSDERMAL | 0 refills | Status: DC
Start: 2020-12-02 — End: 2021-05-13

## 2020-12-01 MED ORDER — ONDANSETRON HCL 4 MG PO TABS: 4.0000 mg | ORAL_TABLET | Freq: Every day | ORAL | 0 refills | Status: AC | PRN

## 2020-12-01 NOTE — Progress Notes (Signed)
PROGRESS NOTE  IRJA WHELESS HYW:[id number] DOB: [birth year] DOA: 11/29/2020 PCP: Cheron Schaumann., MD   LOS: 1 day   Brief narrative: As per HPI,  Brittney Cooper is a 59 y.o. female with medical history significant of alcoholic pancreatitis, HTN, type 2 diabetes, depression, anxiety, bipolar, complete heart block with pace maker, dysautonomia and atrial tachycardia followed by Dr. Graciela Husbands (cardiology for her pacemaker) as well as Dr. Duke Salvia for her blood pressure.  Patient presented to the hospital this time with abdominal pain after drinking wine.  See try to clear liquids at home for 1 to 2 days but despite that he continued to have abdominal pain for around 2 weeks prior to presentation.  She was taking Advil and Tylenol without much relief.  In the ED patient was noted to be hypertensive.  Lipase was elevated and CT scan showed mild pancreatitis.  Patient was then admitted to hospital for acute pancreatitis requiring IV narcotics and  fluids.   Assessment/Plan:  Principal Problem:   Acute pancreatitis Active Problems:   Anxiety   Complete heart block (HCC)   MDD (major depressive disorder), recurrent severe, without psychosis (HCC)   Type 2 diabetes mellitus with hyperglycemia, without long-term current use of insulin (HCC)   Hypertension   Tobacco abuse   Acute alcoholic pancreatitis CT scan with mild pancreatitis.  Lipase was elevated at 145 which has trended down to 33 today.  On full liquids.  Will advance to regular diet by dinner.  If tolerates diet by a.m. plan for disposition home.  Anxiety/depressive disorder, MDD.  on Seroquel and Xanax..  Patient has stopped taking Abilify and Depakote.  Will need to follow-up as outpatient.  Complete heart block (HCC) with pacemaker  Continue Tambocor 25 mg daily. followed by Dr. Graciela Husbands, cardiology as outpatient.  Continue bisoprolol  Type 2 diabetes mellitus with hyperglycemia, without long-term current use of insulin Was on  glipizide, Metformin at home. Glipizide has been restarted here.  Continue sliding scale insulin, Accu-Cheks.  Hemoglobin A1c at 9.0.  Closely monitor blood glucose levels.  Hypertension -Continue Cozaar, Cardura and Zebeta.  Follows up with hypertension clinic with Dr. Duke Salvia, as outpatient. We will continue to monitor closely.  She does have dysautonomia so we will need to close titration.   Tobacco use disorder.  Smokes 1 pack/day.  Continue nicotine patch.   DVT prophylaxis: enoxaparin (LOVENOX) injection 40 mg Start: 11/30/20 1800    Code Status: Full code  Family Communication: None  Status is: Inpatient  Remains inpatient appropriate because:IV treatments appropriate due to intensity of illness or inability to take PO and Inpatient level of care appropriate due to severity of illness, advance diet today   Dispo: The patient is from: Home              Anticipated d/c is to: Home              Anticipated d/c date is: 1 day              Patient currently is not medically stable to d/c.  Consultants:  None  Procedures:  None  Antibiotics:  . None  Anti-infectives (From admission, onward)   None     Subjective: Today, patient was seen and examined at bedside.  Has intermittent abdominal pain but no nausea vomiting.  Has tolerated full liquids.  Objective: Vitals:   11/30/20 2041 12/01/20 0545  BP: (!) 158/98 (!) 146/101  Pulse: 63 62  Resp: 18 18  Temp: 98.4 F (36.9 C) 98.2 F (36.8 C)  SpO2: 97% 97%    Intake/Output Summary (Last 24 hours) at 12/01/2020 0757 Last data filed at 12/01/2020 0500 Gross per 24 hour  Intake 3120.18 ml  Output 1100 ml  Net 2020.18 ml   Filed Weights   11/29/20 1543 11/30/20 0640  Weight: 81.6 kg 81.6 kg   Body mass index is 30.9 kg/m.   Physical Exam:  GENERAL: Patient is alert awake and oriented. Not in obvious distress.  Obese HENT: No scleral pallor or icterus. Pupils equally reactive to light. Oral  mucosa is moist NECK: is supple, no gross swelling noted. CHEST: Clear to auscultation. No crackles or wheezes.  Diminished breath sounds bilaterally. CVS: S1 and S2 heard, no murmur. Regular rate and rhythm.  ABDOMEN: Soft, nontender, bowel sounds are present. EXTREMITIES: No edema. CNS: Cranial nerves are intact. No focal motor deficits. SKIN: warm and dry without rashes.  Data Review: I have personally reviewed the following laboratory data and studies,  CBC: Recent Labs  Lab 11/29/20 1613 11/30/20 0809 12/01/20 0448  WBC 7.1 6.2 4.3  HGB 16.6* 14.6 13.7  HCT 47.1* 42.7 40.4  MCV 94.4 96.8 97.8  PLT 268 212 196   Basic Metabolic Panel: Recent Labs  Lab 11/29/20 1613 11/30/20 0809 12/01/20 0448  NA 128* 133* 140  K 3.9 3.9 3.7  CL 90* 97* 105  CO2 24 26 25   GLUCOSE 479* 281* 136*  BUN 19 14 9   CREATININE 0.91 0.68 0.66  CALCIUM 9.4 8.9 8.6*  MG  --   --  1.9  PHOS  --   --  3.0   Liver Function Tests: Recent Labs  Lab 11/29/20 1613 11/30/20 0809 12/01/20 0448  AST 26 19 22   ALT 42 31 25  ALKPHOS 119 98 88  BILITOT 1.0 0.8 0.8  PROT 7.8 6.7 5.9*  ALBUMIN 4.8 4.0 3.3*   Recent Labs  Lab 11/29/20 1613 12/01/20 0448  LIPASE 145* 33   No results for input(s): AMMONIA in the last 168 hours. Cardiac Enzymes: No results for input(s): CKTOTAL, CKMB, CKMBINDEX, TROPONINI in the last 168 hours. BNP (last 3 results) No results for input(s): BNP in the last 8760 hours.  ProBNP (last 3 results) No results for input(s): PROBNP in the last 8760 hours.  CBG: Recent Labs  Lab 11/30/20 0259 11/30/20 0755 11/30/20 1151 11/30/20 1623 11/30/20 2040  GLUCAP 289* 255* 218* 157* 121*   Recent Results (from the past 240 hour(s))  Resp Panel by RT-PCR (Flu A&B, Covid) Nasopharyngeal Swab     Status: None   Collection Time: 11/29/20 11:45 PM   Specimen: Nasopharyngeal Swab; Nasopharyngeal(NP) swabs in vial transport medium  Result Value Ref Range Status   SARS  Coronavirus 2 by RT PCR NEGATIVE NEGATIVE Final    Comment: (NOTE) SARS-CoV-2 target nucleic acids are NOT DETECTED.  The SARS-CoV-2 RNA is generally detectable in upper respiratory specimens during the acute phase of infection. The lowest concentration of SARS-CoV-2 viral copies this assay can detect is 138 copies/mL. A negative result does not preclude SARS-Cov-2 infection and should not be used as the sole basis for treatment or other patient management decisions. A negative result may occur with  improper specimen collection/handling, submission of specimen other than nasopharyngeal swab, presence of viral mutation(s) within the areas targeted by this assay, and inadequate number of viral copies(<138 copies/mL). A negative result must be combined with clinical observations, patient history, and epidemiological information. The  expected result is Negative.  Fact Sheet for Patients:  BloggerCourse.com  Fact Sheet for Healthcare Providers:  SeriousBroker.it  This test is no t yet approved or cleared by the [place] FDA and  has been authorized for detection and/or diagnosis of SARS-CoV-2 by FDA under an Emergency Use Authorization (EUA). This EUA will remain  in effect (meaning this test can be used) for the duration of the COVID-19 declaration under Section 564(b)(1) of the Act, 21 U.S.C.section 360bbb-3(b)(1), unless the authorization is terminated  or revoked sooner.       Influenza A by PCR NEGATIVE NEGATIVE Final   Influenza B by PCR NEGATIVE NEGATIVE Final    Comment: (NOTE) The Xpert Xpress SARS-CoV-2/FLU/RSV plus assay is intended as an aid in the diagnosis of influenza from Nasopharyngeal swab specimens and should not be used as a sole basis for treatment. Nasal washings and aspirates are unacceptable for Xpert Xpress SARS-CoV-2/FLU/RSV testing.  Fact Sheet for  Patients: BloggerCourse.com  Fact Sheet for Healthcare Providers: SeriousBroker.it  This test is not yet approved or cleared by the [place] FDA and has been authorized for detection and/or diagnosis of SARS-CoV-2 by FDA under an Emergency Use Authorization (EUA). This EUA will remain in effect (meaning this test can be used) for the duration of the COVID-19 declaration under Section 564(b)(1) of the Act, 21 U.S.C. section 360bbb-3(b)(1), unless the authorization is terminated or revoked.  Performed at [facility], [address] [place], [place] [address]      Studies: CT ABDOMEN PELVIS W CONTRAST  Result Date: 11/29/2020 CLINICAL DATA:  Abdominal pain EXAM: CT ABDOMEN AND PELVIS WITH CONTRAST TECHNIQUE: Multidetector CT imaging of the abdomen and pelvis was performed using the standard protocol following bolus administration of intravenous contrast. CONTRAST:  OMNIPAQUE IOHEXOL 300 MG/ML  SOLN COMPARISON:  September 15, 2016 FINDINGS: Lower chest: The visualized heart size within normal limits. No pericardial fluid/thickening. No hiatal hernia. The visualized portions of the lungs are clear. Hepatobiliary: There is diffuse low density seen throughout the liver parenchyma with mild hepatomegaly. The main portal vein is patent. No evidence of calcified gallstones, gallbladder wall thickening or biliary dilatation. Pancreas: There is increased fat stranding changes seen around the pancreas diffusely. No peripancreatic fluid collections or evidence of necrosis is seen. Spleen: Normal in size without focal abnormality. Adrenals/Urinary Tract: Both adrenal glands appear normal. The kidneys and collecting system appear normal without evidence of urinary tract calculus or hydronephrosis. Bladder is unremarkable. Stomach/Bowel: The stomach, are normal in appearance. There is mildly increased stranding changes seen around  the duodenum. The remainder of the small bowel is unremarkable. Scattered colonic diverticula are noted. Vascular/Lymphatic: There are no enlarged mesenteric, retroperitoneal, or pelvic lymph nodes. No significant vascular findings are present. Reproductive: The patient is status post hysterectomy. No adnexal masses or collections seen. Other: No evidence of abdominal wall mass or hernia. Musculoskeletal: No acute or significant osseous findings. IMPRESSION: 1. Findings suggestive mild acute pancreatitis. No peripancreatic fluid collections or evidence of pancreatic necrosis. 2. Hepatic steatosis and mild hepatomegaly. Electronically Signed   By: Jonna Clark M.D.   On: 11/29/2020 19:45   DG Chest Portable 1 View  Result Date: 11/29/2020 CLINICAL DATA:  Epigastric pain for 1 week, nausea EXAM: PORTABLE CHEST 1 VIEW COMPARISON:  10/29/2020 FINDINGS: Single frontal view of the chest demonstrates stable dual lead pacemaker. Cardiac silhouette is unremarkable. No airspace disease, effusion, or pneumothorax. IMPRESSION: 1. No acute intrathoracic process. Electronically Signed  By: Sharlet Salina M.D.   On: 11/29/2020 19:11      Joycelyn Das, MD  [facility] 12/01/2020

## 2020-12-01 NOTE — Plan of Care (Signed)
  Problem: Health Behavior/Discharge Planning: Goal: Ability to manage health-related needs will improve Outcome: Progressing   Problem: Pain Managment: Goal: General experience of comfort will improve Outcome: Progressing   

## 2020-12-02 DIAGNOSIS — F419 Anxiety disorder, unspecified: Secondary | ICD-10-CM | POA: Diagnosis not present

## 2020-12-02 DIAGNOSIS — K852 Alcohol induced acute pancreatitis without necrosis or infection: Secondary | ICD-10-CM | POA: Diagnosis not present

## 2020-12-02 DIAGNOSIS — I442 Atrioventricular block, complete: Secondary | ICD-10-CM | POA: Diagnosis not present

## 2020-12-02 DIAGNOSIS — F909 Attention-deficit hyperactivity disorder, unspecified type: Secondary | ICD-10-CM | POA: Diagnosis not present

## 2020-12-02 LAB — GLUCOSE, CAPILLARY: Glucose-Capillary: 252 mg/dL — ABNORMAL HIGH (ref 70–99)

## 2020-12-02 NOTE — Progress Notes (Signed)
RN at bedside to tell pt that CNA was on her way to take her out to the car. Pt is standing up in the room, stripped her bed linen, and is washing off her white board. Pt voiced appreciation. No further needs. Pt being wheeled out by nursing staff.

## 2020-12-02 NOTE — Progress Notes (Signed)
Rn reviewed discharge paperwork with patient. All questions addressed regarding follow-up care and medications. IV removed. Telemetry monitor removed. All belonging (macbook, chargers, cell phone, planner, glasses, clothing) packed up and sent with pt. Pt reports she drove herself here and will be driving herself back home. No further needs at this time.

## 2020-12-02 NOTE — Discharge Summary (Signed)
Physician Discharge Summary  Brittney Cooper FYB:[id number] DOB: 30-Oct-1961 DOA: 11/29/2020  PCP: Cheron Schaumann., MD  Admit date: 11/29/2020 Discharge date: 12/02/2020  Admitted From: Home  Discharge disposition: home    Recommendations for Outpatient Follow-Up:   . Follow up with your primary care provider in one week.  . Check CBC, BMP, magnesium in the next visit . Follow-up with your GI physician as has been scheduled by you for further evaluation of your GI symptoms.   Discharge Diagnosis:   Principal Problem:   Acute pancreatitis Active Problems:   Anxiety   Complete heart block (HCC)   MDD (major depressive disorder), recurrent severe, without psychosis (HCC)   Type 2 diabetes mellitus with hyperglycemia, without long-term current use of insulin (HCC)   Hypertension   Tobacco abuse   Discharge Condition: Improved.  Diet recommendation: Low sodium, heart healthy.  Low-fat diet  Wound care: None.  Code status: Full.   History of Present Illness:  Dola Lunsford Amicois a 59 y.o.femalewith medical history significant ofalcoholic pancreatitis, HTN, type 2 diabetes, depression, anxiety, bipolar, complete heart block with pace maker, dysautonomia and atrial tachycardia followed by Dr. Graciela Husbands (cardiology for her pacemaker) as well as Dr. Duke Salvia for her blood pressure.  Patient presented to the hospital this time with abdominal pain after drinking wine.  See try to clear liquids at home for 1 to 2 days but despite that he continued to have abdominal pain for around 2 weeks prior to presentation.  She was taking Advil and Tylenol without much relief.  In the ED patient was noted to be hypertensive.  Lipase was elevated and CT scan showed mild pancreatitis.  Patient was then admitted to hospital for acute pancreatitis requiring IV narcotics and  fluids.   Hospital Course:   Following conditions were addressed during hospitalization as listed below,   Acutealcoholicpancreatitis CT scan with mild pancreatitis.Lipase was elevated at 145 which trended down and normalized.  Patient was gradually advanced on her diet which she tolerated.  Was advised to abstain from alcohol.  Advised a low-fat diet on discharge  Anxiety/depressive disorder, MDD.  on [place] Xanax, Abilify and Depakote as per med list.. Will need to follow-up as outpatient.  Complete heart block (HCC) with pacemaker Continue Tambocor 25 mg daily. Followed by Dr. Klein,cardiologyas outpatient.Continue bisoprolol  Type 2 diabetes mellitus with hyperglycemia, without long-term current use of insulin Was on glipizide, Metformin at home. Hemoglobin A1c at 9.0.    Hypertension -Continue Cozaar,Cardura and Zebeta. Follows up with hypertension clinic with Dr. Duke Salvia, as outpatient.  Tobaccouse disorder. Smokes 1 pack/day.  Counseled about it.  Disposition.  At this time, patient is stable for disposition home.  She was advised to follow-up with her primary care physician and GI as above  Medical Consultants:    None.  Procedures:   None Subjective:   Today, patient was seen and examined at bedside.  Has tolerated oral diet without any symptoms.  Discharge Exam:   Vitals:   12/01/20 2249 12/02/20 0546  BP: 119/88 (!) 138/92  Pulse: 77 72  Resp: 20 16  Temp: 98.4 F (36.9 C) 98 F (36.7 C)  SpO2: 95% 97%   Vitals:   12/01/20 0545 12/01/20 1312 12/01/20 2249 12/02/20 0546  BP: (!) 146/101 (!) 148/107 119/88 (!) 138/92  Pulse: 62 79 77 72  Resp: 18 18 20 16   Temp: 98.2 F (36.8 C) 98.5 F (36.9 C) 98.4 F (36.9 C) 98 F (36.7 C)  TempSrc: Oral Oral Oral   SpO2: 97% 97% 95% 97%  Weight:      Height:       General: Alert awake, not in obvious distress, obese HENT: pupils equally reacting to light,  No scleral pallor or icterus noted. Oral mucosa is moist.  Chest:  Clear breath sounds.  Diminished breath sounds  bilaterally. No crackles or wheezes.  CVS: S1 &S2 heard. No murmur.  Regular rate and rhythm. Abdomen: Soft, nontender, nondistended.  Bowel sounds are heard.   Extremities: No cyanosis, clubbing or edema.  Peripheral pulses are palpable. Psych: Alert, awake and oriented, normal mood CNS:  No cranial nerve deficits.  Power equal in all extremities.   Skin: Warm and dry.  No rashes noted.  The results of significant diagnostics from this hospitalization (including imaging, microbiology, ancillary and laboratory) are listed below for reference.     Diagnostic Studies:   CT ABDOMEN PELVIS W CONTRAST  Result Date: 11/29/2020 CLINICAL DATA:  Abdominal pain EXAM: CT ABDOMEN AND PELVIS WITH CONTRAST TECHNIQUE: Multidetector CT imaging of the abdomen and pelvis was performed using the standard protocol following bolus administration of intravenous contrast. CONTRAST:  OMNIPAQUE IOHEXOL 300 MG/ML  SOLN COMPARISON:  September 15, 2016 FINDINGS: Lower chest: The visualized heart size within normal limits. No pericardial fluid/thickening. No hiatal hernia. The visualized portions of the lungs are clear. Hepatobiliary: There is diffuse low density seen throughout the liver parenchyma with mild hepatomegaly. The main portal vein is patent. No evidence of calcified gallstones, gallbladder wall thickening or biliary dilatation. Pancreas: There is increased fat stranding changes seen around the pancreas diffusely. No peripancreatic fluid collections or evidence of necrosis is seen. Spleen: Normal in size without focal abnormality. Adrenals/Urinary Tract: Both adrenal glands appear normal. The kidneys and collecting system appear normal without evidence of urinary tract calculus or hydronephrosis. Bladder is unremarkable. Stomach/Bowel: The stomach, are normal in appearance. There is mildly increased stranding changes seen around the duodenum. The remainder of the small bowel is unremarkable. Scattered colonic  diverticula are noted. Vascular/Lymphatic: There are no enlarged mesenteric, retroperitoneal, or pelvic lymph nodes. No significant vascular findings are present. Reproductive: The patient is status post hysterectomy. No adnexal masses or collections seen. Other: No evidence of abdominal wall mass or hernia. Musculoskeletal: No acute or significant osseous findings. IMPRESSION: 1. Findings suggestive mild acute pancreatitis. No peripancreatic fluid collections or evidence of pancreatic necrosis. 2. Hepatic steatosis and mild hepatomegaly. Electronically Signed   By: Jonna Clark M.D.   On: 11/29/2020 19:45   DG Chest Portable 1 View  Result Date: 11/29/2020 CLINICAL DATA:  Epigastric pain for 1 week, nausea EXAM: PORTABLE CHEST 1 VIEW COMPARISON:  10/29/2020 FINDINGS: Single frontal view of the chest demonstrates stable dual lead pacemaker. Cardiac silhouette is unremarkable. No airspace disease, effusion, or pneumothorax. IMPRESSION: 1. No acute intrathoracic process. Electronically Signed   By: Sharlet Salina M.D.   On: 11/29/2020 19:11     Labs:   Basic Metabolic Panel: Recent Labs  Lab 11/29/20 1613 11/29/20 1613 11/30/20 0809 12/01/20 0448  NA 128*  --  133* 140  K 3.9   < > 3.9 3.7  CL 90*  --  97* 105  CO2 24  --  26 25  GLUCOSE 479*  --  281* 136*  BUN 19  --  14 9  CREATININE 0.91  --  0.68 0.66  CALCIUM 9.4  --  8.9 8.6*  MG  --   --   --  1.9  PHOS  --   --   --  3.0   < > = values in this interval not displayed.   GFR Estimated Creatinine Clearance: 80.2 mL/min (by C-G formula based on SCr of 0.66 mg/dL). Liver Function Tests: Recent Labs  Lab 11/29/20 1613 11/30/20 0809 12/01/20 0448  AST 26 19 22   ALT 42 31 25  ALKPHOS 119 98 88  BILITOT 1.0 0.8 0.8  PROT 7.8 6.7 5.9*  ALBUMIN 4.8 4.0 3.3*   Recent Labs  Lab 11/29/20 1613 12/01/20 0448  LIPASE 145* 33   No results for input(s): AMMONIA in the last 168 hours. Coagulation profile No results for  input(s): INR, PROTIME in the last 168 hours.  CBC: Recent Labs  Lab 11/29/20 1613 11/30/20 0809 12/01/20 0448  WBC 7.1 6.2 4.3  HGB 16.6* 14.6 13.7  HCT 47.1* 42.7 40.4  MCV 94.4 96.8 97.8  PLT 268 212 196   Cardiac Enzymes: No results for input(s): CKTOTAL, CKMB, CKMBINDEX, TROPONINI in the last 168 hours. BNP: Invalid input(s): POCBNP CBG: Recent Labs  Lab 11/30/20 2040 12/01/20 1306 12/01/20 1624 12/01/20 2157 12/02/20 0751  GLUCAP 121* 254* 107* 270* 252*   D-Dimer No results for input(s): DDIMER in the last 72 hours. Hgb A1c Recent Labs    11/30/20 0809  HGBA1C 9.0*   Lipid Profile Recent Labs    11/30/20 0809  CHOL 212*  HDL 48  LDLCALC 138*  TRIG 129  CHOLHDL 4.4   Thyroid function studies No results for input(s): TSH, T4TOTAL, T3FREE, THYROIDAB in the last 72 hours.  Invalid input(s): FREET3 Anemia work up No results for input(s): VITAMINB12, FOLATE, FERRITIN, TIBC, IRON, RETICCTPCT in the last 72 hours. Microbiology Recent Results (from the past 240 hour(s))  Resp Panel by RT-PCR (Flu A&B, Covid) Nasopharyngeal Swab     Status: None   Collection Time: 11/29/20 11:45 PM   Specimen: Nasopharyngeal Swab; Nasopharyngeal(NP) swabs in vial transport medium  Result Value Ref Range Status   SARS Coronavirus 2 by RT PCR NEGATIVE NEGATIVE Final    Comment: (NOTE) SARS-CoV-2 target nucleic acids are NOT DETECTED.  The SARS-CoV-2 RNA is generally detectable in upper respiratory specimens during the acute phase of infection. The lowest concentration of SARS-CoV-2 viral copies this assay can detect is 138 copies/mL. A negative result does not preclude SARS-Cov-2 infection and should not be used as the sole basis for treatment or other patient management decisions. A negative result may occur with  improper specimen collection/handling, submission of specimen other than nasopharyngeal swab, presence of viral mutation(s) within the areas targeted by  this assay, and inadequate number of viral copies(<138 copies/mL). A negative result must be combined with clinical observations, patient history, and epidemiological information. The expected result is Negative.  Fact Sheet for Patients:  14/04/21  Fact Sheet for Healthcare Providers:  BloggerCourse.com  This test is no t yet approved or cleared by the [place] FDA and  has been authorized for detection and/or diagnosis of SARS-CoV-2 by FDA under an Emergency Use Authorization (EUA). This EUA will remain  in effect (meaning this test can be used) for the duration of the COVID-19 declaration under Section 564(b)(1) of the Act, 21 U.S.C.section 360bbb-3(b)(1), unless the authorization is terminated  or revoked sooner.       Influenza A by PCR NEGATIVE NEGATIVE Final   Influenza B by PCR NEGATIVE NEGATIVE Final    Comment: (NOTE) The Xpert Xpress SARS-CoV-2/FLU/RSV plus assay is intended as an  aid in the diagnosis of influenza from Nasopharyngeal swab specimens and should not be used as a sole basis for treatment. Nasal washings and aspirates are unacceptable for Xpert Xpress SARS-CoV-2/FLU/RSV testing.  Fact Sheet for Patients: BloggerCourse.com  Fact Sheet for Healthcare Providers: [employer]ious[employer]ker.it  This test is not yet approved or cleared by the [place] FDA and has been authorized for detection and/or diagnosis of SARS-CoV-2 by FDA under an Emergency Use Authorization (EUA). This EUA will remain in effect (meaning this test can be used) for the duration of the COVID-19 declaration under Section 564(b)(1) of the Act, 21 U.S.C. section 360bbb-3(b)(1), unless the authorization is terminated or revoked.  Performed at [facility], [address], [place], [place] [address]      Discharge Instructions:   Discharge Instructions    Diet  - low sodium heart healthy   Complete by: As directed    Discharge instructions   Complete by: As directed    Follow-up with your primary care physician in 1 week.  Follow-up with GI as outpatient for your abdominal symptoms.  Low-fat diet no alcohol.   Increase activity slowly   Complete by: As directed      Allergies as of 12/02/2020      Reactions   Codeine Nausea And Vomiting   Demerol [meperidine] Other (See Comments)   Hallucinations   Metoprolol Other (See Comments)   nightmares      Medication List    TAKE these medications   ALPRAZolam 0.5 MG tablet Commonly known as: XANAX Take 1 mg by mouth at bedtime.   amphetamine-dextroamphetamine 30 MG tablet Commonly known as: ADDERALL Take 30-60 mg by mouth daily.   ARIPiprazole 15 MG tablet Commonly known as: ABILIFY Take 15 mg by mouth daily.   aspirin EC 81 MG tablet Take 1 tablet (81 mg total) by mouth daily. For heart health   bisoprolol 5 MG tablet Commonly known as: ZEBETA Take 1 tablet (5 mg total) by mouth daily.   divalproex 250 MG DR tablet Commonly known as: DEPAKOTE Take 250 mg by mouth. Taking 3 at QHS   doxazosin 1 MG tablet Commonly known as: Cardura Take 1 tablet (1 mg total) by mouth at bedtime.   flecainide 50 MG tablet Commonly known as: TAMBOCOR Take 25 mg by mouth daily.   glipiZIDE 10 MG 24 hr tablet Commonly known as: GLUCOTROL XL Take 5 mg by mouth daily with breakfast.   ibuprofen 200 MG tablet Commonly known as: ADVIL Take 400 mg by mouth in the morning and at bedtime.   losartan 25 MG tablet Commonly known as: COZAAR Take 1 tablet (25 mg total) by mouth daily.   magnesium gluconate 500 MG tablet Commonly known as: MAGONATE Take 500 mg by mouth daily.   metFORMIN 1000 MG tablet Commonly known as: GLUCOPHAGE Take 1,000 mg by mouth 2 (two) times daily.   nicotine 21 mg/24hr patch Commonly known as: NICODERM CQ - dosed in mg/24 hours Place 1 patch (21 mg total) onto the  skin daily.   omeprazole 40 MG capsule Commonly known as: PRILOSEC Take 40 mg by mouth 2 (two) times daily.   ondansetron 4 MG tablet Commonly known as: Zofran Take 1 tablet (4 mg total) by mouth daily as needed for nausea or vomiting.   oxyCODONE-acetaminophen 5-325 MG tablet Commonly known as: PERCOCET/ROXICET Take 1 tablet by mouth every 6 (six) hours as needed for up to 10 doses for moderate pain or severe pain.  QUEtiapine 100 MG tablet Commonly known as: SEROQUEL Take 200 mg by mouth at bedtime.   rOPINIRole 0.5 MG tablet Commonly known as: REQUIP Take 0.5 mg by mouth at bedtime.   thiamine 100 MG tablet Take 1 tablet (100 mg total) by mouth daily. For low thiamine         Time coordinating discharge: 39 minutes  Signed:  Junior Huezo  [facility] 12/02/2020, 9:12 AM

## 2020-12-02 NOTE — Progress Notes (Signed)
RN at bedside to check on patient and her discharging since I have already reviewed d/c instructions. Pt reports that she was going to leave around 1230pm and that she just took her home Adderall in the room and she went to to sleep. She reports her Adderall "takes 45 minutes to kick in." This RN informed pt that she should not be taking her home medications while in the hospital until she gets home. Pt voiced understanding and apologized.

## 2021-01-07 ENCOUNTER — Ambulatory Visit (INDEPENDENT_AMBULATORY_CARE_PROVIDER_SITE_OTHER): Payer: Medicare Other

## 2021-01-07 DIAGNOSIS — R55 Syncope and collapse: Secondary | ICD-10-CM | POA: Diagnosis not present

## 2021-01-07 LAB — CUP PACEART REMOTE DEVICE CHECK
Date Time Interrogation Session: 20220112074919
Implantable Lead Implant Date: 20171017
Implantable Lead Implant Date: 20171017
Implantable Lead Location: 753859
Implantable Lead Location: 753860
Implantable Lead Model: 377
Implantable Lead Model: 377
Implantable Lead Serial Number: 49593512
Implantable Lead Serial Number: 49620910
Implantable Pulse Generator Implant Date: 20171017
Pulse Gen Model: 394969
Pulse Gen Serial Number: 68784192

## 2021-01-20 NOTE — Progress Notes (Signed)
Remote pacemaker transmission.   

## 2021-03-10 ENCOUNTER — Other Ambulatory Visit (HOSPITAL_COMMUNITY): Payer: Self-pay | Admitting: Physician Assistant

## 2021-03-10 DIAGNOSIS — M25562 Pain in left knee: Secondary | ICD-10-CM

## 2021-03-13 ENCOUNTER — Telehealth: Payer: Self-pay

## 2021-03-13 NOTE — Telephone Encounter (Signed)
Spoke with pt, advised of procedure for having an MRI with implanted device.  There is a form that the MRI facility sends to [place], this will provide with MD authorizaion for MRI, settings necessary during procedure and contact info for [employer].

## 2021-03-18 ENCOUNTER — Telehealth: Payer: Self-pay

## 2021-03-18 NOTE — Telephone Encounter (Signed)
Patient called back about her MRI being held up because they never sent [place] a form to fill out for her PPM because she states she needs a rep present. Patient has given me the number to call to see what is the hold up. [phone] and I have called and left a message for Nash Dimmer so they can send [place] a form for her to get a MRI done

## 2021-03-19 ENCOUNTER — Ambulatory Visit (HOSPITAL_COMMUNITY)
Admission: RE | Admit: 2021-03-19 | Discharge: 2021-03-19 | Disposition: A | Discharge status: Home / Self Care | Payer: Medicare Other | Source: Ambulatory Visit | Attending: Physician Assistant | Admitting: Physician Assistant

## 2021-03-19 ENCOUNTER — Other Ambulatory Visit: Payer: Self-pay

## 2021-03-19 DIAGNOSIS — M25562 Pain in left knee: Secondary | ICD-10-CM | POA: Insufficient documentation

## 2021-03-19 NOTE — Telephone Encounter (Signed)
Spoke with patient concerning MRI scheduled for 1300 today. Multiple messages lef with Tresa Endo at MRI with no return call  To obtain fax # to send MRI instructions. Contacted MRI and given fax # to fax MRI order form. Order form faxed to [phone] with confirmation of fax received.

## 2021-04-02 ENCOUNTER — Telehealth: Payer: Self-pay

## 2021-04-02 NOTE — Telephone Encounter (Signed)
[employer] alert received for 1 HVR event logged with duration 8 seconds. 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 Patient reports she was sitting down watching a movie during this time. Reports she was asymptomatic. States she has not been taking her Flecainide 25 mg daily, bisoprolol 5 mg daily, doxazosin 1 mg daily at bedtime. States she has not been compliant for about 2 months. Strongly encouraged patient to take medications as prescribed. Patient agreeable. Advised I will forward to Dr. Graciela Husbands and we will call with any changes.   Patient has in office visit with Dr. Graciela Husbands 05/13/21 @ 2:00 pm.

## 2021-04-02 NOTE — Telephone Encounter (Signed)
Rhtyhm noted  Can we look at printed strips next week  Thanks SK

## 2021-04-08 ENCOUNTER — Ambulatory Visit (INDEPENDENT_AMBULATORY_CARE_PROVIDER_SITE_OTHER): Payer: Medicare Other

## 2021-04-08 DIAGNOSIS — R55 Syncope and collapse: Secondary | ICD-10-CM

## 2021-04-08 DIAGNOSIS — R002 Palpitations: Secondary | ICD-10-CM

## 2021-04-08 DIAGNOSIS — I471 Supraventricular tachycardia: Secondary | ICD-10-CM

## 2021-04-08 DIAGNOSIS — I442 Atrioventricular block, complete: Secondary | ICD-10-CM

## 2021-04-09 LAB — CUP PACEART REMOTE DEVICE CHECK
Date Time Interrogation Session: 20220412064527
Implantable Lead Implant Date: 20171017
Implantable Lead Implant Date: 20171017
Implantable Lead Location: 753859
Implantable Lead Location: 753860
Implantable Lead Model: 377
Implantable Lead Model: 377
Implantable Lead Serial Number: 49593512
Implantable Lead Serial Number: 49620910
Implantable Pulse Generator Implant Date: 20171017
Pulse Gen Model: 394969
Pulse Gen Serial Number: 68784192

## 2021-04-14 ENCOUNTER — Encounter: Payer: Self-pay | Admitting: *Deleted

## 2021-04-14 NOTE — Addendum Note (Signed)
Addended by: Alois Cliche on: 04/14/2021 10:54 AM   Modules accepted: Orders

## 2021-04-14 NOTE — Telephone Encounter (Signed)
Order placed for 30 day event monitor per VO of Dr Graciela Husbands.

## 2021-04-14 NOTE — Progress Notes (Signed)
Patient ID: Brittney Cooper, female   DOB: 1961-01-13, 60 y.o.   MRN: [id number] Pt enrolled for [employer] to mail a 30 day cardiac event monitor to her home.

## 2021-04-18 ENCOUNTER — Ambulatory Visit (INDEPENDENT_AMBULATORY_CARE_PROVIDER_SITE_OTHER): Payer: Medicare Other

## 2021-04-18 ENCOUNTER — Encounter: Payer: Self-pay | Admitting: Internal Medicine

## 2021-04-18 DIAGNOSIS — R55 Syncope and collapse: Secondary | ICD-10-CM

## 2021-04-18 DIAGNOSIS — I471 Supraventricular tachycardia: Secondary | ICD-10-CM

## 2021-04-18 DIAGNOSIS — R002 Palpitations: Secondary | ICD-10-CM

## 2021-04-21 ENCOUNTER — Other Ambulatory Visit: Payer: Self-pay | Admitting: Cardiovascular Disease

## 2021-04-21 ENCOUNTER — Other Ambulatory Visit: Payer: Self-pay | Admitting: Internal Medicine

## 2021-04-22 NOTE — Telephone Encounter (Signed)
This is Dr. [place]'s pt.  °

## 2021-04-22 NOTE — Progress Notes (Signed)
Remote pacemaker transmission.   

## 2021-04-23 ENCOUNTER — Telehealth: Payer: Self-pay | Admitting: Physician Assistant

## 2021-04-23 NOTE — Telephone Encounter (Signed)
Call received from [employer]. Event monitor in place for feeling electricity in her chest.   Pt selected "passed out" as a symptom. EKG was stable at sinus arrhythmia HR 100. When [employer] called her, she states she lost consciousness for few seconds. She was walking with her service dog. She declined to go to the ER. She has a slight bruise on her head.   I called the patient and she states she passes out frequently. She generally has syncope twice weekly. She denies blurry vision, headache, or confusion. She was not having any sensations in her chest prior to LOC. She continues to decline ER evaluation.   Brittney Rutherford Adali Pennings, PA-C 04/23/2021, 6:22 PM [phone]

## 2021-04-24 NOTE — Telephone Encounter (Signed)
Signed by DOD and no new orders.

## 2021-04-24 NOTE — Telephone Encounter (Signed)
Critical monitor strip received for this event showing sinus arrhythmia HR 100 at time of syncope.  Will have signed by DOD and scanned into chart.

## 2021-04-25 NOTE — Telephone Encounter (Signed)
Noted  

## 2021-04-28 ENCOUNTER — Telehealth: Payer: Self-pay | Admitting: Internal Medicine

## 2021-04-28 NOTE — Telephone Encounter (Signed)
Critical ekg report  

## 2021-04-28 NOTE — Telephone Encounter (Addendum)
Spoke with pt who confirms syncopal episode.  Pt states symptoms are nothing new.  She denies hitting her head when falling or any additional injuries.  Pt states she is feeling fine now and declines going to ED for further evaluation.  Pt is scheduled for appointment 05/13/2021 with Dr Graciela Husbands.  Reviewed ED precautions and pt will continue monitoring.

## 2021-04-28 NOTE — Telephone Encounter (Signed)
Spoke with Brittney Cooper at [facility] with critical EKG alert for pt triggered syncopal episode at 1:06pm CT.  Monitor at that time shows Sinus tach at 113bpm.  Monitor company contacted pt who reports she did in fact pass out while walking.  Reported continued fatigue but denied other injury.  Pt was advised to go to the ED but declined.

## 2021-05-05 ENCOUNTER — Telehealth: Payer: Self-pay

## 2021-05-05 NOTE — Telephone Encounter (Signed)
Critical monitor report received from [employer] due to patient passing out. Monitor shows sinus tachycardia at 110 bpm at 7:41 pm CST.  Spoke with the patient who confirms she passed out. She states that she did not hit her head or injure herself. She states that she only felt lightheaded prior to the event. ED precautions reviewed with patient. Dr. Graciela Husbands is aware of these events. Patient is seeing Dr. Graciela Husbands 5/18.

## 2021-05-13 ENCOUNTER — Encounter: Payer: Self-pay | Admitting: Internal Medicine

## 2021-05-13 ENCOUNTER — Other Ambulatory Visit: Payer: Self-pay

## 2021-05-13 ENCOUNTER — Ambulatory Visit: Payer: Medicare Other | Admitting: Internal Medicine

## 2021-05-13 VITALS — BP 126/74 | HR 114 | Ht 64.0 in | Wt 182.0 lb

## 2021-05-13 DIAGNOSIS — G901 Familial dysautonomia [Riley-Day]: Secondary | ICD-10-CM | POA: Diagnosis not present

## 2021-05-13 DIAGNOSIS — I442 Atrioventricular block, complete: Secondary | ICD-10-CM

## 2021-05-13 DIAGNOSIS — I471 Supraventricular tachycardia: Secondary | ICD-10-CM

## 2021-05-13 NOTE — Patient Instructions (Signed)

## 2021-05-13 NOTE — Progress Notes (Signed)
Patient Care Team: Cheron Schaumann., MD as PCP - General (Internal Medicine)   HPI  Brittney Cooper is a 60 y.o. female Seen in followup for dysautonomic syncope.She had recurrent syncope with a monitor on.This was associated with a 6 second pause 2 . She had head trauma requiring laceration. She underwent pacing with a Biotronik CLS.  She has continued however to have recurrent syncope, these all associated with normal rhythm.   Little warning    Other contributing complex medical comorbidities outlined in the old notes. Alcoholic induced pancreatitis is noted in the old chart 9/17; suicidal ideation also reported hospitalized 2/18  Some hypertension   She continues to have episodes of syncope he says with less morning than previously.  Not sure with her eyes were open and close with her events.  Witnessed events have not been commented regarding pallor.  Scant palpitations on flecainide   Continues with labile BP 90>>220           Event Recorder personnally reviewed    Records and Results Reviewed   Past Medical History:  Diagnosis Date  . ADHD (attention deficit hyperactivity disorder)   . Alcohol abuse   . 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 Allergy   . Anxiety   . Arthritis   . Bipolar disorder (HCC)   . Cardiac arrest (HCC) 06-2015, 08-2016   x3- pt has pacemaker   . Chronic pain    back  . Depression   . Diabetes mellitus without complication (HCC)   . Fatty liver   . Fracture of lateral malleolus of right fibula   . Hepatomegaly   . Hyperlipidemia   . Hypertension   . OCD (obsessive compulsive disorder)   . Pancreatic pseudocyst   . Panic attack   . 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 Syncope    neurocardiogenic  . Third degree heart block [facility])     Past Surgical History:  Procedure Laterality Date  . ABDOMINAL HYSTERECTOMY    . ANTERIOR AND POSTERIOR  REPAIR    . BUNIONECTOMY Bilateral   . CESAREAN SECTION    . EP IMPLANTABLE DEVICE N/A 10/12/2016   Procedure: Pacemaker Implant;  Surgeon: Will Jorja Loa, MD;  [facility];  Service: Cardiovascular;  Laterality: N/A;  . finfger surgery    . FINGER SURGERY    . LTCS    . MYRINGOTOMY    . ORIF ANKLE FRACTURE Right 07/25/2015   Procedure: OPEN REDUCTION INTERNAL FIXATION (ORIF) RIGHT ANKLE ;  Surgeon: Sheral Apley, MD;  Location: [facility];  Service: Orthopedics;  Laterality: Right;  . PACEMAKER PLACEMENT    . RHINOPLASTY    . TONSILLECTOMY    . TUBAL LIGATION      Current Outpatient Medications  Medication Sig Dispense Refill  . ALPRAZolam (XANAX) 0.5 MG tablet Take 1 mg by mouth at bedtime.    Marland Kitchen amphetamine-dextroamphetamine (ADDERALL) 30 MG tablet Take 30-60 mg by mouth daily.    . ARIPiprazole (ABILIFY) 15 MG tablet Take 15 mg by mouth daily.    Marland Kitchen aspirin EC 81 MG tablet Take 1 tablet (81 mg total) by mouth daily. For heart health    . bisoprolol (ZEBETA) 5 MG tablet Take 1 tablet (5 mg total) by mouth daily. 90 tablet 1  . divalproex (DEPAKOTE) 250 MG DR tablet Take 250 mg  by mouth. Taking 3 at QHS    . doxazosin (CARDURA) 1 MG tablet Take 1 tablet (1 mg total) by mouth at bedtime. 90 tablet 1  . fenofibrate (TRICOR) 145 MG tablet Take 145 mg by mouth daily.    . flecainide (TAMBOCOR) 50 MG tablet TAKE ONE (1) TABLET BY MOUTH TWO (2) TIMES DAILY 180 tablet 1  . glipiZIDE (GLUCOTROL XL) 10 MG 24 hr tablet Take 5 mg by mouth daily with breakfast.    . ibuprofen (ADVIL) 200 MG tablet Take 400 mg by mouth in the morning and at bedtime.    Marland Kitchen JARDIANCE 10 MG TABS tablet Take 10 mg by mouth daily.    Marland Kitchen losartan (COZAAR) 25 MG tablet TAKE ONE (1) TABLET BY MOUTH EACH DAY 90 tablet 0  . magnesium gluconate (MAGONATE) 500 MG tablet Take 500 mg by mouth daily.    . metFORMIN (GLUCOPHAGE) 1000 MG tablet Take 1,000 mg by mouth 2 (two) times daily.    Marland Kitchen  omeprazole (PRILOSEC) 40 MG capsule Take 40 mg by mouth 2 (two) times daily.    . ondansetron (ZOFRAN) 4 MG tablet Take 1 tablet (4 mg total) by mouth daily as needed for nausea or vomiting. 30 tablet 0  . QUEtiapine (SEROQUEL) 100 MG tablet Take 200 mg by mouth at bedtime.     Marland Kitchen rOPINIRole (REQUIP) 0.5 MG tablet Take 0.5 mg by mouth at bedtime.    . rosuvastatin (CRESTOR) 20 MG tablet Take 20 mg by mouth daily.    Marland Kitchen thiamine 100 MG tablet Take 1 tablet (100 mg total) by mouth daily. For low thiamine 1 tablet 0   No current facility-administered medications for this visit.    Allergies  Allergen Reactions  . Codeine Nausea And Vomiting  . Demerol [Meperidine] Other (See Comments)    Hallucinations   . Metoprolol Other (See Comments)    nightmares      Review of Systems negative except from HPI and PMH  Physical Exam BP 126/74   Pulse (!) 114   Ht 5\' 4"  (1.626 m)   Wt 182 lb (82.6 kg)   SpO2 99%   BMI 31.24 kg/m  Well developed and well nourished in no acute distress HENT normal Neck supple with JVP-flat Clear Device pocket well healed; without hematoma or erythema.  There is no tethering  Regular rate and rhythm, no * murmur Abd-soft with active BS No Clubbing cyanosis  edema Skin-warm and dry A & Oriented  Grossly normal sensory and motor function  ECG   Assessment and  Plan  Syncope neurally mediated/Dysautonomia  Complete Heart Block intermittent  Pulm hypertension   Pacemaker  -- Biotronik       Hypertension-labile  Orthostatic hypertension  Atrial tachycardia  She continues to have syncope, and this is much more likely when her blood pressure started in the 90-100 range though it is in the 150 range.  Hence, I would suggest that we undertake a strategy where we use morning blood pressures to guide therapy for the day, i.e. for example if her blood pressure is less than 110 she takes nothing there is over 160 she takes something more and then her blood  pressure becomes episodically elevated she can take hydralazine and episodically low she can take ProAmatine.  I have reached out to Dr. about this.  Look forward to her response.  Some interval atrial tachycardia.  We will continue her on flecainide.  Device function is normal  CLS  could potentially be being more aggressive.  Unfortunately, the histogram data are not available today.  Her atrial pacing burden however is only 50%.  33 minutes spent

## 2021-05-18 ENCOUNTER — Telehealth: Payer: Self-pay

## 2021-05-18 NOTE — Telephone Encounter (Signed)
Late entry call received on 05/14/21 Call from pt reference returning to PREP class Explained next class starts. Would prefer the June afternoon class at [facility] will call when next class is starting

## 2021-05-19 ENCOUNTER — Telehealth: Payer: Self-pay

## 2021-05-19 NOTE — Telephone Encounter (Signed)
Called to discuss schedule for next PREP class, wants to join Brittney Cooper T/Th class starting June 21. Will call back early June to schedule assessment visit.

## 2021-06-02 ENCOUNTER — Telehealth: Payer: Self-pay

## 2021-06-02 NOTE — Telephone Encounter (Signed)
Left voicemail, calling to set up PREP Class assessment visit for week of June 13-16, to start PREP on June 21.

## 2021-06-08 ENCOUNTER — Telehealth: Payer: Self-pay

## 2021-06-08 NOTE — Telephone Encounter (Signed)
Text message sent.

## 2021-06-09 ENCOUNTER — Telehealth: Payer: Self-pay

## 2021-06-09 NOTE — Telephone Encounter (Signed)
Confirmed via text that pt has PREP intake assessment appt on Thursday June 16 at 11:30am at [facility]

## 2021-06-10 ENCOUNTER — Other Ambulatory Visit: Payer: Self-pay | Admitting: Cardiovascular Disease

## 2021-06-11 ENCOUNTER — Telehealth: Payer: Self-pay

## 2021-06-11 NOTE — Telephone Encounter (Signed)
Received text asking to re-schedule PREP assessment visit, is now scheduled to come Monday June 20 at 11:30am Brittney Cooper.

## 2021-06-15 NOTE — Progress Notes (Signed)
[facility] PREP Progress Report   Patient Details  Name: Brittney Cooper MRN: [id number] Date of Birth: November 17, 1961 Age: 60 y.o. PCP: Cheron Schaumann., MD  Vitals:   06/15/21 1225  BP: (!) 152/100  Pulse: 100  SpO2: 98%  Weight: 184 lb (83.5 kg)      [facility] Eval - 06/15/21 1200       Referral    Reason for referral Diabetes;Current Smoker;High Cholesterol;Hypertension;Inactivity    Program Start Date 06/16/21      Measurement   Waist Circumference 42 inches    Hip Circumference 44.5 inches    Body fat 38.8 percent      Information for Trainer   Goals --   Lose 10 pounds by end of program   Current Exercise none    Orthopedic Concerns --   L tib/fib fx and repair 3/22; R anke ORIF   Pertinent Medical History --   HTN, syncope, pacemaker, CHB, diabetes   Current Barriers --   caregiver for father   Restrictions/Precautions --   service dog   Medications that affect exercise Beta blocker      Timed Up and Go (TUGS)   Timed Up and Go Low risk <9 seconds      Mobility and Daily Activities   I find it easy to walk up or down two or more flights of stairs. 1    I have no trouble taking out the trash. 3    I do housework such as vacuuming and dusting on my own without difficulty. 1    I can easily lift a gallon of milk (8lbs). 3    I can easily walk a mile. 1    I have no trouble reaching into high cupboards or reaching down to pick up something from the floor. 2    I do not have trouble doing out-door work such as Loss adjuster, chartered, raking leaves, or gardening. 1      Mobility and Daily Activities   I feel younger than my age. 1    I feel independent. 1    I feel energetic. 2    I live an active life.  1    I feel strong. 1    I feel healthy. 1    I feel active as other people my age. 1      How fit and strong are you.   Fit and Strong Total Score 20            Past Medical History:  Diagnosis Date   ADHD (attention deficit hyperactivity disorder)     Alcohol abuse    Alcoholic pancreatitis 10/16, 11/16, 7/17   Allergy    Anxiety    Arthritis    Bipolar disorder (HCC)    Cardiac arrest (HCC) 06-2015, 08-2016   x3- pt has pacemaker    Chronic pain    back   Depression    Diabetes mellitus without complication (HCC)    Fatty liver    Fracture of lateral malleolus of right fibula    Hepatomegaly    Hyperlipidemia    Hypertension    OCD (obsessive compulsive disorder)    Pancreatic pseudocyst    Panic attack    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, working on OB floor as a nurse   Syncope    neurocardiogenic   Third degree heart block (HCC)    Past Surgical History:  Procedure Laterality Date  ABDOMINAL HYSTERECTOMY     ANTERIOR AND POSTERIOR REPAIR     BUNIONECTOMY Bilateral    CESAREAN SECTION     EP IMPLANTABLE DEVICE N/A 10/12/2016   Procedure: Pacemaker Implant;  Surgeon: Will Jorja Loa, MD;  [facility]: [facility];  Service: Cardiovascular;  Laterality: N/A;   finfger surgery     FINGER SURGERY     LTCS     MYRINGOTOMY     ORIF ANKLE FRACTURE Right 07/25/2015   Procedure: OPEN REDUCTION INTERNAL FIXATION (ORIF) RIGHT ANKLE ;  Surgeon: Sheral Apley, MD;  Location: [facility];  Service: Orthopedics;  Laterality: Right;   PACEMAKER PLACEMENT     RHINOPLASTY     TONSILLECTOMY     TUBAL LIGATION     Social History   Tobacco Use  Smoking Status Every Day   Packs/day: 1.00   Years: 15.00   Pack years: 15.00   Types: Cigarettes  Smokeless Tobacco Never   [facility] PREP classes, every T/Th 12:30-1:45 starting June 21  Faruq Rosenberger B Avaleen Brownley 06/15/2021, 12:31 PM

## 2021-06-16 NOTE — Progress Notes (Signed)
[facility] PREP Weekly Session   Patient Details  Name: Brittney Cooper MRN: [id number] Date of Birth: 1961/04/06 Age: 60 y.o. PCP: Cheron Schaumann., MD  There were no vitals filed for this visit.   [facility] Weekly seesion - 06/16/21 1600       Weekly Session   Topic Discussed --   First meeting: introductions, distribution and review PREP book/program, tour of facility;   Classes attended to date 1              [name] [name] 06/16/2021, 4:02 PM

## 2021-06-23 NOTE — Progress Notes (Signed)
[facility] PREP Weekly Session   Patient Details  Name: Brittney Cooper MRN: [id number] Date of Birth: 02/07/1961 Age: 60 y.o. PCP: Cheron Schaumann., MD  Vitals:   06/23/21 1507  Weight: 173 lb (78.5 kg)     [facility] Weekly seesion - 06/23/21 1500       Weekly Session   Topic Discussed Importance of resistance training;Other ways to be active    Classes attended to date 2           Fit testing completed today   Sonia Baller 06/23/2021, 3:08 PM

## 2021-06-30 NOTE — Progress Notes (Signed)
[facility] PREP Weekly Session   Patient Details  Name: Brittney Cooper MRN: [id number] Date of Birth: [birth year] Age: 60 y.o. PCP: Cheron Schaumann., MD  Vitals:   06/30/21 1431  Weight: 174 lb 6.4 oz (79.1 kg)     [facility] Weekly seesion - 06/30/21 1400       Weekly Session   Topic Discussed Healthy eating tips   sugar demo   Minutes exercised this week 200 minutes    Classes attended to date 3              Kim Oki B Antoino Westhoff 06/30/2021, 2:32 PM

## 2021-06-30 NOTE — Telephone Encounter (Signed)
Can you call her please?  Next available is fine.  Thank you

## 2021-07-07 NOTE — Progress Notes (Signed)
[facility] PREP Weekly Session   Patient Details  Name: Brittney Cooper MRN: [id number] Date of Birth: 07/21/1961 Age: 60 y.o. PCP: Cheron Schaumann., MD  Vitals:   07/07/21 1350  Weight: 178 lb 3.2 oz (80.8 kg)     [facility] Weekly seesion - 07/07/21 1300       Weekly Session   Topic Discussed Health habits    Minutes exercised this week 120 minutes    Classes attended to date 4              Terren Haberle B Tritia Endo 07/07/2021, 1:51 PM

## 2021-07-08 ENCOUNTER — Ambulatory Visit (INDEPENDENT_AMBULATORY_CARE_PROVIDER_SITE_OTHER): Payer: Medicare Other

## 2021-07-08 DIAGNOSIS — I442 Atrioventricular block, complete: Secondary | ICD-10-CM

## 2021-07-08 LAB — CUP PACEART REMOTE DEVICE CHECK
Date Time Interrogation Session: 20220713072951
Implantable Lead Implant Date: 20171017
Implantable Lead Implant Date: 20171017
Implantable Lead Location: 753859
Implantable Lead Location: 753860
Implantable Lead Model: 377
Implantable Lead Model: 377
Implantable Lead Serial Number: 49593512
Implantable Lead Serial Number: 49620910
Implantable Pulse Generator Implant Date: 20171017
Pulse Gen Model: 394969
Pulse Gen Serial Number: 68784192

## 2021-07-30 NOTE — Progress Notes (Signed)
Remote pacemaker transmission.   

## 2021-08-25 ENCOUNTER — Other Ambulatory Visit: Payer: Self-pay | Admitting: Cardiovascular Disease

## 2021-09-02 NOTE — Progress Notes (Signed)
Registered for PREP program scheduled 06/16/21-09/01/21; did not complete, attended 4 education classes, last date of attendance was 07/07/21.

## 2021-10-07 ENCOUNTER — Ambulatory Visit (INDEPENDENT_AMBULATORY_CARE_PROVIDER_SITE_OTHER): Payer: Medicare Other

## 2021-10-07 DIAGNOSIS — I442 Atrioventricular block, complete: Secondary | ICD-10-CM

## 2021-10-08 LAB — CUP PACEART REMOTE DEVICE CHECK
Date Time Interrogation Session: 20221012082512
Implantable Lead Implant Date: 20171017
Implantable Lead Implant Date: 20171017
Implantable Lead Location: 753859
Implantable Lead Location: 753860
Implantable Lead Model: 377
Implantable Lead Model: 377
Implantable Lead Serial Number: 49593512
Implantable Lead Serial Number: 49620910
Implantable Pulse Generator Implant Date: 20171017
Pulse Gen Model: 394969
Pulse Gen Serial Number: 68784192

## 2021-10-15 NOTE — Progress Notes (Signed)
Remote pacemaker transmission.   

## 2021-11-09 ENCOUNTER — Other Ambulatory Visit: Payer: Self-pay | Admitting: Cardiovascular Disease

## 2021-11-17 ENCOUNTER — Telehealth: Payer: Self-pay | Admitting: Internal Medicine

## 2021-11-17 NOTE — Telephone Encounter (Signed)
Spoke with pt who confirms her complaints of left arm tingling for about 1.5 weeks and a feeling of "electricity" radiating down her sternum for several weeks.  Pt denies current CP, SOB or dizziness.  She reports her BP continues to be "all over the place"  She is taking medications as prescribed.   Discussed pt with Otilio Saber, PA-C who recommends pt be seen in device clinic 11/18/2021 to rule out a pacemaker issue even though it is unlikely.  Reviewed ED precautions and pt scheduled with device tomorrow at 10am.  Pt verbalizes understanding and agrees with current plan.

## 2021-11-17 NOTE — Telephone Encounter (Signed)
Patient states for the patient 1.5 weeks she has been experiencing tingling in her left arm. She states over the past few months she has also had a feeling of "electricity" radiating upand down her sternum. She denies pain and additional symptoms, but she would like to know if this is normal and if Dr. Graciela Husbands has any recommendations. Please advise.

## 2021-11-18 ENCOUNTER — Encounter: Payer: Medicare Other | Admitting: Internal Medicine

## 2021-12-07 ENCOUNTER — Encounter: Payer: Self-pay | Admitting: Internal Medicine

## 2021-12-08 ENCOUNTER — Other Ambulatory Visit: Payer: Self-pay

## 2021-12-08 ENCOUNTER — Ambulatory Visit (INDEPENDENT_AMBULATORY_CARE_PROVIDER_SITE_OTHER): Payer: Medicare Other | Admitting: Cardiovascular Disease

## 2021-12-08 VITALS — BP 147/109 | HR 98 | Ht 64.0 in | Wt 165.6 lb

## 2021-12-08 DIAGNOSIS — I10 Essential (primary) hypertension: Secondary | ICD-10-CM | POA: Diagnosis not present

## 2021-12-08 DIAGNOSIS — R55 Syncope and collapse: Secondary | ICD-10-CM

## 2021-12-08 DIAGNOSIS — G901 Familial dysautonomia [Riley-Day]: Secondary | ICD-10-CM

## 2021-12-08 DIAGNOSIS — I471 Supraventricular tachycardia: Secondary | ICD-10-CM | POA: Diagnosis not present

## 2021-12-08 DIAGNOSIS — Z720 Tobacco use: Secondary | ICD-10-CM

## 2021-12-08 MED ORDER — MIDODRINE HCL 2.5 MG PO TABS: 2.5000 mg | ORAL_TABLET | Freq: Two times a day (BID) | ORAL | 5 refills | Status: DC | PRN

## 2021-12-08 NOTE — Patient Instructions (Signed)
Medication Instructions:  TAKE MEDICATIONS AS BELOW IF BLOOD PRESSURE IS BELOW 130 TAKE MIDODRINE 2.5 MG BUT NO BISOPROLOL OR LOSARTAN  IF BLOOD PRESSURE IS 130-140 TAKE MIDODRINE AND BISOPROLOL BUT NOT LOSARTAN  IF BLOOD PRESSURE IS 141-159 TAKE MIDODRINE, BISOPROLOL, AND LOSARTAN  IF BLOOD PRESSURE IS 160 OR ABOVE TAKE BISOPROLOL AND LOSARTAN BUT NO MIDODRINE   Labwork: NONE   Testing/Procedures: NONE  Follow-Up: 01/13/2022 10:00 AM WITH PHARM D AT THE [facility]   You have been referred to [facility] YOU MAY BE REQUIRED BY THEM TO SEE GENERAL CARDIOLOGY PRIOR TO GETTING AN APPOINTMENT IF YOU DO NOT HEAR FROM THEM IN 2 WEEKS PLEASE CALL THE OFFICE TO FOLLOW UP

## 2021-12-08 NOTE — Assessment & Plan Note (Signed)
Blood pressures are extremely labile.  She will check her BP daily and do the following:  SBP <130: midodrine 2.5mg  bid SBP 130-14: Midodrine 2.5mg  , bisoprolol SBP 140-159: Midodrine, bisoprolol and losartan SBP >160: bisoprolol and losartan  Referral to syncope clinic for dysautonomia

## 2021-12-08 NOTE — Assessment & Plan Note (Signed)
Relatively well-controlled.  Continue flecainide and bisoprolol.  She has follow-up with Dr. Graciela Husbands for this.

## 2021-12-08 NOTE — Assessment & Plan Note (Signed)
She continues to struggle with dysautonomia and recurrent syncope.  She has been salt loading without improvement.  She has also been trying compression socks and abdominal binder in the past but did not find any improvement with this either and had decided to stop using them.  At this point I think that she should be evaluated by the syncope clinic at [facility].  We will add back her midodrine with recommendations for titration of her antihypertensives.  She is already had a pacemaker placed and continues to have recurrent syncopal episodes that are not arrhythmogenic.

## 2021-12-08 NOTE — Progress Notes (Signed)
Hypertension Clinic Follow Up:    Date:  12/09/2021   ID:  Brittney Cooper, DOB 27-Nov-1961, MRN [id number]  PCP:  Brittney Cooper., MD  Cardiologist:  None   Referring MD: Brittney Cooper.,*   CC: Hypertension  History of Present Illness:    Brittney Cooper is a 60 y.o. female with a hx of hypertension and dysautonomia and syncope s/p PPM, pulmonary hypertension, atrial tachycardia, EtOH related pancreatitis here to establish care in the hypertension clinic. She has a couple falls and syncope.  She wore an ambulatory monitor 11/2019 when she had a syncopal event and was noted to be in sinus rhythm.  In 2016 she had an episode of syncope and was found to have intermittent CHB.  This was associated with 6-second pauses.  She had a Biotronik pacemaker implanted.  She also saw Brittney Cooper at [facility] and trazodone was discontinued with some improvement in her dizziness and no additional episodes of syncope.  For the last 3 years she has had very labile hypertension.  She has been taking midodrine and hydralazine as needed.  However she has not needed the midodrine lately because her blood pressure has been so high.  Her blood pressure lately has been extremely high, as high as the 200s over 100s.  Lately she noticed that when her blood pressure was in a more normal range she feels lightheaded.  When this happens she goes to [place].  It seems as though her blood pressure changes without reason.  It is associated with some palpitations.  Brittney Cooper was previously unable unable to exercise because of shortness of breath.  She tried holding Cymbalta to see if it helped her blood pressure but there was no improvement.  At last appointment she was continuing to smoke.  Hydralazine and midodrine were both held and she was started on bisoprolol and doxazosin.  At last appointment her blood pressures were improved.  She had lab testing that was negative for both pheochromocytoma and  hyperaldosteronism.  Thyroid function was normal.  Renal artery Dopplers were within normal limits 08/2020.  At her last appointment her blood pressure remained labile but was better.  Losartan was added to her regimen.  She called the office episode of syncope on 03/2021.  Event monitor at the time revealed sinus arrhythmia with a heart rate in the 100s.  It occurred while walking with her service dog.  She last saw Brittney Cooper 04/2021 and reported labile blood pressures ranging from the 90s to the 220s.  He recommended that she use her morning blood pressure to guide her antihypertensive regimen.  He recommended that she not take any medicine if her blood pressure is less than 110 in the morning and treat her BP more aggressively if her BP is >160.   Brittney Cooper continues to have labile blood pressures.  She has been experiencing recurrent syncope. She has very little warning.  She feels like her extremities all go limp and then she passes out.  She was also noted tingling in her arm.  It is not associated with the syncopal episodes.  She is occasionally feels an electric sensation in the center of her chest.  It lasts for couple minutes.  There is no associated shortness of breath.  It occurs randomly and not necessarily with exertion or any positional changes.  Last week she had another syncopal episode and fell in her bedroom.  She checked her blood pressure after and  it was 80/56.  She has tried wearing an abdominal binder, compression socks, and increasing her salt without any improvement.  Previous antihypertensives: Metoprolol- nightmares Hydralazine  Past Medical History:  Diagnosis Date   ADHD (attention deficit hyperactivity disorder)    Alcohol abuse    Alcoholic pancreatitis [id number], 11/16, 7/17   Allergy    Anxiety    Arthritis    Bipolar disorder ([place])    Cardiac arrest ([place]) 06-2015, 08-2016   x3- pt has pacemaker    Chronic pain    back   Depression    Diabetes mellitus without  complication ([place])    Fatty liver    Fracture of lateral malleolus of right fibula    Hepatomegaly    Hyperlipidemia    Hypertension    OCD (obsessive compulsive disorder)    Pancreatic pseudocyst    Panic attack    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, working on OB floor as a nurse   Syncope    neurocardiogenic   Third degree heart block ([place])     Past Surgical History:  Procedure Laterality Date   ABDOMINAL HYSTERECTOMY     ANTERIOR AND POSTERIOR REPAIR     BUNIONECTOMY Bilateral    CESAREAN SECTION     EP IMPLANTABLE DEVICE N/A 10/12/2016   Procedure: Pacemaker Implant;  Surgeon: Brittney Brittney Leeds, MD;  Location: [place] [facility];  Service: Cardiovascular;  Laterality: N/A;   finfger surgery     FINGER SURGERY     LTCS     MYRINGOTOMY     ORIF ANKLE FRACTURE Right 07/25/2015   Procedure: OPEN REDUCTION INTERNAL FIXATION (ORIF) RIGHT ANKLE ;  Surgeon: Brittney Butters, MD;  Location: [place];  Service: Orthopedics;  Laterality: Right;   PACEMAKER PLACEMENT     RHINOPLASTY     TONSILLECTOMY     TUBAL LIGATION      Current Medications: Current Meds  Medication Sig   ALPRAZolam (XANAX) 0.5 MG tablet Take 1 mg by mouth at bedtime.   amphetamine-dextroamphetamine (ADDERALL) 30 MG tablet Take 30-60 mg by mouth daily.   ARIPiprazole (ABILIFY) 15 MG tablet Take 15 mg by mouth daily.   aspirin EC 81 MG tablet Take 1 tablet (81 mg total) by mouth daily. For heart health   bisoprolol (ZEBETA) 5 MG tablet Take 1 tablet (5 mg total) by mouth daily. Keep upcoming appointment for future refills   divalproex (DEPAKOTE) 250 MG DR tablet Take 250 mg by mouth. Taking 3 at [facility]   doxazosin (CARDURA) 1 MG tablet Take 1 tablet (1 mg total) by mouth at bedtime. Keep upcoming appointment for future refills   fenofibrate (TRICOR) 145 MG tablet Take 145 mg by mouth daily.   flecainide (TAMBOCOR) 50 MG tablet TAKE ONE (1) TABLET BY MOUTH  TWO (2) TIMES DAILY   glipiZIDE (GLUCOTROL XL) 10 MG 24 hr tablet Take 5 mg by mouth daily with breakfast.   ibuprofen (ADVIL) 200 MG tablet Take 400 mg by mouth in the morning and at bedtime.   losartan (COZAAR) 25 MG tablet TAKE ONE (1) TABLET BY MOUTH EACH DAY   magnesium gluconate (MAGONATE) 500 MG tablet Take 500 mg by mouth daily.   metFORMIN (GLUCOPHAGE) 1000 MG tablet Take 1,000 mg by mouth 2 (two) times daily.   midodrine (PROAMATINE) 2.5 MG tablet Take 1 tablet (2.5 mg total) by mouth 2 (two) times daily as needed.   omeprazole (PRILOSEC) 40  MG capsule Take 40 mg by mouth 2 (two) times daily.   QUEtiapine (SEROQUEL) 100 MG tablet Take 200 mg by mouth at bedtime.    rOPINIRole (REQUIP) 0.5 MG tablet Take 0.5 mg by mouth at bedtime.   rosuvastatin (CRESTOR) 20 MG tablet Take 20 mg by mouth daily.   thiamine 100 MG tablet Take 1 tablet (100 mg total) by mouth daily. For low thiamine     Allergies:   Codeine, Demerol [meperidine], and Metoprolol   Social History   Socioeconomic History   Marital status: Divorced    Spouse name: Not on file   Number of children: Not on file   Years of education: Not on file   Highest education level: Not on file  Occupational History   Occupation: RN  Tobacco Use   Smoking status: Every Day    Packs/day: 1.00    Years: 15.00    Pack years: 15.00    Types: Cigarettes   Smokeless tobacco: Never  Vaping Use   Vaping Use: Some days   Substances: Nicotine  Substance and Sexual Activity   Alcohol use: Yes    Alcohol/week: 0.0 standard drinks    Comment: drinks for couple of days, then no alcohol for days   Drug use: No   Sexual activity: Not Currently    Birth control/protection: Surgical, Abstinence  Other Topics Concern   Not on file  Social History Narrative   Not on file   Social Determinants of Health   Financial Resource Strain: Not on file  Food Insecurity: Not on file  Transportation Needs: Not on file  Physical Activity:  Not on file  Stress: Not on file  Social Connections: Not on file     Family History: The patient's family history includes Colon cancer in her paternal grandfather; Colon polyps in her father and mother; Hyperlipidemia in her brother and sister; Hypertension in her brother, father, mother, and sister; Kidney disease in her mother; Kidney failure in her mother; Lung cancer in her father; Stomach cancer in her maternal grandfather; Stroke in her mother. There is no history of Rectal cancer, Esophageal cancer, or Pancreatic cancer.  ROS:   Please see the history of present illness.    All other systems reviewed and are negative.  EKGs/Labs/Other Studies Reviewed:    EKG:  EKG 12/08/21: Sinus arrhytnmia rate 98 bpm.  Atrial pacing.  Nonspecific T wave abnoarmality  Recent Labs: No results found for requested labs within last 8760 hours.   Recent Lipid Panel    Component Value Date/Time   CHOL 212 (H) 11/30/2020 0809   TRIG 129 11/30/2020 0809   HDL 48 11/30/2020 0809   CHOLHDL 4.4 11/30/2020 0809   VLDL 26 11/30/2020 0809   LDLCALC 138 (H) 11/30/2020 0809    Physical Exam:    VS:  BP (!) 147/109 (BP Location: Right Arm, Patient Position: Sitting, Cuff Size: Normal)    Pulse 98    Ht 5\' 4"  (1.626 m)    Wt 165 lb 9.6 oz (75.1 kg)    BMI 28.43 kg/m     Wt Readings from Last 3 Encounters:  12/08/21 165 lb 9.6 oz (75.1 kg)  07/07/21 178 lb 3.2 oz (80.8 kg)  06/30/21 174 lb 6.4 oz (79.1 kg)    GENERAL:  Well appearing HEENT: Pupils equal round and reactive, fundi not visualized, oral mucosa unremarkable NECK:  No jugular venous distention, waveform within normal limits, carotid upstroke brisk and symmetric, no bruits, no thyromegaly  LUNGS:  Clear to auscultation bilaterally HEART:  RRR.  PMI not displaced or sustained,S1 and S2 within normal limits, no S3, no S4, no clicks, no rubs, no murmurs ABD:  Flat, positive bowel sounds normal in frequency in pitch, no bruits, no rebound,  no guarding, no midline pulsatile mass, no hepatomegaly, no splenomegaly EXT:  2 plus pulses throughout, no edema, no cyanosis no clubbing SKIN:  No rashes no nodules NEURO:  Cranial nerves II through XII grossly intact, motor grossly intact throughout PSYCH:  Cognitively intact, oriented to person place and time   ASSESSMENT:    1. Syncope, unspecified syncope type   2. Atrial tachycardia ([place])   3. Dysautonomia ([place])   4. Orthostatic hypertension   5. Tobacco use      PLAN:    Atrial tachycardia ([place]) Relatively well-controlled.  Continue flecainide and bisoprolol.  She has follow-up with Brittney Cooper for this.  Dysautonomia ([place]) She continues to struggle with dysautonomia and recurrent syncope.  She has been salt loading without improvement.  She has also been trying compression socks and abdominal binder in the past but did not find any improvement with this either and had decided to stop using them.  At this point I think that she should be evaluated by the syncope clinic at [facility].  We Brittney add back her midodrine with recommendations for titration of her antihypertensives.  She is already had a pacemaker placed and continues to have recurrent syncopal episodes that are not arrhythmogenic.  Orthostatic hypertension Blood pressures are extremely labile.  She Brittney check her BP daily and do the following:  SBP <130: midodrine 2.5mg  bid SBP 130-14: Midodrine 2.5mg  , bisoprolol SBP 140-159: Midodrine, bisoprolol and losartan SBP >160: bisoprolol and losartan  Referral to syncope clinic for dysautonomia  Tobacco use Continuing to encourage smoking cessation.   Disposition:    FU with MD/PharmD in 1 month   Medication Adjustments/Labs and Tests Ordered: Current medicines are reviewed at length with the patient today.  Concerns regarding medicines are outlined above.  Orders Placed This Encounter  Procedures   Ambulatory referral to Cardiology   EKG 12-Lead    Meds ordered  this encounter  Medications   midodrine (PROAMATINE) 2.5 MG tablet    Sig: Take 1 tablet (2.5 mg total) by mouth 2 (two) times daily as needed.    Dispense:  60 tablet    Refill:  5      Signed, Skeet Latch, MD  12/09/2021 11:46 AM    [place]

## 2021-12-09 ENCOUNTER — Encounter (HOSPITAL_BASED_OUTPATIENT_CLINIC_OR_DEPARTMENT_OTHER): Payer: Self-pay | Admitting: Cardiovascular Disease

## 2021-12-09 NOTE — Assessment & Plan Note (Signed)
Continuing to encourage smoking cessation.

## 2021-12-14 ENCOUNTER — Other Ambulatory Visit: Payer: Self-pay | Admitting: Internal Medicine

## 2021-12-22 ENCOUNTER — Other Ambulatory Visit (HOSPITAL_BASED_OUTPATIENT_CLINIC_OR_DEPARTMENT_OTHER): Payer: Self-pay | Admitting: Internal Medicine

## 2021-12-22 DIAGNOSIS — Z1231 Encounter for screening mammogram for malignant neoplasm of breast: Secondary | ICD-10-CM

## 2021-12-23 ENCOUNTER — Telehealth (HOSPITAL_BASED_OUTPATIENT_CLINIC_OR_DEPARTMENT_OTHER): Payer: Self-pay | Admitting: *Deleted

## 2021-12-23 NOTE — Telephone Encounter (Signed)
Kay faxed records to Syncope clinic Dr Erenest Rasher   Left message to call back

## 2021-12-29 ENCOUNTER — Inpatient Hospital Stay (HOSPITAL_BASED_OUTPATIENT_CLINIC_OR_DEPARTMENT_OTHER): Admission: RE | Admit: 2021-12-29 | Payer: Medicare Other | Source: Ambulatory Visit

## 2021-12-29 ENCOUNTER — Encounter (HOSPITAL_BASED_OUTPATIENT_CLINIC_OR_DEPARTMENT_OTHER): Payer: Self-pay

## 2021-12-29 DIAGNOSIS — Z1231 Encounter for screening mammogram for malignant neoplasm of breast: Secondary | ICD-10-CM

## 2022-01-05 LAB — CUP PACEART REMOTE DEVICE CHECK
Date Time Interrogation Session: 20230110093508
Implantable Lead Implant Date: 20171017
Implantable Lead Implant Date: 20171017
Implantable Lead Location: 753859
Implantable Lead Location: 753860
Implantable Lead Model: 377
Implantable Lead Model: 377
Implantable Lead Serial Number: 49593512
Implantable Lead Serial Number: 49620910
Implantable Pulse Generator Implant Date: 20171017
Pulse Gen Model: 394969
Pulse Gen Serial Number: 68784192

## 2022-01-06 ENCOUNTER — Ambulatory Visit (INDEPENDENT_AMBULATORY_CARE_PROVIDER_SITE_OTHER): Payer: Medicare Other

## 2022-01-06 DIAGNOSIS — I442 Atrioventricular block, complete: Secondary | ICD-10-CM

## 2022-01-07 ENCOUNTER — Telehealth: Payer: Self-pay

## 2022-01-07 NOTE — Telephone Encounter (Signed)
Lmom to r/s °

## 2022-01-09 ENCOUNTER — Telehealth (HOSPITAL_BASED_OUTPATIENT_CLINIC_OR_DEPARTMENT_OTHER): Payer: Self-pay

## 2022-01-12 NOTE — Telephone Encounter (Signed)
Pt returned a call to r/s appt

## 2022-01-12 NOTE — Telephone Encounter (Signed)
Called and spoke to pt's son and stated that we will cancel the appt and await the pt to call us back to r/s

## 2022-01-12 NOTE — Telephone Encounter (Signed)
Lmom to r/s °

## 2022-01-13 ENCOUNTER — Ambulatory Visit: Payer: Medicaid Other

## 2022-01-15 ENCOUNTER — Other Ambulatory Visit: Payer: Self-pay | Admitting: Internal Medicine

## 2022-01-15 NOTE — Progress Notes (Signed)
Remote pacemaker transmission.   

## 2022-02-04 ENCOUNTER — Ambulatory Visit: Payer: Medicaid Other

## 2022-02-04 ENCOUNTER — Telehealth: Payer: Self-pay

## 2022-02-04 ENCOUNTER — Ambulatory Visit (HOSPITAL_BASED_OUTPATIENT_CLINIC_OR_DEPARTMENT_OTHER): Payer: Medicare Other

## 2022-02-04 NOTE — Telephone Encounter (Signed)
LMOM TO R/S 

## 2022-02-09 ENCOUNTER — Telehealth (HOSPITAL_BASED_OUTPATIENT_CLINIC_OR_DEPARTMENT_OTHER): Payer: Self-pay

## 2022-02-10 ENCOUNTER — Other Ambulatory Visit: Payer: Self-pay

## 2022-02-10 ENCOUNTER — Ambulatory Visit (INDEPENDENT_AMBULATORY_CARE_PROVIDER_SITE_OTHER): Payer: Medicare Other

## 2022-02-10 DIAGNOSIS — I4719 Other supraventricular tachycardia: Secondary | ICD-10-CM

## 2022-02-10 DIAGNOSIS — I442 Atrioventricular block, complete: Secondary | ICD-10-CM

## 2022-02-10 DIAGNOSIS — I471 Supraventricular tachycardia: Secondary | ICD-10-CM | POA: Diagnosis not present

## 2022-02-10 LAB — CUP PACEART INCLINIC DEVICE CHECK
Brady Statistic RA Percent Paced: 51 %
Brady Statistic RV Percent Paced: 0 %
Date Time Interrogation Session: 20230215165134
Implantable Lead Implant Date: 20171017
Implantable Lead Implant Date: 20171017
Implantable Lead Location: 753859
Implantable Lead Location: 753860
Implantable Lead Model: 377
Implantable Lead Model: 377
Implantable Lead Serial Number: 49593512
Implantable Lead Serial Number: 49620910
Implantable Pulse Generator Implant Date: 20171017
Lead Channel Impedance Value: 448 Ohm
Lead Channel Impedance Value: 507 Ohm
Lead Channel Pacing Threshold Amplitude: 0.6 V
Lead Channel Pacing Threshold Amplitude: 1.2 V
Lead Channel Pacing Threshold Pulse Width: 0.4 ms
Lead Channel Pacing Threshold Pulse Width: 0.4 ms
Lead Channel Sensing Intrinsic Amplitude: 2.5 mV
Lead Channel Sensing Intrinsic Amplitude: 8.7 mV
Lead Channel Setting Pacing Amplitude: 2 V
Lead Channel Setting Pacing Amplitude: 2.8 V
Lead Channel Setting Pacing Pulse Width: 0.4 ms
Pulse Gen Model: 394969
Pulse Gen Serial Number: 68784192

## 2022-02-10 NOTE — Progress Notes (Signed)
Pacemaker check in clinic per request from Oda Kilts, [place].   Normal device function. Thresholds, sensing, impedances consistent with previous measurements. Last 20 AT/ HVR episodes reviewed.  Longest AT episode was just over 1 minute long, with Average V rate of 143.  HVR episodes appear SVT with the fastest rate of 186 for 8 seconds.  Device programmed to maximize longevity. Device programmed at appropriate safety margins. Histogram distribution appropriate for patient activity level. Device programmed to optimize intrinsic conduction. Estimated longevity 5.5 years. Patient enrolled in remote follow-up, transmitting nightly, next scheduled summary report 04/07/22. Patient education completed.  Patient appt was originally scheduled 11/18/21, patient did not show for appt.  She was scheduled at instruction of Oda Kilts to check her device function related to numbness occurring in her left hand/ arm.  Patient states she did not come that day because she wasn't feeling well.  The appt has been rescheduled a couple of times since.  Patient states the intermittent numbness continues to happen, she describes it as pins and needles sensation throughout her left arm from the should down.  She states this has been happening since November and occurs at least a few times per day.  Nothing she does stops the sensation.  I asked if she has seen her PCP regarding this and she stated her PCP won't look into it until she hears from cardiology that it is not related to her PPM.

## 2022-03-16 ENCOUNTER — Telehealth: Payer: Self-pay

## 2022-03-16 NOTE — Telephone Encounter (Signed)
Biotronik alert received for monitor disconnected for atleast 21 days. Attempted to call patient to notify. No answer, LMTCB. ? ? ? ?
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
## 2022-03-24 NOTE — Telephone Encounter (Signed)
Error

## 2022-04-07 ENCOUNTER — Ambulatory Visit (INDEPENDENT_AMBULATORY_CARE_PROVIDER_SITE_OTHER): Payer: Medicare Other

## 2022-04-07 DIAGNOSIS — I442 Atrioventricular block, complete: Secondary | ICD-10-CM

## 2022-04-07 LAB — CUP PACEART REMOTE DEVICE CHECK
Battery Remaining Percentage: 55 %
Brady Statistic RA Percent Paced: 49 %
Brady Statistic RV Percent Paced: 1 %
Date Time Interrogation Session: 20230411080812
Implantable Lead Implant Date: 20171017
Implantable Lead Implant Date: 20171017
Implantable Lead Location: 753859
Implantable Lead Location: 753860
Implantable Lead Model: 377
Implantable Lead Model: 377
Implantable Lead Serial Number: 49593512
Implantable Lead Serial Number: 49620910
Implantable Pulse Generator Implant Date: 20171017
Lead Channel Impedance Value: 468 Ohm
Lead Channel Impedance Value: 566 Ohm
Lead Channel Pacing Threshold Amplitude: 0.5 V
Lead Channel Pacing Threshold Amplitude: 1.3 V
Lead Channel Pacing Threshold Pulse Width: 0.4 ms
Lead Channel Pacing Threshold Pulse Width: 0.4 ms
Lead Channel Sensing Intrinsic Amplitude: 0.6 mV
Lead Channel Sensing Intrinsic Amplitude: 5.9 mV
Lead Channel Setting Pacing Amplitude: 2 V
Lead Channel Setting Pacing Amplitude: 2.8 V
Lead Channel Setting Pacing Pulse Width: 0.4 ms
Pulse Gen Model: 394969
Pulse Gen Serial Number: 68784192

## 2022-04-23 NOTE — Progress Notes (Signed)
Remote pacemaker transmission.   

## 2022-05-21 ENCOUNTER — Ambulatory Visit (INDEPENDENT_AMBULATORY_CARE_PROVIDER_SITE_OTHER): Payer: Medicare Other | Admitting: General Practice

## 2022-05-21 ENCOUNTER — Telehealth: Payer: Self-pay | Admitting: *Deleted

## 2022-05-21 ENCOUNTER — Encounter (HOSPITAL_COMMUNITY): Payer: Self-pay | Admitting: Orthopedic Surgery

## 2022-05-21 DIAGNOSIS — Z01810 Encounter for preprocedural cardiovascular examination: Secondary | ICD-10-CM | POA: Diagnosis not present

## 2022-05-21 NOTE — Telephone Encounter (Signed)
Patient's aspirin is not prescribed by cardiology.  Prescribing provider will need to make recommendations on holding aspirin.  Preoperative team, please contact this patient and set up a phone call appointment for further cardiac evaluation.  Thank you for your help.  Thomasene Ripple. Asli Tokarski NP-C    05/21/2022, 1:15 PM [facility] [address] Office [phone] Fax [phone]

## 2022-05-21 NOTE — Telephone Encounter (Signed)
   Pre-operative Risk Assessment    Patient Name: Brittney Cooper  DOB: 08-May-1961 MRN: [id number]      Request for Surgical Clearance    Procedure:   I&D RIGHT ANKLE INFECTION  Date of Surgery:  Clearance 05/23/22                                 Surgeon:  DR. Marchia Bond Surgeon's Group or Practice Name:  [facility] Phone number:  [phone] EXT [phone] ATTN: [place] Fax number:  [phone]   Type of Clearance Requested:   - Medical ; ASA   Type of Anesthesia:   CHOICE   Additional requests/questions:    Jiles Prows   05/21/2022, 12:56 PM

## 2022-05-21 NOTE — Progress Notes (Signed)
Anesthesia Chart Review: Brittney Cooper  Case: [id number] Date/Time: 05/23/22 0715   Procedure: INCISION AND DRAINAGE ABSCESS, RIGHT ANKLE (Right)   Anesthesia type: Choice   Pre-op diagnosis: INFECTED RIGHT ANKLE   Location: [facility] OR ROOM 06 / [place] [facility]   Surgeons: Brittney Bond, MD       DISCUSSION: Patient is a 61 year old female scheduled for the above procedure. History of ORIF right ankle fracture in 2016. Seen at [facility] on 05/20/22 with right ankle swelling and drainage noted the day prior.  X-ray showed lateral soft tissue edema but no fracture.  She was given Bactroban ointment and referred to orthopedics.  History includes smoking, Bipolar disorder, OCD, anxiety with panic attacks, PTSD, alcohol abuse, alcoholic pancreatitis (0000000, 2021), hepatomegaly/fatty liver, neurocardiogenic syncope/dysautonomia, CHB/sinus arrest/6 second pause (6 second pause on monitor with syncope 08/2016, s/p Biotronik dual chamber PPM 10/12/16), HLD, DM2, right ankle fracture (s/p ORIF 07/25/15).    Last cardiology visit with Dr. Oval Linsey in 11/2021, but had preoperative telephonic evaluatoin on 05/21/22 by Brittney Memos, NP, "..based on ACC/AHA guidelines, Brittney Cooper would be at acceptable risk for the planned procedure without further cardiovascular testing...  Her RCRI is a class I risk, 0.4% risk of major cardiac event.  She is able to complete greater than 4 METS of physical activity..." He recommended perioperative ASA instructions by the prescribing provider.   04/06/22 Remote PPM Transmission: "Normal device function. There was one mode switch per day, EGM shows AT. HVR appears to be SVT Next remote needs to be scheduled, sent to triage." -  Awaiting perioperative cardiac device recommendations from EP. PAT RN notified Biotronik Rep.   Anesthesia team to evaluate on the day of surgery. Reports "social" drinking.  For updated labs on arrival as indicated.   VS: Ht 5\' 4"  (1.626 m)   Wt 74.4  kg   BMI 28.15 kg/m  BP Readings from Last 3 Encounters:  12/08/21 (!) 147/109  06/15/21 (!) 152/100  05/13/21 126/74   Pulse Readings from Last 3 Encounters:  12/08/21 98  06/15/21 100  05/13/21 (!) 114     PROVIDERS: Brittney Cooper., MD is PCP. Brittney Latch, MD is cardiologist ([facility]).  Last visit 12/09/2021.  BP still labile and had had recurrent syncopal episodes that were not arrhythmogenic.  Already has a pacemaker.  Had tried compression socks and abdominal binder in the past without improvement.  Also tried salt loading without improvement.  Has midodrine, bisoprolol, and losartan instructions depending on what her systolic blood pressure readings are.  No renal artery stenosis on 09/04/2020 ultrasound.  She recommended referral to [facility] Brittney Axe, MD is EP Brittney Cooper, Brittney Cooper is endocrinology provider   LABS: For day of surgery as indicated. A1c 8.3% on 05/17/22 ([facility]). CBC normal and Cr 0.81 on 04/20/21.    IMAGES: Xray right ankle 05/20/22 ([facility]): Impression: 1.  No acute fracture or malalignment.  2.  There is lateral ankle soft tissue edema.  3.  There is plate and screw fixation of the distal fibula.  NM Solid Gastric Emptying Study 07/22/21 ([facility]): Impression: Rapid gastric emptying for the test meal used and consumed under current medications   EKG: 12/08/2021: Normal sinus rhythm with sinus arrhythmia.  Nonspecific T wave abnormality.   CV: Cardiac event monitor 04/18/21-05/17/21: - Indication: syncope  - Duration: 30d - Findings PVCs Rare, less than 1%   PACs Rare, less than 1%    repeated VT detected by  device more consistent with V pacing  - Symptoms: Syncope x 4 >> sinus  - Conclusions: No arrhythmia noted with syncope consistent with vasomotor event - Recommendations: Continued therapy    Echo 01/23/20: IMPRESSIONS   1. Left ventricular ejection fraction, by visual estimation, is 60 to  65%. The left ventricle has  normal function. There is no left ventricular  hypertrophy.   2. The left ventricle has no regional wall motion abnormalities.   3. Global right ventricle has normal systolic function.The right  ventricular size is normal. No increase in right ventricular wall  thickness.   4. Left atrial size was normal.   5. Right atrial size was normal.   6. The mitral valve is normal in structure. Trivial mitral valve  regurgitation. No evidence of mitral stenosis.   7. The tricuspid valve is normal in structure.   8. The tricuspid valve is normal in structure. Tricuspid valve  regurgitation is not demonstrated.   9. The aortic valve is normal in structure. Aortic valve regurgitation is  not visualized. No evidence of aortic valve sclerosis or stenosis.  10. The pulmonic valve was normal in structure. Pulmonic valve  regurgitation is not visualized.  11. Mildly elevated pulmonary artery systolic pressure.  12. The inferior vena cava is normal in size with greater than 50%  respiratory variability, suggesting right atrial pressure of 3 mmHg.    Tilt Table 11/14/17 ([facility]): Narrative: Abnormal tilt table test post SLNTG - this reproduced her dizziness that  she has at home and was associated with a drop in BP.   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, working on OB floor as a nurse   Syncope     neurocardiogenic   Third degree heart block ([place])     Past Surgical History:  Procedure Laterality Date   ABDOMINAL HYSTERECTOMY     ANTERIOR AND POSTERIOR REPAIR     BUNIONECTOMY Bilateral    CESAREAN SECTION     EP IMPLANTABLE DEVICE N/A 10/12/2016   Procedure: Biotronik Pacemaker Implant;  Surgeon: Will Meredith Leeds, MD;  Location: [place] [facility];  Service: Cardiovascular;  Laterality: N/A;   finfger surgery     FINGER SURGERY     LTCS     MYRINGOTOMY     ORIF ANKLE FRACTURE Right 07/25/2015   Procedure: OPEN REDUCTION INTERNAL FIXATION (ORIF) RIGHT ANKLE ;  Surgeon: Renette Butters, MD;  Location: [place];  Service: Orthopedics;  Laterality: Right;   PACEMAKER PLACEMENT     RHINOPLASTY     TONSILLECTOMY     TUBAL LIGATION      MEDICATIONS: No current facility-administered medications for this encounter.    ALPRAZolam (XANAX) 0.5 MG tablet   amphetamine-dextroamphetamine (  ADDERALL) 30 MG tablet   bisoprolol (ZEBETA) 5 MG tablet   divalproex (DEPAKOTE ER) 250 MG 24 hr tablet   doxazosin (CARDURA) 1 MG tablet   DULoxetine (CYMBALTA) 60 MG capsule   fenofibrate (TRICOR) [id number] MG tablet   folic acid (FOLVITE) 1 MG tablet   gabapentin (NEURONTIN) 300 MG capsule   Amantadine HCl 100 MG tablet   aspirin EC 81 MG tablet   benztropine (COGENTIN) 1 MG tablet   calcium carbonate (OSCAL) 1500 (600 Ca) MG TABS tablet   flecainide (TAMBOCOR) 50 MG tablet   gabapentin (NEURONTIN) 300 MG capsule   glipiZIDE (GLUCOTROL XL) 5 MG 24 hr tablet   ibuprofen (ADVIL) 200 MG tablet   losartan (COZAAR) 25 MG tablet   magnesium gluconate (MAGONATE) 500 MG tablet   metFORMIN (GLUCOPHAGE) 1000 MG tablet   midodrine (PROAMATINE) 2.5 MG tablet   mupirocin ointment (BACTROBAN) 2 %   naproxen sodium (ALEVE) 220 MG tablet   omeprazole (PRILOSEC) 40 MG capsule   pantoprazole (PROTONIX) 40 MG tablet   QUEtiapine (SEROQUEL) 100 MG tablet   rOPINIRole (REQUIP) 0.5 MG tablet    rosuvastatin (CRESTOR) 20 MG tablet   thiamine 100 MG tablet    Myra Gianotti, PA-C Surgical Short Stay/Anesthesiology [facility] Phone [phone] [facility] Phone [phone] 05/21/2022 4:10 PM

## 2022-05-21 NOTE — Telephone Encounter (Signed)
Pt has been scheduled as URGENT ADD ON FOR SURGERY FOR 05/23/22. Med rec and consent are done.

## 2022-05-21 NOTE — Progress Notes (Signed)
Virtual Visit via Telephone Note   Because of Brittney Cooper's co-morbid illnesses, she is at least at moderate risk for complications without adequate follow up.  This format is felt to be most appropriate for this patient at this time.  The patient did not have access to video technology/had technical difficulties with video requiring transitioning to audio format only (telephone).  All issues noted in this document were discussed and addressed.  No physical exam could be performed with this format.  Please refer to the patient's chart for her consent to [facility].  Evaluation Performed:  Preoperative cardiovascular risk assessment _____________   Date:  05/21/2022   Patient ID:  Brittney Cooper, DOB 08/19/1961, MRN [id number] Patient Location:  Home Provider location:   Office  Primary Care Provider:  Cheron SchaumannVelazquez, [name] Y., MD Primary Cardiologist:  Chilton Siiffany Perryman, MD  Chief Complaint / Patient Profile   61 y.o. y/o female with a h/o syncope, atrial tachycardia, dysautonomia, orthostatic hypertension, tobacco use who is pending I&D of abscess of the right ankle and presents today for telephonic preoperative cardiovascular risk assessment.  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   Allergy    Anxiety    Arthritis    Bipolar disorder (HCC)    Cardiac arrest (HCC) 06-2015, 08-2016   x3- pt has pacemaker    Chronic pain    back   Depression    Diabetes mellitus without complication (HCC)    Fatty liver    Fracture of lateral malleolus of right fibula    Hepatomegaly    Hyperlipidemia    Hypertension    OCD (obsessive compulsive disorder)    Pancreatic pseudocyst    Panic attack    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, working on OB floor as a nurse   Syncope    neurocardiogenic    Third degree heart block (HCC)    Past Surgical History:  Procedure Laterality Date   ABDOMINAL HYSTERECTOMY     ANTERIOR AND POSTERIOR REPAIR     BUNIONECTOMY Bilateral    CESAREAN SECTION     EP IMPLANTABLE DEVICE N/A 10/12/2016   Procedure: Biotronik Pacemaker Implant;  Surgeon: Will Jorja LoaMartin Camnitz, MD;  Location: [facility];  Service: Cardiovascular;  Laterality: N/A;   finfger surgery     FINGER SURGERY     LTCS     MYRINGOTOMY     ORIF ANKLE FRACTURE Right 07/25/2015   Procedure: OPEN REDUCTION INTERNAL FIXATION (ORIF) RIGHT ANKLE ;  Surgeon: Sheral Apleyimothy D Murphy, MD;  Location: [facility];  Service: Orthopedics;  Laterality: Right;   PACEMAKER PLACEMENT     RHINOPLASTY     TONSILLECTOMY     TUBAL LIGATION      Allergies  Allergies  Allergen Reactions   Codeine Nausea And Vomiting   Demerol [Meperidine] Other (See Comments)    Hallucinations    Jardiance [Empagliflozin] Other (See Comments)    Yeast infections   Lopressor [Metoprolol] Other (See Comments)    nightmares    History of Present Illness    Brittney Cooper is a 61 y.o. female who presents via audio/video conferencing for a telehealth visit today.  Pt was last seen in cardiology clinic on 12/08/2021 by Dr. Duke Salviaandolph.  At that time Brittney MayhewMaria C  Cooper was referred to [facility] for syncope for dysautonomia.  The patient is now pending procedure as outlined above. Since her last visit, she has had  improvement with her blood pressure.  She reports systolic blood pressures in the 110s over the past 2 days.  Today she denies chest pain, shortness of breath, lower extremity edema, fatigue, palpitations, melena, hematuria, hemoptysis, diaphoresis, weakness, presyncope, syncope, orthopnea, and PND.   Home Medications    Prior to Admission medications   Medication Sig Start Date End Date Taking? Authorizing Provider  ALPRAZolam Prudy Feeler) 0.5 MG tablet Take 0.25-0.5 mg by mouth 3 (three) times daily as needed  for anxiety.    [provider]  Amantadine HCl 100 MG tablet Take 50-100 mg by mouth 2 (two) times daily. 05/19/22   [provider]  amphetamine-dextroamphetamine (ADDERALL) 30 MG tablet Take 30 mg by mouth in the morning and at bedtime.    [provider]  aspirin EC 81 MG tablet Take 1 tablet (81 mg total) by mouth daily. For heart health 02/09/17   Armandina Stammer I, NP  benztropine (COGENTIN) 1 MG tablet Take 0.5-1 mg by mouth 2 (two) times daily. 05/13/22   [provider]  bisoprolol (ZEBETA) 5 MG tablet TAKE 1 TABLET BY MOUTH DAILY Patient taking differently: Take 5 mg by mouth daily as needed (for systolic bp greater than 131.). 12/14/21   Chilton Si, MD  calcium carbonate (OSCAL) 1500 (600 Ca) MG TABS tablet Take by mouth 2 (two) times daily with a meal.    [provider]  divalproex (DEPAKOTE ER) 250 MG 24 hr tablet Take 250 mg by mouth every evening. 05/12/22   [provider]  doxazosin (CARDURA) 1 MG tablet TAKE 1 TABLET BY MOUTH AT BEDTIME 01/15/22   Duke Salvia, MD  DULoxetine (CYMBALTA) 60 MG capsule Take 60 mg by mouth in the morning.    [provider]  fenofibrate (TRICOR) 145 MG tablet Take 145 mg by mouth daily. 03/21/21   [provider]  flecainide (TAMBOCOR) 50 MG tablet TAKE ONE (1) TABLET BY MOUTH TWO (2) TIMES DAILY 11/09/21   Chilton Si, MD  folic acid (FOLVITE) 1 MG tablet Take 1 mg by mouth in the morning.    [provider]  gabapentin (NEURONTIN) 300 MG capsule Take 300 mg by mouth at bedtime. 12/14/21   [provider]  gabapentin (NEURONTIN) 300 MG capsule Take 300 mg by mouth at bedtime.    [provider]  glipiZIDE (GLUCOTROL XL) 5 MG 24 hr tablet Take 5 mg by mouth daily. 05/17/22   [provider]  ibuprofen (ADVIL) 200 MG tablet Take 400 mg by mouth in the morning and at bedtime. Patient not taking: Reported on 02/10/2022    [provider]  losartan (COZAAR) 25 MG tablet TAKE ONE (1) TABLET BY MOUTH EACH DAY Patient taking differently: daily as needed. 11/09/21   Chilton Si, MD  magnesium gluconate (MAGONATE) 500 MG tablet Take 500 mg by mouth daily.    [provider]  metFORMIN (GLUCOPHAGE) 1000 MG tablet Take 1,000 mg by mouth 2 (two) times daily. 12/25/19   [provider]  midodrine (PROAMATINE) 2.5 MG tablet Take 1 tablet (2.5 mg total) by mouth 2 (two) times daily as needed. Patient taking differently: Take 2.5 mg by mouth as needed. 12/08/21   Chilton Si, MD  mupirocin ointment (BACTROBAN) 2 % Apply topically 2 (two) times daily. 05/20/22   [provider]  naproxen sodium (ALEVE) 220 MG tablet Take 220 mg by mouth 2 (two) times daily as needed.    [provider]  omeprazole (PRILOSEC) 40 MG capsule Take 40 mg by mouth 2 (two) times daily.    [provider]  pantoprazole (PROTONIX) 40 MG tablet Take 40 mg by mouth 2 (two) times daily. 05/20/22   [provider]  QUEtiapine (SEROQUEL) 100 MG tablet Take 200 mg by mouth at bedtime.     [provider]  rOPINIRole (REQUIP) 0.5 MG tablet Take 0.5 mg by mouth at bedtime. Patient not taking: Reported on 02/10/2022    [provider]  rosuvastatin (CRESTOR) 20 MG tablet Take 20 mg by mouth daily.    [provider]  thiamine 100 MG tablet Take 1 tablet (100 mg total) by mouth daily. For low thiamine Patient not taking: Reported on 02/10/2022 02/09/17   Sanjuana Kava, NP    Physical Exam    Vital Signs:  Brittney Cooper does not have vital signs available for review today.  Given telephonic nature of communication, physical exam is limited. AAOx3. NAD. Normal affect.  Speech and respirations are unlabored.  Accessory Clinical Findings    None  Assessment & Plan    1.  Preoperative Cardiovascular Risk Assessment: I&D of abscess on right ankle, Dr. Dion Saucier      Primary Cardiologist: Chilton Si, MD  Chart reviewed as part of pre-operative protocol coverage. Given past medical history and time since last visit, based on ACC/AHA guidelines, Brittney Cooper would be at acceptable risk for the planned procedure without further cardiovascular testing.   Patient was advised that if she develops new symptoms prior to surgery to contact our office to arrange a follow-up appointment.  She verbalized understanding.  Her RCRI is a class I risk, 0.4% risk of major cardiac event.  She is able to complete greater than 4 METS of physical activity.  Her aspirin is not prescribed by cardiology provider.  Recommendations for holding aspirin will need to come from prescribing provider.    A copy of this note will be routed to requesting surgeon.  Time:   Today, I have spent 5 minutes with the patient with telehealth technology discussing medical history, symptoms, and management plan.  Prior to her phone evaluation I spent greater than 10 minutes reviewing her past medical history and  medications.   Ronney Asters, NP  05/21/2022, 3:02 PM

## 2022-05-21 NOTE — Progress Notes (Signed)
TWO VISITORS ARE ALLOWED TO COME WITH YOU AND STAY IN THE SURGICAL WAITING ROOM ONLY DURING PRE OP AND PROCEDURE DAY OF SURGERY.   PCP/Internal Med - Dr [name] Cardiologist - Dr Chilton Si Endocrinology -  Alesia Richards, ANP  Chest x-ray - n/a EKG - 12/08/21 Stress Test - n/a ECHO - 01/23/20 Cardiac Cath - n/a  ICD Pacemaker - Yes, Biotronik Pacemaker.  Last remote device check was on 04/06/22.  ICD Programming Orders iniciated  Sleep Study -  n/a CPAP - none  Do not take Glipizide or Metformin on the morning of surgery.  If your blood sugar is less than 70 mg/dL, you will need to treat for low blood sugar: Treat a low blood sugar (less than 70 mg/dL) with  cup of clear juice (cranberry or apple), 4 glucose tablets, OR glucose gel. Recheck blood sugar in 15 minutes after treatment (to make sure it is greater than 70 mg/dL). If your blood sugar is not greater than 70 mg/dL on recheck, call [phone] for further instructions.  Aspirin Instructions: Follow your surgeon's instructions on when to stop aspirin prior to surgery,  If no instructions were given by your surgeon then you will need to call the office for those instructions.  Anesthesia review: Yes  STOP now taking any Aspirin (unless otherwise instructed by your surgeon), Aleve, Naproxen, Ibuprofen, Motrin, Advil, Goody's, BC's, all herbal medications, fish oil, and all vitamins.   Coronavirus Screening Do you have any of the following symptoms:  Cough yes/no: No Fever (>100.12F)  yes/no: No Runny nose yes/no: No Sore throat yes/no: No Difficulty breathing/shortness of breath  yes/no: No  Have you traveled in the last 14 days and where? yes/no: No  Patient verbalized understanding of instructions that were given via phone.

## 2022-05-21 NOTE — Progress Notes (Signed)
PCP - Dr Vilma Meckel Cardiologist - Dr Skeet Latch  Chest x-ray - n/a EKG - 12/08/21 Stress Test - n/a ECHO - 01/22/21 Cardiac Cath - n/a  ICD Pacemaker - Yes, Biotroniks.  Last remote check was on 04/07/22.  Rep Ruby Cola [phone] notified of surgery date/time.  Perioperative Prescription for ICD was implemented and faxed.  Sleep Study -  n/a CPAP - none  Do not take Glipizide or metformin on the morning of surgery.  If your blood sugar is less than 70 mg/dL, you will need to treat for low blood sugar: Treat a low blood sugar (less than 70 mg/dL) with  cup of clear juice (cranberry or apple), 4 glucose tablets, OR glucose gel. Recheck blood sugar in 15 minutes after treatment (to make sure it is greater than 70 mg/dL). If your blood sugar is not greater than 70 mg/dL on recheck, call [phone] for further instructions.  Aspirin Instructions: Follow your surgeon's instructions on when to stop aspirin prior to surgery,  If no instructions were given by your surgeon then you will need to call the office for those instructions.  Anesthesia review: Yes  STOP now taking any Aspirin (unless otherwise instructed by your surgeon), Aleve, Naproxen, Ibuprofen, Motrin, Advil, Goody's, BC's, all herbal medications, fish oil, and all vitamins.   Coronavirus Screening Do you have any of the following symptoms:  Cough yes/no: No Fever (>100.28F)  yes/no: No Runny nose yes/no: No Sore throat yes/no: No Difficulty breathing/shortness of breath  yes/no: No  Have you traveled in the last 14 days and where? yes/no: No  Patient verbalized understanding of instructions that were given via phone.

## 2022-05-21 NOTE — Telephone Encounter (Signed)
Pt has been scheduled as URGENT ADD ON FOR SURGERY FOR 05/23/22. Med rec and consent are done.     Patient Consent for Virtual Visit        Brittney Cooper has provided verbal consent on 05/21/2022 for a virtual visit (video or telephone).   CONSENT FOR VIRTUAL VISIT FOR:  Brittney Cooper  By participating in this virtual visit I agree to the following:  I hereby voluntarily request, consent and authorize [facility] and its employed or contracted physicians, physician assistants, nurse practitioners or other licensed health care professionals (the Practitioner), to provide me with telemedicine health care services (the "Services") as deemed necessary by the treating Practitioner. I acknowledge and consent to receive the Services by the Practitioner via telemedicine. I understand that the telemedicine visit will involve communicating with the Practitioner through live audiovisual communication technology and the disclosure of certain medical information by electronic transmission. I acknowledge that I have been given the opportunity to request an in-person assessment or other available alternative prior to the telemedicine visit and am voluntarily participating in the telemedicine visit.  I understand that I have the right to withhold or withdraw my consent to the use of telemedicine in the course of my care at any time, without affecting my right to future care or treatment, and that the Practitioner or I may terminate the telemedicine visit at any time. I understand that I have the right to inspect all information obtained and/or recorded in the course of the telemedicine visit and may receive copies of available information for a reasonable fee.  I understand that some of the potential risks of receiving the Services via telemedicine include:  Delay or interruption in medical evaluation due to technological equipment failure or disruption; Information transmitted may not be sufficient (e.g. poor  resolution of images) to allow for appropriate medical decision making by the Practitioner; and/or  In rare instances, security protocols could fail, causing a breach of personal health information.  Furthermore, I acknowledge that it is my responsibility to provide information about my medical history, conditions and care that is complete and accurate to the best of my ability. I acknowledge that Practitioner's advice, recommendations, and/or decision may be based on factors not within their control, such as incomplete or inaccurate data provided by me or distortions of diagnostic images or specimens that may result from electronic transmissions. I understand that the practice of medicine is not an exact science and that Practitioner makes no warranties or guarantees regarding treatment outcomes. I acknowledge that a copy of this consent can be made available to me via my patient portal [facility] MyChart), or I can request a printed copy by calling the office of [facility].    I understand that my insurance will be billed for this visit.   I have read or had this consent read to me. I understand the contents of this consent, which adequately explains the benefits and risks of the Services being provided via telemedicine.  I have been provided ample opportunity to ask questions regarding this consent and the Services and have had my questions answered to my satisfaction. I give my informed consent for the services to be provided through the use of telemedicine in my medical care

## 2022-05-21 NOTE — Anesthesia Preprocedure Evaluation (Signed)
Anesthesia Evaluation  Patient identified by MRN, date of birth, ID band Patient awake    Reviewed: Allergy & Precautions, NPO status , Patient's Chart, lab work & pertinent test results, reviewed documented beta blocker date and time   Airway Mallampati: II  TM Distance: >3 FB Neck ROM: Full    Dental  (+) Dental Advisory Given, Chipped   Pulmonary Current SmokerPatient did not abstain from smoking.,    Pulmonary exam normal breath sounds clear to auscultation       Cardiovascular hypertension, Pt. on home beta blockers and Pt. on medications Normal cardiovascular exam+ dysrhythmias + pacemaker + Cardiac Defibrillator  Rhythm:Regular Rate:Normal     Neuro/Psych PSYCHIATRIC DISORDERS Anxiety Depression Bipolar Disorder  Neuromuscular disease    GI/Hepatic GERD  Medicated,(+)     substance abuse  alcohol use,   Endo/Other  diabetes, Type 2, Oral Hypoglycemic Agents  Renal/GU negative Renal ROS     Musculoskeletal  (+) Arthritis , INFECTED RIGHT ANKLE   Abdominal   Peds  (+) ADHD Hematology negative hematology ROS (+)   Anesthesia Other Findings Day of surgery medications reviewed with the patient.  Reproductive/Obstetrics                           Anesthesia Physical Anesthesia Plan  ASA: 4  Anesthesia Plan: General   Post-op Pain Management: Tylenol PO (pre-op)*   Induction: Intravenous  PONV Risk Score and Plan: 2 and Midazolam, Dexamethasone and Ondansetron  Airway Management Planned: LMA  Additional Equipment:   Intra-op Plan:   Post-operative Plan: Extubation in OR  Informed Consent: I have reviewed the patients History and Physical, chart, labs and discussed the procedure including the risks, benefits and alternatives for the proposed anesthesia with the patient or authorized representative who has indicated his/her understanding and acceptance.     Dental advisory  given  Plan Discussed with: CRNA  Anesthesia Plan Comments: (PAT note written 05/21/2022 by Shonna Chock, PA-C. )       Anesthesia Quick Evaluation

## 2022-05-21 NOTE — H&P (Signed)
PREOPERATIVE H&P  Chief Complaint: INFECTED RIGHT ANKLE  HPI: Brittney Cooper is a 61 y.o. female who presents with a diagnosis of INFECTED RIGHT ANKLE. Symptoms are rated as moderate to severe, and have been worsening.  This is significantly impairing activities of daily living.  She has elected for surgical management.   Past Medical History:  Diagnosis Date   ADHD (attention deficit hyperactivity disorder)    AICD (automatic cardioverter/defibrillator) present    Biotroniks Pacemaker   Alcohol abuse    Alcoholic pancreatitis [id number], 11/16, 7/17   Allergy    Anxiety    Arthritis    Bipolar disorder ([place])    Cardiac arrest ([place]) 06-2015, 08-2016   x3- pt has pacemaker    Chronic pain    back   Complication of anesthesia    woke up during 2 surgical procedures   Depression    Diabetes mellitus without complication ([place])    Type 2   Fatty liver    Fracture of lateral malleolus of right fibula    GERD (gastroesophageal reflux disease)    Hepatomegaly    Hyperlipidemia    Hypertension    Neuromuscular disorder (HCC)    Dystonia   OCD (obsessive compulsive disorder)    Pancreatic pseudocyst    Panic attack    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, working on OB floor as a nurse   Syncope    neurocardiogenic   Third degree heart block ([place])    Past Surgical History:  Procedure Laterality Date   ABDOMINAL HYSTERECTOMY     ANTERIOR AND POSTERIOR REPAIR     BUNIONECTOMY Bilateral    CESAREAN SECTION     EP IMPLANTABLE DEVICE N/A 10/12/2016   Procedure: Biotronik Pacemaker Implant;  Surgeon: Will Meredith Leeds, MD;  Location: [facility];  Service: Cardiovascular;  Laterality: N/A;   finfger surgery     FINGER SURGERY     LTCS     MYRINGOTOMY     ORIF ANKLE FRACTURE Right 07/25/2015   Procedure: OPEN REDUCTION INTERNAL FIXATION (ORIF) RIGHT ANKLE ;  Surgeon: Renette Butters, MD;  Location: [facility];  Service:  Orthopedics;  Laterality: Right;   PACEMAKER PLACEMENT     RHINOPLASTY     TONSILLECTOMY     TUBAL LIGATION     Social History   Socioeconomic History   Marital status: Divorced    Spouse name: Not on file   Number of children: Not on file   Years of education: Not on file   Highest education level: Not on file  Occupational History   Occupation: RN  Tobacco Use   Smoking status: Every Day    Packs/day: 1.00    Years: 15.00    Pack years: 15.00    Types: Cigarettes   Smokeless tobacco: Never   Tobacco comments:    1/2 -1 ppd  Vaping Use   Vaping Use: Former   Substances: Nicotine  Substance and Sexual Activity   Alcohol use: Yes    Comment: social   Drug use: No   Sexual activity: Not Currently    Birth control/protection: Surgical, Abstinence    Comment: Hysterectomy  Other Topics Concern   Not on file  Social History Narrative   Not on file   Social Determinants of Health   Financial Resource Strain: Not on file  Food Insecurity: Not on file  Transportation Needs: Not on file  Physical Activity: Not  on file  Stress: Not on file  Social Connections: Not on file   Family History  Problem Relation Age of Onset   Stroke Mother    Kidney disease Mother    Colon polyps Mother    Hypertension Mother    Kidney failure Mother    Lung cancer Father        stage IV    Colon polyps Father    Hypertension Father    Stomach cancer Maternal Grandfather    Colon cancer Paternal Grandfather    Hypertension Sister    Hyperlipidemia Sister    Hypertension Brother    Hyperlipidemia Brother    Rectal cancer Neg Hx    Esophageal cancer Neg Hx    Pancreatic cancer Neg Hx    Allergies  Allergen Reactions   Codeine Nausea And Vomiting   Demerol [Meperidine] Other (See Comments)    Hallucinations    Jardiance [Empagliflozin] Other (See Comments)    Yeast infections   Lopressor [Metoprolol] Other (See Comments)    nightmares   Prior to Admission medications    Medication Sig Start Date End Date Taking? Authorizing Provider  ALPRAZolam Duanne Moron) 0.5 MG tablet Take 0.25-0.5 mg by mouth 3 (three) times daily as needed for anxiety.   Yes [provider]  amphetamine-dextroamphetamine (ADDERALL) 30 MG tablet Take 30 mg by mouth 2 (two) times daily. TWICE DAILY; DOES NOT TAKE 2ND DOSE AFTER 3 PM   Yes [provider]  bisoprolol (ZEBETA) 5 MG tablet TAKE 1 TABLET BY MOUTH DAILY Patient taking differently: Take 5 mg by mouth daily as needed (for systolic bp greater than [id number].). 12/14/21  Yes Skeet Latch, MD  divalproex (DEPAKOTE ER) 250 MG 24 hr tablet Take 250 mg by mouth every evening. 05/12/22  Yes [provider]  doxazosin (CARDURA) 1 MG tablet TAKE 1 TABLET BY MOUTH AT BEDTIME 01/15/22  Yes Deboraha Sprang, MD  DULoxetine (CYMBALTA) 60 MG capsule Take 60 mg by mouth in the morning.   Yes [provider]  fenofibrate (TRICOR) 145 MG tablet Take 145 mg by mouth daily. 03/21/21  Yes [provider]  folic acid (FOLVITE) 1 MG tablet Take 1 mg by mouth in the morning.   Yes [provider]  gabapentin (NEURONTIN) 300 MG capsule Take 300 mg by mouth at bedtime. Patient not taking: Reported on 05/21/2022   Yes [provider]  Amantadine HCl 100 MG tablet Take 50-100 mg by mouth 2 (two) times daily. 05/19/22   [provider]  aspirin EC 81 MG tablet Take 1 tablet (81 mg total) by mouth daily. For heart health 02/09/17   Lindell Spar I, NP  benztropine (COGENTIN) 1 MG tablet Take 0.5-1 mg by mouth 2 (two) times daily. Patient not taking: Reported on 05/21/2022 05/13/22   [provider]  calcium carbonate (OSCAL) 1500 (600 Ca) MG TABS tablet Take by mouth 2 (two) times daily with a meal. Patient not taking: Reported on 05/21/2022    [provider]  flecainide (TAMBOCOR) 50 MG tablet TAKE ONE (1) TABLET BY MOUTH TWO (2) TIMES DAILY 11/09/21   Skeet Latch, MD  gabapentin  (NEURONTIN) 300 MG capsule Take 300 mg by mouth at bedtime. 12/14/21   [provider]  glipiZIDE (GLUCOTROL XL) 5 MG 24 hr tablet Take 5 mg by mouth daily. 05/17/22   [provider]  ibuprofen (ADVIL) 200 MG tablet Take 400 mg by mouth in the morning and at bedtime. Patient  not taking: Reported on 02/10/2022    [provider]  losartan (COZAAR) 25 MG tablet TAKE ONE (1) [place] DAY Patient taking differently: daily as needed. 11/09/21   Skeet Latch, MD  magnesium gluconate (MAGONATE) 500 MG tablet Take 500 mg by mouth daily.    [provider]  metFORMIN (GLUCOPHAGE) 1000 MG tablet Take 1,000 mg by mouth 2 (two) times daily. 12/25/19   [provider]  midodrine (PROAMATINE) 2.5 MG tablet Take 1 tablet (2.5 mg total) by mouth 2 (two) times daily as needed. Patient taking differently: Take 2.5 mg by mouth as needed. 12/08/21   Skeet Latch, MD  mupirocin ointment (BACTROBAN) 2 % Apply topically 2 (two) times daily. 05/20/22   [provider]  naproxen sodium (ALEVE) 220 MG tablet Take 220 mg by mouth 2 (two) times daily as needed.    [provider]  omeprazole (PRILOSEC) 40 MG capsule Take 40 mg by mouth 2 (two) times daily.    [provider]  pantoprazole (PROTONIX) 40 MG tablet Take 40 mg by mouth 2 (two) times daily. Patient not taking: Reported on 05/21/2022 05/20/22   [provider]  QUEtiapine (SEROQUEL) 100 MG tablet Take 200 mg by mouth at bedtime.     [provider]  rOPINIRole (REQUIP) 0.5 MG tablet Take 0.5 mg by mouth at bedtime. Patient not taking: Reported on 02/10/2022    [provider]  rosuvastatin (CRESTOR) 20 MG tablet Take 20 mg by mouth daily.    [provider]  thiamine 100 MG tablet Take 1 tablet (100 mg total) by mouth daily. For low thiamine Patient not taking: Reported on 02/10/2022 02/09/17   Lindell Spar I, NP     Positive ROS: All  other systems have been reviewed and were otherwise negative with the exception of those mentioned in the HPI and as above.  Physical Exam: General: Alert, no acute distress Cardiovascular: No pedal edema Respiratory: No cyanosis, no use of accessory musculature GI: No organomegaly, abdomen is soft and non-tender Skin: No lesions in the area of chief complaint Neurologic: Sensation intact distally Psychiatric: Patient is competent for consent with normal mood and affect Lymphatic: No axillary or cervical lymphadenopathy  MUSCULOSKELETAL: right ankle TTP, edema present, decreased ROM, prior incision c/d/I, NVI   Imaging: xrays show lateral soft tissue edema but no fracture, stable hardware from prior ORIF   Assessment: INFECTED RIGHT ANKLE  Plan: Plan for Procedure(s): INCISION AND DRAINAGE ABSCESS, RIGHT ANKLE  The risks benefits and alternatives were discussed with the patient including but not limited to the risks of nonoperative treatment, versus surgical intervention including infection, bleeding, nerve injury,  blood clots, cardiopulmonary complications, morbidity, mortality, among others, and they were willing to proceed.   Weightbearing: NWB vs. WBAT Orthopedic devices: possible splint Showering: POD 3, keep wound dry Dressing: reinforce PRN Medicines: ASA, Norco 10, Mobic, Zofran; ABX per ID    Will also plan to order PICC line placement and ID consult on day of surgery.     Britt Bottom, [place] Office [phone] 05/21/2022 5:05 PM

## 2022-05-23 ENCOUNTER — Encounter (HOSPITAL_COMMUNITY): Payer: Self-pay | Admitting: Orthopedic Surgery

## 2022-05-23 ENCOUNTER — Ambulatory Visit (HOSPITAL_BASED_OUTPATIENT_CLINIC_OR_DEPARTMENT_OTHER): Payer: Medicare Other | Admitting: Physician Assistant

## 2022-05-23 ENCOUNTER — Observation Stay (HOSPITAL_COMMUNITY): Payer: Medicare Other

## 2022-05-23 ENCOUNTER — Observation Stay: Payer: Self-pay

## 2022-05-23 ENCOUNTER — Inpatient Hospital Stay (HOSPITAL_COMMUNITY)
Admission: AD | Admit: 2022-05-23 | Discharge: 2022-05-25 | DRG: 493 | Disposition: A | Discharge status: Home Health | Payer: Medicare Other | Attending: Orthopedic Surgery | Admitting: Orthopedic Surgery

## 2022-05-23 ENCOUNTER — Other Ambulatory Visit: Payer: Self-pay

## 2022-05-23 ENCOUNTER — Encounter (HOSPITAL_COMMUNITY)
Admission: AD | Disposition: A | Discharge status: Home Health | Payer: Self-pay | Source: Home / Self Care | Attending: Orthopedic Surgery

## 2022-05-23 ENCOUNTER — Ambulatory Visit (HOSPITAL_COMMUNITY): Payer: Medicare Other | Admitting: Physician Assistant

## 2022-05-23 ENCOUNTER — Ambulatory Visit: Payer: Self-pay

## 2022-05-23 DIAGNOSIS — Z7982 Long term (current) use of aspirin: Secondary | ICD-10-CM

## 2022-05-23 DIAGNOSIS — E1169 Type 2 diabetes mellitus with other specified complication: Secondary | ICD-10-CM

## 2022-05-23 DIAGNOSIS — I442 Atrioventricular block, complete: Secondary | ICD-10-CM | POA: Diagnosis not present

## 2022-05-23 DIAGNOSIS — M869 Osteomyelitis, unspecified: Secondary | ICD-10-CM | POA: Diagnosis not present

## 2022-05-23 DIAGNOSIS — F909 Attention-deficit hyperactivity disorder, unspecified type: Secondary | ICD-10-CM | POA: Diagnosis present

## 2022-05-23 DIAGNOSIS — L02419 Cutaneous abscess of limb, unspecified: Secondary | ICD-10-CM

## 2022-05-23 DIAGNOSIS — T84624A Infection and inflammatory reaction due to internal fixation device of right fibula, initial encounter: Principal | ICD-10-CM | POA: Diagnosis present

## 2022-05-23 DIAGNOSIS — W540XXA Bitten by dog, initial encounter: Secondary | ICD-10-CM

## 2022-05-23 DIAGNOSIS — Z7984 Long term (current) use of oral hypoglycemic drugs: Secondary | ICD-10-CM | POA: Diagnosis not present

## 2022-05-23 DIAGNOSIS — M86061 Acute hematogenous osteomyelitis, right tibia and fibula: Secondary | ICD-10-CM | POA: Diagnosis present

## 2022-05-23 DIAGNOSIS — L02415 Cutaneous abscess of right lower limb: Secondary | ICD-10-CM

## 2022-05-23 DIAGNOSIS — Z79899 Other long term (current) drug therapy: Secondary | ICD-10-CM

## 2022-05-23 DIAGNOSIS — Z83438 Family history of other disorder of lipoprotein metabolism and other lipidemia: Secondary | ICD-10-CM

## 2022-05-23 DIAGNOSIS — G8929 Other chronic pain: Secondary | ICD-10-CM | POA: Diagnosis present

## 2022-05-23 DIAGNOSIS — R55 Syncope and collapse: Secondary | ICD-10-CM

## 2022-05-23 DIAGNOSIS — T847XXA Infection and inflammatory reaction due to other internal orthopedic prosthetic devices, implants and grafts, initial encounter: Secondary | ICD-10-CM | POA: Diagnosis present

## 2022-05-23 DIAGNOSIS — F419 Anxiety disorder, unspecified: Secondary | ICD-10-CM | POA: Diagnosis present

## 2022-05-23 DIAGNOSIS — K219 Gastro-esophageal reflux disease without esophagitis: Secondary | ICD-10-CM | POA: Diagnosis present

## 2022-05-23 DIAGNOSIS — E785 Hyperlipidemia, unspecified: Secondary | ICD-10-CM | POA: Diagnosis present

## 2022-05-23 DIAGNOSIS — Y831 Surgical operation with implant of artificial internal device as the cause of abnormal reaction of the patient, or of later complication, without mention of misadventure at the time of the procedure: Secondary | ICD-10-CM | POA: Diagnosis present

## 2022-05-23 DIAGNOSIS — F17210 Nicotine dependence, cigarettes, uncomplicated: Secondary | ICD-10-CM | POA: Diagnosis present

## 2022-05-23 DIAGNOSIS — Z888 Allergy status to other drugs, medicaments and biological substances status: Secondary | ICD-10-CM

## 2022-05-23 DIAGNOSIS — F319 Bipolar disorder, unspecified: Secondary | ICD-10-CM | POA: Diagnosis present

## 2022-05-23 DIAGNOSIS — M86171 Other acute osteomyelitis, right ankle and foot: Secondary | ICD-10-CM

## 2022-05-23 DIAGNOSIS — F4310 Post-traumatic stress disorder, unspecified: Secondary | ICD-10-CM | POA: Diagnosis present

## 2022-05-23 DIAGNOSIS — I10 Essential (primary) hypertension: Secondary | ICD-10-CM | POA: Diagnosis present

## 2022-05-23 DIAGNOSIS — Z95810 Presence of automatic (implantable) cardiac defibrillator: Secondary | ICD-10-CM

## 2022-05-23 DIAGNOSIS — Z8249 Family history of ischemic heart disease and other diseases of the circulatory system: Secondary | ICD-10-CM

## 2022-05-23 DIAGNOSIS — Z885 Allergy status to narcotic agent status: Secondary | ICD-10-CM

## 2022-05-23 HISTORY — DX: Other complications of anesthesia, initial encounter: T88.59XA

## 2022-05-23 HISTORY — PX: HARDWARE REMOVAL: SHX979

## 2022-05-23 HISTORY — DX: Myoneural disorder, unspecified: G70.9

## 2022-05-23 HISTORY — PX: INCISION AND DRAINAGE ABSCESS: SHX5864

## 2022-05-23 HISTORY — DX: Presence of automatic (implantable) cardiac defibrillator: Z95.810

## 2022-05-23 HISTORY — DX: Gastro-esophageal reflux disease without esophagitis: K21.9

## 2022-05-23 LAB — COMPREHENSIVE METABOLIC PANEL
ALT: 23 U/L (ref 0–44)
AST: 16 U/L (ref 15–41)
Albumin: 3.7 g/dL (ref 3.5–5.0)
Alkaline Phosphatase: 91 U/L (ref 38–126)
Anion gap: 8 (ref 5–15)
BUN: 16 mg/dL (ref 6–20)
CO2: 28 mmol/L (ref 22–32)
Calcium: 9.2 mg/dL (ref 8.9–10.3)
Chloride: 100 mmol/L (ref 98–111)
Creatinine, Ser: 0.65 mg/dL (ref 0.44–1.00)
GFR, Estimated: >60 mL/min (ref >60)
Glucose, Bld: 176 mg/dL — ABNORMAL HIGH (ref 70–99)
Potassium: 3.4 mmol/L — ABNORMAL LOW (ref 3.5–5.1)
Sodium: 136 mmol/L (ref 135–145)
Total Bilirubin: 1.1 mg/dL (ref 0.3–1.2)
Total Protein: 6.5 g/dL (ref 6.5–8.1)

## 2022-05-23 LAB — CBC
HCT: 35.9 % — ABNORMAL LOW (ref 36.0–46.0)
Hemoglobin: 12.8 g/dL (ref 12.0–15.0)
MCH: 34 pg (ref 26.0–34.0)
MCHC: 35.7 g/dL (ref 30.0–36.0)
MCV: 95.2 fL (ref 80.0–100.0)
Platelets: 228 10*3/uL (ref 150–400)
RBC: 3.77 MIL/uL — ABNORMAL LOW (ref 3.87–5.11)
RDW: 13.3 % (ref 11.5–15.5)
WBC: 9.7 10*3/uL (ref 4.0–10.5)
nRBC: 0 % (ref 0.0–0.2)

## 2022-05-23 LAB — GLUCOSE, CAPILLARY: Glucose-Capillary: 245 mg/dL — ABNORMAL HIGH (ref 70–99)

## 2022-05-23 SURGERY — INCISION AND DRAINAGE, ABSCESS
Anesthesia: General | Site: Ankle | Laterality: Right

## 2022-05-23 MED ORDER — DOXAZOSIN MESYLATE 1 MG PO TABS
1.0000 mg | ORAL_TABLET | Freq: Every day | ORAL | Status: DC
  Administered 2022-05-25: 1 mg via ORAL
  Filled 2022-05-23 (×2): qty 1

## 2022-05-23 MED ORDER — HYDROMORPHONE HCL 1 MG/ML IJ SOLN
0.5000 mg | INTRAMUSCULAR | Status: DC | PRN
  Administered 2022-05-23 – 2022-05-25 (×2): 1 mg via INTRAVENOUS
  Filled 2022-05-23 (×2): qty 1

## 2022-05-23 MED ORDER — ONDANSETRON HCL 4 MG/2ML IJ SOLN: 4.0000 mg | Freq: Once | INTRAMUSCULAR | Status: DC | PRN

## 2022-05-23 MED ORDER — KETOROLAC TROMETHAMINE 30 MG/ML IJ SOLN: 30.0000 mg | Freq: Once | INTRAMUSCULAR | Status: AC

## 2022-05-23 MED ORDER — 0.9 % SODIUM CHLORIDE (POUR BTL) OPTIME
TOPICAL | Status: DC | PRN
  Administered 2022-05-23: 1000 mL

## 2022-05-23 MED ORDER — CHLORHEXIDINE GLUCONATE 0.12 % MT SOLN: 15.0000 mL | Freq: Once | OROMUCOSAL | Status: AC

## 2022-05-23 MED ORDER — GLIPIZIDE ER 5 MG PO TB24
5.0000 mg | ORAL_TABLET | Freq: Every day | ORAL | Status: DC
  Administered 2022-05-24 – 2022-05-25 (×2): 5 mg via ORAL
  Filled 2022-05-23 (×2): qty 1

## 2022-05-23 MED ORDER — FENTANYL CITRATE (PF) 250 MCG/5ML IJ SOLN
INTRAMUSCULAR | Status: DC | PRN
  Administered 2022-05-23 (×2): 50 ug via INTRAVENOUS

## 2022-05-23 MED ORDER — DIVALPROEX SODIUM ER 250 MG PO TB24
250.0000 mg | ORAL_TABLET | Freq: Every evening | ORAL | Status: DC
  Administered 2022-05-23 – 2022-05-24 (×2): 250 mg via ORAL
  Filled 2022-05-23 (×3): qty 1

## 2022-05-23 MED ORDER — ONDANSETRON HCL 4 MG PO TABS: 4.0000 mg | ORAL_TABLET | Freq: Four times a day (QID) | ORAL | Status: DC | PRN

## 2022-05-23 MED ORDER — METOCLOPRAMIDE HCL 5 MG PO TABS: 5.0000 mg | ORAL_TABLET | Freq: Three times a day (TID) | ORAL | Status: DC | PRN

## 2022-05-23 MED ORDER — VANCOMYCIN HCL IN DEXTROSE 1-5 GM/200ML-% IV SOLN
1000.0000 mg | Freq: Two times a day (BID) | INTRAVENOUS | Status: DC
  Administered 2022-05-24 (×2): 1000 mg via INTRAVENOUS
  Filled 2022-05-23 (×4): qty 200

## 2022-05-23 MED ORDER — ASPIRIN 81 MG PO TBEC
81.0000 mg | DELAYED_RELEASE_TABLET | Freq: Two times a day (BID) | ORAL | Status: DC
  Administered 2022-05-23 – 2022-05-25 (×4): 81 mg via ORAL
  Filled 2022-05-23 (×4): qty 1

## 2022-05-23 MED ORDER — MAGNESIUM GLUCONATE 500 MG PO TABS
500.0000 mg | ORAL_TABLET | Freq: Every day | ORAL | Status: DC
  Administered 2022-05-23 – 2022-05-25 (×3): 500 mg via ORAL
  Filled 2022-05-23 (×3): qty 1

## 2022-05-23 MED ORDER — DULOXETINE HCL 60 MG PO CPEP
60.0000 mg | ORAL_CAPSULE | Freq: Every morning | ORAL | Status: DC
  Administered 2022-05-23 – 2022-05-25 (×3): 60 mg via ORAL
  Filled 2022-05-23 (×3): qty 1

## 2022-05-23 MED ORDER — DEXAMETHASONE SODIUM PHOSPHATE 10 MG/ML IJ SOLN
INTRAMUSCULAR | Status: DC | PRN
  Administered 2022-05-23: 5 mg via INTRAVENOUS

## 2022-05-23 MED ORDER — ROSUVASTATIN CALCIUM 20 MG PO TABS
20.0000 mg | ORAL_TABLET | Freq: Every day | ORAL | Status: DC
  Administered 2022-05-23 – 2022-05-25 (×3): 20 mg via ORAL
  Filled 2022-05-23 (×3): qty 1

## 2022-05-23 MED ORDER — MIDAZOLAM HCL 2 MG/2ML IJ SOLN
INTRAMUSCULAR | Status: AC
  Filled 2022-05-23: qty 2

## 2022-05-23 MED ORDER — CHLORHEXIDINE GLUCONATE 0.12 % MT SOLN: 15.0000 mL | Freq: Once | OROMUCOSAL | Status: DC

## 2022-05-23 MED ORDER — ALPRAZOLAM 0.25 MG PO TABS: 0.2500 mg | ORAL_TABLET | Freq: Three times a day (TID) | ORAL | Status: DC | PRN

## 2022-05-23 MED ORDER — METOCLOPRAMIDE HCL 5 MG/ML IJ SOLN: 5.0000 mg | Freq: Three times a day (TID) | INTRAMUSCULAR | Status: DC | PRN

## 2022-05-23 MED ORDER — VANCOMYCIN HCL 1000 MG IV SOLR
INTRAVENOUS | Status: AC
  Filled 2022-05-23: qty 20

## 2022-05-23 MED ORDER — LACTATED RINGERS IV SOLN: INTRAVENOUS | Status: DC

## 2022-05-23 MED ORDER — OXYCODONE HCL 5 MG PO TABS
ORAL_TABLET | ORAL | Status: AC
  Filled 2022-05-23: qty 3

## 2022-05-23 MED ORDER — ORAL CARE MOUTH RINSE: 15.0000 mL | Freq: Once | OROMUCOSAL | Status: AC

## 2022-05-23 MED ORDER — ONDANSETRON HCL 4 MG/2ML IJ SOLN
INTRAMUSCULAR | Status: DC | PRN
  Administered 2022-05-23: 4 mg via INTRAVENOUS

## 2022-05-23 MED ORDER — VANCOMYCIN HCL IN DEXTROSE 1-5 GM/200ML-% IV SOLN
1000.0000 mg | Freq: Two times a day (BID) | INTRAVENOUS | Status: DC
  Filled 2022-05-23: qty 200

## 2022-05-23 MED ORDER — METHOCARBAMOL 500 MG PO TABS
500.0000 mg | ORAL_TABLET | Freq: Four times a day (QID) | ORAL | Status: DC | PRN
  Administered 2022-05-24 (×3): 500 mg via ORAL
  Filled 2022-05-23 (×3): qty 1

## 2022-05-23 MED ORDER — QUETIAPINE FUMARATE 200 MG PO TABS
200.0000 mg | ORAL_TABLET | Freq: Every day | ORAL | Status: DC
  Administered 2022-05-23 – 2022-05-24 (×2): 200 mg via ORAL
  Filled 2022-05-23 (×3): qty 1

## 2022-05-23 MED ORDER — METHOCARBAMOL 1000 MG/10ML IJ SOLN: 500.0000 mg | Freq: Four times a day (QID) | INTRAVENOUS | Status: DC | PRN

## 2022-05-23 MED ORDER — CEFAZOLIN SODIUM-DEXTROSE 2-4 GM/100ML-% IV SOLN
INTRAVENOUS | Status: AC
  Filled 2022-05-23: qty 100

## 2022-05-23 MED ORDER — POLYETHYLENE GLYCOL 3350 17 G PO PACK
17.0000 g | PACK | Freq: Every day | ORAL | Status: DC | PRN
  Filled 2022-05-23: qty 1

## 2022-05-23 MED ORDER — ACETAMINOPHEN 500 MG PO TABS
1000.0000 mg | ORAL_TABLET | Freq: Four times a day (QID) | ORAL | Status: AC
  Administered 2022-05-23 – 2022-05-24 (×3): 1000 mg via ORAL
  Filled 2022-05-23 (×3): qty 2

## 2022-05-23 MED ORDER — LIDOCAINE 2% (20 MG/ML) 5 ML SYRINGE
INTRAMUSCULAR | Status: AC
  Filled 2022-05-23: qty 5

## 2022-05-23 MED ORDER — SODIUM CHLORIDE 0.9% FLUSH: 10.0000 mL | INTRAVENOUS | Status: DC | PRN

## 2022-05-23 MED ORDER — SODIUM CHLORIDE 0.9 % IV SOLN
2.0000 g | INTRAVENOUS | Status: DC
Start: 2022-05-23 — End: 2022-05-24
  Administered 2022-05-23: 2 g via INTRAVENOUS
  Filled 2022-05-23: qty 20

## 2022-05-23 MED ORDER — FENTANYL CITRATE (PF) 100 MCG/2ML IJ SOLN: 25.0000 ug | INTRAMUSCULAR | Status: DC | PRN

## 2022-05-23 MED ORDER — PANTOPRAZOLE SODIUM 40 MG PO TBEC
40.0000 mg | DELAYED_RELEASE_TABLET | Freq: Every day | ORAL | Status: DC
  Administered 2022-05-23 – 2022-05-25 (×3): 40 mg via ORAL
  Filled 2022-05-23 (×3): qty 1

## 2022-05-23 MED ORDER — ACETAMINOPHEN 325 MG PO TABS: 325.0000 mg | ORAL_TABLET | Freq: Four times a day (QID) | ORAL | Status: DC | PRN

## 2022-05-23 MED ORDER — LIDOCAINE 2% (20 MG/ML) 5 ML SYRINGE
INTRAMUSCULAR | Status: DC | PRN
  Administered 2022-05-23: 80 mg via INTRAVENOUS

## 2022-05-23 MED ORDER — BISOPROLOL FUMARATE 5 MG PO TABS: 5.0000 mg | ORAL_TABLET | Freq: Every day | ORAL | Status: DC | PRN

## 2022-05-23 MED ORDER — PROPOFOL 10 MG/ML IV BOLUS
INTRAVENOUS | Status: AC
  Filled 2022-05-23: qty 20

## 2022-05-23 MED ORDER — FOLIC ACID 1 MG PO TABS
1.0000 mg | ORAL_TABLET | Freq: Every morning | ORAL | Status: DC
  Administered 2022-05-23 – 2022-05-25 (×3): 1 mg via ORAL
  Filled 2022-05-23 (×3): qty 1

## 2022-05-23 MED ORDER — VANCOMYCIN HCL 1750 MG/350ML IV SOLN
1750.0000 mg | Freq: Once | INTRAVENOUS | Status: AC
  Administered 2022-05-23: 1750 mg via INTRAVENOUS
  Filled 2022-05-23: qty 350

## 2022-05-23 MED ORDER — TRAMADOL HCL 50 MG PO TABS
50.0000 mg | ORAL_TABLET | Freq: Four times a day (QID) | ORAL | Status: DC
  Administered 2022-05-23 – 2022-05-25 (×8): 50 mg via ORAL
  Filled 2022-05-23 (×8): qty 1

## 2022-05-23 MED ORDER — CHLORHEXIDINE GLUCONATE 0.12 % MT SOLN
OROMUCOSAL | Status: AC
  Administered 2022-05-23: 15 mL via OROMUCOSAL
  Filled 2022-05-23: qty 15

## 2022-05-23 MED ORDER — VANCOMYCIN HCL IN DEXTROSE 1-5 GM/200ML-% IV SOLN
1000.0000 mg | Freq: Once | INTRAVENOUS | Status: AC
  Administered 2022-05-23: 1000 mg via INTRAVENOUS
  Filled 2022-05-23: qty 200

## 2022-05-23 MED ORDER — ORAL CARE MOUTH RINSE: 15.0000 mL | Freq: Once | OROMUCOSAL | Status: DC

## 2022-05-23 MED ORDER — ACETAMINOPHEN 500 MG PO TABS
1000.0000 mg | ORAL_TABLET | Freq: Once | ORAL | Status: AC
  Administered 2022-05-23: 1000 mg via ORAL
  Filled 2022-05-23: qty 2

## 2022-05-23 MED ORDER — PHENYLEPHRINE 80 MCG/ML (10ML) SYRINGE FOR IV PUSH (FOR BLOOD PRESSURE SUPPORT)
PREFILLED_SYRINGE | INTRAVENOUS | Status: DC | PRN
Start: 2022-05-23 — End: 2022-05-23
  Administered 2022-05-23: 80 ug via INTRAVENOUS

## 2022-05-23 MED ORDER — OXYCODONE HCL 5 MG PO TABS
5.0000 mg | ORAL_TABLET | ORAL | Status: DC | PRN
  Filled 2022-05-23: qty 2

## 2022-05-23 MED ORDER — DEXAMETHASONE SODIUM PHOSPHATE 10 MG/ML IJ SOLN
INTRAMUSCULAR | Status: AC
  Filled 2022-05-23: qty 1

## 2022-05-23 MED ORDER — METFORMIN HCL 500 MG PO TABS
1000.0000 mg | ORAL_TABLET | Freq: Two times a day (BID) | ORAL | Status: DC
  Administered 2022-05-24 – 2022-05-25 (×3): 1000 mg via ORAL
  Filled 2022-05-23 (×3): qty 2

## 2022-05-23 MED ORDER — VANCOMYCIN HCL 1000 MG IV SOLR: 1000.0000 mg | Freq: Two times a day (BID) | INTRAVENOUS | Status: DC

## 2022-05-23 MED ORDER — FENTANYL CITRATE (PF) 250 MCG/5ML IJ SOLN
INTRAMUSCULAR | Status: AC
  Filled 2022-05-23: qty 5

## 2022-05-23 MED ORDER — ONDANSETRON HCL 4 MG/2ML IJ SOLN
INTRAMUSCULAR | Status: AC
  Filled 2022-05-23: qty 2

## 2022-05-23 MED ORDER — SODIUM CHLORIDE 0.9 % IR SOLN
Status: DC | PRN
  Administered 2022-05-23 (×2): 3000 mL

## 2022-05-23 MED ORDER — THIAMINE HCL 100 MG PO TABS
100.0000 mg | ORAL_TABLET | Freq: Every day | ORAL | Status: DC
  Administered 2022-05-23 – 2022-05-25 (×3): 100 mg via ORAL
  Filled 2022-05-23 (×3): qty 1

## 2022-05-23 MED ORDER — FENOFIBRATE 160 MG PO TABS
160.0000 mg | ORAL_TABLET | Freq: Every day | ORAL | Status: DC
  Filled 2022-05-23 (×2): qty 1

## 2022-05-23 MED ORDER — KETOROLAC TROMETHAMINE 30 MG/ML IJ SOLN
INTRAMUSCULAR | Status: AC
  Administered 2022-05-23: 30 mg via INTRAVENOUS
  Filled 2022-05-23: qty 1

## 2022-05-23 MED ORDER — BENZTROPINE MESYLATE 0.5 MG PO TABS
0.5000 mg | ORAL_TABLET | Freq: Two times a day (BID) | ORAL | Status: DC
  Filled 2022-05-23 (×5): qty 1

## 2022-05-23 MED ORDER — INSULIN ASPART 100 UNIT/ML IJ SOLN
6.0000 [IU] | Freq: Once | INTRAMUSCULAR | Status: AC
  Administered 2022-05-23: 6 [IU] via SUBCUTANEOUS

## 2022-05-23 MED ORDER — FLECAINIDE ACETATE 50 MG PO TABS
50.0000 mg | ORAL_TABLET | Freq: Two times a day (BID) | ORAL | Status: DC
  Administered 2022-05-23 – 2022-05-25 (×4): 50 mg via ORAL
  Filled 2022-05-23 (×5): qty 1

## 2022-05-23 MED ORDER — SODIUM CHLORIDE 0.9% FLUSH: 10.0000 mL | Freq: Two times a day (BID) | INTRAVENOUS | Status: DC

## 2022-05-23 MED ORDER — DOCUSATE SODIUM 100 MG PO CAPS
100.0000 mg | ORAL_CAPSULE | Freq: Two times a day (BID) | ORAL | Status: DC
  Administered 2022-05-23 – 2022-05-25 (×4): 100 mg via ORAL
  Filled 2022-05-23 (×5): qty 1

## 2022-05-23 MED ORDER — VANCOMYCIN HCL 1000 MG IV SOLR
INTRAVENOUS | Status: DC | PRN
  Administered 2022-05-23: 1000 mg via TOPICAL

## 2022-05-23 MED ORDER — BISACODYL 10 MG RE SUPP: 10.0000 mg | Freq: Every day | RECTAL | Status: DC | PRN

## 2022-05-23 MED ORDER — ONDANSETRON HCL 4 MG/2ML IJ SOLN
4.0000 mg | Freq: Four times a day (QID) | INTRAMUSCULAR | Status: DC | PRN
  Administered 2022-05-25: 4 mg via INTRAVENOUS
  Filled 2022-05-23: qty 2

## 2022-05-23 MED ORDER — OXYCODONE HCL 5 MG PO TABS
10.0000 mg | ORAL_TABLET | ORAL | Status: DC | PRN
  Administered 2022-05-23 – 2022-05-25 (×6): 15 mg via ORAL
  Filled 2022-05-23 (×5): qty 3

## 2022-05-23 MED ORDER — DIPHENHYDRAMINE HCL 12.5 MG/5ML PO ELIX: 12.5000 mg | ORAL_SOLUTION | ORAL | Status: DC | PRN

## 2022-05-23 MED ORDER — MIDAZOLAM HCL 2 MG/2ML IJ SOLN
INTRAMUSCULAR | Status: DC | PRN
Start: 2022-05-23 — End: 2022-05-23
  Administered 2022-05-23 (×2): 2 mg via INTRAVENOUS

## 2022-05-23 MED ORDER — GABAPENTIN 300 MG PO CAPS
300.0000 mg | ORAL_CAPSULE | Freq: Every day | ORAL | Status: DC
  Administered 2022-05-23 – 2022-05-24 (×2): 300 mg via ORAL
  Filled 2022-05-23 (×2): qty 1

## 2022-05-23 MED ORDER — PROPOFOL 10 MG/ML IV BOLUS
INTRAVENOUS | Status: DC | PRN
  Administered 2022-05-23: 170 mg via INTRAVENOUS

## 2022-05-23 MED ORDER — AMPHETAMINE-DEXTROAMPHETAMINE 10 MG PO TABS
30.0000 mg | ORAL_TABLET | ORAL | Status: DC
  Administered 2022-05-24 – 2022-05-25 (×4): 30 mg via ORAL
  Filled 2022-05-23 (×4): qty 3

## 2022-05-23 MED ORDER — CHLORHEXIDINE GLUCONATE CLOTH 2 % EX PADS
6.0000 | MEDICATED_PAD | Freq: Every day | CUTANEOUS | Status: DC
  Administered 2022-05-23 – 2022-05-25 (×3): 6 via TOPICAL

## 2022-05-23 SURGICAL SUPPLY — 46 items
BAG COUNTER SPONGE SURGICOUNT (BAG) ×3 IMPLANT
BAG SPNG CNTER NS LX DISP (BAG) ×1
BNDG COHESIVE 4X5 TAN STRL (GAUZE/BANDAGES/DRESSINGS) ×2 IMPLANT
BNDG ELASTIC 4X5.8 VLCR STR LF (GAUZE/BANDAGES/DRESSINGS) ×3 IMPLANT
BNDG ELASTIC 6X5.8 VLCR STR LF (GAUZE/BANDAGES/DRESSINGS) ×2 IMPLANT
BNDG GAUZE ELAST 4 BULKY (GAUZE/BANDAGES/DRESSINGS) ×2 IMPLANT
BOOTCOVER CLEANROOM LRG (PROTECTIVE WEAR) ×6 IMPLANT
COVER SURGICAL LIGHT HANDLE (MISCELLANEOUS) ×3 IMPLANT
CUFF TOURN SGL QUICK 34 (TOURNIQUET CUFF)
CUFF TRNQT CYL 34X4.125X (TOURNIQUET CUFF) IMPLANT
DRSG ADAPTIC 3X8 NADH LF (GAUZE/BANDAGES/DRESSINGS) ×1 IMPLANT
DURAPREP 26ML APPLICATOR (WOUND CARE) ×3 IMPLANT
ELECT REM PT RETURN 9FT ADLT (ELECTROSURGICAL)
ELECTRODE REM PT RTRN 9FT ADLT (ELECTROSURGICAL) IMPLANT
EVACUATOR 1/8 PVC DRAIN (DRAIN) IMPLANT
GAUZE PAD ABD 8X10 STRL (GAUZE/BANDAGES/DRESSINGS) ×2 IMPLANT
GAUZE SPONGE 4X4 12PLY STRL (GAUZE/BANDAGES/DRESSINGS) ×2 IMPLANT
GAUZE XEROFORM 1X8 LF (GAUZE/BANDAGES/DRESSINGS) ×2 IMPLANT
GLOVE BIO SURGEON STRL SZ7 (GLOVE) ×3 IMPLANT
GLOVE BIOGEL PI IND STRL 7.0 (GLOVE) ×2 IMPLANT
GLOVE BIOGEL PI INDICATOR 7.0 (GLOVE) ×1
GLOVE ORTHO TXT STRL SZ7.5 (GLOVE) ×3 IMPLANT
GOWN STRL REUS W/ TWL LRG LVL3 (GOWN DISPOSABLE) ×4 IMPLANT
GOWN STRL REUS W/TWL LRG LVL3 (GOWN DISPOSABLE) ×4
HANDPIECE INTERPULSE COAX TIP (DISPOSABLE)
KIT BASIN OR (CUSTOM PROCEDURE TRAY) ×3 IMPLANT
KIT TURNOVER KIT B (KITS) ×3 IMPLANT
MANIFOLD NEPTUNE II (INSTRUMENTS) ×3 IMPLANT
NS IRRIG 1000ML POUR BTL (IV SOLUTION) ×3 IMPLANT
PACK ORTHO EXTREMITY (CUSTOM PROCEDURE TRAY) ×3 IMPLANT
PAD ARMBOARD 7.5X6 YLW CONV (MISCELLANEOUS) ×6 IMPLANT
PAD CAST 4YDX4 CTTN HI CHSV (CAST SUPPLIES) IMPLANT
PADDING CAST COTTON 4X4 STRL (CAST SUPPLIES) ×2
SET HNDPC FAN SPRY TIP SCT (DISPOSABLE) IMPLANT
SET IRRIG Y TYPE TUR BLADDER L (SET/KITS/TRAYS/PACK) ×1 IMPLANT
SPONGE T-LAP 18X18 ~~LOC~~+RFID (SPONGE) ×4 IMPLANT
STOCKINETTE IMPERVIOUS 9X36 MD (GAUZE/BANDAGES/DRESSINGS) ×2 IMPLANT
SUT ETHILON 2 0 PSLX (SUTURE) ×2 IMPLANT
SUT ETHILON 3 0 PS 1 (SUTURE) IMPLANT
SWAB CULTURE ESWAB REG 1ML (MISCELLANEOUS) IMPLANT
TOWEL GREEN STERILE (TOWEL DISPOSABLE) ×3 IMPLANT
TOWEL GREEN STERILE FF (TOWEL DISPOSABLE) ×3 IMPLANT
TUBE CONNECTING 12X1/4 (SUCTIONS) ×3 IMPLANT
UNDERPAD 30X36 HEAVY ABSORB (UNDERPADS AND DIAPERS) ×3 IMPLANT
WATER STERILE IRR 1000ML POUR (IV SOLUTION) ×2 IMPLANT
YANKAUER SUCT BULB TIP NO VENT (SUCTIONS) ×3 IMPLANT

## 2022-05-23 NOTE — Plan of Care (Signed)
  Problem: Nutrition: Goal: Adequate nutrition will be maintained 05/23/2022 1748 by Hurshel Keys A, RN Outcome: Progressing 05/23/2022 1745 by Hurshel Keys A, RN Outcome: Progressing   Problem: Pain Managment: Goal: General experience of comfort will improve 05/23/2022 1748 by Bernette Mayers, RN Outcome: Progressing 05/23/2022 1745 by Hurshel Keys A, RN Outcome: Progressing   Problem: Safety: Goal: Ability to remain free from injury will improve 05/23/2022 1748 by Mohamed-Medani, Thayer Embleton A, RN Outcome: Progressing 05/23/2022 1745 by Bernette Mayers, RN Outcome: Progressing

## 2022-05-23 NOTE — Anesthesia Postprocedure Evaluation (Signed)
Anesthesia Post Note  Patient: MIRELA PARSLEY  Procedure(s) Performed: INCISION AND DRAINAGE ABSCESS, RIGHT ANKLE (Right: Ankle) HARDWARE REMOVAL (Right: Ankle)     Patient location during evaluation: PACU Anesthesia Type: General Level of consciousness: awake and alert Pain management: pain level controlled Vital Signs Assessment: post-procedure vital signs reviewed and stable Respiratory status: spontaneous breathing, nonlabored ventilation, respiratory function stable and patient connected to nasal cannula oxygen Cardiovascular status: blood pressure returned to baseline and stable Postop Assessment: no apparent nausea or vomiting Anesthetic complications: no   No notable events documented.  Last Vitals:  Vitals:   05/23/22 1400 05/23/22 1431  BP: 117/78 125/89  Pulse: 65 69  Resp: 13 16  Temp: 36.6 C 36.5 C  SpO2: 94% 95%    Last Pain:  Vitals:   05/23/22 1431  TempSrc: Oral  PainSc:                  Collene Schlichter

## 2022-05-23 NOTE — Progress Notes (Deleted)
Pharmacy Antibiotic Note  Brittney Cooper is a 61 y.o. female admitted on 05/23/2022.  Pharmacy has been consulted for vancomycin dosing for right ankle abscess s/p hardware removal with osteomyelitis.  Tmax 99.1, wbc normal at 9.7. Renal function normal with scr of 0.65.   Vancomycin 1500mg  x1 then 1g IV Q 12 hrs. Goal AUC 400-550. Expected AUC: 507 SCr used: 0.65   Height: 5\' 4"  (162.6 cm) Weight: 76.2 kg (167 lb 15.9 oz) IBW/kg (Calculated) : 54.7  Temp (24hrs), Avg:98 F (36.7 C), Min:97.4 F (36.3 C), Max:99.1 F (37.3 C)  Recent Labs  Lab 05/23/22 0730  WBC 9.7  CREATININE 0.65    Estimated Creatinine Clearance: 76.6 mL/min (by C-G formula based on SCr of 0.65 mg/dL).    Allergies  Allergen Reactions   Codeine Nausea And Vomiting   Demerol [Meperidine] Other (See Comments)    Hallucinations    Jardiance [Empagliflozin] Other (See Comments)    Yeast infections   Lopressor [Metoprolol] Other (See Comments)    nightmares    Thank you for allowing pharmacy to be a part of this patient's care.  PharmD., BCPS Clinical Pharmacist 05/23/2022 3:43 PM

## 2022-05-23 NOTE — Transfer of Care (Signed)
Immediate Anesthesia Transfer of Care Note  Patient: Brittney Cooper  Procedure(s) Performed: INCISION AND DRAINAGE ABSCESS, RIGHT ANKLE (Right: Ankle) HARDWARE REMOVAL (Right: Ankle)  Patient Location: PACU  Anesthesia Type:General  Level of Consciousness: drowsy  Airway & Oxygen Therapy: Patient Spontanous Breathing and Patient connected to face mask oxygen  Post-op Assessment: Report given to RN and Post -op Vital signs reviewed and stable  Post vital signs: Reviewed and stable  Last Vitals:  Vitals Value Taken Time  BP 117/79 05/23/22 0857  Temp    Pulse 74 05/23/22 0858  Resp 13 05/23/22 0858  SpO2 95 % 05/23/22 0858  Vitals shown include unvalidated device data.  Last Pain:  Vitals:   05/23/22 0700  TempSrc: Oral  PainSc: 7       Patients Stated Pain Goal: 3 (05/23/22 0700)  Complications: No notable events documented.

## 2022-05-23 NOTE — Anesthesia Procedure Notes (Signed)
Procedure Name: LMA Insertion Date/Time: 05/23/2022 7:43 AM Performed by: Tressia Miners, CRNA Pre-anesthesia Checklist: Patient identified, Emergency Drugs available, Suction available and Patient being monitored Patient Re-evaluated:Patient Re-evaluated prior to induction Oxygen Delivery Method: Circle System Utilized Preoxygenation: Pre-oxygenation with 100% oxygen Induction Type: IV induction Ventilation: Mask ventilation without difficulty LMA: LMA inserted LMA Size: 4.0 Number of attempts: 1 Airway Equipment and Method: Bite block Placement Confirmation: positive ETCO2 Tube secured with: Tape Dental Injury: Teeth and Oropharynx as per pre-operative assessment

## 2022-05-23 NOTE — Consult Note (Signed)
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 and I&D of abscess as well as bone History of complete heart block with pacemaker placement Hx of continued syncopal events that Brittney Cooper tells me are Neuro-cardogenic Recent dog scratch Recent fall with multiple scratches 1 of which Brittney Cooper took Augmentin for 3 days Diabetes mellitus NASH Smoker  Plan:  Fine to continue vancomycin for now we will add ceftriaxone Check sed rate CRP Agree with PICC line placement Follow-up culture data Brittney Cooper will need a least 6 weeks of parenteral antibiotics  Principal Problem:   Infected hardware in right lower extremity (HCC)   Scheduled Meds:  acetaminophen  1,000 mg Oral Q6H   amphetamine-dextroamphetamine  30 mg Oral BID   aspirin EC  81 mg Oral BID   benztropine  0.5-1 mg Oral BID   divalproex  250 mg Oral QPM   docusate sodium  100 mg Oral BID   doxazosin  1 mg Oral QHS   DULoxetine  60 mg Oral q AM   fenofibrate  160 mg Oral Daily   flecainide  50 mg Oral BID   folic acid  1 mg Oral q AM   gabapentin  300 mg Oral QHS   glipiZIDE  5 mg Oral Daily   magnesium gluconate  500 mg Oral Daily   metFORMIN  1,000 mg Oral BID   oxyCODONE       pantoprazole  40 mg Oral Daily   QUEtiapine  200 mg Oral QHS   rosuvastatin  20 mg Oral Daily   thiamine  100 mg Oral Daily   traMADol  50 mg Oral Q6H   Continuous Infusions:  ceFAZolin     methocarbamol (ROBAXIN) IV     vancomycin     PRN Meds:.[START ON 05/24/2022] acetaminophen, ALPRAZolam, bisacodyl, bisoprolol, diphenhydrAMINE, fentaNYL (SUBLIMAZE) injection, HYDROmorphone (DILAUDID) injection, methocarbamol **OR** methocarbamol (ROBAXIN) IV, metoCLOPramide  **OR** metoCLOPramide (REGLAN) injection, ondansetron (ZOFRAN) IV, ondansetron **OR** ondansetron (ZOFRAN) IV, oxyCODONE, oxyCODONE, polyethylene glycol  HPI: Brittney Cooper is a 61 y.o. female with poor control diabetes mellitus hyperlipidemia NASH smoking, complete heart block status post pacemaker placement recurrent syncope which Brittney Cooper says is due to neurocardiogenic syncope who had ankle fraction that was treated with open reduction internal fixation roughly 5 years ago by Dr. Eulah PontMurphy.  Brittney Cooper has had several falls recently and had some cuts and scratches for which Brittney Cooper took some Augmentin that Brittney Cooper had leftover when Brittney Cooper was prescribed it after a dog bite.  Brittney Cooper developed sudden swelling and pain and purulence from her right ankle and presented to orthopedic surgery office  Other than the Augmentin Brittney Cooper says Brittney Cooper was not on antibiotics prior to surgery.  Brittney Cooper is taken the operating room where sinus tract and abscess were excised and debrided.  Plate was exposed and screws and plate were removed.  The bone was believed to be involved and was also curetted at the screw sites and cultures have been sent.  Brittney Cooper has been placed on vancomycin.  I will add ceftriaxone to cover streptococcal species in a more bacteriocidal way.  We will follow along closely.  I spent 83 minutes with the patient including than 50% of the  time in face to face counseling of the patient  Hardware associated osteomyelitis and abscess personally reviewing films along with review of medical records in preparation for the visit and during the visit and in coordination of her care.   Review of Systems: Review of Systems  Constitutional:  Negative for chills, fever, malaise/fatigue and weight loss.  HENT:  Negative for congestion and sore throat.   Eyes:  Negative for blurred vision and photophobia.  Respiratory:  Negative for cough, shortness of breath and wheezing.   Cardiovascular:  Negative for chest pain, palpitations and leg  swelling.  Gastrointestinal:  Negative for abdominal pain, blood in stool, constipation, diarrhea, heartburn, melena, nausea and vomiting.  Genitourinary:  Negative for dysuria, flank pain and hematuria.  Musculoskeletal:  Positive for myalgias. Negative for back pain, falls and joint pain.  Skin:  Negative for itching and rash.  Neurological:  Positive for dizziness, loss of consciousness and weakness. Negative for focal weakness and headaches.  Endo/Heme/Allergies:  Does not bruise/bleed easily.  Psychiatric/Behavioral:  Negative for depression and suicidal ideas. The patient does not have insomnia.    Past Medical History:  Diagnosis Date   ADHD (attention deficit hyperactivity disorder)    AICD (automatic cardioverter/defibrillator) present    Biotroniks Pacemaker   Alcohol abuse    Alcoholic pancreatitis 10/16, 11/16, 7/17   Allergy    Anxiety    Arthritis    Bipolar disorder (HCC)    Cardiac arrest (HCC) 06-2015, 08-2016   x3- pt has pacemaker    Chronic pain    back   Complication of anesthesia    woke up during 2 surgical procedures   Depression    Diabetes mellitus without complication (HCC)    Type 2   Fatty liver    Fracture of lateral malleolus of right fibula    GERD (gastroesophageal reflux disease)    Hepatomegaly    Hyperlipidemia    Hypertension    Neuromuscular disorder (HCC)    Dystonia   OCD (obsessive compulsive disorder)    Pancreatic pseudocyst    Panic attack    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, 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   Syncope    neurocardiogenic   Third degree heart block (HCC)     Social History   Tobacco Use   Smoking status: Every Day    Packs/day: 1.00    Years: 15.00    Pack years: 15.00    Types: Cigarettes   Smokeless tobacco: Never   Tobacco comments:    1/2 -1 ppd  Vaping Use   Vaping Use: Former   Substances: Nicotine  Substance Use Topics   Alcohol use: Yes    Comment:  social   Drug use: No    Family History  Problem Relation Age of Onset   Stroke Mother    Kidney disease Mother    Colon polyps Mother    Hypertension Mother    Kidney failure Mother    Lung cancer Father        stage IV    Colon polyps Father    Hypertension Father    Stomach cancer Maternal Grandfather    Colon cancer Paternal Grandfather    Hypertension Sister    Hyperlipidemia Sister    Hypertension Brother    Hyperlipidemia Brother    Rectal cancer Neg Hx    Esophageal cancer Neg Hx    Pancreatic cancer Neg Hx  Allergies  Allergen Reactions   Codeine Nausea And Vomiting   Demerol [Meperidine] Other (See Comments)    Hallucinations    Jardiance [Empagliflozin] Other (See Comments)    Yeast infections   Lopressor [Metoprolol] Other (See Comments)    nightmares    OBJECTIVE: Blood pressure (!) 132/100, pulse 89, temperature (!) 97.4 F (36.3 C), resp. rate 16, height 5\' 4"  (1.626 m), weight 76.2 kg, SpO2 90 %.  Physical Exam Constitutional:      General: Brittney Cooper is not in acute distress.    Appearance: Normal appearance. Brittney Cooper is well-developed. Brittney Cooper is not ill-appearing or diaphoretic.  HENT:     Head: Normocephalic and atraumatic.     Right Ear: Hearing and external ear normal.     Left Ear: Hearing and external ear normal.     Nose: No nasal deformity or rhinorrhea.  Eyes:     General: No scleral icterus.    Conjunctiva/sclera: Conjunctivae normal.     Right eye: Right conjunctiva is not injected.     Left eye: Left conjunctiva is not injected.     Pupils: Pupils are equal, round, and reactive to light.  Neck:     Vascular: No JVD.  Cardiovascular:     Rate and Rhythm: Normal rate and regular rhythm.     Heart sounds: S1 normal and S2 normal.  Pulmonary:     Effort: Pulmonary effort is normal. No respiratory distress.     Breath sounds: No wheezing.  Abdominal:     General: There is no distension.  Musculoskeletal:     Right shoulder: Normal.      Left shoulder: Normal.     Cervical back: Normal range of motion and neck supple.     Right hip: Normal.     Left hip: Normal.     Right knee: Normal.     Left knee: Normal.  Lymphadenopathy:     Head:     Right side of head: No submandibular, preauricular or posterior auricular adenopathy.     Left side of head: No submandibular, preauricular or posterior auricular adenopathy.     Cervical: No cervical adenopathy.     Right cervical: No superficial or deep cervical adenopathy.    Left cervical: No superficial or deep cervical adenopathy.  Skin:    General: Skin is warm and dry.     Coloration: Skin is not pale.     Findings: No abrasion, bruising, ecchymosis, erythema, lesion or rash.     Nails: There is no clubbing.  Neurological:     General: No focal deficit present.     Mental Status: Brittney Cooper is alert and oriented to person, place, and time.     Sensory: No sensory deficit.     Coordination: Coordination normal.     Gait: Gait normal.  Psychiatric:        Attention and Perception: Brittney Cooper is attentive.        Mood and Affect: Mood normal.        Speech: Speech normal.        Behavior: Behavior normal. Behavior is cooperative.        Thought Content: Thought content normal.        Judgment: Judgment normal.   Ankle wrapped   Lab Results Lab Results  Component Value Date   WBC 9.7 05/23/2022   HGB 12.8 05/23/2022   HCT 35.9 (L) 05/23/2022   MCV 95.2 05/23/2022   PLT 228 05/23/2022    Lab Results  Component Value Date   CREATININE 0.65 05/23/2022   BUN 16 05/23/2022   NA 136 05/23/2022   K 3.4 (L) 05/23/2022   CL 100 05/23/2022   CO2 28 05/23/2022    Lab Results  Component Value Date   ALT 23 05/23/2022   AST 16 05/23/2022   ALKPHOS 91 05/23/2022   BILITOT 1.1 05/23/2022     Microbiology: No results found for this or any previous visit (from the past 240 hour(s)).  Acey Lav, MD [facility] [phone] pager  05/23/2022, 2:19 PM

## 2022-05-23 NOTE — Progress Notes (Signed)
Pharmacy Antibiotic Note  Brittney Cooper is a 61 y.o. female admitted on 05/23/2022 with R ankle abscess and concern for osteomyelitis. S/p debridement and removal of the distal right fibula reduction hardware on 5/28.  Pharmacy has been consulted for vancomycin dosing.  Plan: Vancomycin 1750mg  x 1 load  Vancomycin 1000mg  q12h (eAUC 507, Scr 0.65)  F/u renal function, cultures, and levels as needed  Height: 5\' 4"  (162.6 cm) Weight: 76.2 kg (167 lb 15.9 oz) IBW/kg (Calculated) : 54.7  Temp (24hrs), Avg:98 F (36.7 C), Min:97.4 F (36.3 C), Max:99.1 F (37.3 C)  Recent Labs  Lab 05/23/22 0730  WBC 9.7  CREATININE 0.65    Estimated Creatinine Clearance: 76.6 mL/min (by C-G formula based on SCr of 0.65 mg/dL).    Allergies  Allergen Reactions   Codeine Nausea And Vomiting   Demerol [Meperidine] Other (See Comments)    Hallucinations    Jardiance [Empagliflozin] Other (See Comments)    Yeast infections   Lopressor [Metoprolol] Other (See Comments)    nightmares    Antimicrobials this admission: Vancomycin 5/28 >   Dose adjustments this admission:  Microbiology results: 5/28 surgical culture: pending  Thank you for allowing pharmacy to be a part of this patient's care.  Levonne Spiller 05/23/2022 3:42 PM

## 2022-05-23 NOTE — Progress Notes (Signed)
Peripherally Inserted Central Catheter Placement  The IV Nurse has discussed with the patient and/or persons authorized to consent for the patient, the purpose of this procedure and the potential benefits and risks involved with this procedure.  The benefits include less needle sticks, lab draws from the catheter, and the patient may be discharged home with the catheter. Risks include, but not limited to, infection, bleeding, blood clot (thrombus formation), and puncture of an artery; nerve damage and irregular heartbeat and possibility to perform a PICC exchange if needed/ordered by physician.  Alternatives to this procedure were also discussed.  Bard Power PICC patient education guide, fact sheet on infection prevention and patient information card has been provided to patient /or left at bedside. Pt appears alert and oriented. Due to patient receiving anesthesia this AM, sister at bedside and signed consent.   PICC Placement Documentation  PICC Single Lumen [id number] Right Basilic 34 cm 0 cm (Active)  Indication for Insertion or Continuance of Line Prolonged intravenous therapies;Home intravenous therapies (PICC only) 05/23/22 1648  Exposed Catheter (cm) 0 cm 05/23/22 1648  Site Assessment Clean, Dry, Intact 05/23/22 1648  Line Status Flushed;Saline locked;Blood return noted 05/23/22 1648  Dressing Type Securing device;Transparent 05/23/22 1648  Dressing Status Antimicrobial disc in place 05/23/22 [place] Not Applicable [id number]  Line Care Connections checked and tightened 05/23/22 1648  Line Adjustment (NICU/IV Team Only) No 05/23/22 1648  Dressing Intervention New dressing 05/23/22 1648  Dressing Change Due 05/30/22 05/23/22 [place] 05/23/2022, 4:50 PM

## 2022-05-23 NOTE — Op Note (Signed)
05/23/2022  8:27 AM  PATIENT:  Brittney Cooper    PRE-OPERATIVE DIAGNOSIS:  right ankle abscess overlying hardware  POST-OPERATIVE DIAGNOSIS: Right ankle abscess overlying hardware with a small element of distal fibular osteomyelitis  PROCEDURE: Excisional debridement, skin, subcutaneous tissue, bone, with debridement of fibular osteomyelitis,, right ankle, with removal of hardware  SURGEON:  [name], MD  PHYSICIAN ASSISTANT: Aggie Moats, PA-C, present and scrubbed throughout the case, critical for completion in a timely fashion, and for retraction, instrumentation, and closure.  ANESTHESIA:   General  PREOPERATIVE INDICATIONS:  [name] is a  61 y.o. female who has relatively poorly controlled diabetes, and had an ankle fracture open reduction internal fixation treated 5 or 6 years ago with Dr. Edmonia Lynch.  She presented to the office on Friday with a draining painful lateral wound overlying the fibula.  The risks benefits and alternatives were discussed with the patient preoperatively including but not limited to the risks of infection, bleeding, nerve injury, cardiopulmonary complications, the need for revision surgery, among others, and the patient was willing to proceed.  ESTIMATED BLOOD LOSS: 50 mL  OPERATIVE IMPLANTS: Lag screw with multiple screws in the plate removed  OPERATIVE FINDINGS: There was some slight softening of the distal fibula at the very tip.  There was purulence and abscess tracking anteriorly over the distal fibula.  OPERATIVE PROCEDURE: The patient was brought to the operating room placed in supine position.  General anesthesia was administered.  IV antibiotics were held until after intraoperative cultures were taken.  The right lower extremity was prepped and draped in usual sterile fashion.  Timeout was performed.  Incision was made through her previous incision and I ellipsed out a very small area of sinus tract.  Excisional debridement was  carried out including the skin, subcutaneous tissue, and the tissue overlying the plate.  The plate was exposed and all screws were removed and the plate removed without difficulty.  The lag screw was a little bit counterintuitive, turns out it was placed from posterior to anterior, and once I made that recognition I was able to remove the screw without difficulty.  I used a curette to debride the screw holes, as well as diffusely in the soft tissue, and then irrigated 6 L of normal saline, placed vancomycin powder, IV vancomycin was also administered after surgical cultures were taken.  The skin was closed with nylon and a soft dressing applied.  She was awakened and returned to the PACU in stable and satisfactory condition.  We will plan for a PICC line, and await surgical cultures, probably 6 weeks of IV antibiotics, but we will involve the infectious disease service for assistance for antibiotic guidance.  Debridement type: Excisional Debridement  Side: right  Body Location: Distal fibula/ankle  Tools used for debridement: scalpel, curette, and rongeur  Pre-debridement Wound size (cm):   Length: 0.5        Width: 0.5     Depth: 1   Post-debridement Wound size (cm):   Length: 0        Width: 0     Depth: 0   Debridement depth beyond dead/damaged tissue down to healthy viable tissue: no  Tissue layer involved: skin, subcutaneous tissue, muscle / fascia, bone  Nature of tissue removed: Necrotic, Devitalized Tissue, Non-viable tissue, and Purulence  Irrigation volume: 6L     Irrigation fluid type: Normal Saline

## 2022-05-23 NOTE — Interval H&P Note (Signed)
History and Physical Interval Note:  05/23/2022 7:24 AM  Brittney Cooper  has presented today for surgery, with the diagnosis of INFECTED RIGHT ANKLE.  The various methods of treatment have been discussed with the patient and family. After consideration of risks, benefits and other options for treatment, the patient has consented to  Procedure(s): INCISION AND DRAINAGE ABSCESS, RIGHT ANKLE (Right) as a surgical intervention.  The patient's history has been reviewed, patient examined, no change in status, stable for surgery.  I have reviewed the patient's chart and labs.  Questions were answered to the patient's satisfaction.     [name]

## 2022-05-23 NOTE — Plan of Care (Signed)

## 2022-05-23 NOTE — Discharge Instructions (Signed)

## 2022-05-24 ENCOUNTER — Observation Stay (HOSPITAL_COMMUNITY): Payer: Medicare Other

## 2022-05-24 ENCOUNTER — Encounter (HOSPITAL_COMMUNITY): Payer: Self-pay | Admitting: Orthopedic Surgery

## 2022-05-24 DIAGNOSIS — L02419 Cutaneous abscess of limb, unspecified: Secondary | ICD-10-CM | POA: Diagnosis not present

## 2022-05-24 DIAGNOSIS — I739 Peripheral vascular disease, unspecified: Secondary | ICD-10-CM

## 2022-05-24 DIAGNOSIS — A4901 Methicillin susceptible Staphylococcus aureus infection, unspecified site: Secondary | ICD-10-CM

## 2022-05-24 DIAGNOSIS — T847XXD Infection and inflammatory reaction due to other internal orthopedic prosthetic devices, implants and grafts, subsequent encounter: Secondary | ICD-10-CM | POA: Diagnosis not present

## 2022-05-24 DIAGNOSIS — M86262 Subacute osteomyelitis, left tibia and fibula: Secondary | ICD-10-CM | POA: Diagnosis not present

## 2022-05-24 LAB — GLUCOSE, CAPILLARY
Glucose-Capillary: 253 mg/dL — ABNORMAL HIGH (ref 70–99)
Glucose-Capillary: 95 mg/dL (ref 70–99)

## 2022-05-24 LAB — COMPREHENSIVE METABOLIC PANEL
ALT: 21 U/L (ref 0–44)
AST: 13 U/L — ABNORMAL LOW (ref 15–41)
Albumin: 3 g/dL — ABNORMAL LOW (ref 3.5–5.0)
Alkaline Phosphatase: 95 U/L (ref 38–126)
Anion gap: 6 (ref 5–15)
BUN: 16 mg/dL (ref 6–20)
CO2: 28 mmol/L (ref 22–32)
Calcium: 8.9 mg/dL (ref 8.9–10.3)
Chloride: 101 mmol/L (ref 98–111)
Creatinine, Ser: 0.74 mg/dL (ref 0.44–1.00)
GFR, Estimated: >60 mL/min (ref >60)
Glucose, Bld: 375 mg/dL — ABNORMAL HIGH (ref 70–99)
Potassium: 3.8 mmol/L (ref 3.5–5.1)
Sodium: 135 mmol/L (ref 135–145)
Total Bilirubin: 0.4 mg/dL (ref 0.3–1.2)
Total Protein: 5.6 g/dL — ABNORMAL LOW (ref 6.5–8.1)

## 2022-05-24 LAB — HEMOGLOBIN A1C
Hgb A1c MFr Bld: 8.3 % — ABNORMAL HIGH (ref 4.8–5.6)
Mean Plasma Glucose: 191.51 mg/dL

## 2022-05-24 LAB — CBC
HCT: 31.1 % — ABNORMAL LOW (ref 36.0–46.0)
Hemoglobin: 10.6 g/dL — ABNORMAL LOW (ref 12.0–15.0)
MCH: 33.2 pg (ref 26.0–34.0)
MCHC: 34.1 g/dL (ref 30.0–36.0)
MCV: 97.5 fL (ref 80.0–100.0)
Platelets: 217 10*3/uL (ref 150–400)
RBC: 3.19 MIL/uL — ABNORMAL LOW (ref 3.87–5.11)
RDW: 13.3 % (ref 11.5–15.5)
WBC: 7.5 10*3/uL (ref 4.0–10.5)
nRBC: 0 % (ref 0.0–0.2)

## 2022-05-24 LAB — SEDIMENTATION RATE: Sed Rate: 60 mm/hr — ABNORMAL HIGH (ref 0–22)

## 2022-05-24 LAB — C-REACTIVE PROTEIN: CRP: 12.3 mg/dL — ABNORMAL HIGH (ref <1.0)

## 2022-05-24 LAB — HEPATITIS B SURFACE ANTIGEN: Hepatitis B Surface Ag: NONREACTIVE

## 2022-05-24 LAB — HEPATITIS B SURFACE ANTIBODY,QUALITATIVE: Hep B S Ab: REACTIVE — AB

## 2022-05-24 MED ORDER — INSULIN ASPART 100 UNIT/ML IJ SOLN
0.0000 [IU] | Freq: Three times a day (TID) | INTRAMUSCULAR | Status: DC
  Administered 2022-05-24: 8 [IU] via SUBCUTANEOUS
  Administered 2022-05-25 (×2): 3 [IU] via SUBCUTANEOUS

## 2022-05-24 MED ORDER — CEFAZOLIN SODIUM-DEXTROSE 2-4 GM/100ML-% IV SOLN
2.0000 g | Freq: Three times a day (TID) | INTRAVENOUS | Status: DC
  Administered 2022-05-24 – 2022-05-25 (×4): 2 g via INTRAVENOUS
  Filled 2022-05-24 (×5): qty 100

## 2022-05-24 MED ORDER — VANCOMYCIN HCL IN DEXTROSE 1-5 GM/200ML-% IV SOLN
1000.0000 mg | Freq: Two times a day (BID) | INTRAVENOUS | Status: DC
  Administered 2022-05-25: 1000 mg via INTRAVENOUS
  Filled 2022-05-24: qty 200

## 2022-05-24 NOTE — Progress Notes (Signed)
Subjective: No new complaints   Antibiotics:  Anti-infectives (From admission, onward)    Start     Dose/Rate Route Frequency Ordered Stop   05/24/22 0500  vancomycin (VANCOCIN) IVPB 1000 mg/200 mL premix        1,000 mg 200 mL/hr over 60 Minutes Intravenous Every 12 hours 05/23/22 1552     05/23/22 2000  vancomycin (VANCOCIN) IVPB 1000 mg/200 mL premix  Status:  Discontinued        1,000 mg 200 mL/hr over 60 Minutes Intravenous Every 12 hours 05/23/22 1125 05/23/22 1538   05/23/22 1630  vancomycin (VANCOREADY) IVPB 1750 mg/350 mL        1,750 mg 175 mL/hr over 120 Minutes Intravenous  Once 05/23/22 1544 05/23/22 2107   05/23/22 1600  cefTRIAXone (ROCEPHIN) 2 g in sodium chloride 0.9 % 100 mL IVPB        2 g 200 mL/hr over 30 Minutes Intravenous Every 24 hours 05/23/22 1433     05/23/22 1000  vancomycin (VANCOCIN) 1,000 mg in sodium chloride 0.9 % 250 mL IVPB  Status:  Discontinued        1,000 mg 250 mL/hr over 60 Minutes Intravenous Every 12 hours 05/23/22 0745 05/23/22 0746   05/23/22 0824  vancomycin (VANCOCIN) powder  Status:  Discontinued          As needed 05/23/22 0825 05/23/22 0849   05/23/22 0800  vancomycin (VANCOCIN) IVPB 1000 mg/200 mL premix        1,000 mg 200 mL/hr over 60 Minutes Intravenous  Once 05/23/22 0746 05/23/22 0911   05/23/22 0732  ceFAZolin (ANCEF) 2-4 GM/100ML-% IVPB       Note to Pharmacy: Marga Melnick C: cabinet override      05/23/22 0732 05/23/22 1944       Medications: Scheduled Meds:  amphetamine-dextroamphetamine  30 mg Oral 2 times per day   aspirin EC  81 mg Oral BID   benztropine  0.5-1 mg Oral BID   Chlorhexidine Gluconate Cloth  6 each Topical Daily   divalproex  250 mg Oral QPM   docusate sodium  100 mg Oral BID   doxazosin  1 mg Oral QHS   DULoxetine  60 mg Oral q AM   fenofibrate  160 mg Oral Daily   flecainide  50 mg Oral BID   folic acid  1 mg Oral q AM   gabapentin  300 mg Oral QHS   glipiZIDE  5 mg Oral Q  breakfast   insulin aspart  0-15 Units Subcutaneous TID WC   magnesium gluconate  500 mg Oral Daily   metFORMIN  1,000 mg Oral BID WC   pantoprazole  40 mg Oral Daily   QUEtiapine  200 mg Oral QHS   rosuvastatin  20 mg Oral Daily   sodium chloride flush  10-40 mL Intracatheter Q12H   thiamine  100 mg Oral Daily   traMADol  50 mg Oral Q6H   Continuous Infusions:  cefTRIAXone (ROCEPHIN)  IV 2 g (05/23/22 1814)   methocarbamol (ROBAXIN) IV     vancomycin 1,000 mg (05/24/22 0452)   PRN Meds:.acetaminophen, ALPRAZolam, bisacodyl, bisoprolol, diphenhydrAMINE, HYDROmorphone (DILAUDID) injection, methocarbamol **OR** methocarbamol (ROBAXIN) IV, metoCLOPramide **OR** metoCLOPramide (REGLAN) injection, ondansetron **OR** ondansetron (ZOFRAN) IV, oxyCODONE, oxyCODONE, polyethylene glycol, sodium chloride flush    Objective: Weight change:  No intake or output data in the 24 hours ending 05/24/22 1317 Blood pressure 119/81, pulse 73, temperature 98.1 F (36.7  C), temperature source Oral, resp. rate 18, height 5\' 4"  (1.626 m), weight 76.2 kg, SpO2 95 %. Temp:  [97.7 F (36.5 C)-98.6 F (37 C)] 98.1 F (36.7 C) (05/29 0743) Pulse Rate:  [63-99] 73 (05/29 0743) Resp:  [13-18] 18 (05/29 0743) BP: (104-130)/(63-101) 119/81 (05/29 0743) SpO2:  [94 %-96 %] 95 % (05/29 0743)  Physical Exam: Physical Exam Constitutional:      General: She is not in acute distress.    Appearance: She is well-developed. She is not diaphoretic.  HENT:     Head: Normocephalic and atraumatic.     Right Ear: External ear normal.     Left Ear: External ear normal.     Mouth/Throat:     Pharynx: No oropharyngeal exudate.  Eyes:     General: No scleral icterus.    Conjunctiva/sclera: Conjunctivae normal.     Pupils: Pupils are equal, round, and reactive to light.  Cardiovascular:     Rate and Rhythm: Normal rate and regular rhythm.  Pulmonary:     Effort: Pulmonary effort is normal. No respiratory distress.      Breath sounds: No wheezing.  Abdominal:     General: There is no distension.     Palpations: Abdomen is soft.     Tenderness: There is no rebound.  Musculoskeletal:        General: No tenderness.  Lymphadenopathy:     Cervical: No cervical adenopathy.  Skin:    General: Skin is warm and dry.     Coloration: Skin is not pale.     Findings: No erythema or rash.  Neurological:     General: No focal deficit present.     Mental Status: She is alert and oriented to person, place, and time.     Motor: No abnormal muscle tone.     Coordination: Coordination normal.  Psychiatric:        Mood and Affect: Mood normal.        Behavior: Behavior normal.        Thought Content: Thought content normal.        Judgment: Judgment normal.     Ankle wrapped  CBC:    BMET Recent Labs    05/23/22 0730 05/24/22 0205  NA 136 135  K 3.4* 3.8  CL 100 101  CO2 28 28  GLUCOSE 176* 375*  BUN 16 16  CREATININE 0.65 0.74  CALCIUM 9.2 8.9     Liver Panel  Recent Labs    05/23/22 0730 05/24/22 0205  PROT 6.5 5.6*  ALBUMIN 3.7 3.0*  AST 16 13*  ALT 23 21  ALKPHOS 91 95  BILITOT 1.1 0.4       Sedimentation Rate Recent Labs    05/24/22 0205  ESRSEDRATE 60*   C-Reactive Protein Recent Labs    05/24/22 0205  CRP 12.3*    Micro Results: Recent Results (from the past 720 hour(s))  Aerobic/Anaerobic Culture w Gram Stain (surgical/deep wound)     Status: None (Preliminary result)   Collection Time: 05/23/22  8:05 AM   Specimen: PATH Other; Tissue  Result Value Ref Range Status   Specimen Description TISSUE RIGHT ANKLE  Final   Special Requests DEEP RIGHT ANKLE  Final   Gram Stain   Final    FEW WBC PRESENT, PREDOMINANTLY PMN FEW GRAM POSITIVE COCCI IN PAIRS    Culture   Final    MODERATE STAPHYLOCOCCUS AUREUS SUSCEPTIBILITIES TO FOLLOW Performed at [facility], [place]  [address] [place], [place] [address]    Report Status PENDING  Incomplete     Studies/Results: DG Ankle Right Port  Result Date: 05/23/2022 CLINICAL DATA:  Postop from surgical hardware removal. EXAM: PORTABLE RIGHT ANKLE - 2 VIEW COMPARISON:  05/20/2022. FINDINGS: Distal fibular fixation plate and associated fixation screws have been removed. Fracture is healed. Ankle joint is normally aligned. Lateral soft tissue swelling. IMPRESSION: 1. Status post removal of the distal right fibula reduction hardware. Fracture is healed. Electronically Signed   By: Lajean Manes M.D.   On: 05/23/2022 10:17   [place] EKG SITE RITE  Result Date: 05/23/2022 If Site Rite image not attached, placement could not be confirmed due to current cardiac rhythm.  [place] EKG SITE RITE  Result Date: 05/23/2022 If [facility] image not attached, placement could not be confirmed due to current cardiac rhythm.  Inpatient: VAS [place] PAD ABI  Result Date: 05/24/2022  LOWER EXTREMITY DOPPLER STUDY Patient Name:  MAIRIN BROWER  Date of Exam:   05/24/2022 Medical Rec #: [id number]      Accession #:    [id number] Date of Birth: [birth year]       Patient Gender: F Patient Age:   3 years Exam Location:  [facility] Procedure:      VAS [place] ABI WITH/WO TBI Referring Phys: Merlene Pulling --------------------------------------------------------------------------------  Indications: Peripheral artery disease. High Risk Factors: Hypertension, hyperlipidemia, Diabetes, current smoker.  Comparison Study: No prior study Performing Technologist: Maudry Mayhew MHA, RVT, RDCS, RDMS  Examination Guidelines: A complete evaluation includes at minimum, Doppler waveform signals and systolic blood pressure reading at the level of bilateral brachial, anterior tibial, and posterior tibial arteries, when vessel segments are accessible. Bilateral testing is considered an integral part of a complete examination. Photoelectric Plethysmograph (PPG) waveforms and toe systolic pressure readings are included as required and additional duplex  testing as needed. Limited examinations for reoccurring indications may be performed as noted.  ABI Findings: +---------+------------------+-----+---------+---------+ Right    Rt Pressure (mmHg)IndexWaveform Comment   +---------+------------------+-----+---------+---------+ Brachial                                 PICC line +---------+------------------+-----+---------+---------+ PTA      180               1.34 triphasic          +---------+------------------+-----+---------+---------+ DP       163               1.22 biphasic           +---------+------------------+-----+---------+---------+ Great Toe121               0.90                    +---------+------------------+-----+---------+---------+ +---------+------------------+-----+---------+-------+ Left     Lt Pressure (mmHg)IndexWaveform Comment +---------+------------------+-----+---------+-------+ Brachial 134                                     +---------+------------------+-----+---------+-------+ PTA      107               0.80 triphasic        +---------+------------------+-----+---------+-------+ DP       140               1.04 triphasic        +---------+------------------+-----+---------+-------+  Great Toe102               0.76                  +---------+------------------+-----+---------+-------+ +-------+-----------+-----------+------------+------------+ ABI/TBIToday's ABIToday's TBIPrevious ABIPrevious TBI +-------+-----------+-----------+------------+------------+ Right  1.34       0.90                                +-------+-----------+-----------+------------+------------+ Left   1.04       0.76                                +-------+-----------+-----------+------------+------------+  Summary: Right: Resting right ankle-brachial index is within normal range. No evidence of significant right lower extremity arterial disease. The right toe-brachial index is normal.  Left: Resting left ankle-brachial index is within normal range. No evidence of significant left lower extremity arterial disease. The left toe-brachial index is normal. Guidlines:  Patients with an ABI >1.2 or <0.9 have a high risk of peripheral atherosclerosis. Patients with asymptomatic peripheral atherosclerosis can be managed without further referral, at the discretion of the ordering provider. Guidelines for medical therapy include the following: - Patients with asymptomatic peripheral atherosclerosis can be managed without further referral, at the discretion of the ordering provider. The following therapies are recommendations from the Society of Vascular Surgery for patients with peripheral arterial disease: _ Multidisciplinary comprehensive smoking cessation interventions. _ Blood glucose control with goal A1c < 7%. _ Blood pressure control, < 140/90 mmHg. _ Statin therapy, goal LDL-C at least <100 mg/dL. _ Supervised exercise program (30-60 min of walking, three times a week) for patients with intermittent claudication. _ Aspirin 81mg  by mouth daily  *See table(s) above for measurements and observations.     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  Operative cultures are actually growing Staphylococcus aureus which I did not know when I visited with the patient and willing to the Gram stain showing gram-positive cocci in pairs.  #1 Hardware no showed osteomyelitis:  We will continue vancomycin and exchange ceftriaxone for cefazolin  Hopefully  will have susceptibilities back tomorrow I can pick 1 antibiotic for to go home on.  She will need 6 weeks of parenteral therapy  #2 Neurocardiogenic syncope she told me that Dr.'s Caryl Comes had wanted her seen by neurology which has not yet happened in the outpatient setting.   I spent 36  minutes with the patient including than 50% of the time in face to face counseling of the patient guarding nature of these types of infection and the possible pathogens that could be involved and antibiotics were used to treat them including the risk benefits of each along with review of medical records in preparation for the visit and during the visit and in coordination of her care.   Dr Candiss Norse or Dr. Juleen China will see tomorrow.   LOS: 0 days   Alcide Evener 05/24/2022, 1:17 PM

## 2022-05-24 NOTE — Evaluation (Addendum)
Physical Therapy Evaluation Patient Details Name: Brittney Cooper MRN: [id number] DOB: [birth year] Today's Date: 05/24/2022  History of Present Illness  61 yo admitted with R ankle abscess concern for osteomyelitis s/p debridement and removal of distal R fibula reduction hardware 5/28 PMH NASH, DM, bipolar, OCD, anxiety, panic attack, PTSD, ETOH abuse, alcoholic pancreatitis, smoking, ORIF R ankle 2016, ADHD, cardiac arest x3  Clinical Impression  Pt admitted with above diagnosis. Pt was able to ambulate with RW and with knee scooter with supervision/Modif I.  Overall  pt Modif I in controlled environment and cues only needed for technique with knee scooter (pt plans to use this when she goes on a college visit with her son on June 14).  Pt wants to go home today and feel that pt can do so and would benefit from a few sessions of HHPT as well.  See BPs during rx as below and pt had no symptoms today with PT. Pt currently with functional limitations due to the deficits listed below (see PT Problem List). Pt will benefit from skilled PT to increase their independence and safety with mobility to allow discharge to the venue listed below.       Orthostatic BPs  Supine NT in chair on arrival  Sitting 135/86, 83 bpm  Standing 140/103, 102 bpm  Standing after 3 min 137/92, 88 bpm      Recommendations for follow up therapy are one component of a multi-disciplinary discharge planning process, led by the attending physician.  Recommendations may be updated based on patient status, additional functional criteria and insurance authorization.  Follow Up Recommendations Home health PT    Assistance Recommended at Discharge PRN  Patient can return home with the following  A little help with walking and/or transfers    Equipment Recommendations Rolling walker (2 wheels) (rent knee scooter)  Recommendations for Other Services       Functional Status Assessment Patient has had a recent decline in their  functional status and demonstrates the ability to make significant improvements in function in a reasonable and predictable amount of time.     Precautions / Restrictions Precautions Precautions: Fall Precaution Comments: reports fainting 3 times at least per week. watch BP normally monitors 5 times per day, Required Braces or Orthoses: Other Brace Other Brace: cam boot in room from previous surg Restrictions Weight Bearing Restrictions: No      Mobility  Bed Mobility Overal bed mobility: Independent                  Transfers Overall transfer level: Needs assistance Equipment used: Rolling walker (2 wheels) (knee walker) Transfers: Sit to/from Stand, Bed to chair/wheelchair/BSC Sit to Stand: Supervision, Modified independent (Device/Increase time) Stand pivot transfers: Supervision, Modified independent (Device/Increase time)         General transfer comment: Pt needed supervision for transfer to knee walker as she needed cues for technique initially.  Pt Modif I with transfer to RW.    Ambulation/Gait Ambulation/Gait assistance: Modified independent (Device/Increase time) Gait Distance (Feet): 450 Feet (150 feet and then 300 feet) Assistive device: Rolling walker (2 wheels) (knee walker) Gait Pattern/deviations: Step-to pattern, Decreased step length - right, Decreased stance time - right, Decreased weight shift to right, Antalgic   Gait velocity interpretation: <1.31 ft/sec, indicative of household ambulator   General Gait Details: Pt walked with knee walker initially and has overall good safety awareness.  Pt then walked with RW and good safety with this as well.  No  cues needed.  Pt maneuvers well in controlled environment and with min challenges.  Stairs  Declined practice but PT and pt did verbally discuss up and down steps with RW and pt understands technique.           Wheelchair Mobility    Modified Rankin (Stroke Patients Only)       Balance  Overall balance assessment: Modified Independent                                           Pertinent Vitals/Pain Pain Assessment Pain Assessment: 0-10 Pain Location: R ankle Pain Descriptors / Indicators: Discomfort Pain Intervention(s): Limited activity within patient's tolerance, Monitored during session, Repositioned, Premedicated before session    Home Living Family/patient expects to be discharged to:: Private residence Living Arrangements: Children (33 yo son) Available Help at Discharge: Family Type of Home: House Home Access: Stairs to enter Entrance Stairs-Rails: None Entrance Stairs-Number of Steps: 1   Home Layout: One level Home Equipment: Cane - single point (can has build in seat on it) Additional Comments: has a stool bamboo if needed, faint a few times per week, 2 dogs and ferret. Pt reports small dog being attacked and killed recently and thats where the wrist wound came from. the dog did not survive the attack.    Prior Function Prior Level of Function : Independent/Modified Independent;Driving (disabled)               ADLs Comments: gardening (most recent fall in garden with knee wound that had infection and per pt possible source of infection)     Hand Dominance   Dominant Hand: Right    Extremity/Trunk Assessment   Upper Extremity Assessment Upper Extremity Assessment: Defer to OT evaluation    Lower Extremity Assessment Lower Extremity Assessment: RLE deficits/detail RLE Deficits / Details: Ankle NT due to surgery and boot in place.  knee and hip WFL RLE: Unable to fully assess due to pain;Unable to fully assess due to immobilization    Cervical / Trunk Assessment Cervical / Trunk Assessment: Normal  Communication   Communication: No difficulties  Cognition Arousal/Alertness: Awake/alert Behavior During Therapy: WFL for tasks assessed/performed Overall Cognitive Status: Impaired/Different from baseline Area of  Impairment: Safety/judgement                         Safety/Judgement: Decreased awareness of safety     General Comments: Pt educated on having (A) while in acute due to falls and IV currently attached to pt. pt using RW for pain (A) for R LE and inability manage IV pole and rw safely without (A). pt expressed understanding        General Comments General comments (skin integrity, edema, etc.): noted to have multiple wounds from falls, dressing placed at L heel and R knee cap to allow for don of L shoe and R knee in prep for PT knee scooter trial    Exercises General Exercises - Lower Extremity Long Arc Quad: AROM, Both, 10 reps, Seated   Assessment/Plan    PT Assessment Patient needs continued PT services  PT Problem List Decreased activity tolerance;Decreased balance;Decreased mobility;Decreased knowledge of use of DME;Decreased knowledge of precautions;Decreased safety awareness       PT Treatment Interventions DME instruction;Gait training;Functional mobility training;Therapeutic activities;Therapeutic exercise;Stair training;Balance training;Patient/family education    PT Goals (Current goals can  be found in the Care Plan section)  Acute Rehab PT Goals Patient Stated Goal: to go home PT Goal Formulation: With patient Time For Goal Achievement: 06/07/22 Potential to Achieve Goals: Good    Frequency Min 5X/week     Co-evaluation               AM-PAC PT "6 Clicks" Mobility  Outcome Measure Help needed turning from your back to your side while in a flat bed without using bedrails?: None Help needed moving from lying on your back to sitting on the side of a flat bed without using bedrails?: None Help needed moving to and from a bed to a chair (including a wheelchair)?: A Little Help needed standing up from a chair using your arms (e.g., wheelchair or bedside chair)?: A Little Help needed to walk in hospital room?: A Little Help needed climbing 3-5 steps  with a railing? : A Little 6 Click Score: 20    End of Session Equipment Utilized During Treatment: Gait belt Activity Tolerance: Patient tolerated treatment well Patient left: in chair;with call bell/phone within reach Nurse Communication: Mobility status PT Visit Diagnosis: Muscle weakness (generalized) (M62.81)    Time: [id number] PT Time Calculation (min) (ACUTE ONLY): 31 min   Charges:   PT Evaluation $PT Eval Moderate Complexity: 1 Mod PT Treatments $Gait Training: 8-22 mins        Makya Yurko M,PT Acute Rehab Services [phone] (pager)   Bevelyn Bucklesawn F Kordae Buonocore 05/24/2022, 10:28 AM

## 2022-05-24 NOTE — Progress Notes (Signed)
Subjective: 1 Day Post-Op s/p Procedure(s): INCISION AND DRAINAGE ABSCESS, RIGHT ANKLE HARDWARE REMOVAL   Patient is alert, oriented. Patient reports pain as well controlled with current regimen. Denies chest pain, SOB, Calf pain. No nausea/vomiting. No other complaints.    Objective:  PE: VITALS:   Vitals:   05/23/22 2029 05/24/22 0025 05/24/22 0452 05/24/22 0743  BP: (!) 130/101 104/63 105/67 119/81  Pulse: 99 65 63 73  Resp: 17 18 17 18   Temp: 98.2 F (36.8 C) 98.6 F (37 C) 97.9 F (36.6 C) 98.1 F (36.7 C)  TempSrc: Oral Oral Oral Oral  SpO2: 96% 94% 94% 95%  Weight:      Height:       General: sitting up in chair, in no acute distress.  Resp: normal respiratory effort MSK: RLE dressing intact and CDI. Able to flex and extend all toes. Distal sensation intact. Cap refill intact. Walking in boot without pain.    LABS  Results for orders placed or performed during the hospital encounter of 05/23/22 (from the past 24 hour(s))  Glucose, capillary     Status: Abnormal   Collection Time: 05/23/22 10:29 AM  Result Value Ref Range   Glucose-Capillary 245 (H) 70 - 99 mg/dL  CBC     Status: Abnormal   Collection Time: 05/24/22  2:05 AM  Result Value Ref Range   WBC 7.5 4.0 - 10.5 K/uL   RBC 3.19 (L) 3.87 - 5.11 MIL/uL   Hemoglobin 10.6 (L) 12.0 - 15.0 g/dL   HCT 05/26/22 (L) 62.9 - 52.8 %   MCV 97.5 80.0 - 100.0 fL   MCH 33.2 26.0 - 34.0 pg   MCHC 34.1 30.0 - 36.0 g/dL   RDW 41.3 24.4 - 01.0 %   Platelets 217 150 - 400 K/uL   nRBC 0.0 0.0 - 0.2 %  Comprehensive metabolic panel     Status: Abnormal   Collection Time: 05/24/22  2:05 AM  Result Value Ref Range   Sodium 135 135 - 145 mmol/L   Potassium 3.8 3.5 - 5.1 mmol/L   Chloride 101 98 - 111 mmol/L   CO2 28 22 - 32 mmol/L   Glucose, Bld 375 (H) 70 - 99 mg/dL   BUN 16 6 - 20 mg/dL   Creatinine, Ser 05/26/22 0.44 - 1.00 mg/dL   Calcium 8.9 8.9 - 5.36 mg/dL   Total Protein 5.6 (L) 6.5 - 8.1 g/dL   Albumin 3.0  (L) 3.5 - 5.0 g/dL   AST 13 (L) 15 - 41 U/L   ALT 21 0 - 44 U/L   Alkaline Phosphatase 95 38 - 126 U/L   Total Bilirubin 0.4 0.3 - 1.2 mg/dL   GFR, Estimated 64.4 >03 mL/min   Anion gap 6 5 - 15  Hepatitis B surface antibody,qualitative     Status: Abnormal   Collection Time: 05/24/22  2:05 AM  Result Value Ref Range   Hep B S Ab Reactive (A) NON REACTIVE  Hepatitis B surface antigen     Status: None   Collection Time: 05/24/22  2:05 AM  Result Value Ref Range   Hepatitis B Surface Ag NON REACTIVE NON REACTIVE  Sedimentation rate     Status: Abnormal   Collection Time: 05/24/22  2:05 AM  Result Value Ref Range   Sed Rate 60 (H) 0 - 22 mm/hr  C-reactive protein     Status: Abnormal   Collection Time: 05/24/22  2:05 AM  Result Value  Ref Range   CRP 12.3 (H) <1.0 mg/dL    DG Ankle Right Port  Result Date: 05/23/2022 CLINICAL DATA:  Postop from surgical hardware removal. EXAM: PORTABLE RIGHT ANKLE - 2 VIEW COMPARISON:  05/20/2022. FINDINGS: Distal fibular fixation plate and associated fixation screws have been removed. Fracture is healed. Ankle joint is normally aligned. Lateral soft tissue swelling. IMPRESSION: 1. Status post removal of the distal right fibula reduction hardware. Fracture is healed. Electronically Signed   By: Amie Portland M.D.   On: 05/23/2022 10:17   [place] EKG SITE RITE  Result Date: 05/23/2022 If Site Rite image not attached, placement could not be confirmed due to current cardiac rhythm.  [place] EKG SITE RITE  Result Date: 05/23/2022 If [facility] image not attached, placement could not be confirmed due to current cardiac rhythm.   Assessment/Plan: Infected right ankle hardware - s/p right ankle I&D with hardware removal 05/23/22 Weightbearing: WBAT RLE Insicional and dressing care: Reinforce dressings as needed ID: Appreciate ID consult, On vancomycin and ceftriaxone. operative cultures currently showing gram + cocci in pairs, PICC line has been placed.  VTE  prophylaxis: Aspirin 81mg  BID, early ambulation Pain control: continue current regimen - patient also meets criteria for vascular ultrasound due to multiple comorbidities such as smoking and diabetes, ABI has been ordered per protocol - patient reports multiple syncopal episodes in the past, she recently underwent pre-operative risk assessment with cardiology and plans to follow-up closely with them after discharge.  Follow - up plan: 2 weeks with Dr.  Dispo: pending ABI and culture results, plan to discharge home tomorrow.  Contact information:   Dion Saucier, Janine Ores Cordelia Poche 8-5  After hours and holidays please check [email or web address] for group call information for [facility]  [id number] 05/24/2022, 8:35 AM

## 2022-05-24 NOTE — Progress Notes (Signed)
ABI completed. Refer to "CV Proc" under chart review to view preliminary results.  05/24/2022 1:12 PM Eula Fried., MHA, RVT, RDCS, RDMS

## 2022-05-24 NOTE — Evaluation (Signed)
Occupational Therapy Evaluation Patient Details Name: Brittney Cooper MRN: [id number] DOB: [birth year] Today's Date: 05/24/2022   History of Present Illness 61 yo admitted with R ankle abscess concern for osteomyelitis s/p debridement and removal of distal R fibula reduction hardware 5/28 PMH NASH, DM, bipolar, OCD, anxiety, panic attack, PTSD, ETOH abuse, alcoholic pancreatitis, smoking, ORIF R ankle 2016, ADHD, cardiac arest x3   Clinical Impression   Patient is s/p I&D R ankle with hardware removal surgery resulting in functional limitations due to the deficits listed below (see OT problem list). Pt reports baseline fainting 3 times at least per week and multiple falls with injuries. Pt has scratches and cuts on legs from falls. Pt requesting additional (A) with understanding insurance benefits to get (A) at the home. Pt will have transportation needs upon d/c. Pt with PICC line IV meds currently so HHOT with RN could benefit pt for initial home evaluation for safety/ tub transfers.  Patient will benefit from skilled OT acutely to increase independence and safety with ADLS to allow discharge HHOT.       Recommendations for follow up therapy are one component of a multi-disciplinary discharge planning process, led by the attending physician.  Recommendations may be updated based on patient status, additional functional criteria and insurance authorization.   Follow Up Recommendations  Home health OT    Assistance Recommended at Discharge Set up Supervision/Assistance  Patient can return home with the following A little help with walking and/or transfers;A little help with bathing/dressing/bathroom;Assist for transportation    Functional Status Assessment  Patient has had a recent decline in their functional status and demonstrates the ability to make significant improvements in function in a reasonable and predictable amount of time.  Equipment Recommendations  Other (comment) (knee scooter /  RW- see PT note for recommendations with trial)    Recommendations for Other Services       Precautions / Restrictions Precautions Precautions: Fall Precaution Comments: reports fainting 3 times at least per week. watch BP normally monitors 5 times per day, Required Braces or Orthoses: Other Brace Other Brace: cam boot in room from previous surg Restrictions Weight Bearing Restrictions: No      Mobility Bed Mobility Overal bed mobility: Independent                  Transfers Overall transfer level: Modified independent Equipment used: Rolling walker (2 wheels)                      Balance Overall balance assessment: Mild deficits observed, not formally tested                                         ADL either performed or assessed with clinical judgement   ADL Overall ADL's : Needs assistance/impaired Eating/Feeding: Independent   Grooming: Wash/dry hands;Supervision/safety;Standing Grooming Details (indicate cue type and reason): cues for positioning of RW Upper Body Bathing: Independent   Lower Body Bathing: Supervison/ safety;Sit to/from stand Lower Body Bathing Details (indicate cue type and reason): educated on keeping R LE dry with trash bag and sitting on seat with R LE on edge of tub in guest bathroom to keep it dry and elevated Upper Body Dressing : Independent   Lower Body Dressing: Supervision/safety;Sit to/from stand Lower Body Dressing Details (indicate cue type and reason): able to secure cam boot straps. Min (A) to  position foot into the boot for the first time Toilet Transfer: Supervision/safety;Ambulation;Regular Toilet;Rolling walker (2 wheels)   Toileting- Clothing Manipulation and Hygiene: Supervision/safety       Functional mobility during ADLs: Supervision/safety;Rolling walker (2 wheels) General ADL Comments: using RW this sesison to help with pain management     Vision Baseline Vision/History: 0 No visual  deficits       Perception     Praxis      Pertinent Vitals/Pain Pain Assessment Pain Assessment: 0-10 Pain Score: 8  Pain Location: R ankle Pain Descriptors / Indicators: Discomfort Pain Intervention(s): RN gave pain meds during session, Monitored during session, Premedicated before session, Repositioned     Hand Dominance Right   Extremity/Trunk Assessment Upper Extremity Assessment Upper Extremity Assessment: Overall WFL for tasks assessed   Lower Extremity Assessment Lower Extremity Assessment: Defer to PT evaluation   Cervical / Trunk Assessment Cervical / Trunk Assessment: Normal   Communication Communication Communication: No difficulties   Cognition Arousal/Alertness: Awake/alert Behavior During Therapy: WFL for tasks assessed/performed Overall Cognitive Status: Impaired/Different from baseline Area of Impairment: Safety/judgement                         Safety/Judgement: Decreased awareness of safety     General Comments: Pt educated on having (A) while in acute due to falls and IV currently attached to pt. pt using RW for pain (A) for R LE and inability manage IV pole and rw safely without (A). pt expressed understanding     General Comments  noted to have multiple wounds from falls, dressing placed at L heel and R knee cap to allow for don of L shoe and R knee in prep for PT knee scooter trial    Exercises     Shoulder Instructions      Home Living Family/patient expects to be discharged to:: Private residence Living Arrangements: Children (9 yo son) Available Help at Discharge: Family Type of Home: House Home Access: Stairs to enter Secretary/administrator of Steps: 1 Entrance Stairs-Rails: None Home Layout: One level     Bathroom Shower/Tub: Producer, television/film/video: Standard     Home Equipment: Gilmer Mor - single point (can has build in seat on it)   Additional Comments: has a stool bamboo if needed, faint a few times per  week, 2 dogs and ferret. Pt reports small dog being attacked and killed recently and thats where the wrist wound came from. the dog did not survive the attack.      Prior Functioning/Environment Prior Level of Function : Independent/Modified Independent;Driving (disabled)               ADLs Comments: gardening (most recent fall in garden with knee wound that had infection and per pt possible source of infection)        OT Problem List: Decreased strength;Decreased activity tolerance;Impaired balance (sitting and/or standing);Decreased safety awareness;Decreased knowledge of use of DME or AE;Decreased knowledge of precautions;Cardiopulmonary status limiting activity      OT Treatment/Interventions: Self-care/ADL training;Therapeutic exercise;Energy conservation;DME and/or AE instruction;Therapeutic activities;Patient/family education;Balance training    OT Goals(Current goals can be found in the care plan section) Acute Rehab OT Goals Patient Stated Goal: to get to son June 14 [employer] campus tour OT Goal Formulation: With patient Time For Goal Achievement: 06/07/22 Potential to Achieve Goals: Good  OT Frequency: Min 2X/week    Co-evaluation  AM-PAC OT "6 Clicks" Daily Activity     Outcome Measure Help from another person eating meals?: None Help from another person taking care of personal grooming?: None Help from another person toileting, which includes using toliet, bedpan, or urinal?: A Little Help from another person bathing (including washing, rinsing, drying)?: A Little Help from another person to put on and taking off regular upper body clothing?: None Help from another person to put on and taking off regular lower body clothing?: None 6 Click Score: 22   End of Session Equipment Utilized During Treatment: Gait belt;Rolling walker (2 wheels) Nurse Communication: Mobility status;Precautions  Activity Tolerance: Patient tolerated treatment well Patient  left: in chair;with call bell/phone within reach  OT Visit Diagnosis: Unsteadiness on feet (R26.81);Muscle weakness (generalized) (M62.81)                Time: 0826-0900 OT Time Calculation (min): 34 min Charges:  OT General Charges $OT Visit: 1 Visit OT Evaluation $OT Eval Moderate Complexity: 1 Mod OT Treatments $Self Care/Home Management : 8-22 mins   Brynn, OTR/L  Acute Rehabilitation Services Office: [phone] .   Mateo FlowBrynn Cristopher Ciccarelli 05/24/2022, 9:23 AM

## 2022-05-24 NOTE — TOC Initial Note (Signed)
Transition of Care [facility]) - Initial/Assessment Note    Patient Details  Name: Brittney Cooper MRN: [id number] Date of Birth: [birth year]  Transition of Care [facility]) CM/SW Contact:    Marilu Favre, RN Phone Number: 05/24/2022, 10:15 AM  Clinical Narrative:                 Patient from home with her 59 year old son . Currently her sister is in town for 48 hours. Sister lives 1.5 hours away .   Patient will need IV ABX at home , [place], [place].   Discussed IV ABX at home. Patient's infusion company will be [employer], Pension scheme manager has a nurse Jeannene Patella) who will come and provide teaching with patient and support person prior to discharge. Patient will have a HHRN , however, HHRN will not be present every time a dose is due.   Patient voiced understanding. 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   NCM ordered walker with [employer] that will be delivered to hospital room this morning.   Pam present already to provide teaching.   Patient plans to rent a knee scooter when she takes her son to [place] for orientation June 09, 2022 .   Cindie with [facility] accepted referral for HHRN,PT,OT with start of care Wednesday.   Provided patient with transportation resources. SHe does have Medicaid , provided number for her to arrange Medicaid transportation. Patient also stated her [facility] Medicare plan has transportation benefits. She just needs to call number on card to arrange.      Expected Discharge Plan: [place] Barriers to Discharge: Continued Medical Work up   Patient Goals and CMS Choice Patient states their goals for this hospitalization and ongoing recovery are:: to return to home CMS Medicare.gov Compare Post Acute Care list provided to:: Patient Choice offered to / list presented to : Patient  Expected Discharge Plan and Services Expected Discharge Plan: [place]   Discharge Planning Services: CM Consult Post Acute Care Choice: [place] arrangements for the past 2 months: Single Family Home                 DME Arranged: Walker rolling DME Agency: [employer] Date DME Agency Contacted: 05/24/22 Time DME Agency Contacted: 47 Representative spoke with at DME Agency: jasmine HH Arranged: PT, OT, RN [place] Agency: [employer] Date [place]: 05/24/22 Time [place]: [place] Representative spoke with at [place]: [place] checking to see if she can accept  Prior Living Arrangements/Services Living arrangements for the past 2 months: [place] with::  (26 year old son) Patient language and need for interpreter reviewed:: Yes        Need for Family Participation in Patient Care: Yes (Comment) Care giver support system in place?: Yes (comment)   Criminal Activity/Legal Involvement Pertinent to Current Situation/Hospitalization: No - Comment as needed  Activities of Daily Living Home Assistive Devices/Equipment: Blood pressure cuff, Shower chair without back, Eyeglasses, Scales ADL Screening (condition at time of admission) Patient's cognitive ability adequate to safely complete daily activities?: Yes Is the patient deaf or have difficulty hearing?: No Does the patient have difficulty seeing, even when wearing glasses/contacts?: No Does the patient have difficulty concentrating, remembering, or making decisions?: No Patient able to express need for assistance with ADLs?:  Yes Does the patient have difficulty dressing or bathing?: No Independently performs ADLs?: Yes (appropriate for developmental age) Does the patient have difficulty walking or climbing stairs?: No Weakness of Legs: None Weakness of Arms/Hands: None  Permission Sought/Granted   Permission granted to share information with : No              Emotional Assessment Appearance:: Appears stated  age Attitude/Demeanor/Rapport: Engaged Affect (typically observed): Accepting Orientation: : Oriented to Self, Oriented to Place, Oriented to  Time, Oriented to Situation Alcohol / Substance Use: Not Applicable Psych Involvement: No (comment)  Admission diagnosis:  Infected hardware in right lower extremity ([place]) [id number]] Patient Active Problem List   Diagnosis Date Noted   Infected hardware in right lower extremity ([place]) 05/23/2022   Tobacco abuse 11/30/2020   Hypertension 11/10/2020   Atrial tachycardia ([place]) 03/12/2020   Dysautonomia ([place]) 01/13/2020   Pacemaker 01/13/2020   Type 2 diabetes mellitus with hyperglycemia, without long-term current use of insulin ([place]) 02/09/2017   MDD (major depressive disorder), recurrent severe, without psychosis ([place]) 02/04/2017   Complete heart block ([place])    Palpitation 10/11/2016   Acute pancreatitis 09/15/2016   Alcohol abuse 09/15/2016   Tobacco use 09/15/2016   Anxiety 09/15/2016   Hypokalemia 09/15/2016   Pancreatitis [id number]   Alcoholic pancreatitis [id number]   Alcohol withdrawal ([place]) 07/08/2016   Bipolar disorder ([place]) 07/08/2016   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

## 2022-05-25 DIAGNOSIS — F319 Bipolar disorder, unspecified: Secondary | ICD-10-CM | POA: Diagnosis present

## 2022-05-25 DIAGNOSIS — L02415 Cutaneous abscess of right lower limb: Secondary | ICD-10-CM | POA: Diagnosis present

## 2022-05-25 DIAGNOSIS — F909 Attention-deficit hyperactivity disorder, unspecified type: Secondary | ICD-10-CM | POA: Diagnosis present

## 2022-05-25 DIAGNOSIS — Z83438 Family history of other disorder of lipoprotein metabolism and other lipidemia: Secondary | ICD-10-CM | POA: Diagnosis not present

## 2022-05-25 DIAGNOSIS — F4310 Post-traumatic stress disorder, unspecified: Secondary | ICD-10-CM | POA: Diagnosis present

## 2022-05-25 DIAGNOSIS — Z7984 Long term (current) use of oral hypoglycemic drugs: Secondary | ICD-10-CM | POA: Diagnosis not present

## 2022-05-25 DIAGNOSIS — E119 Type 2 diabetes mellitus without complications: Secondary | ICD-10-CM

## 2022-05-25 DIAGNOSIS — Z8249 Family history of ischemic heart disease and other diseases of the circulatory system: Secondary | ICD-10-CM | POA: Diagnosis not present

## 2022-05-25 DIAGNOSIS — Z888 Allergy status to other drugs, medicaments and biological substances status: Secondary | ICD-10-CM | POA: Diagnosis not present

## 2022-05-25 DIAGNOSIS — F419 Anxiety disorder, unspecified: Secondary | ICD-10-CM | POA: Diagnosis present

## 2022-05-25 DIAGNOSIS — T847XXA Infection and inflammatory reaction due to other internal orthopedic prosthetic devices, implants and grafts, initial encounter: Secondary | ICD-10-CM

## 2022-05-25 DIAGNOSIS — F17210 Nicotine dependence, cigarettes, uncomplicated: Secondary | ICD-10-CM | POA: Diagnosis present

## 2022-05-25 DIAGNOSIS — M86061 Acute hematogenous osteomyelitis, right tibia and fibula: Secondary | ICD-10-CM | POA: Diagnosis present

## 2022-05-25 DIAGNOSIS — Z885 Allergy status to narcotic agent status: Secondary | ICD-10-CM | POA: Diagnosis not present

## 2022-05-25 DIAGNOSIS — Z79899 Other long term (current) drug therapy: Secondary | ICD-10-CM | POA: Diagnosis not present

## 2022-05-25 DIAGNOSIS — T84624A Infection and inflammatory reaction due to internal fixation device of right fibula, initial encounter: Secondary | ICD-10-CM | POA: Diagnosis present

## 2022-05-25 DIAGNOSIS — Z95810 Presence of automatic (implantable) cardiac defibrillator: Secondary | ICD-10-CM | POA: Diagnosis not present

## 2022-05-25 DIAGNOSIS — Z7982 Long term (current) use of aspirin: Secondary | ICD-10-CM | POA: Diagnosis not present

## 2022-05-25 DIAGNOSIS — I442 Atrioventricular block, complete: Secondary | ICD-10-CM | POA: Diagnosis present

## 2022-05-25 DIAGNOSIS — M86171 Other acute osteomyelitis, right ankle and foot: Secondary | ICD-10-CM

## 2022-05-25 DIAGNOSIS — G8929 Other chronic pain: Secondary | ICD-10-CM | POA: Diagnosis present

## 2022-05-25 DIAGNOSIS — E1169 Type 2 diabetes mellitus with other specified complication: Secondary | ICD-10-CM | POA: Diagnosis present

## 2022-05-25 DIAGNOSIS — K219 Gastro-esophageal reflux disease without esophagitis: Secondary | ICD-10-CM | POA: Diagnosis present

## 2022-05-25 DIAGNOSIS — Y831 Surgical operation with implant of artificial internal device as the cause of abnormal reaction of the patient, or of later complication, without mention of misadventure at the time of the procedure: Secondary | ICD-10-CM | POA: Diagnosis present

## 2022-05-25 DIAGNOSIS — E785 Hyperlipidemia, unspecified: Secondary | ICD-10-CM | POA: Diagnosis present

## 2022-05-25 DIAGNOSIS — I10 Essential (primary) hypertension: Secondary | ICD-10-CM | POA: Diagnosis present

## 2022-05-25 LAB — HCV INTERPRETATION

## 2022-05-25 LAB — CBC
HCT: 30.5 % — ABNORMAL LOW (ref 36.0–46.0)
Hemoglobin: 9.8 g/dL — ABNORMAL LOW (ref 12.0–15.0)
MCH: 32.5 pg (ref 26.0–34.0)
MCHC: 32.1 g/dL (ref 30.0–36.0)
MCV: 101 fL — ABNORMAL HIGH (ref 80.0–100.0)
Platelets: 212 10*3/uL (ref 150–400)
RBC: 3.02 MIL/uL — ABNORMAL LOW (ref 3.87–5.11)
RDW: 13.5 % (ref 11.5–15.5)
WBC: 5.7 10*3/uL (ref 4.0–10.5)
nRBC: 0 % (ref 0.0–0.2)

## 2022-05-25 LAB — GLUCOSE, CAPILLARY
Glucose-Capillary: 187 mg/dL — ABNORMAL HIGH (ref 70–99)
Glucose-Capillary: 185 mg/dL — ABNORMAL HIGH (ref 70–99)

## 2022-05-25 LAB — HCV AB W REFLEX TO QUANT PCR: HCV Ab: NONREACTIVE

## 2022-05-25 MED ORDER — OXYCODONE HCL 10 MG PO TABS: 10.0000 mg | ORAL_TABLET | Freq: Three times a day (TID) | ORAL | 0 refills | Status: DC | PRN

## 2022-05-25 MED ORDER — ONDANSETRON HCL 4 MG PO TABS: 4.0000 mg | ORAL_TABLET | Freq: Three times a day (TID) | ORAL | 0 refills | Status: DC | PRN

## 2022-05-25 MED ORDER — CEFAZOLIN IV (FOR PTA / DISCHARGE USE ONLY): 2.0000 g | Freq: Three times a day (TID) | INTRAVENOUS | 0 refills | Status: AC

## 2022-05-25 NOTE — Progress Notes (Signed)
Occupational Therapy Treatment Patient Details Name: Brittney Cooper MRN: [id number] DOB: [birth year] Today's Date: 05/25/2022   History of present illness 61 yo admitted with R ankle abscess concern for osteomyelitis s/p debridement and removal of distal R fibula reduction hardware 5/28 PMH NASH, DM, bipolar, OCD, anxiety, panic attack, PTSD, ETOH abuse, alcoholic pancreatitis, smoking, ORIF R ankle 2016, ADHD, cardiac arest x3   OT comments  Pt progressed with session with ability to don cam boot and complete transfer with RW Supervision level. Pt requires cues for safety that prevents MOD I level at this time. Pt with breakfast present but not eating. Pt now up in chair eating breakfast. Recommendation HHOT.   Pt noted pushing buttons on the IV to silence and reading the IV needs ( air in line).    Recommendations for follow up therapy are one component of a multi-disciplinary discharge planning process, led by the attending physician.  Recommendations may be updated based on patient status, additional functional criteria and insurance authorization.    Follow Up Recommendations  Home health OT    Assistance Recommended at Discharge Set up Supervision/Assistance  Patient can return home with the following  A little help with walking and/or transfers;A little help with bathing/dressing/bathroom;Assist for transportation   Equipment Recommendations  Other (comment)    Recommendations for Other Services      Precautions / Restrictions Precautions Precautions: Fall Precaution Comments: reports fainting 3 times at least per week. watch BP normally monitors 5 times per day, Required Braces or Orthoses: Other Brace Other Brace: cam boot in room from previous surg       Mobility Bed Mobility Overal bed mobility: Independent                  Transfers Overall transfer level: Needs assistance Equipment used: Rolling walker (2 wheels) Transfers: Sit to/from Stand, Bed to  chair/wheelchair/BSC Sit to Stand: Supervision                 Balance Overall balance assessment: Mild deficits observed, not formally tested                                         ADL either performed or assessed with clinical judgement   ADL Overall ADL's : Needs assistance/impaired Eating/Feeding: Independent   Grooming: Supervision/safety;Standing Grooming Details (indicate cue type and reason): abandoned RW pushed it off to the side and attempting to leave bathroom without it. pt unable to progress without reaching for environmental supports. education on why RW is there to help with fall risk and pain management with some pressure relief to the R foot             Lower Body Dressing: Independent Lower Body Dressing Details (indicate cue type and reason): don boot supine without cues or assistance Toilet Transfer: Modified Independent;Ambulation;Regular Toilet   Toileting- Clothing Manipulation and Hygiene: Modified independent;Sit to/from stand       Functional mobility during ADLs: Supervision/safety;Rolling walker (2 wheels) General ADL Comments: continues to need RW education. pt asking about knee scooter that she was told was in the hallway. Pt educated the knee scooter belongs to acute care and is present for education with PTA today.    Extremity/Trunk Assessment Upper Extremity Assessment Upper Extremity Assessment: Overall WFL for tasks assessed   Lower Extremity Assessment Lower Extremity Assessment: Defer to PT evaluation  Vision       Perception     Praxis      Cognition Arousal/Alertness: Awake/alert Behavior During Therapy: WFL for tasks assessed/performed Overall Cognitive Status: Impaired/Different from baseline Area of Impairment: Safety/judgement                         Safety/Judgement: Decreased awareness of safety     General Comments: pt attempting to abandon RW in the bathroom and needs cue  to keep RW. pt asking again this session if the cam boot is required. Pt advised per MD notes at this time Cam boot is recommended. MD will need to d/c cam boot.        Exercises      Shoulder Instructions       General Comments      Pertinent Vitals/ Pain       Pain Assessment Pain Assessment: Faces Faces Pain Scale: Hurts little more Pain Location: R ankle Pain Descriptors / Indicators: Discomfort Pain Intervention(s): Monitored during session, Premedicated before session, Repositioned  Home Living                                          Prior Functioning/Environment              Frequency  Min 2X/week        Progress Toward Goals  OT Goals(current goals can now be found in the care plan section)  Progress towards OT goals: Progressing toward goals  Acute Rehab OT Goals Patient Stated Goal: to go home today and to save all the cups from peanut butter/ other foods to put in doll houses OT Goal Formulation: With patient Time For Goal Achievement: 06/07/22 Potential to Achieve Goals: Good ADL Goals Pt Will Perform Tub/Shower Transfer: Tub transfer;with supervision;ambulating;shower seat Additional ADL Goal #1: pt will demonstrates energy conservation x2 from handout with adls mod I  Plan Discharge plan remains appropriate    Co-evaluation                 AM-PAC OT "6 Clicks" Daily Activity     Outcome Measure   Help from another person eating meals?: None Help from another person taking care of personal grooming?: None Help from another person toileting, which includes using toliet, bedpan, or urinal?: A Little Help from another person bathing (including washing, rinsing, drying)?: A Little Help from another person to put on and taking off regular upper body clothing?: None Help from another person to put on and taking off regular lower body clothing?: None 6 Click Score: 22    End of Session Equipment Utilized During  Treatment: Rolling walker (2 wheels)  OT Visit Diagnosis: Unsteadiness on feet (R26.81);Muscle weakness (generalized) (M62.81)   Activity Tolerance Patient tolerated treatment well   Patient Left in chair;with call bell/phone within reach   Nurse Communication Mobility status;Precautions        Time: [id number] OT Time Calculation (min): 17 min  Charges: OT General Charges $OT Visit: 1 Visit OT Treatments $Self Care/Home Management : 8-22 mins   Brynn, OTR/L  Acute Rehabilitation Services Office: [phone] .   Jeri Modena 05/25/2022, 10:07 AM

## 2022-05-25 NOTE — Progress Notes (Signed)
Physical Therapy Treatment Patient Details Name: Brittney Cooper MRN: [id number] DOB: 01/19/1961 Today's Date: 05/25/2022   History of Present Illness 61 yo admitted with R ankle abscess concern for osteomyelitis s/p debridement and removal of distal R fibula reduction hardware 5/28 PMH NASH, DM, bipolar, OCD, anxiety, panic attack, PTSD, ETOH abuse, alcoholic pancreatitis, smoking, ORIF R ankle 2016, ADHD, cardiac arest x3    PT Comments    Pt received OOB in recliner and agreeable to session with good progress towards goals with focus on continued training with knee scooter and initiation of stair training. Pt able to demonstrate increased ambulation distance with knee scooter with good stability and ability to navigate obstacles without LOB at supervision level. Pt able to ascend/descend 3 stairs in stairwell with no LOB and good recall of sequencing with min guard assist for safety. Current plan remains appropriate to address deficits and maximize functional independence. Pt continues to benefit from skilled PT services to progress toward functional mobility goals.     Recommendations for follow up therapy are one component of a multi-disciplinary discharge planning process, led by the attending physician.  Recommendations may be updated based on patient status, additional functional criteria and insurance authorization.  Follow Up Recommendations  Home health PT     Assistance Recommended at Discharge PRN  Patient can return home with the following A little help with walking and/or transfers   Equipment Recommendations  Rolling walker (2 wheels) (rent knee scooter)    Recommendations for Other Services       Precautions / Restrictions Precautions Precautions: Fall Precaution Comments: reports fainting 3 times at least per week. watch BP normally monitors 5 times per day, Required Braces or Orthoses: Other Brace Other Brace: cam boot in room from previous surg Restrictions Weight  Bearing Restrictions: No     Mobility  Bed Mobility Overal bed mobility: Independent                  Transfers Overall transfer level: Needs assistance Equipment used: Rolling walker (2 wheels) Transfers: Sit to/from Stand, Bed to chair/wheelchair/BSC Sit to Stand: Supervision                Ambulation/Gait Ambulation/Gait assistance: Modified independent (Device/Increase time) Gait Distance (Feet): 400 Feet Assistive device: Rolling walker (2 wheels) (knee walker) Gait Pattern/deviations: Step-to pattern, Decreased step length - right, Decreased stance time - right, Decreased weight shift to right, Antalgic       General Gait Details: Pt walked with knee walker initially and has overall good safety awareness.  Pt maneuvers well in controlled environment and with min challenges.   Stairs Stairs: Yes Stairs assistance: Min guard Stair Management: One rail Left, One rail Right, Step to pattern, Forwards Number of Stairs: 3 General stair comments: up/down 3 steps in stairwell, no LOB, good recall of sequencing, limited secondary to IV length   Wheelchair Mobility    Modified Rankin (Stroke Patients Only)       Balance Overall balance assessment: Mild deficits observed, not formally tested                                          Cognition Arousal/Alertness: Awake/alert Behavior During Therapy: WFL for tasks assessed/performed Overall Cognitive Status: Impaired/Different from baseline Area of Impairment: Safety/judgement  Safety/Judgement: Decreased awareness of safety     General Comments: pt asking if the cam boot is required. Pt advised per MD notes at this time Cam boot is recommended. MD will need to d/c cam boot.        Exercises      General Comments        Pertinent Vitals/Pain Pain Assessment Pain Assessment: Faces Faces Pain Scale: Hurts little more Pain Location: R ankle  incision site Pain Descriptors / Indicators: Discomfort Pain Intervention(s): Limited activity within patient's tolerance, Monitored during session, Premedicated before session    Home Living                          Prior Function            PT Goals (current goals can now be found in the care plan section) Acute Rehab PT Goals Patient Stated Goal: to go home PT Goal Formulation: With patient Time For Goal Achievement: 06/07/22    Frequency    Min 5X/week      PT Plan      Co-evaluation              AM-PAC PT "6 Clicks" Mobility   Outcome Measure  Help needed turning from your back to your side while in a flat bed without using bedrails?: None Help needed moving from lying on your back to sitting on the side of a flat bed without using bedrails?: None Help needed moving to and from a bed to a chair (including a wheelchair)?: A Little Help needed standing up from a chair using your arms (e.g., wheelchair or bedside chair)?: A Little Help needed to walk in hospital room?: A Little Help needed climbing 3-5 steps with a railing? : A Little 6 Click Score: 20    End of Session Equipment Utilized During Treatment: Gait belt Activity Tolerance: Patient tolerated treatment well Patient left: in chair;with call bell/phone within reach Nurse Communication: Mobility status PT Visit Diagnosis: Muscle weakness (generalized) (M62.81)     Time: [id number] PT Time Calculation (min) (ACUTE ONLY): 26 min  Charges:  $Gait Training: 23-37 mins                     Audry Riles. PTA Acute Rehabilitation Services Office: [place] 05/25/2022, 11:22 AM

## 2022-05-25 NOTE — Discharge Summary (Signed)
Discharge Summary  Patient ID: Brittney Cooper MRN: [id number] DOB/AGE: 1961/04/10 61 y.o.  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, 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   Allergy    Anxiety    Arthritis    Bipolar disorder ([place])    Cardiac arrest ([place]) 06-2015, 08-2016   x3- pt has pacemaker    Chronic pain    back   Complication of anesthesia    woke up during 2 surgical procedures   Depression    Diabetes mellitus without complication ([place])    Type 2   Fatty liver    Fracture of lateral malleolus of right fibula    GERD (gastroesophageal reflux disease)    Hepatomegaly    Hyperlipidemia    Hypertension    Neuromuscular disorder (HCC)    Dystonia   OCD (obsessive compulsive disorder)    Pancreatic pseudocyst    Panic attack    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 female who was admitted 05/23/2022 with a diagnosis of Infected hardware in right lower extremity ([place]) and went to the operating room on 05/23/2022 and underwent the above named procedures.    She was given perioperative antibiotics:  Anti-infectives (From admission, onward)    Start      Dose/Rate Route Frequency Ordered Stop   05/25/22 0800  vancomycin (VANCOCIN) IVPB 1000 mg/200 mL premix  Status:  Discontinued        1,000 mg 200 mL/hr over 60 Minutes Intravenous Every 12 hours 05/24/22 2012 05/25/22 1047   05/25/22 0000  ceFAZolin (ANCEF) IVPB        2 g Intravenous Every 8 hours 05/25/22 1051 07/04/22 2359   05/24/22 1415  ceFAZolin (ANCEF) IVPB 2g/100 mL premix        2 g 200 mL/hr over 30 Minutes Intravenous Every 8 hours 05/24/22 1323     05/24/22 0500  vancomycin (VANCOCIN) IVPB 1000 mg/200 mL premix  Status:  Discontinued        1,000 mg 200 mL/hr over 60 Minutes Intravenous Every 12 hours 05/23/22 1552 05/24/22 2012   05/23/22 2000  vancomycin (VANCOCIN) IVPB 1000 mg/200 mL premix  Status:  Discontinued        1,000 mg 200 mL/hr over 60 Minutes Intravenous Every 12 hours 05/23/22 1125 05/23/22 1538   05/23/22 1630  vancomycin (VANCOREADY) IVPB 1750 mg/350 mL        1,750 mg 175 mL/hr over 120 Minutes Intravenous  Once 05/23/22 1544 05/23/22 2107   05/23/22 1600  cefTRIAXone (ROCEPHIN) 2 g in sodium chloride 0.9 % 100 mL IVPB  Status:  Discontinued        2 g 200 mL/hr over 30  Minutes Intravenous Every 24 hours 05/23/22 1433 05/24/22 1322   05/23/22 1000  vancomycin (VANCOCIN) 1,000 mg in sodium chloride 0.9 % 250 mL IVPB  Status:  Discontinued        1,000 mg 250 mL/hr over 60 Minutes Intravenous Every 12 hours 05/23/22 0745 05/23/22 0746   05/23/22 0824  vancomycin (VANCOCIN) powder  Status:  Discontinued          As needed 05/23/22 0825 05/23/22 0849   05/23/22 0800  vancomycin (VANCOCIN) IVPB 1000 mg/200 mL premix        1,000 mg 200 mL/hr over 60 Minutes Intravenous  Once 05/23/22 0746 05/23/22 0911   05/23/22 0732  ceFAZolin (ANCEF) 2-4 GM/100ML-% IVPB       Note to Pharmacy: Marga Melnick C: cabinet override      05/23/22 0732 05/23/22 1944     .  She was given sequential compression devices, early ambulation, and aspirin for DVT  prophylaxis.  She benefited maximally from the hospital stay and there were no complications.  She will discharge home on IV cefazolin for 6 weeks.   Recent vital signs:  Vitals:   05/25/22 0413 05/25/22 0756  BP: 118/86 134/90  Pulse: 70 78  Resp: 18 17  Temp: 98 F (36.7 C) 97.9 F (36.6 C)  SpO2: 97% 95%    Recent laboratory studies:  Lab Results  Component Value Date   HGB 9.8 (L) 05/25/2022   HGB 10.6 (L) 05/24/2022   HGB 12.8 05/23/2022   Lab Results  Component Value Date   WBC 5.7 05/25/2022   PLT 212 05/25/2022   Lab Results  Component Value Date   INR 1.1 10/29/2020   Lab Results  Component Value Date   NA 135 05/24/2022   K 3.8 05/24/2022   CL 101 05/24/2022   CO2 28 05/24/2022   BUN 16 05/24/2022   CREATININE 0.74 05/24/2022   GLUCOSE 375 (H) 05/24/2022    Discharge Medications:   Allergies as of 05/25/2022       Reactions   Codeine Nausea And Vomiting   Demerol [meperidine] Other (See Comments)   Hallucinations   Jardiance [empagliflozin] Other (See Comments)   Yeast infections   Lopressor [metoprolol] Other (See Comments)   nightmares        Medication List     STOP taking these medications    ibuprofen 200 MG tablet Commonly known as: ADVIL   pantoprazole 40 MG tablet Commonly known as: PROTONIX   thiamine 100 MG tablet       TAKE these medications    ALPRAZolam 0.5 MG tablet Commonly known as: XANAX Take 0.25-0.5 mg by mouth 3 (three) times daily as needed for anxiety.   Amantadine HCl 100 MG tablet Take 50-100 mg by mouth 2 (two) times daily.   amphetamine-dextroamphetamine 30 MG tablet Commonly known as: ADDERALL Take 30 mg by mouth 2 (two) times daily. Does not take 2nd dose after 3pm   aspirin EC 81 MG tablet Take 1 tablet (81 mg total) by mouth daily. For heart health   benztropine 1 MG tablet Commonly known as: COGENTIN Take 0.5-1 mg by mouth 2 (two) times daily.   bisoprolol 5 MG tablet Commonly known  as: ZEBETA TAKE 1 TABLET BY MOUTH DAILY What changed:  when to take this reasons to take this   calcium carbonate 1500 (600 Ca) MG Tabs tablet Commonly known as: OSCAL Take by mouth 2 (two) times daily with a meal.   ceFAZolin  IVPB Commonly known as: ANCEF Inject 2 g into the vein every 8 (eight) hours. Indication:  Osteomyelitis First Dose: Yes Last Day of Therapy:  07/04/2022 Labs - Once weekly:  CBC/D and BMP, Labs - Every other week:  ESR and CRP Method of administration: IV Push Method of administration may be changed at the discretion of home infusion pharmacist based upon assessment of the patient and/or caregiver's ability to self-administer the medication ordered.   divalproex 250 MG 24 hr tablet Commonly known as: DEPAKOTE ER Take 250 mg by mouth every evening.   doxazosin 1 MG tablet Commonly known as: CARDURA TAKE 1 TABLET BY MOUTH AT BEDTIME   DULoxetine 60 MG capsule Commonly known as: CYMBALTA Take 60 mg by mouth in the morning.   fenofibrate 145 MG tablet Commonly known as: TRICOR Take 145 mg by mouth daily.   flecainide 50 MG tablet Commonly known as: TAMBOCOR TAKE ONE (1) TABLET BY MOUTH TWO (2) TIMES DAILY What changed: See the new instructions.   folic acid 1 MG tablet Commonly known as: FOLVITE Take 1 mg by mouth in the morning.   gabapentin 300 MG capsule Commonly known as: NEURONTIN Take 300 mg by mouth at bedtime. What changed: Another medication with the same name was removed. Continue taking this medication, and follow the directions you see here.   glipiZIDE 5 MG 24 hr tablet Commonly known as: GLUCOTROL XL Take 5 mg by mouth daily.   losartan 25 MG tablet Commonly known as: COZAAR TAKE ONE (1) TABLET BY MOUTH EACH DAY What changed: See the new instructions.   magnesium gluconate 500 MG tablet Commonly known as: MAGONATE Take 500 mg by mouth daily.   metFORMIN 1000 MG tablet Commonly known as: GLUCOPHAGE Take 1,000 mg by mouth  2 (two) times daily.   midodrine 2.5 MG tablet Commonly known as: PROAMATINE Take 1 tablet (2.5 mg total) by mouth 2 (two) times daily as needed. What changed:  when to take this reasons to take this   mupirocin ointment 2 % Commonly known as: BACTROBAN Apply topically 2 (two) times daily.   naproxen sodium 220 MG tablet Commonly known as: ALEVE Take 220 mg by mouth 2 (two) times daily as needed.   omeprazole 40 MG capsule Commonly known as: PRILOSEC Take 40 mg by mouth 2 (two) times daily.   ondansetron 4 MG tablet Commonly known as: Zofran Take 1 tablet (4 mg total) by mouth every 8 (eight) hours as needed for nausea or vomiting.   Oxycodone HCl 10 MG Tabs Take 1 tablet (10 mg total) by mouth 3 (three) times daily as needed (severe pain).   QUEtiapine 100 MG tablet Commonly known as: SEROQUEL Take 200 mg by mouth at bedtime.   rOPINIRole 0.5 MG tablet Commonly known as: REQUIP Take 0.5 mg by mouth at bedtime.   rosuvastatin 20 MG tablet Commonly known as: CRESTOR Take 20 mg by mouth daily.               Durable Medical Equipment  (From admission, onward)           Start     Ordered   05/24/22 1014  For home use only DME Walker rolling  Once       Question Answer Comment  Walker: With 5 Inch Wheels   Patient needs a walker to treat with the following condition Weakness      05/24/22 1013  Discharge Care Instructions  (From admission, onward)           Start     Ordered   05/25/22 0000  Change dressing on IV access line weekly and PRN  (Home infusion instructions - [facility] )        05/25/22 1051            Diagnostic Studies: DG Ankle Right Port  Result Date: 05/23/2022 CLINICAL DATA:  Postop from surgical hardware removal. EXAM: PORTABLE RIGHT ANKLE - 2 VIEW COMPARISON:  05/20/2022. FINDINGS: Distal fibular fixation plate and associated fixation screws have been removed. Fracture is healed. Ankle  joint is normally aligned. Lateral soft tissue swelling. IMPRESSION: 1. Status post removal of the distal right fibula reduction hardware. Fracture is healed. Electronically Signed   By: Lajean Manes M.D.   On: 05/23/2022 10:17   [place] EKG SITE RITE  Result Date: 05/23/2022 If Site Rite image not attached, placement could not be confirmed due to current cardiac rhythm.  [place] EKG SITE RITE  Result Date: 05/23/2022 If [facility] image not attached, placement could not be confirmed due to current cardiac rhythm.  Inpatient: VAS [place] PAD ABI  Result Date: 05/25/2022  LOWER EXTREMITY DOPPLER STUDY Patient Name:  Brittney Cooper  Date of Exam:   05/24/2022 Medical Rec #: [id number]      Accession #:    [id number] Date of Birth: 1961-12-02       Patient Gender: F Patient Age:   23 years Exam Location:  [facility] Procedure:      VAS [place] ABI WITH/WO TBI Referring Phys: Merlene Pulling --------------------------------------------------------------------------------  Indications: Peripheral artery disease. High Risk Factors: Hypertension, hyperlipidemia, Diabetes, current smoker.  Comparison Study: No prior study Performing Technologist: Maudry Mayhew MHA, RVT, RDCS, RDMS  Examination Guidelines: A complete evaluation includes at minimum, Doppler waveform signals and systolic blood pressure reading at the level of bilateral brachial, anterior tibial, and posterior tibial arteries, when vessel segments are accessible. Bilateral testing is considered an integral part of a complete examination. Photoelectric Plethysmograph (PPG) waveforms and toe systolic pressure readings are included as required and additional duplex testing as needed. Limited examinations for reoccurring indications may be performed as noted.  ABI Findings: +---------+------------------+-----+---------+---------+ Right    Rt Pressure (mmHg)IndexWaveform Comment   +---------+------------------+-----+---------+---------+ Brachial                                  PICC line +---------+------------------+-----+---------+---------+ PTA      180               1.34 triphasic          +---------+------------------+-----+---------+---------+ DP       163               1.22 biphasic           +---------+------------------+-----+---------+---------+ Great Toe121               0.90                    +---------+------------------+-----+---------+---------+ +---------+------------------+-----+---------+-------+ Left     Lt Pressure (mmHg)IndexWaveform Comment +---------+------------------+-----+---------+-------+ Brachial 134                                     +---------+------------------+-----+---------+-------+ PTA      107  0.80 triphasic        +---------+------------------+-----+---------+-------+ DP       140               1.04 triphasic        +---------+------------------+-----+---------+-------+ Great Toe102               0.76                  +---------+------------------+-----+---------+-------+ +-------+-----------+-----------+------------+------------+ ABI/TBIToday's ABIToday's TBIPrevious ABIPrevious TBI +-------+-----------+-----------+------------+------------+ Right  1.34       0.90                                +-------+-----------+-----------+------------+------------+ Left   1.04       0.76                                +-------+-----------+-----------+------------+------------+  Summary: Right: Resting right ankle-brachial index is within normal range. No evidence of significant right lower extremity arterial disease. The right toe-brachial index is normal. Left: Resting left ankle-brachial index is within normal range. No evidence of significant left lower extremity arterial disease. The left toe-brachial index is normal. Guidlines:  Patients with an ABI >1.2 or <0.9 have a high risk of peripheral atherosclerosis. Patients with asymptomatic peripheral  atherosclerosis can be managed without further referral, at the discretion of the ordering provider. Guidelines for medical therapy include the following: - Patients with asymptomatic peripheral atherosclerosis can be managed without further referral, at the discretion of the ordering provider. The following therapies are recommendations from the Society of Vascular Surgery for patients with peripheral arterial disease: _ Multidisciplinary comprehensive smoking cessation interventions. _ Blood glucose control with goal A1c < 7%. _ Blood pressure control, < 140/90 mmHg. _ Statin therapy, goal LDL-C at least <100 mg/dL. _ Supervised exercise program (30-60 min of walking, three times a week) for patients with intermittent claudication. _ Aspirin 64m by mouth daily  *See table(s) above for measurements and observations.  Electronically signed by CDeitra MayoMD on 05/25/2022 at 10:52:10 AM.    Final     Disposition: Discharge disposition: 06-Home-Health Care Svc       Discharge Instructions     [facility] pharmacist to adjust dose for Vancomycin, Aminoglycosides and other anti-infective therapies as requested by physician.   Complete by: As directed    [facility] to provide Cath Flo 253m  Complete by: As directed    Administer for PICC line occlusion and as ordered by physician for other access device issues.   Anaphylaxis Kit: Provided to treat any anaphylactic reaction to the medication being provided to the patient if First Dose or when requested by physician   Complete by: As directed    Epinephrine 30m68ml vial / amp: Administer 0.3mg78m.3ml)70mbcutaneously once for moderate to severe anaphylaxis, nurse to call physician and pharmacy when reaction occurs and call 911 if needed for immediate care   Diphenhydramine 50mg/79mV vial: Administer 25-50mg I68m PRN for first dose reaction, rash, itching, mild reaction, nurse to call physician and pharmacy when reaction  occurs   Sodium Chloride 0.9% NS 500ml IV75mminister if needed for hypovolemic blood pressure drop or as ordered by physician after call to physician with anaphylactic reaction   Change dressing on IV access line weekly and PRN   Complete by: As directed    Flush  IV access with Sodium Chloride 0.9% and Heparin 10 units/ml or 100 units/ml   Complete by: As directed    Home infusion instructions - [facility]   Complete by: As directed    Instructions: Flush IV access with Sodium Chloride 0.9% and Heparin 10units/ml or 100units/ml   Change dressing on IV access line: Weekly and PRN   Instructions Cath Flo 21m: Administer for PICC Line occlusion and as ordered by physician for other access device   [facility] pharmacist to adjust dose for: Vancomycin, Aminoglycosides and other anti-infective therapies as requested by physician   Method of administration may be changed at the discretion of home infusion pharmacist based upon assessment of the patient and/or caregiver's ability to self-administer the medication ordered   Complete by: As directed         Follow-up Information     MRenette Butters MD. Go on 05/28/2022.   Specialty: Orthopedic Surgery Contact information: [phone]         [facility]   Specialty: [facility] Contact information: [address] [place] [phone]        [facility] Follow up.   Why: 3Edgar DO Follow up on 06/10/2022.   Specialties: Infectious Diseases, Internal Medicine [facility]: [facility] [phone]                 Signed: BVentura BrunsPA-C 05/25/2022, 11:35 AM

## 2022-05-25 NOTE — Progress Notes (Signed)
Mobility Specialist Progress Note:   05/25/22 1200  Mobility  Activity Ambulated independently in room  Level of Assistance Modified independent, requires aide device or extra time  Assistive Device Other (Comment) (IV Pole)  Distance Ambulated (ft) 50 ft  Activity Response Tolerated well  $Mobility charge 1 Mobility   Pt received ambulating in room with no boot or knee scooter. Educated pt on importance of wearing boot while ambulating, states it aggravates incision site. Pt left in room with RN present to go over d/c paperwork.  [facility] Secure Chat or Office Phone: [phone]

## 2022-05-25 NOTE — Progress Notes (Signed)
[facility]  Date of Admission:  05/23/2022           Reason for visit: Follow up on osteofixation infection  Current antibiotics: Vancomycin Cefazolin  ASSESSMENT:    61 y.o. female admitted with:  Osteofixation infection and abscess of the right ankle: Status post removal of hardware, debridement, and I&D on 05/23/22 with Dr Mardelle Matte.  Cultures from OR growing MSSA and PICC line in place.  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  RECOMMENDATIONS:    Continue cefazolin Stop vancomycin Wound care Lab monitoring Glycemic control See OPAT below  Diagnosis: Osteofixation infection and abscess  Culture Result: MSSA  Allergies  Allergen Reactions   Codeine Nausea And Vomiting   Demerol [Meperidine] Other (See Comments)    Hallucinations    Jardiance [Empagliflozin] Other (See Comments)    Yeast infections   Lopressor [Metoprolol] Other (See Comments)    nightmares    OPAT Orders Discharge antibiotics to be given via PICC line Discharge antibiotics: Per pharmacy protocol cefazolin 2gm q8h  Duration: 6 weeks  End Date: 07/04/22  [facility] Care Per Protocol:  Home health RN for IV administration and teaching; PICC line care and labs.    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 ([place]) Active Problems:   Acute hematogenous osteomyelitis of right fibula ([place])    MEDICATIONS:    Scheduled Meds:  amphetamine-dextroamphetamine  30 mg Oral 2 times per day   aspirin EC  81 mg Oral BID   benztropine  0.5-1 mg Oral BID   Chlorhexidine Gluconate Cloth  6  each Topical Daily   divalproex  250 mg Oral QPM   docusate sodium  100 mg Oral BID   doxazosin  1 mg Oral QHS   DULoxetine  60 mg Oral q AM   fenofibrate  160 mg Oral Daily   flecainide  50 mg Oral BID   folic acid  1 mg Oral q AM   gabapentin  300 mg Oral QHS   glipiZIDE  5 mg Oral Q breakfast   insulin aspart  0-15 Units Subcutaneous TID WC   magnesium gluconate  500 mg Oral Daily   metFORMIN  1,000 mg Oral BID WC   pantoprazole  40 mg Oral Daily   QUEtiapine  200 mg Oral QHS   rosuvastatin  20 mg Oral Daily   sodium chloride flush  10-40 mL Intracatheter Q12H   thiamine  100 mg Oral Daily   traMADol  50 mg Oral Q6H   Continuous Infusions:   ceFAZolin (ANCEF) IV 2 g (05/25/22 0515)   methocarbamol (ROBAXIN) IV     vancomycin 1,000 mg (05/25/22 0854)   PRN Meds:.acetaminophen, ALPRAZolam, bisacodyl, bisoprolol, diphenhydrAMINE, HYDROmorphone (DILAUDID) injection, methocarbamol **OR** methocarbamol (ROBAXIN) IV, metoCLOPramide **OR** metoCLOPramide (REGLAN) injection, ondansetron **OR** ondansetron (ZOFRAN) IV, oxyCODONE, oxyCODONE, polyethylene glycol, sodium chloride flush  SUBJECTIVE:   24 hour events:  No acute events  She is hoping to go home today She is afebrile She has a PICC line in place  Review  of Systems  All other systems reviewed and are negative.    OBJECTIVE:   Blood pressure 134/90, pulse 78, temperature 97.9 F (36.6 C), temperature source Oral, resp. rate 17, height '5\' 4"'  (1.626 m), weight 76.2 kg, SpO2 95 %. Body mass index is 28.84 kg/m.  Physical Exam Constitutional:      Appearance: Normal appearance.  HENT:     Head: Normocephalic and atraumatic.  Eyes:     Extraocular Movements: Extraocular movements intact.     Conjunctiva/sclera: Conjunctivae normal.  Pulmonary:     Effort: Pulmonary effort is normal. No respiratory distress.  Abdominal:     General: There is no distension.     Palpations: Abdomen is soft.  Musculoskeletal:      Cervical back: Normal range of motion and neck supple.     Comments: Right ankle is wrapped. PICC line is in place.   Skin:    General: Skin is warm and dry.     Findings: No rash.  Neurological:     General: No focal deficit present.     Mental Status: She is alert and oriented to person, place, and time.  Psychiatric:        Mood and Affect: Mood normal.        Behavior: Behavior normal.     Lab Results: Lab Results  Component Value Date   WBC 5.7 05/25/2022   HGB 9.8 (L) 05/25/2022   HCT 30.5 (L) 05/25/2022   MCV 101.0 (H) 05/25/2022   PLT 212 05/25/2022    Lab Results  Component Value Date   NA 135 05/24/2022   K 3.8 05/24/2022   CO2 28 05/24/2022   GLUCOSE 375 (H) 05/24/2022   BUN 16 05/24/2022   CREATININE 0.74 05/24/2022   CALCIUM 8.9 05/24/2022   GFRNONAA >60 05/24/2022   GFRAA >60 05/02/2020    Lab Results  Component Value Date   ALT 21 05/24/2022   AST 13 (L) 05/24/2022   ALKPHOS 95 05/24/2022   BILITOT 0.4 05/24/2022       Component Value Date/Time   CRP 12.3 (H) 05/24/2022 0205       Component Value Date/Time   ESRSEDRATE 60 (H) 05/24/2022 0205     I have reviewed the micro and lab results in Epic.  Imaging: [place] EKG SITE RITE  Result Date: 05/23/2022 If Site Rite image not attached, placement could not be confirmed due to current cardiac rhythm.  Inpatient: VAS [place] PAD ABI  Result Date: 05/24/2022  LOWER EXTREMITY DOPPLER STUDY Patient Name:  KYIESHA MILLWARD  Date of Exam:   05/24/2022 Medical Rec #: [id number]      Accession #:    [id number] Date of Birth: 02/18/1961       Patient Gender: F Patient Age:   28 years Exam Location:  [facility] Procedure:      VAS [place] ABI WITH/WO TBI Referring Phys: Merlene Pulling --------------------------------------------------------------------------------  Indications: Peripheral artery disease. High Risk Factors: Hypertension, hyperlipidemia, Diabetes, current smoker.  Comparison Study: No prior study  Performing Technologist: Maudry Mayhew MHA, RVT, RDCS, RDMS  Examination Guidelines: A complete evaluation includes at minimum, Doppler waveform signals and systolic blood pressure reading at the level of bilateral brachial, anterior tibial, and posterior tibial arteries, when vessel segments are accessible. Bilateral testing is considered an integral part of a complete examination. Photoelectric Plethysmograph (PPG) waveforms and toe systolic pressure readings are included as required and additional duplex testing as needed. Limited examinations for reoccurring indications  may be performed as noted.  ABI Findings: +---------+------------------+-----+---------+---------+ Right    Rt Pressure (mmHg)IndexWaveform Comment   +---------+------------------+-----+---------+---------+ Brachial                                 PICC line +---------+------------------+-----+---------+---------+ PTA      180               1.34 triphasic          +---------+------------------+-----+---------+---------+ DP       163               1.22 biphasic           +---------+------------------+-----+---------+---------+ Great Toe121               0.90                    +---------+------------------+-----+---------+---------+ +---------+------------------+-----+---------+-------+ Left     Lt Pressure (mmHg)IndexWaveform Comment +---------+------------------+-----+---------+-------+ Brachial 134                                     +---------+------------------+-----+---------+-------+ PTA      107               0.80 triphasic        +---------+------------------+-----+---------+-------+ DP       140               1.04 triphasic        +---------+------------------+-----+---------+-------+ Great Toe102               0.76                  +---------+------------------+-----+---------+-------+ +-------+-----------+-----------+------------+------------+ ABI/TBIToday's ABIToday's  TBIPrevious ABIPrevious TBI +-------+-----------+-----------+------------+------------+ Right  1.34       0.90                                +-------+-----------+-----------+------------+------------+ Left   1.04       0.76                                +-------+-----------+-----------+------------+------------+  Summary: Right: Resting right ankle-brachial index is within normal range. No evidence of significant right lower extremity arterial disease. The right toe-brachial index is normal. Left: Resting left ankle-brachial index is within normal range. No evidence of significant left lower extremity arterial disease. The left toe-brachial index is normal. Guidlines:  Patients with an ABI >1.2 or <0.9 have a high risk of peripheral atherosclerosis. Patients with asymptomatic peripheral atherosclerosis can be managed without further referral, at the discretion of the ordering provider. Guidelines for medical therapy include the following: - Patients with asymptomatic peripheral atherosclerosis can be managed without further referral, at the discretion of the ordering provider. The following therapies are recommendations from the Society of Vascular Surgery for patients with peripheral arterial disease: _ Multidisciplinary comprehensive smoking cessation interventions. _ Blood glucose control with goal A1c < 7%. _ Blood pressure control, < 140/90 mmHg. _ Statin therapy, goal LDL-C at least <100 mg/dL. _ Supervised exercise program (30-60 min of walking, three times a week) for patients with intermittent claudication. _ Aspirin 58m by mouth daily  *See table(s) above for measurements and observations.     Preliminary  Imaging independently reviewed in Epic.    [facility] [phone] pager 05/25/2022, 10:12 AM  I have personally spent 50 minutes involved in face-to-face and non-face-to-face activities for this  patient on the day of the visit. Professional time spent includes the following activities: Preparing to see the patient (review of tests), Obtaining and/or reviewing separately obtained history (admission/discharge record), Performing a medically appropriate examination and/or evaluation , Ordering medications/tests/procedures, referring and communicating with other health care professionals, Documenting clinical information in the EMR, Independently interpreting results (not separately reported), Communicating results to the patient/family/caregiver, Counseling and educating the patient/family/caregiver and Care coordination (not separately reported).

## 2022-05-25 NOTE — Progress Notes (Signed)
PHARMACY CONSULT NOTE FOR:  OUTPATIENT  PARENTERAL ANTIBIOTIC THERAPY (OPAT)  Indication: Osteomyelitis Regimen: Cefazolin 2g IV q 8h End date: 07/04/2022  IV antibiotic discharge orders are pended. To discharging provider:  please sign these orders via discharge navigator,  Select New Orders & click on the button choice - Manage This Unsigned Work.     Thank you for involving pharmacy in this patient's care.  Elita Quick, PharmD PGY1 Ambulatory Care Pharmacy Resident 05/25/2022 10:52 AM  **Pharmacist phone directory can be found on [email or web address] listed under [place]**

## 2022-05-25 NOTE — Progress Notes (Signed)
Subjective: 2 Days Post-Op s/p Procedure(s): INCISION AND DRAINAGE ABSCESS, RIGHT ANKLE HARDWARE REMOVAL   Patient is alert, oriented. Patient reports pain was worse overnight, but felt like she did a lot yesterday in terms of walking around the room and working with PT. Denies chest pain, SOB, Calf pain. No nausea/vomiting. No other complaints. Ready to discharge home today.    Objective:  PE: VITALS:   Vitals:   05/24/22 1557 05/24/22 2044 05/25/22 0413 05/25/22 0756  BP: (!) 137/101 121/83 118/86 134/90  Pulse: 84 74 70 78  Resp: 18 18 18 17   Temp: 97.9 F (36.6 C) 98.2 F (36.8 C) 98 F (36.7 C) 97.9 F (36.6 C)  TempSrc:  Oral Oral Oral  SpO2: 95% 96% 97% 95%  Weight:      Height:       General: sitting up in chair, in no acute distress.  Resp: normal respiratory effort MSK: RLE dressing intact and CDI. Able to flex and extend all toes. Distal sensation intact. Cap refill intact. 2+ DP pulse. Walking in boot without pain.    LABS  Results for orders placed or performed during the hospital encounter of 05/23/22 (from the past 24 hour(s))  Glucose, capillary     Status: Abnormal   Collection Time: 05/24/22  6:45 PM  Result Value Ref Range   Glucose-Capillary 253 (H) 70 - 99 mg/dL  Glucose, capillary     Status: None   Collection Time: 05/24/22 11:10 PM  Result Value Ref Range   Glucose-Capillary 95 70 - 99 mg/dL  CBC     Status: Abnormal   Collection Time: 05/25/22  3:33 AM  Result Value Ref Range   WBC 5.7 4.0 - 10.5 K/uL   RBC 3.02 (L) 3.87 - 5.11 MIL/uL   Hemoglobin 9.8 (L) 12.0 - 15.0 g/dL   HCT 09.830.5 (L) 11.936.0 - 14.746.0 %   MCV 101.0 (H) 80.0 - 100.0 fL   MCH 32.5 26.0 - 34.0 pg   MCHC 32.1 30.0 - 36.0 g/dL   RDW 82.913.5 56.211.5 - 13.015.5 %   Platelets 212 150 - 400 K/uL   nRBC 0.0 0.0 - 0.2 %  Glucose, capillary     Status: Abnormal   Collection Time: 05/25/22  8:39 AM  Result Value Ref Range   Glucose-Capillary 187 (H) 70 - 99 mg/dL    US EKG SITE  RITE  Result Date: 05/23/2022 If Site Rite image not attached, placement could not be confirmed due to current cardiac rhythm.  Inpatient: VAS US PAD ABI  Result Date: 05/25/2022  LOWER EXTREMITY DOPPLER STUDY Patient Name:  Brittney Cooper  Date of Exam:   05/24/2022 Medical Rec #: [id number]      Accession #:    [id number] Date of Birth: 06/25/1961       Patient Gender: F Patient Age:   [age] years [facility] Procedure:      VAS US ABI WITH/WO TBI Referring Phys: Janine OresBLAINE Shaneya Taketa --------------------------------------------------------------------------------  Indications: Peripheral artery disease. High Risk Factors: Hypertension, hyperlipidemia, Diabetes, current smoker.  Comparison Study: No prior study Performing Technologist: Gertie FeySimonetti, Michelle MHA, RVT, RDCS, RDMS  Examination Guidelines: A complete evaluation includes at minimum, Doppler waveform signals and systolic blood pressure reading at the level of bilateral brachial, anterior tibial, and posterior tibial arteries, when vessel segments are accessible. Bilateral testing is considered an integral part of a complete examination. Photoelectric Plethysmograph (PPG) waveforms and toe systolic pressure readings are  included as required and additional duplex testing as needed. Limited examinations for reoccurring indications may be performed as noted.  ABI Findings: +---------+------------------+-----+---------+---------+ Right    Rt Pressure (mmHg)IndexWaveform Comment   +---------+------------------+-----+---------+---------+ Brachial                                 PICC line +---------+------------------+-----+---------+---------+ PTA      180               1.34 triphasic          +---------+------------------+-----+---------+---------+ DP       163               1.22 biphasic           +---------+------------------+-----+---------+---------+ Great Toe121               0.90                     +---------+------------------+-----+---------+---------+ +---------+------------------+-----+---------+-------+ Left     Lt Pressure (mmHg)IndexWaveform Comment +---------+------------------+-----+---------+-------+ Brachial 134                                     +---------+------------------+-----+---------+-------+ PTA      107               0.80 triphasic        +---------+------------------+-----+---------+-------+ DP       140               1.04 triphasic        +---------+------------------+-----+---------+-------+ Great Toe102               0.76                  +---------+------------------+-----+---------+-------+ +-------+-----------+-----------+------------+------------+ ABI/TBIToday's ABIToday's TBIPrevious ABIPrevious TBI +-------+-----------+-----------+------------+------------+ Right  1.34       0.90                                +-------+-----------+-----------+------------+------------+ Left   1.04       0.76                                +-------+-----------+-----------+------------+------------+  Summary: Right: Resting right ankle-brachial index is within normal range. No evidence of significant right lower extremity arterial disease. The right toe-brachial index is normal. Left: Resting left ankle-brachial index is within normal range. No evidence of significant left lower extremity arterial disease. The left toe-brachial index is normal. Guidlines:  Patients with an ABI >1.2 or <0.9 have a high risk of peripheral atherosclerosis. Patients with asymptomatic peripheral atherosclerosis can be managed without further referral, at the discretion of the ordering provider. Guidelines for medical therapy include the following: - Patients with asymptomatic peripheral atherosclerosis can be managed without further referral, at the discretion of the ordering provider. The following therapies are recommendations from the Society of Vascular Surgery for  patients with peripheral arterial disease: _ Multidisciplinary comprehensive smoking cessation interventions. _ Blood glucose control with goal A1c < 7%. _ Blood pressure control, < 140/90 mmHg. _ Statin therapy, goal LDL-C at least <100 mg/dL. _ Supervised exercise program (30-60 min of walking, three times a week) for patients with intermittent claudication. _ Aspirin 81mg  by mouth daily  *See  table(s) above for measurements and observations.  Electronically signed by Waverly Ferrari MD on 05/25/2022 at 10:52:10 AM.    Final     Assessment/Plan: Infected right ankle hardware - s/p right ankle I&D with hardware removal 05/23/22 Weightbearing: WBAT RLE Insicional and dressing care: Reinforce dressings as needed ID: Appreciate ID consult, Has been on vancomycin and ceftriaxone, ceftriaxone changed to ancef yesterday. Operative cultures showing MSSA, changed to cefazolin only which she will discharge on. PICC line has been placed.  VTE prophylaxis: Aspirin 81mg  BID, early ambulation Pain control: continue current regimen - vascular ultrasound negative for peripheral vascular disease  Follow - up plan: 1 week with Dr.  Dispo: will discharge home today  Contact information:   Eulah Pont, PA-C Weekdays 8-5  After hours and holidays please check [email or web address] for group call information for [facility]  08-30-2006 05/25/2022, 11:16 AM

## 2022-05-25 NOTE — TOC Progression Note (Addendum)
Transition of Care [facility]) - Progression Note    Patient Details  Name: Brittney Cooper MRN: [id number] Date of Birth: Nov 18, 1961  Transition of Care [facility]) CM/SW Contact  Nadene Rubins Adria Devon, RN Phone Number: 05/25/2022, 12:09 PM  Clinical Narrative:     1200 Patient would like a knee scooter. Messaged PA for order.   Pam with [employer] coming to do teaching with patient. [facility] aware discharge today   1311 Patient wanting to know when IV education will be done , her sister is on the way to pick her up. Pam is leaving [facility] now to come see patient. Patient will need 2 pm dose prior to discharge. Messaged RN   1320 Called Dr Dion Saucier office regarding knee scooter. Dr Dion Saucier and Thermon Leyland both in surgery today, left message with Nettie Elm . Patient aware and if we don't hear back prior to discharge she will follow up with Dr Dion Saucier office.   Expected Discharge Plan: Home w Home Health Services Barriers to Discharge: Continued Medical Work up  Expected Discharge Plan and Services Expected Discharge Plan: Home w Home Health Services   Discharge Planning Services: CM Consult Post Acute Care Choice: Home Health Living arrangements for the past 2 months: Single Family Home Expected Discharge Date: 05/25/22               DME Arranged: Dan Humphreys rolling DME Agency: [employer] Date DME Agency Contacted: 05/24/22 Time DME Agency Contacted: 1013 Representative spoke with at DME Agency: jasmine HH Arranged: PT, OT, RN HH Agency: [facility] Date [facility] Agency Contacted: 05/24/22 Time HH Agency Contacted: 1015 Representative spoke with at [facility] Agency: Cindie checking to see if she can accept   Social Determinants of Health (SDOH) Interventions    Readmission Risk Interventions     View : No data to display.

## 2022-05-28 LAB — AEROBIC/ANAEROBIC CULTURE W GRAM STAIN (SURGICAL/DEEP WOUND)

## 2022-06-08 ENCOUNTER — Ambulatory Visit: Payer: Medicare Other | Admitting: Internal Medicine

## 2022-06-08 DIAGNOSIS — Z452 Encounter for adjustment and management of vascular access device: Secondary | ICD-10-CM | POA: Insufficient documentation

## 2022-06-08 DIAGNOSIS — A4901 Methicillin susceptible Staphylococcus aureus infection, unspecified site: Secondary | ICD-10-CM | POA: Insufficient documentation

## 2022-06-08 NOTE — Progress Notes (Deleted)
[facility]  CHIEF COMPLAINT:    Follow up for osteofixation infection  SUBJECTIVE:    Brittney Cooper is a 61 y.o. female with PMHx as below who presents to the clinic for follow up osteofixation infection.   Patient has a history of right ankle fracture s/p ORIF approximately 5-6 years ago by Dr Percell Miller.  She recently presented to the orthopedic office last month with a draining, painful lateral wound overlying the fibula.  She was taken to the OR on 05/23/22 by Dr Mardelle Matte for debridement where she was found to have an abscess overlying the hardware with a small element of distal fibular OM.  She had all hardware removed at the time of her surgery.  Her operative cultures grew MSSA and she has been on cefazolin 2gm every 8 hours via PICC line since that time.  She will complete 6 weeks of IV antibiotics on 07/04/22.  She has had no issues with her PICC line to date.  Recent OPAT labs from 6/5 show that her ESR is 14 and CRP 17 (scale 0-10).   She reports that she had follow up with orthopedic surgery on *** and that ***.    Please see A&P for the details of today's visit and status of the patient's medical problems.   Patient's Medications  New Prescriptions   No medications on file  Previous Medications   ALPRAZOLAM (XANAX) 0.5 MG TABLET    Take 0.25-0.5 mg by mouth 3 (three) times daily as needed for anxiety.   AMANTADINE HCL 100 MG TABLET    Take 50-100 mg by mouth 2 (two) times daily.   AMPHETAMINE-DEXTROAMPHETAMINE (ADDERALL) 30 MG TABLET    Take 30 mg by mouth 2 (two) times daily. Does not take 2nd dose after 3pm   ASPIRIN EC 81 MG TABLET    Take 1 tablet (81 mg total) by mouth daily. For heart health   BENZTROPINE (COGENTIN) 1 MG TABLET    Take 0.5-1 mg by mouth 2 (two) times daily.   BISOPROLOL (ZEBETA) 5 MG TABLET    TAKE 1 TABLET BY MOUTH DAILY   CALCIUM CARBONATE (OSCAL) 1500 (600 CA) MG TABS TABLET    Take by mouth 2 (two) times daily with a  meal.   CEFAZOLIN (ANCEF) IVPB    Inject 2 g into the vein every 8 (eight) hours. Indication:  Osteomyelitis First Dose: Yes Last Day of Therapy:  07/04/2022 Labs - Once weekly:  CBC/D and BMP, Labs - Every other week:  ESR and CRP Method of administration: IV Push Method of administration may be changed at the discretion of home infusion pharmacist based upon assessment of the patient and/or caregiver's ability to self-administer the medication ordered.   DIVALPROEX (DEPAKOTE ER) 250 MG 24 HR TABLET    Take 250 mg by mouth every evening.   DOXAZOSIN (CARDURA) 1 MG TABLET    TAKE 1 TABLET BY MOUTH AT BEDTIME   DULOXETINE (CYMBALTA) 60 MG CAPSULE    Take 60 mg by mouth in the morning.   FENOFIBRATE (TRICOR) 145 MG TABLET    Take 145 mg by mouth daily.   FLECAINIDE (TAMBOCOR) 50 MG TABLET    TAKE ONE (1) TABLET BY MOUTH TWO (2) TIMES DAILY   FOLIC ACID (FOLVITE) 1 MG TABLET    Take 1 mg by mouth in the morning.   GABAPENTIN (NEURONTIN) 300 MG CAPSULE    Take 300 mg by mouth  at bedtime.   GLIPIZIDE (GLUCOTROL XL) 5 MG 24 HR TABLET    Take 5 mg by mouth daily.   LOSARTAN (COZAAR) 25 MG TABLET    TAKE ONE (1) TABLET BY MOUTH EACH DAY   MAGNESIUM GLUCONATE (MAGONATE) 500 MG TABLET    Take 500 mg by mouth daily.   METFORMIN (GLUCOPHAGE) 1000 MG TABLET    Take 1,000 mg by mouth 2 (two) times daily.   MIDODRINE (PROAMATINE) 2.5 MG TABLET    Take 1 tablet (2.5 mg total) by mouth 2 (two) times daily as needed.   MUPIROCIN OINTMENT (BACTROBAN) 2 %    Apply topically 2 (two) times daily.   NAPROXEN SODIUM (ALEVE) 220 MG TABLET    Take 220 mg by mouth 2 (two) times daily as needed.   OMEPRAZOLE (PRILOSEC) 40 MG CAPSULE    Take 40 mg by mouth 2 (two) times daily.   ONDANSETRON (ZOFRAN) 4 MG TABLET    Take 1 tablet (4 mg total) by mouth every 8 (eight) hours as needed for nausea or vomiting.   OXYCODONE HCL 10 MG TABS    Take 1 tablet (10 mg total) by mouth 3 (three) times daily as needed (severe pain).    QUETIAPINE (SEROQUEL) 100 MG TABLET    Take 200 mg by mouth at bedtime.    ROPINIROLE (REQUIP) 0.5 MG TABLET    Take 0.5 mg by mouth at bedtime.   ROSUVASTATIN (CRESTOR) 20 MG TABLET    Take 20 mg by mouth daily.  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   Allergy    Anxiety    Arthritis    Bipolar disorder ([place])    Cardiac arrest ([place]) 06-2015, 08-2016   x3- pt has pacemaker    Chronic pain    back   Complication of anesthesia    woke up during 2 surgical procedures   Depression    Diabetes mellitus without complication ([place])    Type 2   Fatty liver    Fracture of lateral malleolus of right fibula    GERD (gastroesophageal reflux disease)    Hepatomegaly    Hyperlipidemia    Hypertension    Neuromuscular disorder (HCC)    Dystonia   OCD (obsessive compulsive disorder)    Pancreatic pseudocyst    Panic attack    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, 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   Syncope    neurocardiogenic   Third degree heart block ([place])     Social History   Tobacco Use   Smoking status: Every Day    Packs/day: 1.00    Years: 15.00    Total pack years: 15.00    Types: Cigarettes   Smokeless tobacco: Never   Tobacco comments:    1/2 -1 ppd  Vaping Use   Vaping Use: Former   Substances: Nicotine  Substance Use Topics   Alcohol use: Yes    Comment: social   Drug use: No    Family History  Problem Relation Age of Onset   Stroke Mother    Kidney disease Mother    Colon polyps Mother  Hypertension Mother    Kidney failure Mother    Lung cancer Father        stage IV    Colon polyps Father    Hypertension Father    Stomach  cancer Maternal Grandfather    Colon cancer Paternal Grandfather    Hypertension Sister    Hyperlipidemia Sister    Hypertension Brother    Hyperlipidemia Brother    Rectal cancer Neg Hx    Esophageal cancer Neg Hx    Pancreatic cancer Neg Hx     Allergies  Allergen Reactions   Codeine Nausea And Vomiting   Demerol [Meperidine] Other (See Comments)    Hallucinations    Jardiance [Empagliflozin] Other (See Comments)    Yeast infections   Lopressor [Metoprolol] Other (See Comments)    nightmares    ROS   OBJECTIVE:    There were no vitals filed for this visit. There is no height or weight on file to calculate BMI.  Physical Exam   Labs and Microbiology:    Latest Ref Rng & Units 05/25/2022    3:33 AM 05/24/2022    2:05 AM 05/23/2022    7:30 AM  CBC  WBC 4.0 - 10.5 K/uL 5.7  7.5  9.7   Hemoglobin 12.0 - 15.0 g/dL 9.8  10.6  12.8   Hematocrit 36.0 - 46.0 % 30.5  31.1  35.9   Platelets 150 - 400 K/uL 212  217  228       Latest Ref Rng & Units 05/24/2022    2:05 AM 05/23/2022    7:30 AM 12/01/2020    4:48 AM  CMP  Glucose 70 - 99 mg/dL 375  176  136   BUN 6 - 20 mg/dL _0 Creatinine 0.44 - 1.00 mg/dL 0.74  0.65  0.66   Sodium 135 - 145 mmol/L 135  136  140   Potassium 3.5 - 5.1 mmol/L 3.8  3.4  3.7   Chloride 98 - 111 mmol/L 101  100  105   CO2 22 - 32 mmol/L _1 Calcium 8.9 - 10.3 mg/dL 8.9  9.2  8.6   Total Protein 6.5 - 8.1 g/dL 5.6  6.5  5.9   Total Bilirubin 0.3 - 1.2 mg/dL 0.4  1.1  0.8   Alkaline Phos 38 - 126 U/L 95  91  88   AST 15 - 41 U/L _2 ALT 0 - 44 U/L _3 ASSESSMENT & PLAN:    No problem-specific Assessment & Plan notes found for this encounter.   No orders of the defined types were placed in this encounter.    There are no diagnoses linked to this encounter.  She will complete 6 weeks of IV Cefazolin for osteofixation infection  and abscess of the right ankle s/p removal of hardware,  debridement, and I&D on 05/23/22 with operative cultures that grew MSSA.  She will complete antibiotics on 07/04/22 at which time PICC line will be removed.  Will have her follow up approximately 2 weeks after finishing antibiotics to ensure she is doing okay.  A1c in the hospital was 8.3.  Discussed importance of glycemic control.   PICC line in place and no issues.  Will have this removed at end of treatment.    [facility] Highland for Infectious Disease [facility]  06/08/2022, 1:26 PM

## 2022-06-10 ENCOUNTER — Ambulatory Visit: Payer: Medicare Other | Admitting: Internal Medicine

## 2022-06-14 ENCOUNTER — Encounter: Payer: Self-pay | Admitting: Internal Medicine

## 2022-06-22 ENCOUNTER — Encounter: Payer: Self-pay | Admitting: Internal Medicine

## 2022-06-28 ENCOUNTER — Encounter: Payer: Self-pay | Admitting: Internal Medicine

## 2022-07-02 ENCOUNTER — Ambulatory Visit (INDEPENDENT_AMBULATORY_CARE_PROVIDER_SITE_OTHER): Payer: Medicare Other | Admitting: Internal Medicine

## 2022-07-02 ENCOUNTER — Telehealth: Payer: Self-pay

## 2022-07-02 ENCOUNTER — Encounter: Payer: Self-pay | Admitting: Internal Medicine

## 2022-07-02 ENCOUNTER — Other Ambulatory Visit: Payer: Self-pay

## 2022-07-02 VITALS — BP 135/94 | HR 98 | Temp 98.0°F | Wt 167.0 lb

## 2022-07-02 DIAGNOSIS — T847XXD Infection and inflammatory reaction due to other internal orthopedic prosthetic devices, implants and grafts, subsequent encounter: Secondary | ICD-10-CM

## 2022-07-02 DIAGNOSIS — Z452 Encounter for adjustment and management of vascular access device: Secondary | ICD-10-CM | POA: Diagnosis not present

## 2022-07-02 DIAGNOSIS — A4901 Methicillin susceptible Staphylococcus aureus infection, unspecified site: Secondary | ICD-10-CM

## 2022-07-02 DIAGNOSIS — M86061 Acute hematogenous osteomyelitis, right tibia and fibula: Secondary | ICD-10-CM | POA: Diagnosis not present

## 2022-07-02 NOTE — Telephone Encounter (Signed)
Got it - thanks Cece!

## 2022-07-02 NOTE — Telephone Encounter (Signed)
Per Dr. Earlene Plater called [facility] with orders to extend Iv Cefazolin x2 weeks. Spoke with Amy, Pharmacist who was able to take verbal order. New end date is 7/23. Orders repeated and confirmed before ending call. Juanita Laster, RMA

## 2022-07-02 NOTE — Progress Notes (Signed)
Labs drawn via PICC line per Dr. Wallace . Line flushed with 10 mL normal saline and clamped. Patient tolerated procedure well.   Diora Bellizzi D Jylan Loeza, RN   

## 2022-07-02 NOTE — Assessment & Plan Note (Signed)
She is currently on IV Cefazolin for osteofixation infection and abscess of the right ankle s/p removal of hardware, debridement, and I&D on 05/23/22 with operative cultures that grew MSSA.  She was supposed to complete 6 weeks of antibiotics on 07/04/22, however, there is new concern today for ongoing infection given drainage, swelling, and tenderness.  Will repeat labs today.  She is to see orthopedic surgery again next week and possibly going back to the OR on Thursday 07/08/22 for repeat debridement.  I will extend her antibiotics an additional 2 weeks through 07/18/22 and follow up with her again in about 10 days.

## 2022-07-02 NOTE — Progress Notes (Signed)
[facility]  CHIEF COMPLAINT:    Follow up for osteofixation infection  SUBJECTIVE:    Brittney Cooper is a 61 y.o. female with PMHx as below who presents to the clinic for follow up osteofixation infection.   Patient has a history of right ankle fracture s/p ORIF approximately 5-6 years ago by Dr Percell Miller.  She recently presented to the orthopedic office last month with a draining, painful lateral wound overlying the fibula.  She was taken to the OR on 05/23/22 by Dr Mardelle Matte for debridement where she was found to have an abscess overlying the hardware with a small element of distal fibular OM.  She had all hardware removed at the time of her surgery.  Her operative cultures grew MSSA and she has been on cefazolin 2gm every 8 hours via PICC line since that time.  She will tentatively complete 6 weeks of IV antibiotics on 07/04/22.  She has had no issues with her PICC line to date.  Recent OPAT labs from 6/27 show that her ESR is 12 and CRP 3 (scale 0-10).   She reports that she had follow up with orthopedic surgery last week where sutures were removed.  She then started having some drainage from her incision early this week.  She saw Dr Percell Miller again this week who told her she may need to go back to the OR next week for a repeat washout.  She has no fevers or chills.  She does have some pain in her ankle along with drainage.  She has been adherent with antibiotic regimen for the most part with exception of a couple doses over hte 6 weeks.  She had to take her kid to orientation at [place] a couple weeks ago and states she did a lot of walking at that time.     Please see A&P for the details of today's visit and status of the patient's medical problems.   Patient's Medications  New Prescriptions   No medications on file  Previous Medications   ALPRAZOLAM (XANAX) 0.5 MG TABLET    Take 0.25-0.5 mg by mouth 3 (three) times daily as needed for anxiety.    AMANTADINE HCL 100 MG TABLET    Take 50-100 mg by mouth 2 (two) times daily.   AMPHETAMINE-DEXTROAMPHETAMINE (ADDERALL) 30 MG TABLET    Take 30 mg by mouth 2 (two) times daily. Does not take 2nd dose after 3pm   ASPIRIN EC 81 MG TABLET    Take 1 tablet (81 mg total) by mouth daily. For heart health   BENZTROPINE (COGENTIN) 1 MG TABLET    Take 0.5-1 mg by mouth 2 (two) times daily.   BISOPROLOL (ZEBETA) 5 MG TABLET    TAKE 1 TABLET BY MOUTH DAILY   CALCIUM CARBONATE (OSCAL) 1500 (600 CA) MG TABS TABLET    Take by mouth 2 (two) times daily with a meal.   CEFAZOLIN (ANCEF) IVPB    Inject 2 g into the vein every 8 (eight) hours. Indication:  Osteomyelitis First Dose: Yes Last Day of Therapy:  07/04/2022 Labs - Once weekly:  CBC/D and BMP, Labs - Every other week:  ESR and CRP Method of administration: IV Push Method of administration may be changed at the discretion of home infusion pharmacist based upon assessment of the patient and/or caregiver's ability to self-administer the medication ordered.   DIVALPROEX (DEPAKOTE ER) 250 MG 24 HR TABLET    Take 250  mg by mouth every evening.   DOXAZOSIN (CARDURA) 1 MG TABLET    TAKE 1 TABLET BY MOUTH AT BEDTIME   DULOXETINE (CYMBALTA) 60 MG CAPSULE    Take 60 mg by mouth in the morning.   FENOFIBRATE (TRICOR) 145 MG TABLET    Take 145 mg by mouth daily.   FLECAINIDE (TAMBOCOR) 50 MG TABLET    TAKE ONE (1) TABLET BY MOUTH TWO (2) TIMES DAILY   FOLIC ACID (FOLVITE) 1 MG TABLET    Take 1 mg by mouth in the morning.   GABAPENTIN (NEURONTIN) 300 MG CAPSULE    Take 300 mg by mouth at bedtime.   GLIPIZIDE (GLUCOTROL XL) 5 MG 24 HR TABLET    Take 5 mg by mouth daily.   LOSARTAN (COZAAR) 25 MG TABLET    TAKE ONE (1) TABLET BY MOUTH EACH DAY   MAGNESIUM GLUCONATE (MAGONATE) 500 MG TABLET    Take 500 mg by mouth daily.   METFORMIN (GLUCOPHAGE) 1000 MG TABLET    Take 1,000 mg by mouth 2 (two) times daily.   MIDODRINE (PROAMATINE) 2.5 MG TABLET    Take 1 tablet (2.5 mg  total) by mouth 2 (two) times daily as needed.   MUPIROCIN OINTMENT (BACTROBAN) 2 %    Apply topically 2 (two) times daily.   NAPROXEN SODIUM (ALEVE) 220 MG TABLET    Take 220 mg by mouth 2 (two) times daily as needed.   OMEPRAZOLE (PRILOSEC) 40 MG CAPSULE    Take 40 mg by mouth 2 (two) times daily.   ONDANSETRON (ZOFRAN) 4 MG TABLET    Take 1 tablet (4 mg total) by mouth every 8 (eight) hours as needed for nausea or vomiting.   OXYCODONE HCL 10 MG TABS    Take 1 tablet (10 mg total) by mouth 3 (three) times daily as needed (severe pain).   QUETIAPINE (SEROQUEL) 100 MG TABLET    Take 200 mg by mouth at bedtime.    ROPINIROLE (REQUIP) 0.5 MG TABLET    Take 0.5 mg by mouth at bedtime.   ROSUVASTATIN (CRESTOR) 20 MG TABLET    Take 20 mg by mouth daily.  Modified Medications   No medications on file  Discontinued Medications   No medications on file      Past Medical History:  Diagnosis Date   ADHD (attention deficit hyperactivity disorder)    AICD (automatic cardioverter/defibrillator) present    Biotroniks Pacemaker   Alcohol abuse    Alcoholic pancreatitis 41/63, 11/16, 7/17   Allergy    Anxiety    Arthritis    Bipolar disorder ([place])    Cardiac arrest ([place]) 06-2015, 08-2016   x3- pt has pacemaker    Chronic pain    back   Complication of anesthesia    woke up during 2 surgical procedures   Depression    Diabetes mellitus without complication ([place])    Type 2   Fatty liver    Fracture of lateral malleolus of right fibula    GERD (gastroesophageal reflux disease)    Hepatomegaly    Hyperlipidemia    Hypertension    Neuromuscular disorder (HCC)    Dystonia   OCD (obsessive compulsive disorder)    Pancreatic pseudocyst    Panic attack    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, working on OB floor as a nurse   Syncope    neurocardiogenic   Third degree heart block ([place])  Social History   Tobacco Use   Smoking status: Every Day     Packs/day: 1.00    Years: 15.00    Total pack years: 15.00    Types: Cigarettes   Smokeless tobacco: Never   Tobacco comments:    1/2 -1 ppd  Vaping Use   Vaping Use: Former   Substances: Nicotine  Substance Use Topics   Alcohol use: Yes    Comment: social   Drug use: No    Family History  Problem Relation Age of Onset   Stroke Mother    Kidney disease Mother    Colon polyps Mother    Hypertension Mother    Kidney failure Mother    Lung cancer Father        stage IV    Colon polyps Father    Hypertension Father    Stomach cancer Maternal Grandfather    Colon cancer Paternal Grandfather    Hypertension Sister    Hyperlipidemia Sister    Hypertension Brother    Hyperlipidemia Brother    Rectal cancer Neg Hx    Esophageal cancer Neg Hx    Pancreatic cancer Neg Hx     Allergies  Allergen Reactions   Codeine Nausea And Vomiting   Demerol [Meperidine] Other (See Comments)    Hallucinations    Jardiance [Empagliflozin] Other (See Comments)    Yeast infections   Lopressor [Metoprolol] Other (See Comments)    nightmares    Review of Systems  Constitutional: Negative.   Respiratory: Negative.    Cardiovascular: Negative.   Gastrointestinal: Negative.   Musculoskeletal:  Positive for joint pain.  All other systems reviewed and are negative.    OBJECTIVE:    Vitals:   07/02/22 1339  BP: (!) 135/94  Pulse: 98  Temp: 98 F (36.7 C)  TempSrc: Oral  SpO2: 96%  Weight: 167 lb (75.8 kg)   Body mass index is 28.67 kg/m.  Physical Exam Constitutional:      General: She is not in acute distress.    Appearance: Normal appearance.  Eyes:     Extraocular Movements: Extraocular movements intact.     Conjunctiva/sclera: Conjunctivae normal.  Pulmonary:     Effort: Pulmonary effort is normal. No respiratory distress.  Abdominal:     General: There is no distension.     Palpations: Abdomen is soft.  Musculoskeletal:        General: Normal range of motion.      Comments: PICC line in place. Right ankle insicion with minimal drainage.  There is some swelling and maybe a little fluctuance.  She is tender over the lateral malleolus.   Skin:    General: Skin is warm and dry.     Findings: No rash.  Neurological:     General: No focal deficit present.     Mental Status: She is alert and oriented to person, place, and time.  Psychiatric:        Mood and Affect: Mood normal.        Behavior: Behavior normal.      Labs and Microbiology:    Latest Ref Rng & Units 05/25/2022    3:33 AM 05/24/2022    2:05 AM 05/23/2022    7:30 AM  CBC  WBC 4.0 - 10.5 K/uL 5.7  7.5  9.7   Hemoglobin 12.0 - 15.0 g/dL 9.8  10.6  12.8   Hematocrit 36.0 - 46.0 % 30.5  31.1  35.9   Platelets 150 - 400  K/uL 212  217  228       Latest Ref Rng & Units 05/24/2022    2:05 AM 05/23/2022    7:30 AM 12/01/2020    4:48 AM  CMP  Glucose 70 - 99 mg/dL 375  176  136   BUN 6 - 20 mg/dL '16  16  9   ' Creatinine 0.44 - 1.00 mg/dL 0.74  0.65  0.66   Sodium 135 - 145 mmol/L 135  136  140   Potassium 3.5 - 5.1 mmol/L 3.8  3.4  3.7   Chloride 98 - 111 mmol/L 101  100  105   CO2 22 - 32 mmol/L '28  28  25   ' Calcium 8.9 - 10.3 mg/dL 8.9  9.2  8.6   Total Protein 6.5 - 8.1 g/dL 5.6  6.5  5.9   Total Bilirubin 0.3 - 1.2 mg/dL 0.4  1.1  0.8   Alkaline Phos 38 - 126 U/L 95  91  88   AST 15 - 41 U/L '13  16  22   ' ALT 0 - 44 U/L '21  23  25       ' ASSESSMENT & PLAN:    Peripherally inserted central catheter (PICC) in place PICC line in place and no issues.  Will have this in place while on IV antibiotics.   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  She was supposed to complete 6 weeks of antibiotics on 07/04/22, however, there is new concern today for ongoing infection given drainage,  swelling, and tenderness.  Will repeat labs today.  She is to see orthopedic surgery again next week and possibly going back to the OR on Thursday 07/08/22 for repeat debridement.  I will extend her antibiotics an additional 2 weeks through 07/18/22 and follow up with her again in about 10 days.   Orders Placed This Encounter  Procedures   CBC   Sedimentation rate   C-reactive protein          Raynelle Highland for Infectious Disease [facility] 07/02/2022, 2:01 PM

## 2022-07-02 NOTE — Assessment & Plan Note (Signed)
PICC line in place and no issues.  Will have this in place while on IV antibiotics.

## 2022-07-02 NOTE — Patient Instructions (Signed)
Thank you for coming to see me today. It was a pleasure seeing you.  To Do: Continue IV cefazolin 3 times per day I have extended the antibiotics for an additional 2 weeks pending another surgery Follow up with me in 7-10 days  If you have any questions or concerns, please do not hesitate to call the office at [phone].  Take Care,   Gwynn Burly

## 2022-07-03 LAB — CBC
HCT: 40.7 % (ref 35.0–45.0)
Hemoglobin: 14 g/dL (ref 11.7–15.5)
MCH: 33.1 pg — ABNORMAL HIGH (ref 27.0–33.0)
MCHC: 34.4 g/dL (ref 32.0–36.0)
MCV: 96.2 fL (ref 80.0–100.0)
MPV: 10.4 fL (ref 7.5–12.5)
Platelets: 284 10*3/uL (ref 140–400)
RBC: 4.23 10*6/uL (ref 3.80–5.10)
RDW: 12.7 % (ref 11.0–15.0)
WBC: 6.7 10*3/uL (ref 3.8–10.8)

## 2022-07-03 LAB — SEDIMENTATION RATE: Sed Rate: 6 mm/h (ref 0–30)

## 2022-07-03 LAB — C-REACTIVE PROTEIN: CRP: 2.5 mg/L (ref <8.0)

## 2022-07-07 ENCOUNTER — Telehealth: Payer: Self-pay | Admitting: *Deleted

## 2022-07-07 ENCOUNTER — Encounter (HOSPITAL_BASED_OUTPATIENT_CLINIC_OR_DEPARTMENT_OTHER): Payer: Self-pay | Admitting: Orthopedic Surgery

## 2022-07-07 ENCOUNTER — Other Ambulatory Visit: Payer: Self-pay

## 2022-07-07 NOTE — Progress Notes (Addendum)
Spoke w/ via phone for pre-op interview---pt Lab needs dos----  [facility] results------ COVID test -----patient states asymptomatic no test needed Arrive at -------815 am 07-08-2022 NPO after MN NO Solid Food.  Clear liquids from MN until---715 am Med rec completed Medications to take morning of surgery -----flecanide, midodrine prn, alprazolam prn, duloxetine, omeprazole Diabetic medication -----none day of surgery Patient instructed no nail polish to be worn day of surgery Patient instructed to bring photo id and insurance card day of surgery Patient aware to have Driver (ride ) / caregiver   sister artavia jeanlouis cell [phone]  for 24 hours after surgery  Patient Special Instructions -----none Pre-Op special Istructions -----none Patient verbalized understanding of instructions that were given at this phone interview. Patient denies shortness of breath, chest pain, fever, cough at this phone interview.   Reviewed patient history with dr singer, mda, pt meets [facility] guidelines per dr singer, mda  Pacemaker device orders requested for 07-08-2022 surgery epic inbasket note received cannot do device orders since pt not seen in device clinic in last year, remote device checks up to date, last remote device check 04-07-2022 are up to date per device clinic note sent   Cardiac clearance note michelle swyinyer  np 07-07-2022 epic Ekg 12-08-2021 epic

## 2022-07-07 NOTE — Telephone Encounter (Signed)
   Primary Cardiologist: Chilton Si, MD  Chart reviewed as part of pre-operative protocol coverage. Given past medical history and time since last visit, based on ACC/AHA guidelines, TALMA AGUILLARD would be at acceptable risk for the planned procedure without further cardiovascular testing.   Patient was advised that if he/she develops new symptoms prior to surgery to contact our office to arrange a follow-up appointment.  He verbalized understanding.  I will route this recommendation to the requesting party via Epic fax function and remove from pre-op pool.  Please call with questions.  Levi Aland, NP-C    07/07/2022, 3:50 PM [facility] [address] Office [phone] Fax [phone]

## 2022-07-07 NOTE — H&P (Signed)
PREOPERATIVE H&P  Chief Complaint: OSTEOMYELITIS RIGHT ANKLE  HPI: Brittney Cooper is a 61 y.o. female who presents with a diagnosis of OSTEOMYELITIS RIGHT ANKLE. Symptoms are rated as moderate to severe, and have been worsening.  This is significantly impairing activities of daily living.  She has elected for surgical management.   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   Allergy    Anxiety    Arthritis    Bipolar disorder (HCC) 2    Cardiac arrest (HCC) 06-2015, 08-2016   x3- pt has pacemaker    Chronic pain    back   Complication of anesthesia    woke up during 2 surgical procedure hydterectomy and colonscopy   Depression    Diabetes mellitus without complication (HCC)    Type 2   Esophageal mass 02/2019   has upper endo with [place] with biopsy   Fatty liver    Fracture of lateral malleolus of right fibula 06/2015   GERD (gastroesophageal reflux disease)    history ofHepatomegaly    Hyperlipidemia    Hypertension    Neurocardiogenic syncope    faints 2 x week   Neuromuscular disorder (HCC)    Dystonia   OCD (obsessive compulsive disorder)    OCD (obsessive compulsive disorder)    Osteoporosis    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   Pancreatic pseudocyst    Panic attack    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, working on OB floor as a nurse   Syncope    neurocardiogenic   Third degree heart block (HCC)    Past Surgical History:  Procedure Laterality Date   ABDOMINAL HYSTERECTOMY  2009   ANTERIOR AND POSTERIOR REPAIR  2009   BUNIONECTOMY Bilateral    CESAREAN SECTION     EP IMPLANTABLE DEVICE N/A 10/12/2016   Procedure: Biotronik Pacemaker Implant;  Surgeon: Will Jorja Loa, MD;  Location: [facility];  Service: Cardiovascular;  Laterality: N/A;   finfger surgery Left    left middle fingger   FINGER SURGERY      HARDWARE REMOVAL Right 05/23/2022   Procedure: HARDWARE REMOVAL;  Surgeon: Teryl Lucy, MD;  Location: [facility] OR;  Service: Orthopedics;  Laterality: Right;   INCISION AND DRAINAGE ABSCESS Right 05/23/2022   Procedure: INCISION AND DRAINAGE ABSCESS, RIGHT ANKLE;  Surgeon: Teryl Lucy, MD;  Location: [facility] OR;  Service: Orthopedics;  Laterality: Right;   LTCS     MYRINGOTOMY  1966   ORIF ANKLE FRACTURE Right 07/25/2015   Procedure: OPEN REDUCTION INTERNAL FIXATION (ORIF) RIGHT ANKLE ;  Surgeon: Sheral Apley, MD;  Location: [facility];  Service: Orthopedics;  Laterality: Right;   RHINOPLASTY     age 63   sub mandibular cyst removal from right side of tongue  2019   TONSILLECTOMY  1966   and adenoids removed   TUBAL LIGATION  2005   Social History   Socioeconomic History   Marital status: Divorced    Spouse name: Not on file   Number of children: Not on file   Years of education: Not on file   Highest education level: Not on file  Occupational History   Occupation: RN  Tobacco Use   Smoking status: Every Day    Packs/day: 1.00    Years: 15.00    Total pack years: 15.00  Types: Cigarettes   Smokeless tobacco: Never   Tobacco comments:    1/2 -1 ppd  Vaping Use   Vaping Use: Former   Substances: Nicotine  Substance and Sexual Activity   Alcohol use: Yes    Comment: social   Drug use: No   Sexual activity: Not Currently    Birth control/protection: Surgical, Abstinence    Comment: Hysterectomy  Other Topics Concern   Not on file  Social History Narrative   Not on file   Social Determinants of Health   Financial Resource Strain: Not on file  Food Insecurity: Not on file  Transportation Needs: Not on file  Physical Activity: Not on file  Stress: Not on file  Social Connections: Not on file   Family History  Problem Relation Age of Onset   Stroke Mother    Kidney disease Mother    Colon polyps Mother    Hypertension Mother    Kidney failure  Mother    Lung cancer Father        stage IV    Colon polyps Father    Hypertension Father    Stomach cancer Maternal Grandfather    Colon cancer Paternal Grandfather    Hypertension Sister    Hyperlipidemia Sister    Hypertension Brother    Hyperlipidemia Brother    Rectal cancer Neg Hx    Esophageal cancer Neg Hx    Pancreatic cancer Neg Hx    Allergies  Allergen Reactions   Codeine Nausea And Vomiting   Demerol [Meperidine] Other (See Comments)    Hallucinations    Jardiance [Empagliflozin] Other (See Comments)    Yeast infections   Lopressor [Metoprolol] Other (See Comments)    nightmares   Prior to Admission medications   Medication Sig Start Date End Date Taking? Authorizing Provider  Multiple Vitamins-Minerals (MULTIVITAMIN WITH MINERALS) tablet Take 2 tablets by mouth daily. WOMENS OVER 50 MVI   Yes [provider]  traMADol (ULTRAM-ER) 100 MG 24 hr tablet Take 100 mg by mouth 2 (two) times daily as needed for pain.   Yes [provider]  ALPRAZolam (XANAX) 0.5 MG tablet Take 0.25-0.5 mg by mouth 3 (three) times daily as needed for anxiety.    [provider]  Amantadine HCl 100 MG tablet Take 50-100 mg by mouth 2 (two) times daily. Patient not taking: Reported on 05/24/2022 05/19/22   [provider]  amphetamine-dextroamphetamine (ADDERALL) 30 MG tablet Take 30 mg by mouth 2 (two) times daily. Does not take 2nd dose after 3pm    [provider]  aspirin EC 81 MG tablet Take 1 tablet (81 mg total) by mouth daily. For heart health 02/09/17   Armandina Stammer I, NP  bisoprolol (ZEBETA) 5 MG tablet TAKE 1 TABLET BY MOUTH DAILY Patient taking differently: Take 5 mg by mouth daily as needed (for systolic bp greater than 131.). 12/14/21   Chilton Si, MD  calcium carbonate (OSCAL) 1500 (600 Ca) MG TABS tablet Take by mouth 2 (two) times daily with a meal. CALTRATE 600/400 IU    [provider]  doxazosin (CARDURA) 1 MG  tablet TAKE 1 TABLET BY MOUTH AT BEDTIME Patient taking differently: Take 1 mg by mouth at bedtime. 01/15/22   Duke Salvia, MD  DULoxetine (CYMBALTA) 60 MG capsule Take 60 mg by mouth in the morning.    [provider]  flecainide (TAMBOCOR) 50 MG tablet TAKE ONE (1) TABLET BY MOUTH TWO (2) TIMES DAILY Patient taking  differently: Take 50 mg by mouth 2 (two) times daily. 11/09/21   Chilton Si, MD  folic acid (FOLVITE) 1 MG tablet Take 1 mg by mouth in the morning.    [provider]  gabapentin (NEURONTIN) 300 MG capsule Take 300 mg by mouth at bedtime. 12/14/21   [provider]  glipiZIDE (GLUCOTROL XL) 5 MG 24 hr tablet Take 5 mg by mouth daily. 05/17/22   [provider]  losartan (COZAAR) 25 MG tablet TAKE ONE (1) TABLET BY MOUTH EACH DAY Patient taking differently: Take 25 mg by mouth daily as needed (high blood pressure). 11/09/21   Chilton Si, MD  magnesium gluconate (MAGONATE) 500 MG tablet Take 500 mg by mouth daily.    [provider]  metFORMIN (GLUCOPHAGE) 1000 MG tablet Take 1,000 mg by mouth 2 (two) times daily. 12/25/19   [provider]  midodrine (PROAMATINE) 2.5 MG tablet Take 1 tablet (2.5 mg total) by mouth 2 (two) times daily as needed. Patient taking differently: Take 2.5 mg by mouth as needed (blood pressure). 12/08/21   Chilton Si, MD  mupirocin ointment (BACTROBAN) 2 % Apply topically as needed. 05/20/22   [provider]  naproxen sodium (ALEVE) 220 MG tablet Take 220 mg by mouth 2 (two) times daily as needed.    [provider]  omeprazole (PRILOSEC) 40 MG capsule Take 40 mg by mouth 2 (two) times daily.    [provider]  ondansetron (ZOFRAN) 4 MG tablet Take 1 tablet (4 mg total) by mouth every 8 (eight) hours as needed for nausea or vomiting. 05/25/22   Janine Ores K, PA-C  QUEtiapine (SEROQUEL) 100 MG tablet Take 200 mg by mouth at bedtime.     [provider]  rOPINIRole (REQUIP) 0.5 MG tablet Take 0.5 mg by mouth at bedtime. Patient not taking: Reported on 07/07/2022    [provider]  rosuvastatin (CRESTOR) 20 MG tablet Take 20 mg by mouth daily.    [provider]     Positive ROS: All other systems have been reviewed and were otherwise negative with the exception of those mentioned in the HPI and as above.  Physical Exam: General: Alert, no acute distress Cardiovascular: No pedal edema Respiratory: No cyanosis, no use of accessory musculature GI: No organomegaly, abdomen is soft and non-tender Skin: No lesions in the area of chief complaint Neurologic: Sensation intact distally Psychiatric: Patient is competent for consent with normal mood and affect Lymphatic: No axillary or cervical lymphadenopathy  MUSCULOSKELETAL: right ankle TTP lateral malleolus, full ankle ROM, good strength, edema and fluctuance to lateral ankle, opening at distal pole of previous incision with pus coming out, blanching erythema present, NVI   Imaging: n/a   Assessment: OSTEOMYELITIS RIGHT ANKLE  Plan: Plan for Procedure(s): PARTIAL FIBULA BONE EXCISION WITH I&D  The risks benefits and alternatives were discussed with the patient including but not limited to the risks of nonoperative treatment, versus surgical intervention including infection, bleeding, nerve injury,  blood clots, cardiopulmonary complications, morbidity, mortality, among others, and they were willing to proceed.   Weightbearing: WBAT RLE Orthopedic devices: splint vs boot vs none Showering: POD 3, keep wound covered and dry Dressing: reinforce PRN, leave in place until office follow up Medicines: ASA, Norco, Mobic  Discharge: home Follow up: 1 week    Marzetta Board Office [phone] 07/07/2022 5:57 PM

## 2022-07-07 NOTE — Telephone Encounter (Signed)
   Pre-operative Risk Assessment    Patient Name: Brittney Cooper  DOB: [birth year] MRN: [id number]      Request for Surgical Clearance    Procedure:   I & D RIGHT ANKLE - OSTEOMYELITIS  Date of Surgery:  Clearance 07/08/22                                 Surgeon:  Margarita Rana, MD Surgeon's Group or Practice Name:  [facility] Phone number:  [phone] Fax number:  [phone]   Type of Clearance Requested:   - Medical    Type of Anesthesia:  Not Indicated   Additional requests/questions:    [name]   07/07/2022, 1:45 PM

## 2022-07-08 ENCOUNTER — Encounter (HOSPITAL_BASED_OUTPATIENT_CLINIC_OR_DEPARTMENT_OTHER)
Admission: RE | Disposition: A | Discharge status: Home / Self Care | Payer: Self-pay | Source: Home / Self Care | Attending: Orthopedic Surgery

## 2022-07-08 ENCOUNTER — Ambulatory Visit (HOSPITAL_BASED_OUTPATIENT_CLINIC_OR_DEPARTMENT_OTHER): Payer: Medicare Other | Admitting: Anesthesiology

## 2022-07-08 ENCOUNTER — Encounter (HOSPITAL_BASED_OUTPATIENT_CLINIC_OR_DEPARTMENT_OTHER): Payer: Self-pay | Admitting: Orthopedic Surgery

## 2022-07-08 ENCOUNTER — Other Ambulatory Visit: Payer: Self-pay

## 2022-07-08 ENCOUNTER — Ambulatory Visit (INDEPENDENT_AMBULATORY_CARE_PROVIDER_SITE_OTHER): Payer: Medicare Other

## 2022-07-08 ENCOUNTER — Ambulatory Visit (HOSPITAL_BASED_OUTPATIENT_CLINIC_OR_DEPARTMENT_OTHER)
Admission: RE | Admit: 2022-07-08 | Discharge: 2022-07-08 | Disposition: A | Discharge status: Home / Self Care | Payer: Medicare Other | Attending: Orthopedic Surgery | Admitting: Orthopedic Surgery

## 2022-07-08 DIAGNOSIS — Z7984 Long term (current) use of oral hypoglycemic drugs: Secondary | ICD-10-CM | POA: Insufficient documentation

## 2022-07-08 DIAGNOSIS — F319 Bipolar disorder, unspecified: Secondary | ICD-10-CM | POA: Insufficient documentation

## 2022-07-08 DIAGNOSIS — E1169 Type 2 diabetes mellitus with other specified complication: Secondary | ICD-10-CM | POA: Insufficient documentation

## 2022-07-08 DIAGNOSIS — K219 Gastro-esophageal reflux disease without esophagitis: Secondary | ICD-10-CM | POA: Insufficient documentation

## 2022-07-08 DIAGNOSIS — F909 Attention-deficit hyperactivity disorder, unspecified type: Secondary | ICD-10-CM | POA: Insufficient documentation

## 2022-07-08 DIAGNOSIS — Z8249 Family history of ischemic heart disease and other diseases of the circulatory system: Secondary | ICD-10-CM | POA: Diagnosis not present

## 2022-07-08 DIAGNOSIS — I10 Essential (primary) hypertension: Secondary | ICD-10-CM | POA: Insufficient documentation

## 2022-07-08 DIAGNOSIS — Z79899 Other long term (current) drug therapy: Secondary | ICD-10-CM | POA: Insufficient documentation

## 2022-07-08 DIAGNOSIS — F17210 Nicotine dependence, cigarettes, uncomplicated: Secondary | ICD-10-CM | POA: Insufficient documentation

## 2022-07-08 DIAGNOSIS — M1990 Unspecified osteoarthritis, unspecified site: Secondary | ICD-10-CM | POA: Diagnosis not present

## 2022-07-08 DIAGNOSIS — F419 Anxiety disorder, unspecified: Secondary | ICD-10-CM | POA: Insufficient documentation

## 2022-07-08 DIAGNOSIS — M869 Osteomyelitis, unspecified: Secondary | ICD-10-CM | POA: Diagnosis not present

## 2022-07-08 DIAGNOSIS — I442 Atrioventricular block, complete: Secondary | ICD-10-CM | POA: Diagnosis not present

## 2022-07-08 DIAGNOSIS — A4901 Methicillin susceptible Staphylococcus aureus infection, unspecified site: Secondary | ICD-10-CM

## 2022-07-08 DIAGNOSIS — M86061 Acute hematogenous osteomyelitis, right tibia and fibula: Secondary | ICD-10-CM

## 2022-07-08 DIAGNOSIS — Z01818 Encounter for other preprocedural examination: Secondary | ICD-10-CM

## 2022-07-08 HISTORY — PX: BONE EXCISION: SHX6730

## 2022-07-08 HISTORY — DX: Syncope and collapse: R55

## 2022-07-08 HISTORY — DX: Age-related osteoporosis without current pathological fracture: M81.0

## 2022-07-08 LAB — CUP PACEART REMOTE DEVICE CHECK
Date Time Interrogation Session: 20230713083939
Implantable Lead Implant Date: 20171017
Implantable Lead Implant Date: 20171017
Implantable Lead Location: 753859
Implantable Lead Location: 753860
Implantable Lead Model: 377
Implantable Lead Model: 377
Implantable Lead Serial Number: 49593512
Implantable Lead Serial Number: 49620910
Implantable Pulse Generator Implant Date: 20171017
Pulse Gen Model: 394969
Pulse Gen Serial Number: 68784192

## 2022-07-08 LAB — POCT I-STAT, CHEM 8
BUN: 11 mg/dL (ref 6–20)
Calcium, Ion: 1.2 mmol/L (ref 1.15–1.40)
Chloride: 97 mmol/L — ABNORMAL LOW (ref 98–111)
Creatinine, Ser: 0.4 mg/dL — ABNORMAL LOW (ref 0.44–1.00)
Glucose, Bld: 198 mg/dL — ABNORMAL HIGH (ref 70–99)
HCT: 40 % (ref 36.0–46.0)
Hemoglobin: 13.6 g/dL (ref 12.0–15.0)
Potassium: 3.7 mmol/L (ref 3.5–5.1)
Sodium: 137 mmol/L (ref 135–145)
TCO2: 28 mmol/L (ref 22–32)

## 2022-07-08 LAB — GLUCOSE, CAPILLARY: Glucose-Capillary: 192 mg/dL — ABNORMAL HIGH (ref 70–99)

## 2022-07-08 SURGERY — BONE EXCISION
Anesthesia: General | Site: Ankle | Laterality: Right

## 2022-07-08 MED ORDER — KETOROLAC TROMETHAMINE 30 MG/ML IJ SOLN
INTRAMUSCULAR | Status: DC | PRN
  Administered 2022-07-08: 30 mg via INTRAVENOUS

## 2022-07-08 MED ORDER — ONDANSETRON HCL 4 MG/2ML IJ SOLN
INTRAMUSCULAR | Status: AC
  Filled 2022-07-08: qty 2

## 2022-07-08 MED ORDER — MIDAZOLAM HCL 2 MG/2ML IJ SOLN
INTRAMUSCULAR | Status: AC
  Filled 2022-07-08: qty 2

## 2022-07-08 MED ORDER — MELOXICAM 15 MG PO TABS: 15.0000 mg | ORAL_TABLET | Freq: Every day | ORAL | 0 refills | Status: DC

## 2022-07-08 MED ORDER — OXYCODONE HCL 10 MG PO TABS: 10.0000 mg | ORAL_TABLET | Freq: Three times a day (TID) | ORAL | 0 refills | Status: DC | PRN

## 2022-07-08 MED ORDER — CEFAZOLIN SODIUM-DEXTROSE 2-4 GM/100ML-% IV SOLN
2.0000 g | INTRAVENOUS | Status: AC
  Administered 2022-07-08: 2 g via INTRAVENOUS

## 2022-07-08 MED ORDER — FENTANYL CITRATE (PF) 100 MCG/2ML IJ SOLN
INTRAMUSCULAR | Status: AC
  Filled 2022-07-08: qty 2

## 2022-07-08 MED ORDER — ONDANSETRON HCL 4 MG/2ML IJ SOLN
INTRAMUSCULAR | Status: DC | PRN
  Administered 2022-07-08: 4 mg via INTRAVENOUS

## 2022-07-08 MED ORDER — ACETAMINOPHEN 500 MG PO TABS: 1000.0000 mg | ORAL_TABLET | Freq: Once | ORAL | Status: DC

## 2022-07-08 MED ORDER — LIDOCAINE HCL (PF) 2 % IJ SOLN
INTRAMUSCULAR | Status: AC
  Filled 2022-07-08: qty 5

## 2022-07-08 MED ORDER — ASPIRIN 81 MG PO TBEC: 81.0000 mg | DELAYED_RELEASE_TABLET | Freq: Two times a day (BID) | ORAL | 0 refills | Status: DC

## 2022-07-08 MED ORDER — DEXAMETHASONE SODIUM PHOSPHATE 10 MG/ML IJ SOLN: 8.0000 mg | Freq: Once | INTRAMUSCULAR | Status: DC

## 2022-07-08 MED ORDER — HYDROCODONE-ACETAMINOPHEN 10-325 MG PO TABS: 1.0000 | ORAL_TABLET | Freq: Four times a day (QID) | ORAL | 0 refills | Status: DC | PRN

## 2022-07-08 MED ORDER — LIDOCAINE HCL (CARDIAC) PF 100 MG/5ML IV SOSY
PREFILLED_SYRINGE | INTRAVENOUS | Status: DC | PRN
  Administered 2022-07-08: 60 mg via INTRAVENOUS

## 2022-07-08 MED ORDER — VANCOMYCIN HCL 1000 MG IV SOLR
INTRAVENOUS | Status: AC
  Filled 2022-07-08: qty 20

## 2022-07-08 MED ORDER — FENTANYL CITRATE (PF) 100 MCG/2ML IJ SOLN
25.0000 ug | INTRAMUSCULAR | Status: DC | PRN
  Administered 2022-07-08: 25 ug via INTRAVENOUS

## 2022-07-08 MED ORDER — MIDAZOLAM HCL 5 MG/5ML IJ SOLN
INTRAMUSCULAR | Status: DC | PRN
  Administered 2022-07-08: 2 mg via INTRAVENOUS

## 2022-07-08 MED ORDER — FENTANYL CITRATE (PF) 100 MCG/2ML IJ SOLN
INTRAMUSCULAR | Status: DC | PRN
  Administered 2022-07-08 (×6): 25 ug via INTRAVENOUS
  Administered 2022-07-08: 50 ug via INTRAVENOUS

## 2022-07-08 MED ORDER — BUPIVACAINE HCL (PF) 0.5 % IJ SOLN
INTRAMUSCULAR | Status: DC | PRN
  Administered 2022-07-08: 20 mL

## 2022-07-08 MED ORDER — ACETAMINOPHEN 500 MG PO TABS
1000.0000 mg | ORAL_TABLET | Freq: Once | ORAL | Status: AC
  Administered 2022-07-08: 1000 mg via ORAL

## 2022-07-08 MED ORDER — 0.9 % SODIUM CHLORIDE (POUR BTL) OPTIME
TOPICAL | Status: DC | PRN
  Administered 2022-07-08: 1000 mL

## 2022-07-08 MED ORDER — ACETAMINOPHEN 500 MG PO TABS
ORAL_TABLET | ORAL | Status: AC
  Filled 2022-07-08: qty 2

## 2022-07-08 MED ORDER — PROPOFOL 10 MG/ML IV BOLUS
INTRAVENOUS | Status: DC | PRN
  Administered 2022-07-08: 200 mg via INTRAVENOUS

## 2022-07-08 MED ORDER — VANCOMYCIN HCL 1000 MG IV SOLR
INTRAVENOUS | Status: DC | PRN
  Administered 2022-07-08: 1000 mg

## 2022-07-08 MED ORDER — DEXAMETHASONE SODIUM PHOSPHATE 10 MG/ML IJ SOLN
INTRAMUSCULAR | Status: AC
  Filled 2022-07-08: qty 1

## 2022-07-08 MED ORDER — LACTATED RINGERS IV SOLN: INTRAVENOUS | Status: DC

## 2022-07-08 MED ORDER — PROPOFOL 10 MG/ML IV BOLUS
INTRAVENOUS | Status: AC
  Filled 2022-07-08: qty 20

## 2022-07-08 MED ORDER — CEFAZOLIN SODIUM-DEXTROSE 2-4 GM/100ML-% IV SOLN
INTRAVENOUS | Status: AC
  Filled 2022-07-08: qty 100

## 2022-07-08 MED ORDER — ONDANSETRON 4 MG PO TBDP: 4.0000 mg | ORAL_TABLET | Freq: Three times a day (TID) | ORAL | 0 refills | Status: DC | PRN

## 2022-07-08 MED ORDER — POVIDONE-IODINE 10 % EX SWAB: 2.0000 | Freq: Once | CUTANEOUS | Status: DC

## 2022-07-08 MED ORDER — DEXAMETHASONE SODIUM PHOSPHATE 4 MG/ML IJ SOLN
INTRAMUSCULAR | Status: DC | PRN
  Administered 2022-07-08: 5 mg via INTRAVENOUS

## 2022-07-08 SURGICAL SUPPLY — 60 items
APL PRP STRL LF DISP 70% ISPRP (MISCELLANEOUS) ×1
BLADE SURG 15 STRL LF DISP TIS (BLADE) ×2 IMPLANT
BLADE SURG 15 STRL SS (BLADE) ×2
BNDG CMPR 5X3 CHSV STRCH STRL (GAUZE/BANDAGES/DRESSINGS) ×1
BNDG COHESIVE 3X5 TAN ST LF (GAUZE/BANDAGES/DRESSINGS) ×1 IMPLANT
BNDG ELASTIC 6X5.8 VLCR STR LF (GAUZE/BANDAGES/DRESSINGS) ×1 IMPLANT
BNDG GAUZE DERMACEA FLUFF (GAUZE/BANDAGES/DRESSINGS) ×2
BNDG GAUZE DERMACEA FLUFF 4 (GAUZE/BANDAGES/DRESSINGS) IMPLANT
BNDG GZE DERMACEA 4 6PLY (GAUZE/BANDAGES/DRESSINGS) ×2
CHLORAPREP W/TINT 26 (MISCELLANEOUS) ×3 IMPLANT
CNTNR URN SCR LID CUP LEK RST (MISCELLANEOUS) IMPLANT
CONT SPEC 4OZ STRL OR WHT (MISCELLANEOUS) ×2
COVER BACK TABLE 60X90IN (DRAPES) ×3 IMPLANT
COVER MAYO STAND STRL (DRAPES) ×3 IMPLANT
CUFF TOURN SGL QUICK 24 (TOURNIQUET CUFF) ×2
CUFF TRNQT CYL 24X4X16.5-23 (TOURNIQUET CUFF) IMPLANT
DECANTER SPIKE VIAL GLASS SM (MISCELLANEOUS) IMPLANT
DRAIN PENROSE 0.25X18 (DRAIN) ×1 IMPLANT
DRAPE EXTREMITY T 121X128X90 (DISPOSABLE) ×3 IMPLANT
DRAPE INCISE IOBAN 66X45 STRL (DRAPES) IMPLANT
DRSG EMULSION OIL 3X3 NADH (GAUZE/BANDAGES/DRESSINGS) ×3 IMPLANT
DRSG PAD ABDOMINAL 8X10 ST (GAUZE/BANDAGES/DRESSINGS) ×3 IMPLANT
ELECT REM PT RETURN 9FT ADLT (ELECTROSURGICAL) ×2
ELECTRODE REM PT RTRN 9FT ADLT (ELECTROSURGICAL) ×2 IMPLANT
GAUZE 4X4 16PLY ~~LOC~~+RFID DBL (SPONGE) ×3 IMPLANT
GAUZE SPONGE 4X4 12PLY STRL (GAUZE/BANDAGES/DRESSINGS) ×4 IMPLANT
GLOVE BIO SURGEON STRL SZ7.5 (GLOVE) ×3 IMPLANT
GLOVE BIOGEL PI IND STRL 7.5 (GLOVE) ×2 IMPLANT
GLOVE BIOGEL PI IND STRL 8 (GLOVE) ×2 IMPLANT
GLOVE BIOGEL PI INDICATOR 7.5 (GLOVE) ×1
GLOVE BIOGEL PI INDICATOR 8 (GLOVE) ×1
GLOVE SURG SS PI 7.5 STRL IVOR (GLOVE) ×3 IMPLANT
GOWN STRL REUS W/ TWL LRG LVL3 (GOWN DISPOSABLE) ×2 IMPLANT
GOWN STRL REUS W/TWL LRG LVL3 (GOWN DISPOSABLE) ×2
GOWN STRL REUS W/TWL XL LVL3 (GOWN DISPOSABLE) ×3 IMPLANT
NEEDLE HYPO 22GX1.5 SAFETY (NEEDLE) IMPLANT
NS IRRIG 1000ML POUR BTL (IV SOLUTION) ×3 IMPLANT
PACK BASIN DAY SURGERY FS (CUSTOM PROCEDURE TRAY) ×3 IMPLANT
PAD CAST 4YDX4 CTTN HI CHSV (CAST SUPPLIES) IMPLANT
PADDING CAST COTTON 4X4 STRL (CAST SUPPLIES) ×2
PADDING CAST COTTON 6X4 STRL (CAST SUPPLIES) ×1 IMPLANT
PENCIL SMOKE EVACUATOR (MISCELLANEOUS) ×3 IMPLANT
SPONGE T-LAP 4X18 ~~LOC~~+RFID (SPONGE) IMPLANT
STRIP CLOSURE SKIN 1/2X4 (GAUZE/BANDAGES/DRESSINGS) IMPLANT
SUCTION FRAZIER HANDLE 10FR (MISCELLANEOUS) ×2
SUCTION TUBE FRAZIER 10FR DISP (MISCELLANEOUS) IMPLANT
SUT ETHILON 3 0 PS 1 (SUTURE) ×1 IMPLANT
SUT MON AB 2-0 CT1 36 (SUTURE) ×3 IMPLANT
SUT MON AB 4-0 PC3 18 (SUTURE) ×3 IMPLANT
SUT VIC AB 0 CT1 27 (SUTURE) ×2
SUT VIC AB 0 CT1 27XBRD ANTBC (SUTURE) IMPLANT
SUT VIC AB 2-0 SH 27 (SUTURE) ×2
SUT VIC AB 2-0 SH 27XBRD (SUTURE) IMPLANT
SYR BULB EAR ULCER 3OZ GRN STR (SYRINGE) ×3 IMPLANT
SYR BULB IRRIG 60ML STRL (SYRINGE) ×1 IMPLANT
SYR CONTROL 10ML LL (SYRINGE) IMPLANT
TOWEL OR 17X26 10 PK STRL BLUE (TOWEL DISPOSABLE) ×6 IMPLANT
TUBE CONNECTING 12X1/4 (SUCTIONS) ×3 IMPLANT
UNDERPAD 30X36 HEAVY ABSORB (UNDERPADS AND DIAPERS) ×3 IMPLANT
YANKAUER SUCT BULB TIP NO VENT (SUCTIONS) ×3 IMPLANT

## 2022-07-08 NOTE — Discharge Instructions (Addendum)
POST-OPERATIVE OPIOID TAPER INSTRUCTIONS: It is important to wean off of your opioid medication as soon as possible. If you do not need pain medication after your surgery it is ok to stop day one. Opioids include: Codeine, Hydrocodone(Norco, Vicodin), Oxycodone(Percocet, oxycontin) and hydromorphone amongst others.  Long term and even short term use of opiods can cause: Increased pain response Dependence Constipation Depression Respiratory depression And more.  Withdrawal symptoms can include Flu like symptoms Nausea, vomiting And more Techniques to manage these symptoms Hydrate well Eat regular healthy meals Stay active Use relaxation techniques(deep breathing, meditating, yoga) Do Not substitute Alcohol to help with tapering If you have been on opioids for less than two weeks and do not have pain than it is ok to stop all together.  Plan to wean off of opioids This plan should start within one week post op of your joint replacement. Maintain the same interval or time between taking each dose and first decrease the dose.  Cut the total daily intake of opioids by one tablet each day Next start to increase the time between doses. The last dose that should be eliminated is the evening dose.      Post Anesthesia Home Care Instructions  Activity: Get plenty of rest for the remainder of the day. A responsible individual must stay with you for 24 hours following the procedure.  For the next 24 hours, DO NOT: -Drive a car -Advertising copywriter -Drink alcoholic beverages -Take any medication unless instructed by your physician -Make any legal decisions or sign important papers.  Meals: Start with liquid foods such as gelatin or soup. Progress to regular foods as tolerated. Avoid greasy, spicy, heavy foods. If nausea and/or vomiting occur, drink only clear liquids until the nausea and/or vomiting subsides. Call your physician if vomiting continues.  Special  Instructions/Symptoms: Your throat may feel dry or sore from the anesthesia or the breathing tube placed in your throat during surgery. If this causes discomfort, gargle with warm salt water. The discomfort should disappear within 24 hours.  Do not take any Tylenol until after 3:30 pm today if needed. Do not take any nonsteroidal anti inflammatories until after 5:45 pm today if needed.

## 2022-07-08 NOTE — Anesthesia Postprocedure Evaluation (Signed)
Anesthesia Post Note  Patient: Brittney Cooper  Procedure(s) Performed: PARTIAL FIBULA BONE EXCISION WITH I&D (Right: Ankle)     Patient location during evaluation: PACU Anesthesia Type: General Level of consciousness: awake Pain management: pain level controlled Vital Signs Assessment: post-procedure vital signs reviewed and stable Cardiovascular status: stable Postop Assessment: no apparent nausea or vomiting Anesthetic complications: no   No notable events documented.  Last Vitals:  Vitals:   07/08/22 0915 07/08/22 1207  BP: (!) 131/104 (!) 147/94  Pulse: (!) 104 81  Resp: 17 13  Temp: 36.7 C 36.6 C  SpO2: 97% 100%    Last Pain:  Vitals:   07/08/22 1207  TempSrc:   PainSc: 3                  Franki Stemen

## 2022-07-08 NOTE — Interval H&P Note (Signed)
History and Physical Interval Note:  07/08/2022 9:20 AM  Brittney Cooper  has presented today for surgery, with the diagnosis of OSTEOMYELITIS RIGHT ANKLE.  The various methods of treatment have been discussed with the patient and family. After consideration of risks, benefits and other options for treatment, the patient has consented to  Procedure(s): PARTIAL FIBULA BONE EXCISION WITH I&D (Right) as a surgical intervention.  The patient's history has been reviewed, patient examined, no change in status, stable for surgery.  I have reviewed the patient's chart and labs.  Questions were answered to the patient's satisfaction.     Sheral Apley

## 2022-07-08 NOTE — Transfer of Care (Signed)
Immediate Anesthesia Transfer of Care Note  Patient: Brittney Cooper  Procedure(s) Performed: Procedure(s) (LRB): PARTIAL FIBULA BONE EXCISION WITH I&D (Right)  Patient Location: PACU  Anesthesia Type: General  Level of Consciousness: awake, sedated, patient cooperative and responds to stimulation  Airway & Oxygen Therapy: Patient Spontanous Breathing and Patient connected to [place] 02 and soft FM  Post-op Assessment: Report given to PACU RN, Post -op Vital signs reviewed and stable and Patient moving all extremities  Post vital signs: Reviewed and stable  Complications: No apparent anesthesia complications

## 2022-07-08 NOTE — Anesthesia Procedure Notes (Signed)
Procedure Name: LMA Insertion Date/Time: 07/08/2022 11:08 AM  Performed by: Jessica Priest, CRNAPre-anesthesia Checklist: Patient identified, Emergency Drugs available, Suction available, Patient being monitored and Timeout performed Patient Re-evaluated:Patient Re-evaluated prior to induction Oxygen Delivery Method: Circle system utilized Preoxygenation: Pre-oxygenation with 100% oxygen Induction Type: IV induction Ventilation: Mask ventilation without difficulty LMA: LMA inserted LMA Size: 4.0 Number of attempts: 1 Airway Equipment and Method: Bite block Placement Confirmation: positive ETCO2, breath sounds checked- equal and bilateral and CO2 detector Tube secured with: Tape Dental Injury: Teeth and Oropharynx as per pre-operative assessment

## 2022-07-08 NOTE — Anesthesia Preprocedure Evaluation (Addendum)
Anesthesia Evaluation  Patient identified by MRN, date of birth, ID band Patient awake    Reviewed: Allergy & Precautions, NPO status , Patient's Chart, lab work & pertinent test results, reviewed documented beta blocker date and time   Airway Mallampati: III  TM Distance: >3 FB Neck ROM: Full    Dental  (+) Chipped, Dental Advisory Given,    Pulmonary Current SmokerPatient did not abstain from smoking.,    Pulmonary exam normal breath sounds clear to auscultation       Cardiovascular hypertension, Pt. on home beta blockers and Pt. on medications Normal cardiovascular exam+ pacemaker  Rhythm:Regular Rate:Normal  TTE 2021 1. Left ventricular ejection fraction, by visual estimation, is 60 to  65%. The left ventricle has normal function. There is no left ventricular  hypertrophy.  2. The left ventricle has no regional wall motion abnormalities.  3. Global right ventricle has normal systolic function.The right  ventricular size is normal. No increase in right ventricular wall  thickness.  4. Left atrial size was normal.  5. Right atrial size was normal.  6. The mitral valve is normal in structure. Trivial mitral valve  regurgitation. No evidence of mitral stenosis.  7. The tricuspid valve is normal in structure.  8. The tricuspid valve is normal in structure. Tricuspid valve  regurgitation is not demonstrated.  9. The aortic valve is normal in structure. Aortic valve regurgitation is  not visualized. No evidence of aortic valve sclerosis or stenosis.  10. The pulmonic valve was normal in structure. Pulmonic valve  regurgitation is not visualized.  11. Mildly elevated pulmonary artery systolic pressure.  12. The inferior vena cava is normal in size with greater than 50%  respiratory variability, suggesting right atrial pressure of 3 mmHg.    Neuro/Psych PSYCHIATRIC DISORDERS Anxiety Depression Bipolar Disorder negative  neurological ROS     GI/Hepatic GERD  Medicated,(+)     substance abuse  alcohol use,   Endo/Other  diabetes, Type 2, Oral Hypoglycemic Agents  Renal/GU negative Renal ROS  negative genitourinary   Musculoskeletal  (+) Arthritis ,   Abdominal   Peds  (+) ADHD Hematology negative hematology ROS (+)   Anesthesia Other Findings   Reproductive/Obstetrics                         Anesthesia Physical Anesthesia Plan  ASA: 3  Anesthesia Plan: General   Post-op Pain Management: Tylenol PO (pre-op)*   Induction: Intravenous  PONV Risk Score and Plan: 2 and Ondansetron, Dexamethasone and Midazolam  Airway Management Planned: LMA  Additional Equipment:   Intra-op Plan:   Post-operative Plan: Extubation in OR  Informed Consent: I have reviewed the patients History and Physical, chart, labs and discussed the procedure including the risks, benefits and alternatives for the proposed anesthesia with the patient or authorized representative who has indicated his/her understanding and acceptance.     Dental advisory given  Plan Discussed with: CRNA  Anesthesia Plan Comments:         Anesthesia Quick Evaluation

## 2022-07-09 ENCOUNTER — Encounter (HOSPITAL_BASED_OUTPATIENT_CLINIC_OR_DEPARTMENT_OTHER): Payer: Self-pay | Admitting: Orthopedic Surgery

## 2022-07-09 NOTE — Op Note (Signed)
07/08/2022  8:07 AM  PATIENT:  Brittney Cooper    PRE-OPERATIVE DIAGNOSIS:  OSTEOMYELITIS RIGHT ANKLE  POST-OPERATIVE DIAGNOSIS:  Same  PROCEDURE:  PARTIAL FIBULA BONE EXCISION WITH I&D  SURGEON:  Sheral Apley, MD  ASSISTANT: Levester Fresh, PA-C, he was present and scrubbed throughout the case, critical for completion in a timely fashion, and for retraction, instrumentation, and closure.   ANESTHESIA:   gen  PREOPERATIVE INDICATIONS:  [name] is a  61 y.o. female with a diagnosis of OSTEOMYELITIS RIGHT ANKLE who failed conservative measures and elected for surgical management.    The risks benefits and alternatives were discussed with the patient preoperatively including but not limited to the risks of infection, bleeding, nerve injury, cardiopulmonary complications, the need for revision surgery, among others, and the patient was willing to proceed.  OPERATIVE IMPLANTS: none  OPERATIVE FINDINGS: lateral devitalized bone  BLOOD LOSS: min  COMPLICATIONS: none  TOURNIQUET TIME: 30  OPERATIVE PROCEDURE:  Patient was identified in the preoperative holding area and site was marked by me She was transported to the operating theater and placed on the table in supine position taking care to pad all bony prominences. After a preincinduction time out anesthesia was induced. The right lower extremity was prepped and draped in normal sterile fashion and a pre-incision timeout was performed. She received ancef for preoperative antibiotics.   I extended her lateral wound proximally and distally to expose collection of purulent fluid and lateral fibular bone that did appear to be stripped of periosteum and devitalized surrounding bone.  Healthy the central portion I then debrided and used an osteotome to create a cortical window and saucerization of the fibula there.  I debrided any devitalized tissue using a curette  I irrigated and performed a repeat debridement and then another  irrigation  I then placed Vanco powder in her incision followed by a Penrose drain and performed a complex closure of her wound.  Sterile dressing was applied  POST OPERATIVE PLAN: She can weight-bear as tolerated in a boot mobilize and chemical DVT prophylaxis

## 2022-07-12 ENCOUNTER — Telehealth: Payer: Self-pay

## 2022-07-12 NOTE — Telephone Encounter (Signed)
No messages received for at least 21 days Last message received 22 days ago. The patient will be deactivated in 68 days.  Will contact Pt.

## 2022-07-12 NOTE — Telephone Encounter (Signed)
I spoke with the patient and she is going to unplug it and plug it back in. She also going to call [employer] as well. I told her I will call back on Wednesday to see if she getting a new monitor or if her monitor updated.

## 2022-07-13 ENCOUNTER — Telehealth: Payer: Self-pay

## 2022-07-13 ENCOUNTER — Ambulatory Visit: Payer: Medicare Other | Admitting: Internal Medicine

## 2022-07-13 LAB — AEROBIC/ANAEROBIC CULTURE W GRAM STAIN (SURGICAL/DEEP WOUND)
Culture: NO GROWTH
Gram Stain: NONE SEEN

## 2022-07-13 NOTE — Progress Notes (Deleted)
[facility]  CHIEF COMPLAINT:    Follow up for osteomyelitis  SUBJECTIVE:    Brittney Cooper is a 61 y.o. female with PMHx as below who presents to the clinic for osteomyelitis.   Patient has a history of right ankle fracture s/p ORIF approximately 5-6 years ago by Dr Eulah Pont.  She recently presented to the orthopedic office last month with a draining, painful lateral wound overlying the fibula.  She was taken to the OR on 05/23/22 by Dr Dion Saucier for debridement where she was found to have an abscess overlying the hardware with a small element of distal fibular OM.  She had all hardware removed at the time of her surgery.  Her operative cultures grew MSSA and she has been on cefazolin 2gm every 8 hours via PICC line since that time.    I saw her last on 07/02/22 at which time she had started having drainage from her incision following suture removal.  She had also been doing a lot of walking after taking her son to college.  She also missed a few doses of antibiotics over the 6 week period.  She went back to the OR with Dr Eulah Pont on 07/08/22 at which point there was a collection of purulent fluid encountered and her later fibular bone appeared devitalized.  This was debrided and an osteotome used to create a cortical window and saucerization of the fibula.  Repeat cultures were collected and are no growth.  She continues on cefazolin 2gm q8h.    Please see A&P for the details of today's visit and status of the patient's medical problems.   Patient's Medications  New Prescriptions   No medications on file  Previous Medications   ALPRAZOLAM (XANAX) 0.5 MG TABLET    Take 0.25-0.5 mg by mouth 3 (three) times daily as needed for anxiety.   AMANTADINE HCL 100 MG TABLET    Take 50-100 mg by mouth 2 (two) times daily.   AMPHETAMINE-DEXTROAMPHETAMINE (ADDERALL) 30 MG TABLET    Take 30 mg by mouth 2 (two) times daily. Does not take 2nd dose after 3pm   ASPIRIN EC 81 MG  TABLET    Take 1 tablet (81 mg total) by mouth 2 (two) times daily. To prevent blood clots for 30 days after surgery.   BISOPROLOL (ZEBETA) 5 MG TABLET    TAKE 1 TABLET BY MOUTH DAILY   CALCIUM CARBONATE (OSCAL) 1500 (600 CA) MG TABS TABLET    Take by mouth 2 (two) times daily with a meal. CALTRATE 600/400 IU   DOXAZOSIN (CARDURA) 1 MG TABLET    TAKE 1 TABLET BY MOUTH AT BEDTIME   DULOXETINE (CYMBALTA) 60 MG CAPSULE    Take 60 mg by mouth in the morning.   FLECAINIDE (TAMBOCOR) 50 MG TABLET    TAKE ONE (1) TABLET BY MOUTH TWO (2) TIMES DAILY   FOLIC ACID (FOLVITE) 1 MG TABLET    Take 1 mg by mouth in the morning.   GABAPENTIN (NEURONTIN) 300 MG CAPSULE    Take 300 mg by mouth at bedtime.   GLIPIZIDE (GLUCOTROL XL) 5 MG 24 HR TABLET    Take 5 mg by mouth daily.   HYDROCODONE-ACETAMINOPHEN (NORCO) 10-325 MG TABLET    Take 1 tablet by mouth every 6 (six) hours as needed for severe pain.   LOSARTAN (COZAAR) 25 MG TABLET    TAKE ONE (1) TABLET BY MOUTH EACH DAY   MAGNESIUM  GLUCONATE (MAGONATE) 500 MG TABLET    Take 500 mg by mouth daily.   MELOXICAM (MOBIC) 15 MG TABLET    Take 1 tablet (15 mg total) by mouth daily. For pain and inflammation   METFORMIN (GLUCOPHAGE) 1000 MG TABLET    Take 1,000 mg by mouth 2 (two) times daily.   MIDODRINE (PROAMATINE) 2.5 MG TABLET    Take 1 tablet (2.5 mg total) by mouth 2 (two) times daily as needed.   MULTIPLE VITAMINS-MINERALS (MULTIVITAMIN WITH MINERALS) TABLET    Take 2 tablets by mouth daily. WOMENS OVER 50 MVI   MUPIROCIN OINTMENT (BACTROBAN) 2 %    Apply topically as needed.   OMEPRAZOLE (PRILOSEC) 40 MG CAPSULE    Take 40 mg by mouth 2 (two) times daily.   ONDANSETRON (ZOFRAN) 4 MG TABLET    Take 1 tablet (4 mg total) by mouth every 8 (eight) hours as needed for nausea or vomiting.   ONDANSETRON (ZOFRAN-ODT) 4 MG DISINTEGRATING TABLET    Take 1 tablet (4 mg total) by mouth every 8 (eight) hours as needed for nausea or vomiting.   OXYCODONE HCL 10 MG TABS     Take 1 tablet (10 mg total) by mouth every 8 (eight) hours as needed (for severe pain after surgery).   QUETIAPINE (SEROQUEL) 100 MG TABLET    Take 200 mg by mouth at bedtime.    ROPINIROLE (REQUIP) 0.5 MG TABLET    Take 0.5 mg by mouth at bedtime.   ROSUVASTATIN (CRESTOR) 20 MG TABLET    Take 20 mg by mouth daily.  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   Allergy    Anxiety    Arthritis    Bipolar disorder (HCC) 2    Cardiac arrest (HCC) 06-2015, 08-2016   x3- pt has pacemaker    Chronic pain    back   Complication of anesthesia    woke up during 2 surgical procedure hydterectomy and colonscopy   Depression    Diabetes mellitus without complication (HCC)    Type 2   Esophageal mass 02/2019   has upper endo with [place] with biopsy   Fatty liver    Fracture of lateral malleolus of right fibula 06/2015   GERD (gastroesophageal reflux disease)    history ofHepatomegaly    Hyperlipidemia    Hypertension    Neurocardiogenic syncope    faints 2 x week   Neuromuscular disorder (HCC)    Dystonia   OCD (obsessive compulsive disorder)    OCD (obsessive compulsive disorder)    Osteoporosis    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   Pancreatic pseudocyst    Panic attack    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, 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   Syncope    neurocardiogenic   Third degree heart block (HCC)     Social History   Tobacco Use   Smoking status: Every Day    Packs/day: 1.00    Years: 15.00    Total pack years: 15.00    Types: Cigarettes   Smokeless tobacco: Never   Tobacco comments:    1/2 -1 ppd  Vaping Use   Vaping Use: Former   Substances: Nicotine  Substance Use Topics   Alcohol use: Yes    Comment: social   Drug  use: No    Family History  Problem Relation Age of Onset   Stroke Mother    Kidney disease Mother    Colon polyps Mother    Hypertension Mother    Kidney failure Mother    Lung cancer Father        stage IV    Colon polyps Father    Hypertension Father    Stomach cancer Maternal Grandfather    Colon cancer Paternal Grandfather    Hypertension Sister    Hyperlipidemia Sister    Hypertension Brother    Hyperlipidemia Brother    Rectal cancer Neg Hx    Esophageal cancer Neg Hx    Pancreatic cancer Neg Hx     Allergies  Allergen Reactions   Codeine Nausea And Vomiting   Demerol [Meperidine] Other (See Comments)    Hallucinations    Hydrocodone-Acetaminophen Nausea And Vomiting    Pt reports Vicodin caused "vomiting for six hours," reports being able to tolerate oxycodone and tramadol. Can take tylenol   Jardiance [Empagliflozin] Other (See Comments)    Yeast infections   Lopressor [Metoprolol] Other (See Comments)    nightmares    ROS   OBJECTIVE:    There were no vitals filed for this visit. There is no height or weight on file to calculate BMI.  Physical Exam   Labs and Microbiology:    Latest Ref Rng & Units 07/08/2022    9:30 AM 07/02/2022    2:06 AM 05/25/2022    3:33 AM  CBC  WBC 3.8 - 10.8 Thousand/uL  6.7  5.7   Hemoglobin 12.0 - 15.0 g/dL 14.1  03.0  9.8   Hematocrit 36.0 - 46.0 % 40.0  40.7  30.5   Platelets 140 - 400 Thousand/uL  284  212       Latest Ref Rng & Units 07/08/2022    9:30 AM 05/24/2022    2:05 AM 05/23/2022    7:30 AM  CMP  Glucose 70 - 99 mg/dL 131  438  887   BUN 6 - 20 mg/dL 11  16  16    Creatinine 0.44 - 1.00 mg/dL  5.79  7.28   Sodium 135 - 145 mmol/L 137  135  136   Potassium 3.5 - 5.1 mmol/L 3.7  3.8  3.4   Chloride 98 - 111 mmol/L 97  101  100   CO2 22 - 32 mmol/L  28  28   Calcium 8.9 - 10.3 mg/dL  8.9  9.2   Total Protein 6.5 - 8.1 g/dL  5.6  6.5   Total Bilirubin 0.3 - 1.2 mg/dL  0.4  1.1   Alkaline Phos 38 -  126 U/L  95  91   AST 15 - 41 U/L  13  16   ALT 0 - 44 U/L  21  23      Recent Results (from the past 240 hour(s))  Fungus Culture With Stain     Status: None (Preliminary result)   Collection Time: 07/08/22 11:31 AM   Specimen: PATH Benign ortho; Tissue  Result Value Ref Range Status   Fungus Stain Final report  Final    Comment: (NOTE) Performed At: [facility] [address] [place], [place] [id number] MD Jolene Schimke    Fungus (Mycology) Culture PENDING  Incomplete   Fungal Source BONE  Final  Comment: RT FIBULA POF ANCEF ANCEF 2GM Performed at [facility], [address] [place], [place] [address]   Aerobic/Anaerobic Culture w Gram Stain (surgical/deep wound)     Status: None   Collection Time: 07/08/22 11:31 AM   Specimen: PATH Benign ortho; Tissue  Result Value Ref Range Status   Specimen Description   Final    BONE RT FIBULA Performed at [name], [address], [place], [place] [address]    Special Requests   Final    PATIENT ON FOLLOWING ANCEF 2GM Performed at [facility], [address] [place], [place] [address]    Gram Stain NO WBC SEEN NO ORGANISMS SEEN   Final   Culture   Final    No growth aerobically or anaerobically. Performed at [facility], [address] [place], [place] [address]    Report Status 07/13/2022 FINAL  Final  Fungus Culture Result     Status: None   Collection Time: 07/08/22 11:31 AM  Result Value Ref Range Status   Result 1 Comment  Final    Comment: (NOTE) KOH/Calcofluor preparation:  no fungus observed. Performed At: [facility] [address] [place], [place] [id number] Jolene Schimke MD SV:[id number]     Imaging: ***   ASSESSMENT & PLAN:    No problem-specific Assessment & Plan notes found for this encounter.   No orders of the defined types were placed in this encounter.    There are no diagnoses linked  to this encounter.  She is currently on cefazolin 2gm IV q8h for initial osteofixation infection and abscess of the right ankle s/p removal of hardware, debridement, and I&D on 05/23/22.  Her OR cultures at that time grew MSSA.  She was improving and her inflammatory markers normalized, however, she had to return to the OR on 07/08/22 for repeat debridement after developing wound drainage.  I will extend her IV antibiotics through 07/22/22 and then plan to transition to oral Cefadroxil for another 4 weeks to complete 6 weeks total from date of her most recent surgery.  I will follow up with her via virtual visit in 1 week to confirm IV to oral antibiotic transition.     Vedia Coffer for Infectious Disease [facility] 07/13/2022, 12:41 PM

## 2022-07-13 NOTE — Telephone Encounter (Signed)
I spoke with the patient to let her know that her monitor is working. Transmitted 07/13/2022. [employer] is also sending her an updated monitor.

## 2022-07-13 NOTE — Telephone Encounter (Signed)
Spoke to patient in regards to missed appointment today with Dr.Wallace - patient was unable to reschedule over the phone due to being at dentist appointment. Patient will call back to reschedule. Dr.Wallace has availability on 7/25 if patient is able to come in. IV antibiotics will need to be extended until appointment date if patient schedules on that day.    Mettie Roylance P Apolonia Ellwood, CMA .

## 2022-07-14 ENCOUNTER — Telehealth: Payer: Self-pay

## 2022-07-14 NOTE — Telephone Encounter (Signed)
Called [facility] to give verbal to extend IV antibiotics. Pharmacist unavailable. Community message sent to [employer] - Per Dr.Wallace - extend IV Cefazolin until 7/25. Patient scheduled for follow up on 7/25.   Karlynn Furrow Lesli Albee, CMA

## 2022-07-14 NOTE — Telephone Encounter (Signed)
Got it - thanks Tiffany!

## 2022-07-20 ENCOUNTER — Ambulatory Visit: Payer: Medicare Other | Admitting: Internal Medicine

## 2022-07-20 ENCOUNTER — Telehealth: Payer: Self-pay

## 2022-07-20 NOTE — Progress Notes (Deleted)
[facility]  CHIEF COMPLAINT:    Follow up for osteofixation infection  SUBJECTIVE:    Brittney Cooper is a 61 y.o. female with PMHx as below who presents to the clinic for osteofixation infection.   She has a history of right ankle fracture s/p ORIF approximately 5-6 years ago by Dr Percell Miller.  She recently presented to the orthopedic office in May 2023 with a draining, painful lateral wound overlying the fibula.  She was taken to the OR on 05/23/22 by Dr Mardelle Matte for debridement where she was found to have an abscess overlying the hardware with a small element of distal fibular OM.  She had all hardware removed at the time of her surgery.  Her operative cultures grew MSSA and she has been on cefazolin 2gm every 8 hours via PICC line since that time.    She had follow up with orthopedic surgery early this month where sutures were removed.  She then started having some drainage from her incision.  She saw Dr Percell Miller who told her she may need to go back to the OR for a repeat washout.  She was seen by myself for follow up on 07/02/22. She had been adherent with antibiotic regimen for the most part with exception of a couple doses over the 6 weeks.  She was supposed to complete 6 weeks of IV therapy on 07/04/22 however this was extended following her visit on 07/02/22.  She did go back to the OR on 07/08/22 with Dr Percell Miller.  She was found to have a purulent fluid collection and lateral fibular bone that appeared devitalized.  This was debrided and an osteotome was used to create a cortical window and saucerization of fibula here.  She was discharged with a Penrose drain following complex closure of the wound.  She was scheduled for follow up with myself on 07/13/22 but was unable to keep this appointment due to a schedule conflict.  She remains on IV cefazolin via PICC line and has now completed > 8 weeks total and approximately 2 weeks since her last debridement.   Please see A&P  for the details of today's visit and status of the patient's medical problems.   Patient's Medications  New Prescriptions   No medications on file  Previous Medications   ALPRAZOLAM (XANAX) 0.5 MG TABLET    Take 0.25-0.5 mg by mouth 3 (three) times daily as needed for anxiety.   AMANTADINE HCL 100 MG TABLET    Take 50-100 mg by mouth 2 (two) times daily.   AMPHETAMINE-DEXTROAMPHETAMINE (ADDERALL) 30 MG TABLET    Take 30 mg by mouth 2 (two) times daily. Does not take 2nd dose after 3pm   ASPIRIN EC 81 MG TABLET    Take 1 tablet (81 mg total) by mouth 2 (two) times daily. To prevent blood clots for 30 days after surgery.   BISOPROLOL (ZEBETA) 5 MG TABLET    TAKE 1 TABLET BY MOUTH DAILY   CALCIUM CARBONATE (OSCAL) 1500 (600 CA) MG TABS TABLET    Take by mouth 2 (two) times daily with a meal. CALTRATE 600/400 IU   DOXAZOSIN (CARDURA) 1 MG TABLET    TAKE 1 TABLET BY MOUTH AT BEDTIME   DULOXETINE (CYMBALTA) 60 MG CAPSULE    Take 60 mg by mouth in the morning.   FLECAINIDE (TAMBOCOR) 50 MG TABLET    TAKE ONE (1) TABLET BY MOUTH TWO (2) TIMES DAILY  FOLIC ACID (FOLVITE) 1 MG TABLET    Take 1 mg by mouth in the morning.   GABAPENTIN (NEURONTIN) 300 MG CAPSULE    Take 300 mg by mouth at bedtime.   GLIPIZIDE (GLUCOTROL XL) 5 MG 24 HR TABLET    Take 5 mg by mouth daily.   HYDROCODONE-ACETAMINOPHEN (NORCO) 10-325 MG TABLET    Take 1 tablet by mouth every 6 (six) hours as needed for severe pain.   LOSARTAN (COZAAR) 25 MG TABLET    TAKE ONE (1) TABLET BY MOUTH EACH DAY   MAGNESIUM GLUCONATE (MAGONATE) 500 MG TABLET    Take 500 mg by mouth daily.   MELOXICAM (MOBIC) 15 MG TABLET    Take 1 tablet (15 mg total) by mouth daily. For pain and inflammation   METFORMIN (GLUCOPHAGE) 1000 MG TABLET    Take 1,000 mg by mouth 2 (two) times daily.   MIDODRINE (PROAMATINE) 2.5 MG TABLET    Take 1 tablet (2.5 mg total) by mouth 2 (two) times daily as needed.   MULTIPLE VITAMINS-MINERALS (MULTIVITAMIN WITH MINERALS)  TABLET    Take 2 tablets by mouth daily. WOMENS OVER 50 MVI   MUPIROCIN OINTMENT (BACTROBAN) 2 %    Apply topically as needed.   OMEPRAZOLE (PRILOSEC) 40 MG CAPSULE    Take 40 mg by mouth 2 (two) times daily.   ONDANSETRON (ZOFRAN) 4 MG TABLET    Take 1 tablet (4 mg total) by mouth every 8 (eight) hours as needed for nausea or vomiting.   ONDANSETRON (ZOFRAN-ODT) 4 MG DISINTEGRATING TABLET    Take 1 tablet (4 mg total) by mouth every 8 (eight) hours as needed for nausea or vomiting.   OXYCODONE HCL 10 MG TABS    Take 1 tablet (10 mg total) by mouth every 8 (eight) hours as needed (for severe pain after surgery).   QUETIAPINE (SEROQUEL) 100 MG TABLET    Take 200 mg by mouth at bedtime.    ROPINIROLE (REQUIP) 0.5 MG TABLET    Take 0.5 mg by mouth at bedtime.   ROSUVASTATIN (CRESTOR) 20 MG TABLET    Take 20 mg by mouth daily.  Modified Medications   No medications on file  Discontinued Medications   No medications on file      Past Medical History:  Diagnosis Date   ADHD (attention deficit hyperactivity disorder)    Alcohol abuse    Alcoholic pancreatitis [id number], 11/16, 7/17   Allergy    Anxiety    Arthritis    Bipolar disorder ([place]) 2    Cardiac arrest ([place]) 06-2015, 08-2016   x3- pt has pacemaker    Chronic pain    back   Complication of anesthesia    woke up during 2 surgical procedure hydterectomy and colonscopy   Depression    Diabetes mellitus without complication ([place])    Type 2   Esophageal mass 02/2019   has upper endo with [place] with biopsy   Fatty liver    Fracture of lateral malleolus of right fibula 06/2015   GERD (gastroesophageal reflux disease)    history ofHepatomegaly    Hyperlipidemia    Hypertension    Neurocardiogenic syncope    faints 2 x week   Neuromuscular disorder (HCC)    Dystonia   OCD (obsessive compulsive disorder)    OCD (obsessive compulsive disorder)    Osteoporosis    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   Pancreatic pseudocyst    Panic attack  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, 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   Syncope    neurocardiogenic   Third degree heart block (HCC)     Social History   Tobacco Use   Smoking status: Every Day    Packs/day: 1.00    Years: 15.00    Total pack years: 15.00    Types: Cigarettes   Smokeless tobacco: Never   Tobacco comments:    1/2 -1 ppd  Vaping Use   Vaping Use: Former   Substances: Nicotine  Substance Use Topics   Alcohol use: Yes    Comment: social   Drug use: No    Family History  Problem Relation Age of Onset   Stroke Mother    Kidney disease Mother    Colon polyps Mother    Hypertension Mother    Kidney failure Mother    Lung cancer Father        stage IV    Colon polyps Father    Hypertension Father    Stomach cancer Maternal Grandfather    Colon cancer Paternal Grandfather    Hypertension Sister    Hyperlipidemia Sister    Hypertension Brother    Hyperlipidemia Brother    Rectal cancer Neg Hx    Esophageal cancer Neg Hx    Pancreatic cancer Neg Hx     Allergies  Allergen Reactions   Codeine Nausea And Vomiting   Demerol [Meperidine] Other (See Comments)    Hallucinations    Hydrocodone-Acetaminophen Nausea And Vomiting    Pt reports Vicodin caused "vomiting for six hours," reports being able to tolerate oxycodone and tramadol. Can take tylenol   Jardiance [Empagliflozin] Other (See Comments)    Yeast infections   Lopressor [Metoprolol] Other (See Comments)    nightmares    ROS   OBJECTIVE:    There were no vitals filed for this visit. There is no height or weight on file to calculate BMI.  Physical Exam   Labs and Microbiology:    Latest Ref Rng & Units 07/08/2022    9:30 AM 07/02/2022    2:06 AM 05/25/2022    3:33 AM  CBC  WBC 3.8 - 10.8 Thousand/uL  6.7  5.7   Hemoglobin 12.0 - 15.0 g/dL 67.6  19.5  9.8   Hematocrit 36.0 - 46.0 % 40.0  40.7  30.5   Platelets 140 - 400 Thousand/uL  284  212        Latest Ref Rng & Units 07/08/2022    9:30 AM 05/24/2022    2:05 AM 05/23/2022    7:30 AM  CMP  Glucose 70 - 99 mg/dL 093  267  124   BUN 6 - 20 mg/dL 11  16  16    Creatinine 0.44 - 1.00 mg/dL  5.80  9.98   Sodium 135 - 145 mmol/L 137  135  136   Potassium 3.5 - 5.1 mmol/L 3.7  3.8  3.4   Chloride 98 - 111 mmol/L 97  101  100   CO2 22 - 32 mmol/L  28  28   Calcium 8.9 - 10.3 mg/dL  8.9  9.2   Total Protein 6.5 - 8.1 g/dL  5.6  6.5   Total Bilirubin 0.3 - 1.2 mg/dL  0.4  1.1   Alkaline Phos 38 - 126 U/L  95  91   AST 15 - 41 U/L  13  16   ALT 0 -  44 U/L  21  23      No results found for this or any previous visit (from the past 240 hour(s)).  Imaging: ***   ASSESSMENT & PLAN:    No problem-specific Assessment & Plan notes found for this encounter.   No orders of the defined types were placed in this encounter.    There are no diagnoses linked to this encounter.  ***   Vedia Coffer for Infectious Disease [facility] 07/20/2022, 10:28 AM

## 2022-07-20 NOTE — Telephone Encounter (Signed)
Called patient to see if she would be able to make it to today's appointment, no answer. Left HIPAA compliant voicemail requesting callback. Today was supposed to be last day of IV antibiotics.   Per Dr. Earlene Plater, he would like to evaluate patient before having PICC line removed.   Sent message to Jeri Modena, RN with [facility] asking that they leave PICC in place until patient is seen in office.   Sandie Ano, RN

## 2022-07-22 ENCOUNTER — Other Ambulatory Visit: Payer: Self-pay

## 2022-07-22 ENCOUNTER — Ambulatory Visit (INDEPENDENT_AMBULATORY_CARE_PROVIDER_SITE_OTHER): Payer: Medicare Other | Admitting: Internal Medicine

## 2022-07-22 ENCOUNTER — Encounter: Payer: Self-pay | Admitting: Internal Medicine

## 2022-07-22 VITALS — BP 133/91 | HR 87 | Resp 16 | Ht 64.0 in | Wt 171.4 lb

## 2022-07-22 DIAGNOSIS — M86061 Acute hematogenous osteomyelitis, right tibia and fibula: Secondary | ICD-10-CM | POA: Diagnosis not present

## 2022-07-22 DIAGNOSIS — T847XXD Infection and inflammatory reaction due to other internal orthopedic prosthetic devices, implants and grafts, subsequent encounter: Secondary | ICD-10-CM

## 2022-07-22 DIAGNOSIS — E1169 Type 2 diabetes mellitus with other specified complication: Secondary | ICD-10-CM

## 2022-07-22 DIAGNOSIS — F17210 Nicotine dependence, cigarettes, uncomplicated: Secondary | ICD-10-CM

## 2022-07-22 DIAGNOSIS — E1165 Type 2 diabetes mellitus with hyperglycemia: Secondary | ICD-10-CM

## 2022-07-22 DIAGNOSIS — A4901 Methicillin susceptible Staphylococcus aureus infection, unspecified site: Secondary | ICD-10-CM | POA: Diagnosis not present

## 2022-07-22 DIAGNOSIS — Z720 Tobacco use: Secondary | ICD-10-CM

## 2022-07-22 DIAGNOSIS — Z7984 Long term (current) use of oral hypoglycemic drugs: Secondary | ICD-10-CM

## 2022-07-22 DIAGNOSIS — Z452 Encounter for adjustment and management of vascular access device: Secondary | ICD-10-CM

## 2022-07-22 MED ORDER — CEFADROXIL 500 MG PO CAPS: 1000.0000 mg | ORAL_CAPSULE | Freq: Two times a day (BID) | ORAL | 0 refills | Status: AC

## 2022-07-22 NOTE — Patient Instructions (Signed)
Thank you for coming to see me today. It was a pleasure seeing you.  To Do: Remove PICC line today Start taking Cefadroxil 1gm twice per day Follow up in 4 weeks  If you have any questions or concerns, please do not hesitate to call the office at [phone].  Take Care,   Gwynn Burly

## 2022-07-22 NOTE — Assessment & Plan Note (Signed)
  Will remove PICC line today.

## 2022-07-22 NOTE — Assessment & Plan Note (Signed)
Continues on cefazolin for osteofixation infection and abscess s/p removal of hardware, debridement, and I&D on 05/23/22 with operative cultures that grew MSSA.  She returned to the OR again for repeat washout and debridement on 07/08/22.  Her OPAT labs have been stable with normal WBC and inflammatory markers as recently as 07/21/22.  At this point, will have PICC line removed and transition to cefadroxil 1gm BID for remainder of therapy.  Will plan for 6 weeks from date of her last surgery with antibiotics ending on ~ 08/18/22.  Will follow up at that time.

## 2022-07-22 NOTE — Progress Notes (Signed)
[facility]  CHIEF COMPLAINT:    Follow up for osteofixation infection  SUBJECTIVE:    Brittney Cooper is a 61 y.o. female with PMHx as below who presents to the clinic for osteofixation infection.   She has a history of right ankle fracture s/p ORIF approximately 5-6 years ago by Dr Eulah Pont.  She recently presented to the orthopedic office in May 2023 with a draining, painful lateral wound overlying the fibula.  She was taken to the OR on 05/23/22 by Dr Dion Saucier for debridement where she was found to have an abscess overlying the hardware with a small element of distal fibular OM.  She had all hardware removed at the time of her surgery.  Her operative cultures grew MSSA and she has been on cefazolin 2gm every 8 hours via PICC line since that time.    She had follow up with orthopedic surgery early this month where sutures were removed.  She then started having some drainage from her incision.  She saw Dr Eulah Pont who told her she may need to go back to the OR for a repeat washout.  She was seen by myself for follow up on 07/02/22. She had been adherent with antibiotic regimen for the most part with exception of a couple doses over the 6 weeks.  She was supposed to complete 6 weeks of IV therapy on 07/04/22 however this was extended following her visit on 07/02/22.  She did go back to the OR on 07/08/22 with Dr Eulah Pont.  She was found to have a purulent fluid collection and lateral fibular bone that appeared devitalized.  This was debrided and an osteotome was used to create a cortical window and saucerization of fibula here.  She was discharged with a Penrose drain following complex closure of the wound.  She was scheduled for follow up with myself on 07/13/22 but was unable to keep this appointment due to a schedule conflict.  She missed her subsequent follow up earlier this week.  She presents today and remains on IV cefazolin via PICC line.  She has now completed > 8 weeks  total and approximately 2 weeks since her last debridement.   She is doing okay today, reports blood sugars have been better controlled.  She is trying to cut back on her smoking. Penrose drain was removed since surgery and not having much in terms of drainage.  Please see A&P for the details of today's visit and status of the patient's medical problems.   Patient's Medications  New Prescriptions   CEFADROXIL (DURICEF) 500 MG CAPSULE    Take 2 capsules (1,000 mg total) by mouth 2 (two) times daily.  Previous Medications   ALPRAZOLAM (XANAX) 0.5 MG TABLET    Take 0.25-0.5 mg by mouth 3 (three) times daily as needed for anxiety.   AMANTADINE HCL 100 MG TABLET    Take 50-100 mg by mouth 2 (two) times daily.   AMPHETAMINE-DEXTROAMPHETAMINE (ADDERALL) 30 MG TABLET    Take 30 mg by mouth 2 (two) times daily. Does not take 2nd dose after 3pm   ASPIRIN EC 81 MG TABLET    Take 1 tablet (81 mg total) by mouth 2 (two) times daily. To prevent blood clots for 30 days after surgery.   BISOPROLOL (ZEBETA) 5 MG TABLET    TAKE 1 TABLET BY MOUTH DAILY   CALCIUM CARBONATE (OSCAL) 1500 (600 CA) MG TABS TABLET    Take by mouth  2 (two) times daily with a meal. CALTRATE 600/400 IU   DOXAZOSIN (CARDURA) 1 MG TABLET    TAKE 1 TABLET BY MOUTH AT BEDTIME   DULOXETINE (CYMBALTA) 60 MG CAPSULE    Take 60 mg by mouth in the morning.   FLECAINIDE (TAMBOCOR) 50 MG TABLET    TAKE ONE (1) TABLET BY MOUTH TWO (2) TIMES DAILY   FOLIC ACID (FOLVITE) 1 MG TABLET    Take 1 mg by mouth in the morning.   GABAPENTIN (NEURONTIN) 300 MG CAPSULE    Take 300 mg by mouth at bedtime.   GLIPIZIDE (GLUCOTROL XL) 5 MG 24 HR TABLET    Take 5 mg by mouth daily.   HYDROCODONE-ACETAMINOPHEN (NORCO) 10-325 MG TABLET    Take 1 tablet by mouth every 6 (six) hours as needed for severe pain.   LOSARTAN (COZAAR) 25 MG TABLET    TAKE ONE (1) TABLET BY MOUTH EACH DAY   MAGNESIUM GLUCONATE (MAGONATE) 500 MG TABLET    Take 500 mg by mouth daily.    MELOXICAM (MOBIC) 15 MG TABLET    Take 1 tablet (15 mg total) by mouth daily. For pain and inflammation   METFORMIN (GLUCOPHAGE) 1000 MG TABLET    Take 1,000 mg by mouth 2 (two) times daily.   MIDODRINE (PROAMATINE) 2.5 MG TABLET    Take 1 tablet (2.5 mg total) by mouth 2 (two) times daily as needed.   MULTIPLE VITAMINS-MINERALS (MULTIVITAMIN WITH MINERALS) TABLET    Take 2 tablets by mouth daily. WOMENS OVER 50 MVI   MUPIROCIN OINTMENT (BACTROBAN) 2 %    Apply topically as needed.   OMEPRAZOLE (PRILOSEC) 40 MG CAPSULE    Take 40 mg by mouth 2 (two) times daily.   ONDANSETRON (ZOFRAN) 4 MG TABLET    Take 1 tablet (4 mg total) by mouth every 8 (eight) hours as needed for nausea or vomiting.   ONDANSETRON (ZOFRAN-ODT) 4 MG DISINTEGRATING TABLET    Take 1 tablet (4 mg total) by mouth every 8 (eight) hours as needed for nausea or vomiting.   OXYCODONE HCL 10 MG TABS    Take 1 tablet (10 mg total) by mouth every 8 (eight) hours as needed (for severe pain after surgery).   QUETIAPINE (SEROQUEL) 100 MG TABLET    Take 200 mg by mouth at bedtime.    ROPINIROLE (REQUIP) 0.5 MG TABLET    Take 0.5 mg by mouth at bedtime.   ROSUVASTATIN (CRESTOR) 20 MG TABLET    Take 20 mg by mouth daily.  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   Allergy    Anxiety    Arthritis    Bipolar disorder (HCC) 2    Cardiac arrest (HCC) 06-2015, 08-2016   x3- pt has pacemaker    Chronic pain    back   Complication of anesthesia    woke up during 2 surgical procedure hydterectomy and colonscopy   Depression    Diabetes mellitus without complication (HCC)    Type 2   Esophageal mass 02/2019   has upper endo with [place] with biopsy   Fatty liver    Fracture of lateral malleolus of right fibula 06/2015   GERD  (gastroesophageal reflux disease)    history  ofHepatomegaly    Hyperlipidemia    Hypertension    Neurocardiogenic syncope    faints 2 x week   Neuromuscular disorder (HCC)    Dystonia   OCD (obsessive compulsive disorder)    OCD (obsessive compulsive disorder)    Osteoporosis    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   Pancreatic pseudocyst    Panic attack    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, 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   Syncope    neurocardiogenic   Third degree heart block (HCC)     Social History   Tobacco Use   Smoking status: Every Day    Packs/day: 1.00    Years: 15.00    Total pack years: 15.00    Types: Cigarettes   Smokeless tobacco: Never   Tobacco comments:    1/2 -1 ppd  Vaping Use   Vaping Use: Former   Substances: Nicotine  Substance Use Topics   Alcohol use: Yes    Comment: social   Drug use: No    Family History  Problem Relation Age of Onset   Stroke Mother    Kidney disease Mother    Colon polyps Mother    Hypertension Mother    Kidney failure Mother    Lung cancer Father        stage IV    Colon polyps Father    Hypertension Father    Stomach cancer Maternal Grandfather    Colon cancer Paternal Grandfather    Hypertension Sister    Hyperlipidemia Sister    Hypertension Brother    Hyperlipidemia Brother    Rectal cancer Neg Hx    Esophageal cancer Neg Hx    Pancreatic cancer Neg Hx     Allergies  Allergen Reactions   Codeine Nausea And Vomiting   Demerol [Meperidine] Other (See Comments)    Hallucinations    Hydrocodone-Acetaminophen Nausea And Vomiting    Pt reports Vicodin caused "vomiting for six hours," reports being able to tolerate oxycodone and tramadol. Can take tylenol   Jardiance [Empagliflozin] Other (See Comments)    Yeast infections   Lopressor [Metoprolol] Other (See Comments)    nightmares    Review of Systems  Constitutional: Negative.   Musculoskeletal:  Positive for  joint pain.     OBJECTIVE:    Vitals:   07/22/22 1455  BP: (!) 135/91  Pulse: 74  Resp: 16  SpO2: 94%  Weight: 171 lb 6.4 oz (77.7 kg)  Height: 5\' 4"  (1.626 m)   Body mass index is 29.42 kg/m.  Physical Exam Constitutional:      General: She is not in acute distress.    Appearance: Normal appearance.  HENT:     Head: Normocephalic and atraumatic.  Eyes:     Extraocular Movements: Extraocular movements intact.     Conjunctiva/sclera: Conjunctivae normal.  Pulmonary:     Effort: Pulmonary effort is normal. No respiratory distress.  Musculoskeletal:     Cervical back: Normal range of motion and neck supple.     Comments: PICC line in place. Right ankle incision sutures in place.  No drainage today.  Some swelling noted.  No warmth, TTP, erythema.  Skin:    General: Skin is warm and dry.     Findings: No rash.  Neurological:     General: No focal deficit present.     Mental Status: She is alert and oriented to person, place, and time.  Cranial Nerves: No cranial nerve deficit.      Labs and Microbiology:    Latest Ref Rng & Units 07/08/2022    9:30 AM 07/02/2022    2:06 AM 05/25/2022    3:33 AM  CBC  WBC 3.8 - 10.8 Thousand/uL  6.7  5.7   Hemoglobin 12.0 - 15.0 g/dL 09.8  11.9  9.8   Hematocrit 36.0 - 46.0 % 40.0  40.7  30.5   Platelets 140 - 400 Thousand/uL  284  212       Latest Ref Rng & Units 07/08/2022    9:30 AM 05/24/2022    2:05 AM 05/23/2022    7:30 AM  CMP  Glucose 70 - 99 mg/dL 147  829  562   BUN 6 - 20 mg/dL 11  16  16    Creatinine 0.44 - 1.00 mg/dL  1.30  8.65   Sodium 135 - 145 mmol/L 137  135  136   Potassium 3.5 - 5.1 mmol/L 3.7  3.8  3.4   Chloride 98 - 111 mmol/L 97  101  100   CO2 22 - 32 mmol/L  28  28   Calcium 8.9 - 10.3 mg/dL  8.9  9.2   Total Protein 6.5 - 8.1 g/dL  5.6  6.5   Total Bilirubin 0.3 - 1.2 mg/dL  0.4  1.1   Alkaline Phos 38 - 126 U/L  95  91   AST 15 - 41 U/L  13  16   ALT 0 - 44 U/L  21  23          ASSESSMENT & PLAN:    Acute hematogenous osteomyelitis of right fibula (HCC) Continues on cefazolin for osteofixation infection and abscess s/p removal of hardware, debridement, and I&D on 05/23/22 with operative cultures that grew MSSA.  She returned to the OR again for repeat washout and debridement on 07/08/22.  Her OPAT labs have been stable with normal WBC and inflammatory markers as recently as 07/21/22.  At this point, will have PICC line removed and transition to cefadroxil 1gm BID for remainder of therapy.  Will plan for 6 weeks from date of her last surgery with antibiotics ending on ~ 08/18/22.  Will follow up at that time.  Peripherally inserted central catheter (PICC) in place  Will remove PICC line today.   Tobacco abuse Cessation encouraged to help with wound healing.  Type 2 diabetes mellitus with hyperglycemia, without long-term current use of insulin (HCC) A1c was 8.3 in May 2023.  Reports her morning blood sugars have been in the 130s.  Discussed importance of glycemic control.      June 2023 for Infectious Disease [facility] 07/22/2022, 3:12 PM  I have personally spent 40 minutes involved in face-to-face and non-face-to-face activities for this patient on the day of the visit. Professional time spent includes the following activities: Preparing to see the patient (review of tests), Obtaining and/or reviewing separately obtained history (admission/discharge record), Performing a medically appropriate examination and/or evaluation , Ordering medications/tests/procedures, referring and communicating with other health care professionals, Documenting clinical information in the EMR, Independently interpreting results (not separately reported), Communicating results to the patient/family/caregiver, Counseling and educating the patient/family/caregiver and Care coordination (not separately reported).

## 2022-07-22 NOTE — Progress Notes (Signed)
PICC Removal    PICC length & location:  right basilic 34 cm Removed per verbal order from:  Dr. Earlene Plater  Blood thinners:  aspirin 81mg  per chart review Platelet count:  237 ([facility] 07/21/22)  Site assessment: Dressing clean and dry. Extremity warm and dry with palpable radial pulse. No redness, drainage, or swelling present at insertion site.   Pre-removal vital signs: 1455 (obtained by CMA) HR:  74 BP:  135/91 SpO2:  94% RA  No sutures present. Insertion site cleaned with CHG, catheter removed and petroleum dressing applied. Tip intact. Pressure held until hemostasis achieved.    Length of catheter removed:  34 cm   Provided patient with after care instructions and precautions print out (via [employer]).   Patient verbalized understanding and agreement, all questions answered. Patient tolerated procedure well and remained in clinic under the care of RN 30 minutes post removal.  Post-observation vital signs: 1543 HR:  87 BP:  133/91 SpO2:  94% RA  Notified 07/23/22, RN with [facility] pharmacy team of removal.   Jeri Modena, RN

## 2022-07-22 NOTE — Assessment & Plan Note (Signed)
Cessation encouraged to help with wound healing.

## 2022-07-22 NOTE — Assessment & Plan Note (Signed)
A1c was 8.3 in May 2023.  Reports her morning blood sugars have been in the 130s.  Discussed importance of glycemic control.

## 2022-07-22 NOTE — Progress Notes (Signed)
Remote pacemaker transmission.   

## 2022-07-26 ENCOUNTER — Ambulatory Visit (INDEPENDENT_AMBULATORY_CARE_PROVIDER_SITE_OTHER): Payer: Medicare Other | Admitting: Orthopedic Surgery

## 2022-07-26 ENCOUNTER — Ambulatory Visit (INDEPENDENT_AMBULATORY_CARE_PROVIDER_SITE_OTHER): Payer: Medicare Other

## 2022-07-26 ENCOUNTER — Encounter: Payer: Self-pay | Admitting: Orthopedic Surgery

## 2022-07-26 DIAGNOSIS — M25571 Pain in right ankle and joints of right foot: Secondary | ICD-10-CM | POA: Diagnosis not present

## 2022-07-26 DIAGNOSIS — M86271 Subacute osteomyelitis, right ankle and foot: Secondary | ICD-10-CM | POA: Diagnosis not present

## 2022-07-26 NOTE — Progress Notes (Signed)
Office Visit Note   Patient: Brittney Cooper           Date of Birth: 20-Oct-1961           MRN: [id number] Visit Date: 07/26/2022              Requested by: Cheron Schaumann., MD [address] [place],  [place] [address] PCP: Cheron Schaumann., MD  Chief Complaint  Patient presents with   Right Ankle - Pain    Osteomyelitis.  Discussed the importance of glucose control and smoking cessation.  Patient will call when she gets the MRI scan and we will schedule for surgery.  HPI: Patient is a 61 year old woman who is seen with persistent chronic osteomyelitis distal right fibula.  Patient underwent open reduction internal fixation of her right ankle in 2016.  Most recently in May she had redness and swelling the hardware was removed patient was on a PICC line currently on Duricef.  Patient states that despite current care she has had wound dehiscence.  Patient does have a pacemaker she states this is MRI compatible.  Assessment & Plan: Visit Diagnoses:  1. Pain in right ankle and joints of right foot   2. Subacute osteomyelitis of right ankle (HCC)     Plan: We will plan for revision debridement of the right ankle.  We will obtain an MRI scan to evaluate the extent of the  Follow-Up Instructions: No follow-ups on file.   Ortho Exam  Patient is alert, oriented, no adenopathy, well-dressed, normal affect, normal respiratory effort. Examination patient has a good dorsalis pedis pulse.  She has swelling and dehiscence of the distal aspect of the wound.  A Q-tip is used in this ulcer extends down to bone.  Patient's most recent hemoglobin A1c is 8.3.  Most recent sed rate is 6 and most recent C-reactive protein is 2.5.  Cultures in May showed Staph aureus.  Imaging: XR Ankle Complete Right  Result Date: 07/26/2022 Three-view radiographs of the right ankle shows lytic destructive changes of the fibula.  There are screw tracks from the previous ankle fusion that are healing well.   No images are attached to the encounter.  Labs: Lab Results  Component Value Date   HGBA1C 8.3 (H) 05/24/2022   HGBA1C 9.0 (H) 11/30/2020   HGBA1C 6.6 (H) 02/06/2017   ESRSEDRATE 6 07/02/2022   ESRSEDRATE 60 (H) 05/24/2022   CRP 2.5 07/02/2022   CRP 12.3 (H) 05/24/2022   REPTSTATUS 07/13/2022 FINAL 07/08/2022   GRAMSTAIN NO WBC SEEN NO ORGANISMS SEEN  07/08/2022   CULT  07/08/2022    No growth aerobically or anaerobically. Performed at [facility], [address] [place], [place] [address]    [facility] STAPHYLOCOCCUS AUREUS 05/23/2022     Lab Results  Component Value Date   ALBUMIN 3.0 (L) 05/24/2022   ALBUMIN 3.7 05/23/2022   ALBUMIN 3.3 (L) 12/01/2020    Lab Results  Component Value Date   MG 1.9 12/01/2020   MG 2.1 02/03/2017   No results found for: "VD25OH"  No results found for: "PREALBUMIN"    Latest Ref Rng & Units 07/08/2022    9:30 AM 07/02/2022    2:06 AM 05/25/2022    3:33 AM  CBC EXTENDED  WBC 3.8 - 10.8 Thousand/uL  6.7  5.7   RBC 3.80 - 5.10 Million/uL  4.23  3.02   Hemoglobin 12.0 - 15.0 g/dL 67.6  72.0  9.8   HCT 94.7 -  46.0 % 40.0  40.7  30.5   Platelets 140 - 400 Thousand/uL  284  212      There is no height or weight on file to calculate BMI.  Orders:  Orders Placed This Encounter  Procedures   XR Ankle Complete Right   No orders of the defined types were placed in this encounter.    Procedures: No procedures performed  Clinical Data: No additional findings.  ROS:  All other systems negative, except as noted in the HPI. Review of Systems  Objective: Vital Signs: There were no vitals taken for this visit.  Specialty Comments:  No specialty comments available.  PMFS History: Patient Active Problem List   Diagnosis Date Noted   Peripherally inserted central catheter (PICC) in place 06/08/2022   MSSA (methicillin susceptible Staphylococcus aureus) infection 06/08/2022   Acute hematogenous osteomyelitis of right  fibula (HCC) 05/25/2022   Infected hardware in right lower extremity (HCC) 05/23/2022   Tobacco abuse 11/30/2020   Hypertension 11/10/2020   Atrial tachycardia (HCC) 03/12/2020   Dysautonomia (HCC) 01/13/2020   Pacemaker 01/13/2020   Type 2 diabetes mellitus with hyperglycemia, without long-term current use of insulin (HCC) 02/09/2017   MDD (major depressive disorder), recurrent severe, 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   Complete heart block (HCC)    Palpitation 10/11/2016   Acute pancreatitis 09/15/2016   Alcohol abuse 09/15/2016   Tobacco use 09/15/2016   Anxiety 09/15/2016   Hypokalemia 09/15/2016   Pancreatitis 07/09/2016   Alcoholic pancreatitis 07/08/2016   Alcohol withdrawal (HCC) 07/08/2016   Bipolar disorder (HCC) 07/08/2016   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   Left shoulder pain 09/09/2014   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   Allergy    Anxiety    Arthritis    Bipolar disorder (HCC) 2    Cardiac arrest (HCC) 06-2015, 08-2016   x3- pt has pacemaker    Chronic pain    back   Complication of anesthesia    woke up during 2 surgical procedure hydterectomy and colonscopy   Depression    Diabetes mellitus without complication (HCC)    Type 2   Esophageal mass 02/2019   has upper endo with [place] with biopsy   Fatty liver    Fracture of lateral malleolus of right fibula 06/2015   GERD (gastroesophageal reflux disease)    history ofHepatomegaly    Hyperlipidemia    Hypertension    Neurocardiogenic syncope    faints 2 x week   Neuromuscular disorder (HCC)    Dystonia   OCD (obsessive compulsive disorder)    OCD (obsessive compulsive disorder)    Osteoporosis    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   Pancreatic pseudocyst    Panic attack    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, working on OB floor as a nurse    Syncope    neurocardiogenic   Third degree heart block (HCC)     Family History  Problem Relation Age of Onset   Stroke Mother    Kidney disease Mother    Colon polyps Mother    Hypertension Mother    Kidney failure Mother    Lung cancer Father        stage IV    Colon polyps Father    Hypertension Father    Stomach cancer Maternal Grandfather    Colon cancer  Paternal Grandfather    Hypertension Sister    Hyperlipidemia Sister    Hypertension Brother    Hyperlipidemia Brother    Rectal cancer Neg Hx    Esophageal cancer Neg Hx    Pancreatic cancer Neg Hx     Past Surgical History:  Procedure Laterality Date   ABDOMINAL HYSTERECTOMY  2009   ANTERIOR AND POSTERIOR REPAIR  2009   BONE EXCISION Right 07/08/2022   Procedure: PARTIAL FIBULA BONE EXCISION WITH I&D;  Surgeon: Sheral Apley, MD;  Location: [facility];  Service: Orthopedics;  Laterality: Right;   BUNIONECTOMY Bilateral    CESAREAN SECTION     EP IMPLANTABLE DEVICE N/A 10/12/2016   Procedure: Biotronik Pacemaker Implant;  Surgeon: Will Jorja Loa, MD;  Location: [facility];  Service: Cardiovascular;  Laterality: N/A;   finfger surgery Left    left middle fingger   FINGER SURGERY     HARDWARE REMOVAL Right 05/23/2022   Procedure: HARDWARE REMOVAL;  Surgeon: Teryl Lucy, MD;  Location: [facility] OR;  Service: Orthopedics;  Laterality: Right;   INCISION AND DRAINAGE ABSCESS Right 05/23/2022   Procedure: INCISION AND DRAINAGE ABSCESS, RIGHT ANKLE;  Surgeon: Teryl Lucy, MD;  Location: [facility] OR;  Service: Orthopedics;  Laterality: Right;   LTCS     MYRINGOTOMY  1966   ORIF ANKLE FRACTURE Right 07/25/2015   Procedure: OPEN REDUCTION INTERNAL FIXATION (ORIF) RIGHT ANKLE ;  Surgeon: Sheral Apley, MD;  Location: [facility];  Service: Orthopedics;  Laterality: Right;   RHINOPLASTY     age 68   sub mandibular cyst removal from right side of tongue  2019   TONSILLECTOMY  1966    and adenoids removed   TUBAL LIGATION  2005   Social History   Occupational History   Occupation: [employer] fundraiser  Tobacco Use   Smoking status: Every Day    Packs/day: 1.00    Years: 15.00    Total pack years: 15.00    Types: Cigarettes   Smokeless tobacco: Never   Tobacco comments:    1/2 -1 ppd  Vaping Use   Vaping Use: Former   Substances: Nicotine  Substance and Sexual Activity   Alcohol use: Yes    Comment: social   Drug use: No   Sexual activity: Not Currently    Birth control/protection: Surgical, Abstinence    Comment: Hysterectomy

## 2022-07-28 ENCOUNTER — Encounter: Payer: Self-pay | Admitting: Cardiovascular Disease

## 2022-07-28 ENCOUNTER — Telehealth: Payer: Self-pay | Admitting: Internal Medicine

## 2022-07-28 NOTE — Telephone Encounter (Signed)
Error

## 2022-07-28 NOTE — Telephone Encounter (Signed)
Liona from [facility] Radiologist states that they faxed over a clearance for pt to get a stat MRI since the pt has a pacemaker. Please advise.

## 2022-07-29 ENCOUNTER — Ambulatory Visit (INDEPENDENT_AMBULATORY_CARE_PROVIDER_SITE_OTHER): Payer: Medicare Other | Admitting: Orthopedic Surgery

## 2022-07-29 ENCOUNTER — Encounter (HOSPITAL_COMMUNITY): Payer: Self-pay | Admitting: Orthopedic Surgery

## 2022-07-29 ENCOUNTER — Encounter: Payer: Self-pay | Admitting: Orthopedic Surgery

## 2022-07-29 DIAGNOSIS — M86271 Subacute osteomyelitis, right ankle and foot: Secondary | ICD-10-CM

## 2022-07-29 NOTE — Anesthesia Preprocedure Evaluation (Addendum)
Anesthesia Evaluation  Patient identified by MRN, date of birth, ID band Patient awake    Reviewed: Allergy & Precautions, NPO status , Patient's Chart, lab work & pertinent test results  Airway Mallampati: II  TM Distance: >3 FB Neck ROM: Full    Dental no notable dental hx. (+) Poor Dentition, Dental Advisory Given, Missing   Pulmonary neg pulmonary ROS, Current Smoker and Patient abstained from smoking.,    Pulmonary exam normal breath sounds clear to auscultation       Cardiovascular hypertension, Pt. on medications Normal cardiovascular exam+ pacemaker  Rhythm:Regular Rate:Normal     Neuro/Psych PSYCHIATRIC DISORDERS Anxiety Depression Bipolar Disorder negative neurological ROS     GI/Hepatic Neg liver ROS, GERD  Medicated,  Endo/Other  diabetes, Oral Hypoglycemic Agents  Renal/GU negative Renal ROS     Musculoskeletal  (+) Arthritis ,   Abdominal   Peds  Hematology negative hematology ROS (+)   Anesthesia Other Findings Osteomyelitis Right Fibula  Reproductive/Obstetrics                            Anesthesia Physical Anesthesia Plan  ASA: 3  Anesthesia Plan: Regional   Post-op Pain Management: Regional block* and Minimal or no pain anticipated   Induction:   PONV Risk Score and Plan: Treatment may vary due to age or medical condition, Midazolam and Ondansetron  Airway Management Planned: Nasal Cannula and Natural Airway  Additional Equipment: None  Intra-op Plan:   Post-operative Plan:   Informed Consent:     Dental advisory given  Plan Discussed with: CRNA  Anesthesia Plan Comments:         Anesthesia Quick Evaluation

## 2022-07-29 NOTE — Progress Notes (Signed)
Anesthesia Chart Review: Brittney Cooper  Case: [id number] Date/Time: 07/30/22 0824   Procedure: PARTIAL EXCISION RIGHT FIBULA (Right)   Anesthesia type: Choice   Pre-op diagnosis: Osteomyelitis Right Fibula   [facility]: [facility] ROOM 05 / [facility]   Surgeons: Nadara Mustard, MD       DISCUSSION: Patient is a 61 year old female scheduled for the above procedure. History of ORIF right ankle fracture in 2016. Seen at [facility] on 05/20/22 with right ankle swelling and drainage noted the day prior.  X-ray showed lateral soft tissue edema but no fracture. Since then she has undergone excisional debridement, skin, subcutaneous tissue, bone, with debridement of fibular osteomyelitis,, right ankle, with removal of hardware on 05/23/22 by Teryl Lucy, MD and s/p partial right fibular bone excision with I&D on 07/08/22 by Margarita Rana, MD.  She had orthopedic follow-up with Dr. Lajoyce Corners on 07/29/22. They are still trying to schedule an MRI of her right ankle--awaiting EP clearance because of her PPM. In the meantime, he recommended surgical intervention of 07/30/22 with excision of the wound dehiscence and partial excision of the distal fibula, placement of Kerecis tissue graft and wound VAC, and plan for 6 weeks IV antibiotics based on C&S with ID consult.  Other history includes smoking, Bipolar disorder, OCD, anxiety with panic attacks, PTSD, alcohol abuse, alcoholic pancreatitis (2017, 2021), hepatomegaly/fatty liver, neurocardiogenic syncope/dysautonomia, CHB/sinus arrest/6 second pause (6 second pause on monitor with syncope 08/2016, s/p Biotronik dual chamber PPM 10/12/16), HLD, DM2, right ankle fracture (s/p ORIF 07/25/15).  Alcohol intake is documented as "social".   Last cardiology visit with Dr. Duke Salvia in 11/2021, but had preoperative telephonic evaluation prior on 07/07/22 by Eligha Bridegroom, NP prior to 07/08/22 surgery, stating, "Given past medical history and time since last visit, based on ACC/AHA  guidelines, SOLACE MANWARREN would be at acceptable risk for the planned procedure without further cardiovascular testing."   07/07/22 Remote PPM Transmission: "Normal device function. 2 HVR, longest 14sec, 4 AT, longest 58sec. EGM's show regular 1:1 Next remote 91 days." -  Awaiting perioperative cardiac device recommendations from EP.   Anesthesia team to evaluate on the day of surgery.    VS:  BP Readings from Last 3 Encounters:  07/22/22 (!) 133/91  07/08/22 125/86  07/02/22 (!) 135/94   Pulse Readings from Last 3 Encounters:  07/22/22 87  07/08/22 83  07/02/22 98     PROVIDERS: Cheron Schaumann., MD is PCP. Chilton Si, MD is cardiologist ([facility]).  Last visit 12/09/2021.  BP still labile and had had recurrent syncopal episodes that were not arrhythmogenic.  Already has a pacemaker.  Had tried compression socks and abdominal binder in the past without improvement.  Also tried salt loading without improvement.  Has midodrine, bisoprolol, and losartan instructions depending on what her systolic blood pressure readings are.  No renal artery stenosis on 09/04/2020 ultrasound.  She recommended referral to [facility]. Sherryl Manges, MD is EP Rudi Heap, ANP is endocrinology provider  Gwynn Burly, DO is ID   LABS: Most recent lab results include: Lab Results  Component Value Date   WBC 6.7 07/02/2022   HGB 13.6 07/08/2022   HCT 40.0 07/08/2022   PLT 284 07/02/2022   GLUCOSE 198 (H) 07/08/2022   ALT 21 05/24/2022   AST 13 (L) 05/24/2022   NA 137 07/08/2022   K 3.7 07/08/2022   CL 97 (L) 07/08/2022   CREATININE 0.40 (L) 07/08/2022   BUN 11 07/08/2022  CO2 28 05/24/2022   HGBA1C 8.3 (H) 05/24/2022    IMAGES: Xray right ankle 05/23/22: IMPRESSION: 1. Status post removal of the distal right fibula reduction hardware. Fracture is healed.   NM Solid Gastric Emptying Study 07/22/21 ([facility]): Impression: Rapid gastric emptying for the test meal used and  consumed under current medications     EKG: 12/08/2021: Normal sinus rhythm with sinus arrhythmia.  Nonspecific T wave abnormality.     CV: Cardiac event monitor 04/18/21-05/17/21: - Indication: syncope  - Duration: 30d - Findings PVCs Rare, less than 1%   PACs Rare, less than 1%    repeated VT detected by device more consistent with V pacing  - Symptoms: Syncope x 4 >> sinus  - Conclusions: No arrhythmia noted with syncope consistent with vasomotor event - Recommendations: Continued therapy     Echo 01/23/20: IMPRESSIONS   1. Left ventricular ejection fraction, by visual estimation, is 60 to  65%. The left ventricle has normal function. There is no left ventricular  hypertrophy.   2. The left ventricle has no regional wall motion abnormalities.   3. Global right ventricle has normal systolic function.The right  ventricular size is normal. No increase in right ventricular wall  thickness.   4. Left atrial size was normal.   5. Right atrial size was normal.   6. The mitral valve is normal in structure. Trivial mitral valve  regurgitation. No evidence of mitral stenosis.   7. The tricuspid valve is normal in structure.   8. The tricuspid valve is normal in structure. Tricuspid valve  regurgitation is not demonstrated.   9. The aortic valve is normal in structure. Aortic valve regurgitation is  not visualized. No evidence of aortic valve sclerosis or stenosis.  10. The pulmonic valve was normal in structure. Pulmonic valve  regurgitation is not visualized.  11. Mildly elevated pulmonary artery systolic pressure.  12. The inferior vena cava is normal in size with greater than 50%  respiratory variability, suggesting right atrial pressure of 3 mmHg.      Tilt Table 11/14/17 ([facility]): Narrative: Abnormal tilt table test post SLNTG - this reproduced her dizziness that  she has at home and was associated with a drop in BP.    Past Medical History:  Diagnosis Date   ADHD  (attention deficit hyperactivity disorder)    Alcohol abuse    Alcoholic pancreatitis 10/16, 11/16, 7/17   Allergy    Anxiety    Arthritis    Bipolar disorder (HCC) 2    Cardiac arrest (HCC) 06-2015, 08-2016   x3- pt has pacemaker    Chronic pain    back   Complication of anesthesia    woke up during 2 surgical procedure hydterectomy and colonscopy   Depression    Diabetes mellitus without complication (HCC)    Type 2   Esophageal mass 02/2019   has upper endo with [place] with biopsy   Fatty liver    Fracture of lateral malleolus of right fibula 06/2015   GERD (gastroesophageal reflux disease)    history ofHepatomegaly    Hyperlipidemia    Hypertension    Neurocardiogenic syncope    faints 2 x week   Neuromuscular disorder (HCC)    Dystonia   OCD (obsessive compulsive disorder)    OCD (obsessive compulsive disorder)    Osteoporosis    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   Pancreatic pseudocyst    Panic attack    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, working on OB floor as a nurse   Syncope    neurocardiogenic   Third degree heart block [facility])     Past Surgical History:  Procedure Laterality Date   ABDOMINAL HYSTERECTOMY  2009   ANTERIOR AND POSTERIOR REPAIR  2009   BONE EXCISION Right 07/08/2022   Procedure: PARTIAL FIBULA BONE EXCISION WITH I&D;  Surgeon: Renette Butters, MD;  Location: [place];  Service: Orthopedics;  Laterality: Right;   BUNIONECTOMY Bilateral    CESAREAN SECTION     EP IMPLANTABLE DEVICE N/A 10/12/2016   Procedure: Biotronik Pacemaker Implant;  Surgeon: Will Meredith Leeds, MD;  Location: [place] [facility];  Service: Cardiovascular;  Laterality: N/A;   finfger surgery Left    left middle fingger   FINGER SURGERY     HARDWARE REMOVAL Right 05/23/2022   Procedure: HARDWARE REMOVAL;  Surgeon: Marchia Bond, MD;  Location: [place];  Service: Orthopedics;  Laterality: Right;   INCISION AND DRAINAGE  ABSCESS Right 05/23/2022   Procedure: INCISION AND DRAINAGE ABSCESS, RIGHT ANKLE;  Surgeon: Marchia Bond, MD;  Location: [place];  Service: Orthopedics;  Laterality: Right;   LTCS     MYRINGOTOMY  1966   ORIF ANKLE FRACTURE Right 07/25/2015   Procedure: OPEN REDUCTION INTERNAL FIXATION (ORIF) RIGHT ANKLE ;  Surgeon: Renette Butters, MD;  Location: [place];  Service: Orthopedics;  Laterality: Right;   RHINOPLASTY     age 3   sub mandibular cyst removal from right side of tongue  2019   TONSILLECTOMY  1966   and adenoids removed   TUBAL LIGATION  2005    MEDICATIONS: No current facility-administered medications for this encounter.    ALPRAZolam (XANAX) 0.5 MG tablet   Amantadine HCl 100 MG tablet   amphetamine-dextroamphetamine (ADDERALL) 30 MG tablet   aspirin EC 81 MG tablet   bisoprolol (ZEBETA) 5 MG tablet   calcium carbonate (OSCAL) 1500 (600 Ca) MG TABS tablet   cefadroxil (DURICEF) 500 MG capsule   doxazosin (CARDURA) 1 MG tablet   DULoxetine (CYMBALTA) 60 MG capsule   flecainide (TAMBOCOR) 50 MG tablet   folic acid (FOLVITE) 1 MG tablet   gabapentin (NEURONTIN) 300 MG capsule   glipiZIDE (GLUCOTROL XL) 5 MG 24 hr tablet   HYDROcodone-acetaminophen (NORCO) 10-325 MG tablet   losartan (COZAAR) 25 MG tablet   magnesium gluconate (MAGONATE) 500 MG tablet   meloxicam (MOBIC) 15 MG tablet   metFORMIN (GLUCOPHAGE) 1000 MG tablet   midodrine (PROAMATINE) 2.5 MG tablet   Multiple Vitamins-Minerals (MULTIVITAMIN WITH MINERALS) tablet   mupirocin ointment (BACTROBAN) 2 %   omeprazole (PRILOSEC) 40 MG capsule   ondansetron (ZOFRAN) 4 MG tablet   ondansetron (ZOFRAN-ODT) 4 MG disintegrating tablet   Oxycodone HCl 10 MG TABS   QUEtiapine (SEROQUEL) 100 MG tablet   rOPINIRole (REQUIP) 0.5 MG tablet   rosuvastatin (CRESTOR) 20 MG tablet    Myra Gianotti, PA-C Surgical Short Stay/[facility] Phone [phone] [facility] Phone [phone] 07/29/2022  5:51 PM

## 2022-07-29 NOTE — Telephone Encounter (Signed)
Brittney Cooper has filled out the paperwork and sent it back to [place].   Patient is calling back and demanding that we send paperwork to surgeon office today. I told her I will give it to the nurse but I can not guarantee that it will be done today.

## 2022-07-29 NOTE — Progress Notes (Signed)
  Anesthesia review: Dr. Bradley Ferris aware  -------------  SDW INSTRUCTIONS:  Your procedure is scheduled on 8/4. Please report to [facility] Main Entrance "A" at 0600 A.M., and check in at the Admitting office. Call this number if you have problems the morning of surgery: [phone]   Remember: Do not eat or drink after midnight the night before your surgery   Medications to take morning of surgery with a sip of water include: ALPRAZolam Prudy Feeler) if needed Amantadine HCl  bisoprolol (ZEBETA)  cefadroxil (DURICEF)  DULoxetine (CYMBALTA) flecainide (TAMBOCOR) omeprazole (PRILOSEC)  rosuvastatin (CRESTOR)  Oxycodone HCl if needed  ** PLEASE check your blood sugar the morning of your surgery when you wake up and every 2 hours until you get to the Short Stay unit.  If your blood sugar is less than 70 mg/dL, you will need to treat for low blood sugar: Do not take insulin. Treat a low blood sugar (less than 70 mg/dL) with  cup of clear juice (cranberry or apple), 4 glucose tablets, OR glucose gel. Recheck blood sugar in 15 minutes after treatment (to make sure it is greater than 70 mg/dL). If your blood sugar is not greater than 70 mg/dL on recheck, call [phone] for further instructions. No oral diabetic medication DOS (glipizide and metformin - none)  As of today, STOP taking any Aspirin (unless otherwise instructed by your surgeon), Aleve, Naproxen, Ibuprofen, Motrin, Advil, Goody's, BC's, all herbal medications, fish oil, and all vitamins.    The Morning of Surgery Do not wear jewelry, make-up or nail polish. Do not wear lotions, powders, or perfumes, or deodorant Do not bring valuables to the hospital. [facility] is not responsible for any belongings or valuables.  If you are a smoker, DO NOT Smoke 24 hours prior to surgery  If you wear a CPAP at night please bring your mask the morning of surgery   Remember that you must have someone to transport you home after your  surgery, and remain with you for 24 hours if you are discharged the same day.  Please bring cases for contacts, glasses, hearing aids, dentures or bridgework because it cannot be worn into surgery.   Patients discharged the day of surgery will not be allowed to drive home.   Please shower the NIGHT BEFORE/MORNING OF SURGERY (use antibacterial soap like DIAL soap if possible). Wear comfortable clothes the morning of surgery. Oral Hygiene is also important to reduce your risk of infection.  Remember - BRUSH YOUR TEETH THE MORNING OF SURGERY WITH YOUR REGULAR TOOTHPASTE  Patient denies shortness of breath, fever, cough and chest pain.

## 2022-07-29 NOTE — Progress Notes (Signed)
Office Visit Note   Patient: Brittney Cooper           Date of Birth: 1960-12-30           MRN: [id number] Visit Date: 07/29/2022              Requested by: Cheron Schaumann., MD [address] [place],  [place] [address] PCP: Cheron Schaumann., MD  Chief Complaint  Patient presents with   Right Ankle - Follow-up      HPI: Patient is a 61 year old woman who presents in follow-up for her osteomyelitis of the right ankle.  We will try to get an MRI scan of the ankle and she could only have this performed at a hospital due to her pacemaker and the earliest appointment or weeks away.  Assessment & Plan: Visit Diagnoses:  1. Subacute osteomyelitis of right ankle (HCC)     Plan: We will plan for surgical intervention tomorrow with excision of the wound dehiscence partial excision of the distal fibula.  Placement of Kerecis tissue graft and a wound VAC and initiation of IV antibiotics.  Will plan for 6 weeks of IV antibiotics based on culture sensitivities.  Plan for infectious disease consult.  Follow-Up Instructions: Return in about 3 weeks (around 08/19/2022).   Ortho Exam  Patient is alert, oriented, no adenopathy, well-dressed, normal affect, normal respiratory effort. Examination patient has palpable pulses she has an ulcer that extends down to bone with dehiscence of her surgical incision.  There is no cellulitis or odor.  Radiographs show destructive bony changes of the distal fibula consistent with osteomyelitis.  Most recent hemoglobin A1c is 8.3  Imaging: No results found. No images are attached to the encounter.  Labs: Lab Results  Component Value Date   HGBA1C 8.3 (H) 05/24/2022   HGBA1C 9.0 (H) 11/30/2020   HGBA1C 6.6 (H) 02/06/2017   ESRSEDRATE 6 07/02/2022   ESRSEDRATE 60 (H) 05/24/2022   CRP 2.5 07/02/2022   CRP 12.3 (H) 05/24/2022   REPTSTATUS 07/13/2022 FINAL 07/08/2022   GRAMSTAIN NO WBC SEEN NO ORGANISMS SEEN  07/08/2022   CULT  07/08/2022    No  growth aerobically or anaerobically. Performed at [facility], [address], [place], [place] [address]    [facility] STAPHYLOCOCCUS AUREUS 05/23/2022     Lab Results  Component Value Date   ALBUMIN 3.0 (L) 05/24/2022   ALBUMIN 3.7 05/23/2022   ALBUMIN 3.3 (L) 12/01/2020    Lab Results  Component Value Date   MG 1.9 12/01/2020   MG 2.1 02/03/2017   No results found for: "VD25OH"  No results found for: "PREALBUMIN"    Latest Ref Rng & Units 07/08/2022    9:30 AM 07/02/2022    2:06 AM 05/25/2022    3:33 AM  CBC EXTENDED  WBC 3.8 - 10.8 Thousand/uL  6.7  5.7   RBC 3.80 - 5.10 Million/uL  4.23  3.02   Hemoglobin 12.0 - 15.0 g/dL 82.9  56.2  9.8   HCT 13.0 - 46.0 % 40.0  40.7  30.5   Platelets 140 - 400 Thousand/uL  284  212      There is no height or weight on file to calculate BMI.  Orders:  No orders of the defined types were placed in this encounter.  No orders of the defined types were placed in this encounter.    Procedures: No procedures performed  Clinical Data: No additional findings.  ROS:  All other  systems negative, except as noted in the HPI. Review of Systems  Objective: Vital Signs: There were no vitals taken for this visit.  Specialty Comments:  No specialty comments available.  PMFS History: Patient Active Problem List   Diagnosis Date Noted   Peripherally inserted central catheter (PICC) in place 06/08/2022   MSSA (methicillin susceptible Staphylococcus aureus) infection 06/08/2022   Acute hematogenous osteomyelitis of right fibula (HCC) 05/25/2022   Infected hardware in right lower extremity (HCC) 05/23/2022   Tobacco abuse 11/30/2020   Hypertension 11/10/2020   Atrial tachycardia (HCC) 03/12/2020   Dysautonomia (HCC) 01/13/2020   Pacemaker 01/13/2020   Type 2 diabetes mellitus with hyperglycemia, without long-term current use of insulin (HCC) 02/09/2017   MDD (major depressive disorder), recurrent severe, without psychosis  (HCC) 02/04/2017   Complete heart block (HCC)    Palpitation 10/11/2016   Acute pancreatitis 09/15/2016   Alcohol abuse 09/15/2016   Tobacco use 09/15/2016   Anxiety 09/15/2016   Hypokalemia 09/15/2016   Pancreatitis 07/09/2016   Alcoholic pancreatitis 07/08/2016   Alcohol withdrawal (HCC) 07/08/2016   Bipolar disorder (HCC) 07/08/2016   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   Left shoulder pain 09/09/2014   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   Allergy    Anxiety    Arthritis    Bipolar disorder (HCC) 2    Cardiac arrest (HCC) 06-2015, 08-2016   x3- pt has pacemaker    Chronic pain    back   Complication of anesthesia    woke up during 2 surgical procedure hydterectomy and colonscopy   Depression    Diabetes mellitus without complication (HCC)    Type 2   Esophageal mass 02/2019   has upper endo with [place] with biopsy   Fatty liver    Fracture of lateral malleolus of right fibula 06/2015   GERD (gastroesophageal reflux disease)    history ofHepatomegaly    Hyperlipidemia    Hypertension    Neurocardiogenic syncope    faints 2 x week   Neuromuscular disorder (HCC)    Dystonia   OCD (obsessive compulsive disorder)    OCD (obsessive compulsive disorder)    Osteoporosis    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   Pancreatic pseudocyst    Panic attack    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, working on OB floor as a nurse   Syncope    neurocardiogenic   Third degree heart block (HCC)     Family History  Problem Relation Age of Onset   Stroke Mother    Kidney disease Mother    Colon polyps Mother    Hypertension Mother    Kidney failure Mother    Lung cancer Father        stage IV    Colon polyps Father    Hypertension Father    Stomach cancer Maternal Grandfather    Colon cancer Paternal Grandfather    Hypertension  Sister    Hyperlipidemia Sister    Hypertension Brother    Hyperlipidemia Brother    Rectal cancer Neg Hx    Esophageal cancer Neg Hx    Pancreatic cancer Neg Hx     Past Surgical History:  Procedure Laterality Date   ABDOMINAL HYSTERECTOMY  2009   ANTERIOR AND POSTERIOR REPAIR  2009   BONE EXCISION Right 07/08/2022   Procedure: PARTIAL FIBULA BONE  EXCISION WITH I&D;  Surgeon: Sheral Apley, MD;  Location: [facility];  Service: Orthopedics;  Laterality: Right;   BUNIONECTOMY Bilateral    CESAREAN SECTION     EP IMPLANTABLE DEVICE N/A 10/12/2016   Procedure: Biotronik Pacemaker Implant;  Surgeon: Will Jorja Loa, MD;  [facility];  Service: Cardiovascular;  Laterality: N/A;   finfger surgery Left    left middle fingger   FINGER SURGERY     HARDWARE REMOVAL Right 05/23/2022   Procedure: HARDWARE REMOVAL;  Surgeon: Teryl Lucy, MD;  [facility];  Service: Orthopedics;  Laterality: Right;   INCISION AND DRAINAGE ABSCESS Right 05/23/2022   Procedure: INCISION AND DRAINAGE ABSCESS, RIGHT ANKLE;  Surgeon: Teryl Lucy, MD;  [facility];  Service: Orthopedics;  Laterality: Right;   LTCS     MYRINGOTOMY  1966   ORIF ANKLE FRACTURE Right 07/25/2015   Procedure: OPEN REDUCTION INTERNAL FIXATION (ORIF) RIGHT ANKLE ;  Surgeon: Sheral Apley, MD;  Location: [facility];  Service: Orthopedics;  Laterality: Right;   RHINOPLASTY     age 23   sub mandibular cyst removal from right side of tongue  2019   TONSILLECTOMY  1966   and adenoids removed   TUBAL LIGATION  2005   Social History   Occupational History   Occupation: [employer] fundraiser  Tobacco Use   Smoking status: Every Day    Packs/day: 1.00    Years: 15.00    Total pack years: 15.00    Types: Cigarettes   Smokeless tobacco: Never   Tobacco comments:    1/2 -1 ppd  Vaping Use   Vaping Use: Former   Substances: Nicotine  Substance and Sexual Activity   Alcohol use: Yes     Comment: social   Drug use: No   Sexual activity: Not Currently    Birth control/protection: Surgical, Abstinence    Comment: Hysterectomy

## 2022-07-30 ENCOUNTER — Inpatient Hospital Stay (HOSPITAL_COMMUNITY): Payer: Medicare Other | Admitting: Anesthesiology

## 2022-07-30 ENCOUNTER — Encounter (HOSPITAL_COMMUNITY)
Admission: RE | Disposition: A | Discharge status: Home / Self Care | Payer: Self-pay | Source: Home / Self Care | Attending: Orthopedic Surgery

## 2022-07-30 ENCOUNTER — Other Ambulatory Visit: Payer: Self-pay

## 2022-07-30 ENCOUNTER — Encounter: Payer: Self-pay | Admitting: Internal Medicine

## 2022-07-30 ENCOUNTER — Encounter (HOSPITAL_COMMUNITY): Payer: Self-pay | Admitting: Orthopedic Surgery

## 2022-07-30 ENCOUNTER — Inpatient Hospital Stay (HOSPITAL_COMMUNITY)
Admission: RE | Admit: 2022-07-30 | Discharge: 2022-07-31 | DRG: 982 | Disposition: A | Discharge status: Home / Self Care | Payer: Medicare Other | Attending: Orthopedic Surgery | Admitting: Orthopedic Surgery

## 2022-07-30 DIAGNOSIS — F410 Panic disorder [episodic paroxysmal anxiety] without agoraphobia: Secondary | ICD-10-CM | POA: Diagnosis present

## 2022-07-30 DIAGNOSIS — E1169 Type 2 diabetes mellitus with other specified complication: Secondary | ICD-10-CM | POA: Diagnosis present

## 2022-07-30 DIAGNOSIS — Z7984 Long term (current) use of oral hypoglycemic drugs: Secondary | ICD-10-CM

## 2022-07-30 DIAGNOSIS — Z79899 Other long term (current) drug therapy: Secondary | ICD-10-CM

## 2022-07-30 DIAGNOSIS — Z885 Allergy status to narcotic agent status: Secondary | ICD-10-CM

## 2022-07-30 DIAGNOSIS — M869 Osteomyelitis, unspecified: Secondary | ICD-10-CM

## 2022-07-30 DIAGNOSIS — Z8249 Family history of ischemic heart disease and other diseases of the circulatory system: Secondary | ICD-10-CM

## 2022-07-30 DIAGNOSIS — Z823 Family history of stroke: Secondary | ICD-10-CM

## 2022-07-30 DIAGNOSIS — F17210 Nicotine dependence, cigarettes, uncomplicated: Secondary | ICD-10-CM | POA: Diagnosis present

## 2022-07-30 DIAGNOSIS — F4310 Post-traumatic stress disorder, unspecified: Secondary | ICD-10-CM | POA: Diagnosis present

## 2022-07-30 DIAGNOSIS — Z7982 Long term (current) use of aspirin: Secondary | ICD-10-CM

## 2022-07-30 DIAGNOSIS — Z950 Presence of cardiac pacemaker: Secondary | ICD-10-CM | POA: Diagnosis not present

## 2022-07-30 DIAGNOSIS — Z800 Family history of malignant neoplasm of digestive organs: Secondary | ICD-10-CM

## 2022-07-30 DIAGNOSIS — E785 Hyperlipidemia, unspecified: Secondary | ICD-10-CM | POA: Diagnosis present

## 2022-07-30 DIAGNOSIS — G8929 Other chronic pain: Secondary | ICD-10-CM | POA: Diagnosis present

## 2022-07-30 DIAGNOSIS — F909 Attention-deficit hyperactivity disorder, unspecified type: Secondary | ICD-10-CM | POA: Diagnosis present

## 2022-07-30 DIAGNOSIS — Z8371 Family history of colonic polyps: Secondary | ICD-10-CM | POA: Diagnosis not present

## 2022-07-30 DIAGNOSIS — M86271 Subacute osteomyelitis, right ankle and foot: Secondary | ICD-10-CM | POA: Diagnosis present

## 2022-07-30 DIAGNOSIS — Z801 Family history of malignant neoplasm of trachea, bronchus and lung: Secondary | ICD-10-CM

## 2022-07-30 DIAGNOSIS — I442 Atrioventricular block, complete: Secondary | ICD-10-CM | POA: Diagnosis present

## 2022-07-30 DIAGNOSIS — Z8674 Personal history of sudden cardiac arrest: Secondary | ICD-10-CM | POA: Diagnosis not present

## 2022-07-30 DIAGNOSIS — I10 Essential (primary) hypertension: Secondary | ICD-10-CM | POA: Diagnosis present

## 2022-07-30 DIAGNOSIS — K219 Gastro-esophageal reflux disease without esophagitis: Secondary | ICD-10-CM | POA: Diagnosis present

## 2022-07-30 DIAGNOSIS — Z888 Allergy status to other drugs, medicaments and biological substances status: Secondary | ICD-10-CM

## 2022-07-30 DIAGNOSIS — M86261 Subacute osteomyelitis, right tibia and fibula: Secondary | ICD-10-CM | POA: Diagnosis not present

## 2022-07-30 DIAGNOSIS — M549 Dorsalgia, unspecified: Secondary | ICD-10-CM | POA: Diagnosis present

## 2022-07-30 DIAGNOSIS — Z8349 Family history of other endocrine, nutritional and metabolic diseases: Secondary | ICD-10-CM

## 2022-07-30 DIAGNOSIS — Z841 Family history of disorders of kidney and ureter: Secondary | ICD-10-CM | POA: Diagnosis not present

## 2022-07-30 DIAGNOSIS — F429 Obsessive-compulsive disorder, unspecified: Secondary | ICD-10-CM | POA: Diagnosis present

## 2022-07-30 DIAGNOSIS — Z791 Long term (current) use of non-steroidal anti-inflammatories (NSAID): Secondary | ICD-10-CM

## 2022-07-30 HISTORY — PX: I & D EXTREMITY: SHX5045

## 2022-07-30 LAB — BASIC METABOLIC PANEL
Anion gap: 12 (ref 5–15)
BUN: 21 mg/dL — ABNORMAL HIGH (ref 6–20)
CO2: 29 mmol/L (ref 22–32)
Calcium: 10 mg/dL (ref 8.9–10.3)
Chloride: 97 mmol/L — ABNORMAL LOW (ref 98–111)
Creatinine, Ser: 0.88 mg/dL (ref 0.44–1.00)
GFR, Estimated: >60 mL/min (ref >60)
Glucose, Bld: 245 mg/dL — ABNORMAL HIGH (ref 70–99)
Potassium: 3.7 mmol/L (ref 3.5–5.1)
Sodium: 138 mmol/L (ref 135–145)

## 2022-07-30 LAB — GLUCOSE, CAPILLARY
Glucose-Capillary: 264 mg/dL — ABNORMAL HIGH (ref 70–99)
Glucose-Capillary: 120 mg/dL — ABNORMAL HIGH (ref 70–99)
Glucose-Capillary: 117 mg/dL — ABNORMAL HIGH (ref 70–99)
Glucose-Capillary: 101 mg/dL — ABNORMAL HIGH (ref 70–99)

## 2022-07-30 SURGERY — IRRIGATION AND DEBRIDEMENT EXTREMITY
Anesthesia: Regional | Laterality: Right

## 2022-07-30 MED ORDER — ACETAMINOPHEN 325 MG PO TABS: 325.0000 mg | ORAL_TABLET | Freq: Four times a day (QID) | ORAL | Status: DC | PRN

## 2022-07-30 MED ORDER — DOXAZOSIN MESYLATE 1 MG PO TABS
1.0000 mg | ORAL_TABLET | Freq: Every day | ORAL | Status: DC
  Administered 2022-07-30: 1 mg via ORAL
  Filled 2022-07-30 (×3): qty 1

## 2022-07-30 MED ORDER — HYDROMORPHONE HCL 1 MG/ML IJ SOLN
0.5000 mg | INTRAMUSCULAR | Status: DC | PRN
  Administered 2022-07-31: 1 mg via INTRAVENOUS
  Filled 2022-07-30: qty 1

## 2022-07-30 MED ORDER — FENTANYL CITRATE (PF) 100 MCG/2ML IJ SOLN
INTRAMUSCULAR | Status: AC
  Administered 2022-07-30: 50 ug via INTRAVENOUS
  Filled 2022-07-30: qty 2

## 2022-07-30 MED ORDER — THIAMINE HCL 100 MG PO TABS
100.0000 mg | ORAL_TABLET | Freq: Every day | ORAL | Status: DC
  Administered 2022-07-30 – 2022-07-31 (×2): 100 mg via ORAL
  Filled 2022-07-30 (×2): qty 1

## 2022-07-30 MED ORDER — MIDAZOLAM HCL 2 MG/2ML IJ SOLN
INTRAMUSCULAR | Status: AC
  Administered 2022-07-30: 1 mg via INTRAVENOUS
  Filled 2022-07-30: qty 2

## 2022-07-30 MED ORDER — CEFAZOLIN SODIUM-DEXTROSE 2-4 GM/100ML-% IV SOLN
2.0000 g | INTRAVENOUS | Status: AC
  Administered 2022-07-30: 2 g via INTRAVENOUS
  Filled 2022-07-30: qty 100

## 2022-07-30 MED ORDER — LORAZEPAM 1 MG PO TABS: 1.0000 mg | ORAL_TABLET | ORAL | Status: DC | PRN

## 2022-07-30 MED ORDER — GABAPENTIN 300 MG PO CAPS
300.0000 mg | ORAL_CAPSULE | Freq: Every day | ORAL | Status: DC
  Administered 2022-07-30: 300 mg via ORAL
  Filled 2022-07-30: qty 1

## 2022-07-30 MED ORDER — ONDANSETRON HCL 4 MG/2ML IJ SOLN
INTRAMUSCULAR | Status: AC
  Filled 2022-07-30: qty 2

## 2022-07-30 MED ORDER — ONDANSETRON HCL 4 MG PO TABS: 4.0000 mg | ORAL_TABLET | Freq: Four times a day (QID) | ORAL | Status: DC | PRN

## 2022-07-30 MED ORDER — MIDAZOLAM HCL 2 MG/2ML IJ SOLN: 1.0000 mg | INTRAMUSCULAR | Status: DC | PRN

## 2022-07-30 MED ORDER — METHOCARBAMOL 1000 MG/10ML IJ SOLN: 500.0000 mg | Freq: Four times a day (QID) | INTRAVENOUS | Status: DC | PRN

## 2022-07-30 MED ORDER — THIAMINE HCL 100 MG/ML IJ SOLN
100.0000 mg | Freq: Every day | INTRAMUSCULAR | Status: DC
  Filled 2022-07-30 (×2): qty 1

## 2022-07-30 MED ORDER — METHOCARBAMOL 500 MG PO TABS
500.0000 mg | ORAL_TABLET | Freq: Four times a day (QID) | ORAL | Status: DC | PRN
  Administered 2022-07-30 – 2022-07-31 (×2): 500 mg via ORAL
  Filled 2022-07-30 (×2): qty 1

## 2022-07-30 MED ORDER — LORAZEPAM 2 MG/ML IJ SOLN: 1.0000 mg | INTRAMUSCULAR | Status: DC | PRN

## 2022-07-30 MED ORDER — FENTANYL CITRATE (PF) 100 MCG/2ML IJ SOLN: 50.0000 ug | Freq: Once | INTRAMUSCULAR | Status: AC

## 2022-07-30 MED ORDER — OXYCODONE HCL 5 MG PO TABS
10.0000 mg | ORAL_TABLET | ORAL | Status: DC | PRN
  Administered 2022-07-30 – 2022-07-31 (×2): 10 mg via ORAL
  Filled 2022-07-30 (×2): qty 2

## 2022-07-30 MED ORDER — ONDANSETRON HCL 4 MG/2ML IJ SOLN
INTRAMUSCULAR | Status: DC | PRN
  Administered 2022-07-30: 4 mg via INTRAVENOUS

## 2022-07-30 MED ORDER — INSULIN ASPART 100 UNIT/ML IJ SOLN: 0.0000 [IU] | INTRAMUSCULAR | Status: DC | PRN

## 2022-07-30 MED ORDER — ONDANSETRON HCL 4 MG/2ML IJ SOLN: 4.0000 mg | Freq: Four times a day (QID) | INTRAMUSCULAR | Status: DC | PRN

## 2022-07-30 MED ORDER — CLONIDINE HCL (ANALGESIA) 100 MCG/ML EP SOLN
EPIDURAL | Status: DC | PRN
  Administered 2022-07-30: 100 ug

## 2022-07-30 MED ORDER — ROSUVASTATIN CALCIUM 20 MG PO TABS
20.0000 mg | ORAL_TABLET | Freq: Every day | ORAL | Status: DC
  Administered 2022-07-30: 20 mg via ORAL
  Filled 2022-07-30: qty 1

## 2022-07-30 MED ORDER — BUPIVACAINE-EPINEPHRINE (PF) 0.5% -1:200000 IJ SOLN
INTRAMUSCULAR | Status: DC | PRN
  Administered 2022-07-30: 15 mL via PERINEURAL

## 2022-07-30 MED ORDER — OXYCODONE HCL 5 MG PO TABS: 5.0000 mg | ORAL_TABLET | ORAL | Status: DC | PRN

## 2022-07-30 MED ORDER — ALPRAZOLAM 0.5 MG PO TABS: 0.2500 mg | ORAL_TABLET | Freq: Three times a day (TID) | ORAL | Status: DC | PRN

## 2022-07-30 MED ORDER — PROPOFOL 10 MG/ML IV BOLUS
INTRAVENOUS | Status: DC | PRN
  Administered 2022-07-30: 30 mg via INTRAVENOUS
  Administered 2022-07-30: 15 mg via INTRAVENOUS

## 2022-07-30 MED ORDER — AMPHETAMINE-DEXTROAMPHET ER 10 MG PO CP24
30.0000 mg | ORAL_CAPSULE | Freq: Every day | ORAL | Status: DC
  Administered 2022-07-31: 30 mg via ORAL
  Filled 2022-07-30: qty 3

## 2022-07-30 MED ORDER — FLECAINIDE ACETATE 50 MG PO TABS
50.0000 mg | ORAL_TABLET | Freq: Two times a day (BID) | ORAL | Status: DC
  Administered 2022-07-30 – 2022-07-31 (×3): 50 mg via ORAL
  Filled 2022-07-30 (×5): qty 1

## 2022-07-30 MED ORDER — DIPHENHYDRAMINE HCL 12.5 MG/5ML PO ELIX: 12.5000 mg | ORAL_SOLUTION | ORAL | Status: DC | PRN

## 2022-07-30 MED ORDER — DOCUSATE SODIUM 100 MG PO CAPS
100.0000 mg | ORAL_CAPSULE | Freq: Two times a day (BID) | ORAL | Status: DC
  Administered 2022-07-30 – 2022-07-31 (×2): 100 mg via ORAL
  Filled 2022-07-30 (×2): qty 1

## 2022-07-30 MED ORDER — POLYETHYLENE GLYCOL 3350 17 G PO PACK: 17.0000 g | PACK | Freq: Every day | ORAL | Status: DC | PRN

## 2022-07-30 MED ORDER — GLIPIZIDE ER 5 MG PO TB24
5.0000 mg | ORAL_TABLET | Freq: Every day | ORAL | Status: DC
  Administered 2022-07-30 – 2022-07-31 (×2): 5 mg via ORAL
  Filled 2022-07-30 (×2): qty 1

## 2022-07-30 MED ORDER — ROPIVACAINE HCL 5 MG/ML IJ SOLN
INTRAMUSCULAR | Status: DC | PRN
  Administered 2022-07-30: 30 mL via PERINEURAL

## 2022-07-30 MED ORDER — CEFAZOLIN SODIUM-DEXTROSE 1-4 GM/50ML-% IV SOLN
1.0000 g | Freq: Four times a day (QID) | INTRAVENOUS | Status: DC
  Administered 2022-07-30 – 2022-07-31 (×5): 1 g via INTRAVENOUS
  Filled 2022-07-30 (×5): qty 50

## 2022-07-30 MED ORDER — ADULT MULTIVITAMIN W/MINERALS CH
1.0000 | ORAL_TABLET | Freq: Every day | ORAL | Status: DC
  Administered 2022-07-30 – 2022-07-31 (×2): 1 via ORAL
  Filled 2022-07-30 (×2): qty 1

## 2022-07-30 MED ORDER — LOSARTAN POTASSIUM 50 MG PO TABS: 25.0000 mg | ORAL_TABLET | Freq: Every day | ORAL | Status: DC | PRN

## 2022-07-30 MED ORDER — METFORMIN HCL 500 MG PO TABS
1000.0000 mg | ORAL_TABLET | Freq: Two times a day (BID) | ORAL | Status: DC
  Administered 2022-07-30 – 2022-07-31 (×2): 1000 mg via ORAL
  Filled 2022-07-30 (×2): qty 2

## 2022-07-30 MED ORDER — CHLORHEXIDINE GLUCONATE 0.12 % MT SOLN
OROMUCOSAL | Status: AC
  Administered 2022-07-30: 15 mL via OROMUCOSAL
  Filled 2022-07-30: qty 15

## 2022-07-30 MED ORDER — INSULIN ASPART 100 UNIT/ML IJ SOLN
INTRAMUSCULAR | Status: AC
  Administered 2022-07-30: 8 [IU] via SUBCUTANEOUS
  Filled 2022-07-30: qty 1

## 2022-07-30 MED ORDER — BISACODYL 10 MG RE SUPP: 10.0000 mg | Freq: Every day | RECTAL | Status: DC | PRN

## 2022-07-30 MED ORDER — DULOXETINE HCL 60 MG PO CPEP
60.0000 mg | ORAL_CAPSULE | Freq: Every morning | ORAL | Status: DC
  Administered 2022-07-30 – 2022-07-31 (×2): 60 mg via ORAL
  Filled 2022-07-30 (×2): qty 1

## 2022-07-30 MED ORDER — PROPOFOL 500 MG/50ML IV EMUL
INTRAVENOUS | Status: DC | PRN
  Administered 2022-07-30: 130 ug/kg/min via INTRAVENOUS

## 2022-07-30 MED ORDER — NICOTINE 7 MG/24HR TD PT24
7.0000 mg | MEDICATED_PATCH | Freq: Every day | TRANSDERMAL | Status: DC
  Filled 2022-07-30 (×2): qty 1

## 2022-07-30 MED ORDER — MIDAZOLAM HCL 2 MG/2ML IJ SOLN
INTRAMUSCULAR | Status: AC
  Filled 2022-07-30: qty 2

## 2022-07-30 MED ORDER — SODIUM CHLORIDE 0.9 % IV SOLN: INTRAVENOUS | Status: DC

## 2022-07-30 MED ORDER — ORAL CARE MOUTH RINSE: 15.0000 mL | Freq: Once | OROMUCOSAL | Status: AC

## 2022-07-30 MED ORDER — METOCLOPRAMIDE HCL 5 MG/ML IJ SOLN: 5.0000 mg | Freq: Three times a day (TID) | INTRAMUSCULAR | Status: DC | PRN

## 2022-07-30 MED ORDER — ASPIRIN 81 MG PO TBEC
81.0000 mg | DELAYED_RELEASE_TABLET | Freq: Every day | ORAL | Status: DC
  Administered 2022-07-30 – 2022-07-31 (×2): 81 mg via ORAL
  Filled 2022-07-30 (×2): qty 1

## 2022-07-30 MED ORDER — PROPOFOL 1000 MG/100ML IV EMUL
INTRAVENOUS | Status: AC
  Filled 2022-07-30: qty 100

## 2022-07-30 MED ORDER — METOCLOPRAMIDE HCL 5 MG PO TABS: 5.0000 mg | ORAL_TABLET | Freq: Three times a day (TID) | ORAL | Status: DC | PRN

## 2022-07-30 MED ORDER — CHLORHEXIDINE GLUCONATE 0.12 % MT SOLN: 15.0000 mL | Freq: Once | OROMUCOSAL | Status: AC

## 2022-07-30 MED ORDER — MIDAZOLAM HCL 2 MG/2ML IJ SOLN
INTRAMUSCULAR | Status: DC | PRN
  Administered 2022-07-30: 2 mg via INTRAVENOUS

## 2022-07-30 MED ORDER — SODIUM CHLORIDE 0.9 % IR SOLN
Status: DC | PRN
  Administered 2022-07-30: 1000 mL

## 2022-07-30 MED ORDER — FOLIC ACID 1 MG PO TABS
1.0000 mg | ORAL_TABLET | Freq: Every day | ORAL | Status: DC
  Administered 2022-07-30 – 2022-07-31 (×2): 1 mg via ORAL
  Filled 2022-07-30 (×2): qty 1

## 2022-07-30 MED ORDER — LACTATED RINGERS IV SOLN: INTRAVENOUS | Status: DC

## 2022-07-30 MED ORDER — QUETIAPINE FUMARATE 100 MG PO TABS
100.0000 mg | ORAL_TABLET | Freq: Every day | ORAL | Status: DC
  Administered 2022-07-30: 100 mg via ORAL
  Filled 2022-07-30: qty 1

## 2022-07-30 MED ORDER — LIDOCAINE 2% (20 MG/ML) 5 ML SYRINGE
INTRAMUSCULAR | Status: AC
  Filled 2022-07-30: qty 5

## 2022-07-30 SURGICAL SUPPLY — 43 items
BAG COUNTER SPONGE SURGICOUNT (BAG) ×1 IMPLANT
BAG SPNG CNTER NS LX DISP (BAG) ×1
BLADE AVERAGE 25X9 (BLADE) ×1 IMPLANT
BLADE SURG 21 STRL SS (BLADE) ×3 IMPLANT
BNDG CMPR 5X4 CHSV STRCH STRL (GAUZE/BANDAGES/DRESSINGS) ×1
BNDG COHESIVE 4X5 TAN STRL LF (GAUZE/BANDAGES/DRESSINGS) ×1 IMPLANT
BNDG COHESIVE 6X5 TAN NS LF (GAUZE/BANDAGES/DRESSINGS) ×1 IMPLANT
BNDG COHESIVE 6X5 TAN STRL LF (GAUZE/BANDAGES/DRESSINGS) IMPLANT
BNDG GAUZE ELAST 4 BULKY (GAUZE/BANDAGES/DRESSINGS) ×4 IMPLANT
CNTNR URN SCR LID CUP LEK RST (MISCELLANEOUS) IMPLANT
CONT SPEC 4OZ STRL OR WHT (MISCELLANEOUS) ×2
COVER SURGICAL LIGHT HANDLE (MISCELLANEOUS) ×6 IMPLANT
DRAPE DERMATAC (DRAPES) ×2 IMPLANT
DRAPE U-SHAPE 47X51 STRL (DRAPES) ×3 IMPLANT
DRESSING PEEL AND PLC PRVNA 13 (GAUZE/BANDAGES/DRESSINGS) IMPLANT
DRSG ADAPTIC 3X8 NADH LF (GAUZE/BANDAGES/DRESSINGS) ×2 IMPLANT
DRSG PEEL AND PLACE PREVENA 13 (GAUZE/BANDAGES/DRESSINGS) ×2
DURAPREP 26ML APPLICATOR (WOUND CARE) ×3 IMPLANT
ELECT REM PT RETURN 9FT ADLT (ELECTROSURGICAL) ×2
ELECTRODE REM PT RTRN 9FT ADLT (ELECTROSURGICAL) IMPLANT
GAUZE SPONGE 4X4 12PLY STRL (GAUZE/BANDAGES/DRESSINGS) ×3 IMPLANT
GLOVE BIOGEL PI IND STRL 9 (GLOVE) ×2 IMPLANT
GLOVE BIOGEL PI INDICATOR 9 (GLOVE) ×1
GLOVE SURG ORTHO 9.0 STRL STRW (GLOVE) ×3 IMPLANT
GOWN STRL REUS W/ TWL XL LVL3 (GOWN DISPOSABLE) ×4 IMPLANT
GOWN STRL REUS W/TWL XL LVL3 (GOWN DISPOSABLE) ×4
GRAFT SKIN WND MICRO 38 (Tissue) ×1 IMPLANT
HANDPIECE INTERPULSE COAX TIP (DISPOSABLE)
KIT BASIN OR (CUSTOM PROCEDURE TRAY) ×3 IMPLANT
KIT DRSG PREVENA PLUS 7DAY 125 (MISCELLANEOUS) ×1 IMPLANT
KIT TURNOVER KIT B (KITS) ×3 IMPLANT
MANIFOLD NEPTUNE II (INSTRUMENTS) ×3 IMPLANT
NS IRRIG 1000ML POUR BTL (IV SOLUTION) ×3 IMPLANT
PACK ORTHO EXTREMITY (CUSTOM PROCEDURE TRAY) ×3 IMPLANT
PAD ARMBOARD 7.5X6 YLW CONV (MISCELLANEOUS) ×6 IMPLANT
SET HNDPC FAN SPRY TIP SCT (DISPOSABLE) IMPLANT
STOCKINETTE IMPERVIOUS 9X36 MD (GAUZE/BANDAGES/DRESSINGS) IMPLANT
SUT ETHILON 2 0 PSLX (SUTURE) ×4 IMPLANT
SWAB COLLECTION DEVICE MRSA (MISCELLANEOUS) ×3 IMPLANT
SWAB CULTURE ESWAB REG 1ML (MISCELLANEOUS) IMPLANT
TOWEL GREEN STERILE (TOWEL DISPOSABLE) ×3 IMPLANT
TUBE CONNECTING 12X1/4 (SUCTIONS) ×3 IMPLANT
YANKAUER SUCT BULB TIP NO VENT (SUCTIONS) ×3 IMPLANT

## 2022-07-30 NOTE — H&P (Signed)
Brittney Cooper is an 61 y.o. female.   Chief Complaint: Ulceration chronic osteomyelitis right distal fibula. HPI: Patient is a 61 year old woman with chronic osteomyelitis distal right fibula.  Patient initially underwent open reduction internal fixation she has had hardware removal as well as serial debridements with persistent infection right distal fibula.  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   Allergy    Anxiety    Arthritis    Bipolar disorder (HCC) 2    Cardiac arrest (HCC) 06-2015, 08-2016   x3- pt has pacemaker    Chronic pain    back   Complication of anesthesia    woke up during 2 surgical procedure hydterectomy and colonscopy   Depression    Diabetes mellitus without complication (HCC)    Type 2   Esophageal mass 02/2019   has upper endo with [place] with biopsy   Fatty liver    Fracture of lateral malleolus of right fibula 06/2015   GERD (gastroesophageal reflux disease)    history ofHepatomegaly    Hyperlipidemia    Hypertension    Neurocardiogenic syncope    faints 2 x week   Neuromuscular disorder (HCC)    Dystonia   OCD (obsessive compulsive disorder)    OCD (obsessive compulsive disorder)    Osteoporosis    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   Pancreatic pseudocyst    Panic attack    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, working on OB floor as a nurse   Syncope    neurocardiogenic   Third degree heart block (HCC)     Past Surgical History:  Procedure Laterality Date   ABDOMINAL HYSTERECTOMY  2009   ANTERIOR AND POSTERIOR REPAIR  2009   BONE EXCISION Right 07/08/2022   Procedure: PARTIAL FIBULA BONE EXCISION WITH I&D;  Surgeon: Sheral Apley, MD;  Location: [facility];  Service: Orthopedics;  Laterality: Right;   BUNIONECTOMY Bilateral    CESAREAN SECTION     EP IMPLANTABLE DEVICE N/A  10/12/2016   Procedure: Biotronik Pacemaker Implant;  Surgeon: Will Jorja Loa, MD;  Location: [facility];  Service: Cardiovascular;  Laterality: N/A;   finfger surgery Left    left middle fingger   FINGER SURGERY     HARDWARE REMOVAL Right 05/23/2022   Procedure: HARDWARE REMOVAL;  Surgeon: Teryl Lucy, MD;  Location: [facility] OR;  Service: Orthopedics;  Laterality: Right;   INCISION AND DRAINAGE ABSCESS Right 05/23/2022   Procedure: INCISION AND DRAINAGE ABSCESS, RIGHT ANKLE;  Surgeon: Teryl Lucy, MD;  Location: [facility] OR;  Service: Orthopedics;  Laterality: Right;   LTCS     MYRINGOTOMY  1966   ORIF ANKLE FRACTURE Right 07/25/2015   Procedure: OPEN REDUCTION INTERNAL FIXATION (ORIF) RIGHT ANKLE ;  Surgeon: Sheral Apley, MD;  Location: [facility];  Service: Orthopedics;  Laterality: Right;   RHINOPLASTY     age 75   sub mandibular cyst removal from right side of tongue  2019   TONSILLECTOMY  1966   and adenoids removed   TUBAL LIGATION  2005    Family History  Problem Relation Age of Onset   Stroke Mother    Kidney disease Mother    Colon polyps Mother    Hypertension Mother    Kidney failure Mother    Lung cancer Father  stage IV    Colon polyps Father    Hypertension Father    Stomach cancer Maternal Grandfather    Colon cancer Paternal Grandfather    Hypertension Sister    Hyperlipidemia Sister    Hypertension Brother    Hyperlipidemia Brother    Rectal cancer Neg Hx    Esophageal cancer Neg Hx    Pancreatic cancer Neg Hx    Social History:  reports that she has been smoking cigarettes. She has a 15.00 pack-year smoking history. She has never used smokeless tobacco. She reports current alcohol use. She reports that she does not use drugs.  Allergies:  Allergies  Allergen Reactions   Codeine Nausea And Vomiting   Demerol [Meperidine] Other (See Comments)    Hallucinations    Hydrocodone-Acetaminophen Nausea And Vomiting    Pt  reports Vicodin caused "vomiting for six hours," reports being able to tolerate oxycodone and tramadol. Can take tylenol   Jardiance [Empagliflozin] Other (See Comments)    Yeast infections   Lopressor [Metoprolol] Other (See Comments)    nightmares    Medications Prior to Admission  Medication Sig Dispense Refill   ALPRAZolam (XANAX) 0.5 MG tablet Take 0.25-0.5 mg by mouth 3 (three) times daily as needed for anxiety.     Amantadine HCl 100 MG tablet Take 50-100 mg by mouth 2 (two) times daily.     amphetamine-dextroamphetamine (ADDERALL) 30 MG tablet Take 30 mg by mouth 2 (two) times daily. Does not take 2nd dose after 3pm     aspirin EC 81 MG tablet Take 1 tablet (81 mg total) by mouth 2 (two) times daily. To prevent blood clots for 30 days after surgery. 60 tablet 0   bisoprolol (ZEBETA) 5 MG tablet TAKE 1 TABLET BY MOUTH DAILY (Patient taking differently: Take 5 mg by mouth daily as needed (for systolic bp greater than 131.).) 90 tablet 3   calcium carbonate (OSCAL) 1500 (600 Ca) MG TABS tablet Take by mouth 2 (two) times daily with a meal. CALTRATE 600/400 IU     cefadroxil (DURICEF) 500 MG capsule Take 2 capsules (1,000 mg total) by mouth 2 (two) times daily. 120 capsule 0   doxazosin (CARDURA) 1 MG tablet TAKE 1 TABLET BY MOUTH AT BEDTIME (Patient taking differently: Take 1 mg by mouth at bedtime.) 90 tablet 1   DULoxetine (CYMBALTA) 60 MG capsule Take 60 mg by mouth in the morning.     flecainide (TAMBOCOR) 50 MG tablet TAKE ONE (1) TABLET BY MOUTH TWO (2) TIMES DAILY (Patient taking differently: Take 50 mg by mouth 2 (two) times daily.) 180 tablet 1   folic acid (FOLVITE) 1 MG tablet Take 1 mg by mouth in the morning.     gabapentin (NEURONTIN) 300 MG capsule Take 300 mg by mouth at bedtime.     glipiZIDE (GLUCOTROL XL) 5 MG 24 hr tablet Take 5 mg by mouth daily.     HYDROcodone-acetaminophen (NORCO) 10-325 MG tablet Take 1 tablet by mouth every 6 (six) hours as needed for severe pain.  28 tablet 0   losartan (COZAAR) 25 MG tablet TAKE ONE (1) TABLET BY MOUTH EACH DAY (Patient taking differently: Take 25 mg by mouth daily as needed (high blood pressure).) 90 tablet 1   magnesium gluconate (MAGONATE) 500 MG tablet Take 500 mg by mouth daily.     meloxicam (MOBIC) 15 MG tablet Take 1 tablet (15 mg total) by mouth daily. For pain and inflammation 30 tablet 0   metFORMIN (  GLUCOPHAGE) 1000 MG tablet Take 1,000 mg by mouth 2 (two) times daily.     midodrine (PROAMATINE) 2.5 MG tablet Take 1 tablet (2.5 mg total) by mouth 2 (two) times daily as needed. (Patient taking differently: Take 2.5 mg by mouth as needed (blood pressure).) 60 tablet 5   Multiple Vitamins-Minerals (MULTIVITAMIN WITH MINERALS) tablet Take 2 tablets by mouth daily. WOMENS OVER 50 MVI     mupirocin ointment (BACTROBAN) 2 % Apply topically as needed.     omeprazole (PRILOSEC) 40 MG capsule Take 40 mg by mouth 2 (two) times daily.     ondansetron (ZOFRAN) 4 MG tablet Take 1 tablet (4 mg total) by mouth every 8 (eight) hours as needed for nausea or vomiting. 10 tablet 0   ondansetron (ZOFRAN-ODT) 4 MG disintegrating tablet Take 1 tablet (4 mg total) by mouth every 8 (eight) hours as needed for nausea or vomiting. 20 tablet 0   Oxycodone HCl 10 MG TABS Take 1 tablet (10 mg total) by mouth every 8 (eight) hours as needed (for severe pain after surgery). 21 tablet 0   QUEtiapine (SEROQUEL) 100 MG tablet Take 200 mg by mouth at bedtime.      rOPINIRole (REQUIP) 0.5 MG tablet Take 0.5 mg by mouth at bedtime.     rosuvastatin (CRESTOR) 20 MG tablet Take 20 mg by mouth daily.      Results for orders placed or performed during the hospital encounter of 07/30/22 (from the past 48 hour(s))  Glucose, capillary     Status: Abnormal   Collection Time: 07/30/22  6:39 AM  Result Value Ref Range   Glucose-Capillary 264 (H) 70 - 99 mg/dL    Comment: Glucose reference range applies only to samples taken after fasting for at least 8  hours.   No results found.  Review of Systems  All other systems reviewed and are negative.   Blood pressure (!) 148/95, pulse 96, temperature 98 F (36.7 C), temperature source Oral, resp. rate 20, height 5\' 4"  (1.626 m), weight 77.1 kg, SpO2 95 %. Physical Exam  Patient is alert, oriented, no adenopathy, well-dressed, normal affect, normal respiratory effort. Examination patient has palpable pulses she has an ulcer that extends down to bone with dehiscence of her surgical incision.  There is no cellulitis or odor.  Radiographs show destructive bony changes of the distal fibula consistent with osteomyelitis.  Most recent hemoglobin A1c is 8.3 Assessment/Plan 1. Subacute osteomyelitis of right ankle (HCC)       Plan: We will plan for surgical intervention tomorrow with excision of the wound dehiscence partial excision of the distal fibula.  Placement of Kerecis tissue graft and a wound VAC and initiation of IV antibiotics.  Will plan for 6 weeks of IV antibiotics based on culture sensitivities.  Plan for infectious disease consult.  , MD 07/30/2022, 6:48 AM

## 2022-07-30 NOTE — Progress Notes (Signed)
Patient was concerned about home medications not being correct in her chart. She states "I have been here multiple times and have talked to five different pharmacy techs and nobody can get it right". This RN reached out to pharmacist, who said they would send a pharmacy tech up.   Bre, pharmacy tech arrived to the floor where this RN accompanied her to patients bedside to go over medication, with them. Medication was removed from room and sent to pharmacy.   During the review of medication this RN noted that, patient was acting inebriated and starting to doze off. This RN went to reassess patient, and realized there was a faint smell of alcohol. This RN asked patient if she was feeling better about the medication being correct in her chart, and if she had any questions for her. Patient stated " No I feel better about it thank you". This RN then noted, that patient was nodding off while talking to her. Pt made joke earlier about having alcohol in here water bottle. This RN asked if she had taken anything and what was in the water bottle. Pt stated " I took one of my oxy, that are prescribed to me." RN then asked what was in the water bottle. Pt stated " the bottle says water, so its water" This RN asked if she could smell the water bottle, and patient agreed to it. RN noted a strong smell of alcohol in the bottle.   This RN educated Pt on hospital policy about having alcohol on campus, and confiscated water bottle. Patient then got very agitated, started screaming, stating she was leaving AMA, and started to get aggressive towards this RN. Charge nurse was requested to room, along with calling security. Pt still stated she was going to leave AMA. MD Lajoyce Corners was reached out to several times, with no response. On-call service were called, but by the time the call service was reached patient had been talked down, and agreed to stay.   Hospital Supervisor Helmut Muster was requested by patient who came to bedside.

## 2022-07-30 NOTE — Anesthesia Procedure Notes (Signed)
  Anesthesia Regional Block: Adductor canal block   Pre-Anesthetic Checklist: , timeout performed,  Correct Patient, Correct Site, Correct Laterality,  Correct Procedure, Correct Position, site marked,  Risks and benefits discussed,  Surgical consent,  Pre-op evaluation,  At surgeon's request and post-op pain management  Laterality: Lower and Right  Prep: chloraprep       Needles:  Injection technique: Single-shot  Needle Type: Echogenic Needle     Needle Length: 9cm  Needle Gauge: 22     Additional Needles:   Procedures:,,,, ultrasound used (permanent image in chart),,    Narrative:  Start time: 07/30/2022 7:51 AM End time: 07/30/2022 7:54 AM Injection made incrementally with aspirations every 5 mL.  Performed by: Personally  Anesthesiologist: Trevor Iha, MD  Additional Notes: Block assessed prior to surgery. Pt tolerated procedure well.

## 2022-07-30 NOTE — Progress Notes (Signed)
PERIOPERATIVE PRESCRIPTION FOR IMPLANTED CARDIAC DEVICE PROGRAMMING  Patient Information: Name:  Brittney Cooper  DOB:  08/04/1961  MRN:  [id number]    Planned Procedure:  parital excision right fibula  Surgeon:  dr. Lajoyce Corners  Date of Procedure:  07/30/22  Cautery will be used.  Position during surgery:     Please send documentation back to:  [facility] (Fax # [phone])  Device Information:  Clinic EP Physician:  Sherryl Manges, MD   Device Type:  Pacemaker Manufacturer and Phone #:  Biotronik: [phone] Pacemaker Dependent?:  No. Date of Last Device Check:  07/08/22 remote Normal Device Function?:  Yes.    Electrophysiologist's Recommendations:  Have magnet available. Provide continuous ECG monitoring when magnet is used or reprogramming is to be performed.  Procedure should not interfere with device function.  No device programming or magnet placement needed.  Per Device Clinic Standing Orders, [name], [place]  8:07 AM 07/30/2022

## 2022-07-30 NOTE — Progress Notes (Signed)
Pt had an episode of drinking from water bottle containing alcohol and taking pain meds from home per dayshift.. pt is educated on standards of hosp and doctors orders and agrees to be compliant with providers orders during hosp stay..belongings containing meds and alcohol now at pharm and others at [place] station. Pt aware.. pt admits to drinking alcohol everyday and smoking.. will reach out to on call provider for orders for pt stay

## 2022-07-30 NOTE — Op Note (Addendum)
07/30/2022  9:43 AM  PATIENT:  Brittney Cooper    PRE-OPERATIVE DIAGNOSIS:  Osteomyelitis Right Fibula  POST-OPERATIVE DIAGNOSIS:  Same  PROCEDURE:  PARTIAL EXCISION RIGHT FIBULA Local tissue rearrangement for wound closure 10 x 4 cm. Tissue and bone sent for cultures. Application of Kerecis micro powder 38 cm. Application of Prevena wound VAC 13 cm.  SURGEON:  Nadara Mustard, MD  PHYSICIAN ASSISTANT:None ANESTHESIA:   General  PREOPERATIVE INDICATIONS:  Brittney Cooper is a  61 y.o. female with a diagnosis of Osteomyelitis Right Fibula who failed conservative measures and elected for surgical management.    The risks benefits and alternatives were discussed with the patient preoperatively including but not limited to the risks of infection, bleeding, nerve injury, cardiopulmonary complications, the need for revision surgery, among others, and the patient was willing to proceed.  OPERATIVE IMPLANTS: Kerecis micro powder 38 cm.  @ENCIMAGES @  OPERATIVE FINDINGS: Patient had extensive infection with necrotic bone distal fibula.  This was resected back to bleeding viable healthy bone, tissue was sent for cultures.  OPERATIVE PROCEDURE: Patient was brought the operating room after undergoing regional anesthesia.  The right lower extremity was then prepped using DuraPrep draped into a sterile field a timeout was called.  An elliptical incision was made around the ulcerative tissue this left a wound that was 10 x 4 cm.  Subperiosteal dissection was used to create flaps.  There was sclerotic bone and cystic changes.  A saw was used to remove the sclerotic bone and a curette was used to further remove the cystic bony changes.  There was screw holes that were also curetted out.  After debridement the remaining bone had healthy viable medullary bone with good bleeding there was no sign of any residual infection.  The wound was irrigated with normal saline the bone defect and wound was filled with 38  cm of Kerecis micro powder.  Local tissue rearrangement was used to close the skin over the 10 x 4 cm defect.  Wound was closed using 2-0 nylon.  A Prevena 13 cm wound VAC was applied this had a good suction fit this was overwrapped with Coban.  Patient was taken the PACU in stable condition.  Debridement type: Excisional Debridement  Side: right  Body Location: ankle   Tools used for debridement: scalpel, curette, and rongeur  Pre-debridement Wound size (cm):   Length: 5        Width: 1     Depth: 1   Post-debridement Wound size (cm):   Length: 10        Width: 4     Depth: 1   Debridement depth beyond dead/damaged tissue down to healthy viable tissue: yes  Tissue layer involved: skin, subcutaneous tissue, muscle / fascia, bone  Nature of tissue removed: Slough, , and Non-viable tissue  Irrigation volume: 1 liter     Irrigation fluid type: Normal Saline     DISCHARGE PLANNING:  Antibiotic duration: Continue IV antibiotics awaiting culture sensitivities  Weightbearing: Weightbearing as tolerated in a cam boot  Pain medication: Opioid pathway  Dressing care/ Wound VAC: Wound VAC  Ambulatory devices: Walker or crutches  Discharge to: Admit and discharge once cultures are finalized.  Follow-up: In the office 1 week post operative.

## 2022-07-30 NOTE — Transfer of Care (Signed)
Immediate Anesthesia Transfer of Care Note  Patient: Brittney Cooper  Procedure(s) Performed: PARTIAL EXCISION RIGHT FIBULA (Right)  Patient Location: PACU  Anesthesia Type:MAC combined with regional for post-op pain  Level of Consciousness: drowsy, patient cooperative and responds to stimulation  Airway & Oxygen Therapy: Patient Spontanous Breathing  Post-op Assessment: Report given to RN and Post -op Vital signs reviewed and stable  Post vital signs: Reviewed and stable  Last Vitals:  Vitals Value Taken Time  BP    Temp    Pulse    Resp    SpO2      Last Pain:  Vitals:   07/30/22 0719  TempSrc:   PainSc: 0-No pain      Patients Stated Pain Goal: 2 (03/88/82 8003)  Complications: No notable events documented.

## 2022-07-30 NOTE — Anesthesia Procedure Notes (Signed)
Procedure Name: MAC Date/Time: 07/30/2022 8:31 AM  Performed by: Janace Litten, CRNAPre-anesthesia Checklist: Patient identified, Emergency Drugs available, Suction available and Patient being monitored Oxygen Delivery Method: Simple face mask

## 2022-07-30 NOTE — Evaluation (Signed)
Physical Therapy Evaluation Patient Details Name: Brittney Cooper MRN: [id number] DOB: 11-10-1961 Today's Date: 07/30/2022  History of Present Illness  Pt is a 60 y.o. female with h/o R fibula osteomyelitis, admitted 07/30/22 for same day partial excision of R fibula and wound vac application. Of note, recent admission 05/23/22 for R fibula debridement and hardware reduction. Other PMH includes R ankle ORIF (2016), DM, bipolar, OCD, anxiety, panic attacks, PTSD, ETOH abuse, tobacco abuse, alcoholic pancreatitis, cardiac arrest, pacemaker.   Clinical Impression  Pt presents with an overall decrease in functional mobility secondary to above. PTA, pt with intermittent use of knee scooter since RLE sx 04/2022, lives with son who assists with household tasks as needed. Reviewed educ re: precautions, positioning, and importance of mobility. Today, pt mod indep with bed<>BSC transfer, able to walk a few steps with RW, though limited since pt left cam boot at home. Pt would benefit from continued acute PT services to maximize functional mobility and independence prior to d/c home.      Recommendations for follow up therapy are one component of a multi-disciplinary discharge planning process, led by the attending physician.  Recommendations may be updated based on patient status, additional functional criteria and insurance authorization.  Follow Up Recommendations No PT follow up      Assistance Recommended at Discharge Set up Supervision/Assistance  Patient can return home with the following  A little help with bathing/dressing/bathroom;Assistance with cooking/housework;Assist for transportation;Help with stairs or ramp for entrance    Equipment Recommendations None recommended by PT  Recommendations for Other Services       Functional Status Assessment Patient has had a recent decline in their functional status and demonstrates the ability to make significant improvements in function in a reasonable and  predictable amount of time.     Precautions / Restrictions Precautions Precautions: Fall;Other (comment) Precaution Comments: RLE wound vac Restrictions Weight Bearing Restrictions: Yes RLE Weight Bearing: Weight bearing as tolerated Other Position/Activity Restrictions: in cam boot      Mobility  Bed Mobility Overal bed mobility: Modified Independent                  Transfers Overall transfer level: Modified independent Equipment used: Rolling walker (2 wheels) Transfers: Sit to/from Stand, Bed to chair/wheelchair/BSC Sit to Stand: Modified independent (Device/Increase time)   Step pivot transfers: Modified independent (Device/Increase time)       General transfer comment: pt keeping RLE TDWB since she does not have cam boot in room; mod indep hopping from bed<>BSC with RW    Ambulation/Gait Ambulation/Gait assistance: Supervision Gait Distance (Feet): 4 Feet Assistive device: Rolling walker (2 wheels) Gait Pattern/deviations: Step-to pattern, Decreased weight shift to right, Antalgic, Trunk flexed       General Gait Details: slow, antalgic gait with RW and supervision for safety/lines; deferred further distance since no cam boot and pt fatigued  Stairs            Wheelchair Mobility    Modified Rankin (Stroke Patients Only)       Balance Overall balance assessment: Needs assistance   Sitting balance-Leahy Scale: Good       Standing balance-Leahy Scale: Poor                               Pertinent Vitals/Pain Pain Assessment Pain Assessment: Faces Faces Pain Scale: Hurts a little bit Pain Location: RLE Pain Descriptors / Indicators: Discomfort, Numbness Pain Intervention(s):  Monitored during session    Home Living Family/patient expects to be discharged to:: Private residence Living Arrangements: Children Available Help at Discharge: Family;Available PRN/intermittently Type of Home: House Home Access: Stairs to  enter Entrance Stairs-Rails: None Entrance Stairs-Number of Steps: 1   Home Layout: One level Home Equipment: Cane - single Librarian, academic (2 wheels);Other (comment) (knee scooter) Additional Comments: reports son has been helping her at home, but he leaves for college in 2 wks. has pets    Prior Function Prior Level of Function : Independent/Modified Independent;Driving                     Hand Dominance   Dominant Hand: Right    Extremity/Trunk Assessment   Upper Extremity Assessment Upper Extremity Assessment: Overall WFL for tasks assessed    Lower Extremity Assessment Lower Extremity Assessment: RLE deficits/detail RLE Deficits / Details: s/p R fibula excision, reports toes still numb post-op; hip and knee strength >3/5       Communication   Communication: No difficulties  Cognition Arousal/Alertness: Awake/alert, Lethargic, Suspect due to medications Behavior During Therapy: WFL for tasks assessed/performed Overall Cognitive Status: Within Functional Limits for tasks assessed                                 General Comments: pt aware she is falling asleep, attributes to sx/medicine        General Comments General comments (skin integrity, edema, etc.): educ on portable wound vac use. pt familiar with RLE precautions, positioning, AROM and education from prior sx; pt requesting to remain on PT caseload while admitted    Exercises     Assessment/Plan    PT Assessment Patient needs continued PT services  PT Problem List Decreased strength;Decreased activity tolerance;Decreased balance;Decreased mobility;Decreased knowledge of precautions;Decreased knowledge of use of DME       PT Treatment Interventions DME instruction;Gait training;Stair training;Functional mobility training;Therapeutic activities;Therapeutic exercise;Balance training;Patient/family education    PT Goals (Current goals can be found in the Care Plan section)  Acute  Rehab PT Goals Patient Stated Goal: worried about how she will help son move into college in a couple weeks PT Goal Formulation: With patient Time For Goal Achievement: 08/13/22 Potential to Achieve Goals: Good    Frequency Min 5X/week     Co-evaluation               AM-PAC PT "6 Clicks" Mobility  Outcome Measure Help needed turning from your back to your side while in a flat bed without using bedrails?: None Help needed moving from lying on your back to sitting on the side of a flat bed without using bedrails?: None Help needed moving to and from a bed to a chair (including a wheelchair)?: None Help needed standing up from a chair using your arms (e.g., wheelchair or bedside chair)?: None Help needed to walk in hospital room?: A Little Help needed climbing 3-5 steps with a railing? : A Little 6 Click Score: 22    End of Session   Activity Tolerance: Patient tolerated treatment well;Patient limited by fatigue Patient left: in bed;with call bell/phone within reach Nurse Communication: Mobility status PT Visit Diagnosis: Other abnormalities of gait and mobility (R26.89);Pain Pain - Right/Left: Right Pain - part of body: Leg    Time: 1400-1423 PT Time Calculation (min) (ACUTE ONLY): 23 min   Charges:   PT Evaluation $PT Eval Low Complexity: 1 Low  Mabeline Caras, PT, DPT Acute Rehabilitation Services  Personal: [facility] Office: [employer] 07/30/2022, 3:46 PM

## 2022-07-30 NOTE — Progress Notes (Addendum)
Patients 1600 BP was 141/101, this RN stated that she would bring in PRN medication losartan in. Pt stated that she takes losartan differently at home and refuses to take now. MD  Lajoyce Corners made aware with no response, Pharmacist also made aware and will send tech up to help get situation resolved.

## 2022-07-30 NOTE — Anesthesia Procedure Notes (Signed)
Anesthesia Regional Block: Popliteal block   Pre-Anesthetic Checklist: , timeout performed,  Correct Patient, Correct Site, Correct Laterality,  Correct Procedure, Correct Position, site marked,  Risks and benefits discussed,  Pre-op evaluation,  At surgeon's request and post-op pain management  Laterality: Lower and Right  Prep: Maximum Sterile Barrier Precautions used, chloraprep       Needles:  Injection technique: Single-shot  Needle Type: Echogenic Needle     Needle Length: 9cm  Needle Gauge: 21     Additional Needles:   Procedures:,,,, ultrasound used (permanent image in chart),,    Narrative:  Start time: 07/30/2022 7:46 AM End time: 07/30/2022 7:51 AM Injection made incrementally with aspirations every 5 mL.  Performed by: Personally  Anesthesiologist: Trevor Iha, MD  Additional Notes: Block assessed. Patient tolerated procedure well.

## 2022-07-30 NOTE — Anesthesia Postprocedure Evaluation (Signed)
Anesthesia Post Note  Patient: Brittney Cooper  Procedure(s) Performed: PARTIAL EXCISION RIGHT FIBULA (Right)     Patient location during evaluation: PACU Anesthesia Type: Regional Level of consciousness: awake and alert Pain management: pain level controlled Vital Signs Assessment: post-procedure vital signs reviewed and stable Respiratory status: spontaneous breathing, nonlabored ventilation, respiratory function stable and patient connected to nasal cannula oxygen Cardiovascular status: stable and blood pressure returned to baseline Postop Assessment: no apparent nausea or vomiting Anesthetic complications: no   No notable events documented.  Last Vitals:  Vitals:   07/30/22 0945 07/30/22 1059  BP: (!) 119/94 121/80  Pulse: 78 81  Resp: 13 16  Temp: 36.4 C 36.5 C  SpO2: 98% 95%    Last Pain:  Vitals:   07/30/22 1059  TempSrc: Oral  PainSc:                  Trevor Iha

## 2022-07-31 MED ORDER — OXYCODONE-ACETAMINOPHEN 5-325 MG PO TABS: 1.0000 | ORAL_TABLET | ORAL | 0 refills | Status: DC | PRN

## 2022-07-31 NOTE — Discharge Summary (Signed)
Discharge Diagnoses:  Principal Problem:   Osteomyelitis of right fibula [facility] Active Problems:   Subacute osteomyelitis of right fibula [facility])   Surgeries: Procedure(s): PARTIAL EXCISION RIGHT FIBULA on 07/30/2022    Consultants:   Discharged Condition: Improved  Hospital Course: Brittney Cooper is an 61 y.o. female who was admitted 07/30/2022 with a chief complaint of osteomyelitis right fibula, with a final diagnosis of Osteomyelitis Right Fibula.  Patient was brought to the operating room on 07/30/2022 and underwent Procedure(s): PARTIAL EXCISION RIGHT FIBULA.    Patient was given perioperative antibiotics:  Anti-infectives (From admission, onward)    Start     Dose/Rate Route Frequency Ordered Stop   07/30/22 1300  ceFAZolin (ANCEF) IVPB 1 g/50 mL premix        1 g 100 mL/hr over 30 Minutes Intravenous Every 6 hours 07/30/22 1103 08/04/22 1159   07/30/22 0700  ceFAZolin (ANCEF) IVPB 2g/100 mL premix        2 g 200 mL/hr over 30 Minutes Intravenous On call to O.R. 07/30/22 3474 07/30/22 2595     .  Patient was given sequential compression devices, early ambulation, and aspirin for DVT prophylaxis.  Recent vital signs: Patient Vitals for the past 24 hrs:  BP Temp Temp src Pulse Resp SpO2  07/31/22 0759 (!) 133/96 98.6 F (37 C) -- 87 18 90 %  07/30/22 1931 (!) 131/96 98.2 F (36.8 C) Oral 92 19 96 %  07/30/22 1643 (!) 141/101 97.7 F (36.5 C) Oral 79 17 96 %  07/30/22 1059 121/80 97.7 F (36.5 C) Oral 81 16 95 %  07/30/22 0945 (!) 119/94 97.6 F (36.4 C) -- 78 13 98 %  07/30/22 0930 -- -- -- 85 18 92 %  07/30/22 0915 (!) 128/94 97.8 F (36.6 C) -- 78 17 94 %  .  Recent laboratory studies: No results found.  Discharge Medications:   Allergies as of 07/31/2022       Reactions   Codeine Nausea And Vomiting   Demerol [meperidine] Other (See Comments)   Hallucinations   Jardiance [empagliflozin] Other (See Comments)   Yeast infections   Lopressor [metoprolol] Other  (See Comments)   Nightmares    Norco [hydrocodone-acetaminophen] Nausea And Vomiting   Pt reports Vicodin caused "vomiting for six hours," reports being able to tolerate oxycodone and tramadol. Can take tylenol        Medication List     STOP taking these medications    HYDROcodone-acetaminophen 10-325 MG tablet Commonly known as: Norco   Oxycodone HCl 10 MG Tabs   traMADol 50 MG tablet Commonly known as: ULTRAM       TAKE these medications    ALPRAZolam 0.5 MG tablet Commonly known as: XANAX Take 0.25-0.5 mg by mouth 3 (three) times daily as needed for anxiety. Take a half 1.2 tablet (0.25 mg) TID prn for anxiety and if needed take 1 tablet (0.5 mg) for panic attack or sleep   amphetamine-dextroamphetamine 30 MG 24 hr capsule Commonly known as: ADDERALL XR Take 30 mg by mouth daily.   aspirin EC 81 MG tablet Take 1 tablet (81 mg total) by mouth 2 (two) times daily. To prevent blood clots for 30 days after surgery. What changed: when to take this   bisoprolol 5 MG tablet Commonly known as: ZEBETA TAKE 1 TABLET BY MOUTH DAILY What changed:  when to take this reasons to take this   cefadroxil 500 MG capsule Commonly known as: DURICEF Take 2  capsules (1,000 mg total) by mouth 2 (two) times daily. What changed: additional instructions   diphenhydrAMINE 25 MG tablet Commonly known as: BENADRYL Take 25 mg by mouth at bedtime as needed for sleep.   doxazosin 1 MG tablet Commonly known as: CARDURA TAKE 1 TABLET BY MOUTH AT BEDTIME   DULoxetine 60 MG capsule Commonly known as: CYMBALTA Take 60 mg by mouth in the morning.   flecainide 50 MG tablet Commonly known as: TAMBOCOR TAKE ONE (1) TABLET BY MOUTH TWO (2) TIMES DAILY What changed: See the new instructions.   folic acid 1 MG tablet Commonly known as: FOLVITE Take 1 mg by mouth in the morning.   gabapentin 300 MG capsule Commonly known as: NEURONTIN Take 300 mg by mouth at bedtime.   glipiZIDE 5 MG  24 hr tablet Commonly known as: GLUCOTROL XL Take 5 mg by mouth daily.   losartan 25 MG tablet Commonly known as: COZAAR TAKE ONE (1) TABLET BY MOUTH EACH DAY What changed: See the new instructions.   meloxicam 15 MG tablet Commonly known as: MOBIC Take 1 tablet (15 mg total) by mouth daily. For pain and inflammation   metFORMIN 1000 MG tablet Commonly known as: GLUCOPHAGE Take 1,000 mg by mouth 2 (two) times daily.   midodrine 2.5 MG tablet Commonly known as: PROAMATINE Take 1 tablet (2.5 mg total) by mouth 2 (two) times daily as needed. What changed:  when to take this reasons to take this   Multivitamin Adult Chew Chew 1 each by mouth daily.   mupirocin ointment 2 % Commonly known as: BACTROBAN Apply 1 Application topically daily as needed (wound care).   naproxen sodium 220 MG tablet Commonly known as: ALEVE Take 220 mg by mouth daily as needed (pain).   omeprazole 40 MG capsule Commonly known as: PRILOSEC Take 40 mg by mouth 2 (two) times daily.   ondansetron 4 MG disintegrating tablet Commonly known as: ZOFRAN-ODT Take 1 tablet (4 mg total) by mouth every 8 (eight) hours as needed for nausea or vomiting.   ondansetron 4 MG tablet Commonly known as: Zofran Take 1 tablet (4 mg total) by mouth every 8 (eight) hours as needed for nausea or vomiting.   OVER THE COUNTER MEDICATION Take 1 capsule by mouth daily. Brain Health vitamin   oxyCODONE-acetaminophen 5-325 MG tablet Commonly known as: PERCOCET/ROXICET Take 1 tablet by mouth every 4 (four) hours as needed.   pantoprazole 40 MG tablet Commonly known as: PROTONIX Take 40 mg by mouth daily.   QUEtiapine 100 MG tablet Commonly known as: SEROQUEL Take 100 mg by mouth at bedtime.   rosuvastatin 20 MG tablet Commonly known as: CRESTOR Take 20 mg by mouth daily.   ZINC PO Take 1 tablet by mouth daily.               Discharge Care Instructions  (From admission, onward)           Start      Ordered   07/31/22 0000  Weight bearing as tolerated       Comments: Weightbearing as tolerated with the cam walker boot.  Question Answer Comment  Laterality right   Extremity Lower      07/31/22 0853            Diagnostic Studies: XR Ankle Complete Right  Result Date: 07/26/2022 Three-view radiographs of the right ankle shows lytic destructive changes of the fibula.  There are screw tracks from the previous ankle fusion that are  healing well.  CUP PACEART REMOTE DEVICE CHECK  Result Date: 07/08/2022 Scheduled remote reviewed. Normal device function.  2 HVR, longest 14sec, 4 AT, longest 58sec.  EGM's show regular 1:1 Next remote 91 days. LA   Patient benefited maximally from their hospital stay and there were no complications.     Disposition: Discharge disposition: 01-Home or Self Care      Discharge Instructions     Call MD / Call 911   Complete by: As directed    If you experience chest pain or shortness of breath, CALL 911 and be transported to the hospital emergency room.  If you develope a fever above 101 F, pus (white drainage) or increased drainage or redness at the wound, or calf pain, call your surgeon's office.   Constipation Prevention   Complete by: As directed    Drink plenty of fluids.  Prune juice may be helpful.  You may use a stool softener, such as Colace (over the counter) 100 mg twice a day.  Use MiraLax (over the counter) for constipation as needed.   Diet - low sodium heart healthy   Complete by: As directed    Increase activity slowly as tolerated   Complete by: As directed    Negative Pressure Wound Therapy - Incisional   Complete by: As directed    Post-operative opioid taper instructions:   Complete by: As directed    POST-OPERATIVE OPIOID TAPER INSTRUCTIONS: It is important to wean off of your opioid medication as soon as possible. If you do not need pain medication after your surgery it is ok to stop day one. Opioids include: Codeine,  Hydrocodone(Norco, Vicodin), Oxycodone(Percocet, oxycontin) and hydromorphone amongst others.  Long term and even short term use of opiods can cause: Increased pain response Dependence Constipation Depression Respiratory depression And more.  Withdrawal symptoms can include Flu like symptoms Nausea, vomiting And more Techniques to manage these symptoms Hydrate well Eat regular healthy meals Stay active Use relaxation techniques(deep breathing, meditating, yoga) Do Not substitute Alcohol to help with tapering If you have been on opioids for less than two weeks and do not have pain than it is ok to stop all together.  Plan to wean off of opioids This plan should start within one week post op of your joint replacement. Maintain the same interval or time between taking each dose and first decrease the dose.  Cut the total daily intake of opioids by one tablet each day Next start to increase the time between doses. The last dose that should be eliminated is the evening dose.      Weight bearing as tolerated   Complete by: As directed    Weightbearing as tolerated with the cam walker boot.   Laterality: right   Extremity: Lower       Follow-up Information     Nadara Mustard, MD Follow up in 1 week(s).   Specialty: Orthopedic Surgery Contact information: [address] [place] [address] [phone]                  Signed: Nadara Mustard 07/31/2022, 8:54 AM

## 2022-07-31 NOTE — TOC Transition Note (Signed)
Transition of Care [facility]) - CM/SW Discharge Note   Patient Details  Name: Brittney Cooper MRN: [id number] Date of Birth: 24-May-1961  [facility]) CM/SW Contact:  Lawerance Sabal, RN Phone Number: 07/31/2022, 11:45 AM   Clinical Narrative:     Patient to DC to home w proveena vac, her family will transport home./ 3/1 ordered at her request, and she would like to have it delivered to her address (verified) so family does not have to wait        Patient Goals and CMS Choice        Discharge Placement                       Discharge Plan and Services                                     Social Determinants of Health (SDOH) Interventions     Readmission Risk Interventions     No data to display

## 2022-07-31 NOTE — Care Management (Signed)
    Durable Medical Equipment  (From admission, onward)           Start     Ordered   07/31/22 1138  For home use only DME 3 n 1  Once        07/31/22 1137

## 2022-07-31 NOTE — TOC CAGE-AID Note (Signed)
Transition of Care [facility]) - CAGE-AID Screening   Patient Details  Name: Brittney Cooper MRN: [id number] Date of Birth: 10/10/1961  [facility] CM/SW Contact:    Lawerance Sabal, RN Phone Number: 07/31/2022, 11:36 AM   Clinical Narrative:  Patient states she can follow up on her with counselor she has.   CAGE-AID Screening:    Have You Ever Felt You Ought to Cut Down on Your Drinking or Drug Use?: Yes Have People Annoyed You By Critizing Your Drinking Or Drug Use?: No Have You Felt Bad Or Guilty About Your Drinking Or Drug Use?: Yes Have You Ever Had a Drink or Used Drugs First Thing In The Morning to Steady Your Nerves or to Get Rid of a Hangover?: No CAGE-AID Score: 2  Substance Abuse Education Offered: Yes  Substance abuse interventions: Other (must comment) (patient declined resources, states she has a counselor outpatient that she can utilize for any additional needs.)

## 2022-07-31 NOTE — Progress Notes (Signed)
Patient ID: Brittney Cooper, female   DOB: April 02, 1961, 61 y.o.   MRN: [id number] Patient is postoperative day 1 debridement osteomyelitis right ankle.  The wound VAC pump has 100 cc.  Clear serosanguineous drainage.  Cultures are negative.  Will advance patient to the Praveena plus pump.  The hospital wound VAC shows 2 checkmarks with an excellent suction fit.  The wound VAC that came up with her did not work we will have her discharged today with a new Praveena plus pump.  She will continue her current oral antibiotics.

## 2022-08-02 ENCOUNTER — Telehealth: Payer: Self-pay | Admitting: Orthopedic Surgery

## 2022-08-02 ENCOUNTER — Encounter (HOSPITAL_COMMUNITY): Payer: Self-pay | Admitting: Orthopedic Surgery

## 2022-08-02 NOTE — Telephone Encounter (Signed)
Order pending for raised toilet seat.

## 2022-08-02 NOTE — Telephone Encounter (Signed)
Pt called requesting a referral for after surgery home health for wound care and wound vac. Pt also asking for a prescription for a raised toilet seat. Please call pt about this matter at [phone].

## 2022-08-02 NOTE — Telephone Encounter (Signed)
Pt sent Autumn a mychart message in reference to below. I replied back to pt, that we will take off her wound vac at her f/u apt on 08/05/22, no need for nursing to do wound vac changes until then. I will send an order through [employer] online for raised toilet seat.

## 2022-08-03 NOTE — Telephone Encounter (Signed)
Delivered raised toilet seat to pt this morning

## 2022-08-04 LAB — AEROBIC/ANAEROBIC CULTURE W GRAM STAIN (SURGICAL/DEEP WOUND)
Culture: NO GROWTH
Gram Stain: NONE SEEN

## 2022-08-05 ENCOUNTER — Encounter: Payer: Self-pay | Admitting: Orthopedic Surgery

## 2022-08-05 ENCOUNTER — Ambulatory Visit (INDEPENDENT_AMBULATORY_CARE_PROVIDER_SITE_OTHER): Payer: Medicare Other | Admitting: Orthopedic Surgery

## 2022-08-05 DIAGNOSIS — M86271 Subacute osteomyelitis, right ankle and foot: Secondary | ICD-10-CM

## 2022-08-05 NOTE — Progress Notes (Signed)
Office Visit Note   Patient: Brittney Cooper           Date of Birth: March 17, 1961           MRN: [id number] Visit Date: 08/05/2022              Requested by: Cheron Schaumann., MD [address] [place],  [place] [address] PCP: Cheron Schaumann., MD  Chief Complaint  Patient presents with   Right Ankle - Routine Post Op    07/30/2022 partial excision right fib      HPI: Patient is a 61 year old woman status post partial excision right fibula for subacute osteomyelitis Kerecis micro powder graft was applied.  Assessment & Plan: Visit Diagnoses:  1. Subacute osteomyelitis of right ankle (HCC)     Plan: Start Dial soap cleansing dry dressing Ace wrap fracture boot protected weightbearing.  Three-view radiographs right ankle at follow-up.  Follow-Up Instructions: Return in about 2 weeks (around 08/19/2022).   Ortho Exam  Patient is alert, oriented, no adenopathy, well-dressed, normal affect, normal respiratory effort. Examination the skin incision is well-approximated there is no redness no cellulitis no signs of infection there is some clear bloody drainage.  Imaging: No results found. No images are attached to the encounter.  Labs: Lab Results  Component Value Date   HGBA1C 8.3 (H) 05/24/2022   HGBA1C 9.0 (H) 11/30/2020   HGBA1C 6.6 (H) 02/06/2017   ESRSEDRATE 6 07/02/2022   ESRSEDRATE 60 (H) 05/24/2022   CRP 2.5 07/02/2022   CRP 12.3 (H) 05/24/2022   REPTSTATUS 08/04/2022 FINAL 07/30/2022   GRAMSTAIN NO WBC SEEN NO ORGANISMS SEEN  07/30/2022   CULT  07/30/2022    No growth aerobically or anaerobically. Performed at [facility], [address] [place], [place] [address]    [facility] STAPHYLOCOCCUS AUREUS 05/23/2022     Lab Results  Component Value Date   ALBUMIN 3.0 (L) 05/24/2022   ALBUMIN 3.7 05/23/2022   ALBUMIN 3.3 (L) 12/01/2020    Lab Results  Component Value Date   MG 1.9 12/01/2020   MG 2.1 02/03/2017   No results found for:  "VD25OH"  No results found for: "PREALBUMIN"    Latest Ref Rng & Units 07/08/2022    9:30 AM 07/02/2022    2:06 AM 05/25/2022    3:33 AM  CBC EXTENDED  WBC 3.8 - 10.8 Thousand/uL  6.7  5.7   RBC 3.80 - 5.10 Million/uL  4.23  3.02   Hemoglobin 12.0 - 15.0 g/dL 22.0  25.4  9.8   HCT 27.0 - 46.0 % 40.0  40.7  30.5   Platelets 140 - 400 Thousand/uL  284  212      There is no height or weight on file to calculate BMI.  Orders:  No orders of the defined types were placed in this encounter.  No orders of the defined types were placed in this encounter.    Procedures: No procedures performed  Clinical Data: No additional findings.  ROS:  All other systems negative, except as noted in the HPI. Review of Systems  Objective: Vital Signs: There were no vitals taken for this visit.  Specialty Comments:  No specialty comments available.  PMFS History: Patient Active Problem List   Diagnosis Date Noted   Osteomyelitis of right fibula (HCC) 07/30/2022   Subacute osteomyelitis of right fibula (HCC)    Peripherally inserted central catheter (PICC) in place 06/08/2022   MSSA (methicillin susceptible Staphylococcus aureus) infection  06/08/2022   Acute hematogenous osteomyelitis of right fibula (HCC) 05/25/2022   Infected hardware in right lower extremity (HCC) 05/23/2022   Tobacco abuse 11/30/2020   Hypertension 11/10/2020   Atrial tachycardia (HCC) 03/12/2020   Dysautonomia (HCC) 01/13/2020   Pacemaker 01/13/2020   Type 2 diabetes mellitus with hyperglycemia, without long-term current use of insulin (HCC) 02/09/2017   MDD (major depressive disorder), recurrent severe, 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   Complete heart block (HCC)    Palpitation 10/11/2016   Acute pancreatitis 09/15/2016   Alcohol abuse 09/15/2016   Tobacco use 09/15/2016   Anxiety 09/15/2016   Hypokalemia 09/15/2016   Pancreatitis 07/09/2016   Alcoholic pancreatitis 07/08/2016   Alcohol withdrawal (HCC)  07/08/2016   Bipolar disorder (HCC) 07/08/2016   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   Left shoulder pain 09/09/2014   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   Allergy    Anxiety    Arthritis    Bipolar disorder (HCC) 2    Cardiac arrest (HCC) 06-2015, 08-2016   x3- pt has pacemaker    Chronic pain    back   Complication of anesthesia    woke up during 2 surgical procedure hydterectomy and colonscopy   Depression    Diabetes mellitus without complication (HCC)    Type 2   Esophageal mass 02/2019   has upper endo with [place] with biopsy   Fatty liver    Fracture of lateral malleolus of right fibula 06/2015   GERD (gastroesophageal reflux disease)    history ofHepatomegaly    Hyperlipidemia    Hypertension    Neurocardiogenic syncope    faints 2 x week   Neuromuscular disorder (HCC)    Dystonia   OCD (obsessive compulsive disorder)    OCD (obsessive compulsive disorder)    Osteoporosis    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   Pancreatic pseudocyst    Panic attack    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, working on OB floor as a nurse   Syncope    neurocardiogenic   Third degree heart block (HCC)     Family History  Problem Relation Age of Onset   Stroke Mother    Kidney disease Mother    Colon polyps Mother    Hypertension Mother    Kidney failure Mother    Lung cancer Father        stage IV    Colon polyps Father    Hypertension Father    Stomach cancer Maternal Grandfather    Colon cancer Paternal Grandfather    Hypertension Sister    Hyperlipidemia Sister    Hypertension Brother    Hyperlipidemia Brother    Rectal cancer Neg Hx    Esophageal cancer Neg Hx    Pancreatic cancer Neg Hx     Past Surgical History:  Procedure Laterality Date   ABDOMINAL HYSTERECTOMY  2009   ANTERIOR AND POSTERIOR REPAIR  2009    BONE EXCISION Right 07/08/2022   Procedure: PARTIAL FIBULA BONE EXCISION WITH I&D;  Surgeon: Sheral Apley, MD;  Location: [facility];  Service: Orthopedics;  Laterality: Right;   BUNIONECTOMY Bilateral    CESAREAN SECTION     EP IMPLANTABLE DEVICE N/A 10/12/2016   Procedure: Biotronik Pacemaker Implant;  Surgeon: Will Jorja Loa, MD;  Location: [facility];  Service: Cardiovascular;  Laterality: N/A;   finfger surgery Left    left middle fingger   FINGER SURGERY     HARDWARE REMOVAL Right 05/23/2022   Procedure: HARDWARE REMOVAL;  Surgeon: Teryl Lucy, MD;  Location: [facility];  Service: Orthopedics;  Laterality: Right;   I & D EXTREMITY Right 07/30/2022   Procedure: PARTIAL EXCISION RIGHT FIBULA;  Surgeon: Nadara Mustard, MD;  Location[facility];  Service: Orthopedics;  Laterality: Right;   INCISION AND DRAINAGE ABSCESS Right 05/23/2022   Procedure: INCISION AND DRAINAGE ABSCESS, RIGHT ANKLE;  Surgeon: Teryl Lucy, MD;  Location: [facility];  Service: Orthopedics;  Laterality: Right;   LTCS     MYRINGOTOMY  1966   ORIF ANKLE FRACTURE Right 07/25/2015   Procedure: OPEN REDUCTION INTERNAL FIXATION (ORIF) RIGHT ANKLE ;  Surgeon: Sheral Apley, MD;  Location: [facility];  Service: Orthopedics;  Laterality: Right;   RHINOPLASTY     age 88   sub mandibular cyst removal from right side of tongue  2019   TONSILLECTOMY  1966   and adenoids removed   TUBAL LIGATION  2005   Social History   Occupational History   Occupation: Charity fundraiser  Tobacco Use   Smoking status: Every Day    Packs/day: 1.00    Years: 15.00    Total pack years: 15.00    Types: Cigarettes   Smokeless tobacco: Never   Tobacco comments:    1/2 -1 ppd  Vaping Use   Vaping Use: Former   Substances: Nicotine  Substance and Sexual Activity   Alcohol use: Yes    Comment: social   Drug use: No   Sexual activity: Not Currently    Birth control/protection: Surgical, Abstinence     Comment: Hysterectomy

## 2022-08-13 ENCOUNTER — Encounter: Payer: Self-pay | Admitting: Orthopedic Surgery

## 2022-08-13 LAB — FUNGUS CULTURE RESULT

## 2022-08-13 LAB — FUNGAL ORGANISM REFLEX

## 2022-08-13 LAB — FUNGUS CULTURE WITH STAIN

## 2022-08-16 ENCOUNTER — Ambulatory Visit: Payer: Medicare Other | Admitting: Orthopedic Surgery

## 2022-08-17 ENCOUNTER — Ambulatory Visit (INDEPENDENT_AMBULATORY_CARE_PROVIDER_SITE_OTHER): Payer: Medicare Other | Admitting: Orthopedic Surgery

## 2022-08-17 ENCOUNTER — Encounter: Payer: Self-pay | Admitting: Orthopedic Surgery

## 2022-08-17 DIAGNOSIS — M86271 Subacute osteomyelitis, right ankle and foot: Secondary | ICD-10-CM

## 2022-08-17 NOTE — Progress Notes (Signed)
Office Visit Note   Patient: Brittney Cooper           Date of Birth: Oct 17, 1961           MRN: [id number] Visit Date: 08/17/2022              Requested by: Cheron Schaumann., MD [address] [place],  [place] [address] PCP: Cheron Schaumann., MD  Chief Complaint  Patient presents with   Right Ankle - Routine Post Op    07/30/2022 partial excision right fib      HPI: Patient is a 61 year old woman who is almost 3 weeks status post partial excision right fibula.  Patient states she started to have some wound dehiscence.  Assessment & Plan: Visit Diagnoses:  1. Subacute osteomyelitis of right ankle (HCC)     Plan: Patient does have increased swelling recommend a size medium Vive compression sock to be worn around-the-clock against the wound.  Change this daily with Dial soap cleansing.  Work on muscle activation to decrease swelling as well as elevation.  Weightbearing as tolerated.  Harvest sutures at follow-up.  Follow-Up Instructions: Return in about 1 week (around 08/24/2022).   Ortho Exam  Patient is alert, oriented, no adenopathy, well-dressed, normal affect, normal respiratory effort. Examination there is some swelling with slight wound dehiscence there is no cellulitis no drainage no signs of infection.  All cultures were negative.  Imaging: No results found.   Labs: Lab Results  Component Value Date   HGBA1C 8.3 (H) 05/24/2022   HGBA1C 9.0 (H) 11/30/2020   HGBA1C 6.6 (H) 02/06/2017   ESRSEDRATE 6 07/02/2022   ESRSEDRATE 60 (H) 05/24/2022   CRP 2.5 07/02/2022   CRP 12.3 (H) 05/24/2022   REPTSTATUS 08/04/2022 FINAL 07/30/2022   GRAMSTAIN NO WBC SEEN NO ORGANISMS SEEN  07/30/2022   CULT  07/30/2022    No growth aerobically or anaerobically. Performed at [facility], [address], [place], [place] [address]    [facility] STAPHYLOCOCCUS AUREUS 05/23/2022     Lab Results  Component Value Date   ALBUMIN 3.0 (L) 05/24/2022   ALBUMIN 3.7  05/23/2022   ALBUMIN 3.3 (L) 12/01/2020    Lab Results  Component Value Date   MG 1.9 12/01/2020   MG 2.1 02/03/2017   No results found for: "VD25OH"  No results found for: "PREALBUMIN"    Latest Ref Rng & Units 07/08/2022    9:30 AM 07/02/2022    2:06 AM 05/25/2022    3:33 AM  CBC EXTENDED  WBC 3.8 - 10.8 Thousand/uL  6.7  5.7   RBC 3.80 - 5.10 Million/uL  4.23  3.02   Hemoglobin 12.0 - 15.0 g/dL 77.4  12.8  9.8   HCT 78.6 - 46.0 % 40.0  40.7  30.5   Platelets 140 - 400 Thousand/uL  284  212      There is no height or weight on file to calculate BMI.  Orders:  No orders of the defined types were placed in this encounter.  No orders of the defined types were placed in this encounter.    Procedures: No procedures performed  Clinical Data: No additional findings.  ROS:  All other systems negative, except as noted in the HPI. Review of Systems  Objective: Vital Signs: There were no vitals taken for this visit.  Specialty Comments:  No specialty comments available.  PMFS History: Patient Active Problem List   Diagnosis Date Noted   Osteomyelitis of  right fibula (HCC) 07/30/2022   Subacute osteomyelitis of right fibula (HCC)    Peripherally inserted central catheter (PICC) in place 06/08/2022   MSSA (methicillin susceptible Staphylococcus aureus) infection 06/08/2022   Acute hematogenous osteomyelitis of right fibula (HCC) 05/25/2022   Infected hardware in right lower extremity (HCC) 05/23/2022   Tobacco abuse 11/30/2020   Hypertension 11/10/2020   Atrial tachycardia (HCC) 03/12/2020   Dysautonomia (HCC) 01/13/2020   Pacemaker 01/13/2020   Type 2 diabetes mellitus with hyperglycemia, without long-term current use of insulin (HCC) 02/09/2017   MDD (major depressive disorder), recurrent severe, 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   Complete heart block (HCC)    Palpitation 10/11/2016   Acute pancreatitis 09/15/2016   Alcohol abuse 09/15/2016   Tobacco use  09/15/2016   Anxiety 09/15/2016   Hypokalemia 09/15/2016   Pancreatitis 07/09/2016   Alcoholic pancreatitis 07/08/2016   Alcohol withdrawal (HCC) 07/08/2016   Bipolar disorder (HCC) 07/08/2016   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   Left shoulder pain 09/09/2014   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   Allergy    Anxiety    Arthritis    Bipolar disorder (HCC) 2    Cardiac arrest (HCC) 06-2015, 08-2016   x3- pt has pacemaker    Chronic pain    back   Complication of anesthesia    woke up during 2 surgical procedure hydterectomy and colonscopy   Depression    Diabetes mellitus without complication (HCC)    Type 2   Esophageal mass 02/2019   has upper endo with [place] with biopsy   Fatty liver    Fracture of lateral malleolus of right fibula 06/2015   GERD (gastroesophageal reflux disease)    history ofHepatomegaly    Hyperlipidemia    Hypertension    Neurocardiogenic syncope    faints 2 x week   Neuromuscular disorder (HCC)    Dystonia   OCD (obsessive compulsive disorder)    OCD (obsessive compulsive disorder)    Osteoporosis    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   Pancreatic pseudocyst    Panic attack    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, working on OB floor as a nurse   Syncope    neurocardiogenic   Third degree heart block (HCC)     Family History  Problem Relation Age of Onset   Stroke Mother    Kidney disease Mother    Colon polyps Mother    Hypertension Mother    Kidney failure Mother    Lung cancer Father        stage IV    Colon polyps Father    Hypertension Father    Stomach cancer Maternal Grandfather    Colon cancer Paternal Grandfather    Hypertension Sister    Hyperlipidemia Sister    Hypertension Brother    Hyperlipidemia Brother    Rectal cancer Neg Hx    Esophageal cancer Neg Hx     Pancreatic cancer Neg Hx     Past Surgical History:  Procedure Laterality Date   ABDOMINAL HYSTERECTOMY  2009   ANTERIOR AND POSTERIOR REPAIR  2009   BONE EXCISION Right 07/08/2022   Procedure: PARTIAL FIBULA BONE EXCISION WITH I&D;  Surgeon: Sheral Apley, MD;  Location: [facility];  Service: Orthopedics;  Laterality: Right;   BUNIONECTOMY Bilateral    CESAREAN SECTION  EP IMPLANTABLE DEVICE N/A 10/12/2016   Procedure: Biotronik Pacemaker Implant;  Surgeon: Will Jorja Loa, MD;  Location: [facility];  Service: Cardiovascular;  Laterality: N/A;   finfger surgery Left    left middle fingger   FINGER SURGERY     HARDWARE REMOVAL Right 05/23/2022   Procedure: HARDWARE REMOVAL;  Surgeon: Teryl Lucy, MD;  Location: [facility];  Service: Orthopedics;  Laterality: Right;   I & D EXTREMITY Right 07/30/2022   Procedure: PARTIAL EXCISION RIGHT FIBULA;  Surgeon: Nadara Mustard, MD;  Location[facility];  Service: Orthopedics;  Laterality: Right;   INCISION AND DRAINAGE ABSCESS Right 05/23/2022   Procedure: INCISION AND DRAINAGE ABSCESS, RIGHT ANKLE;  Surgeon: Teryl Lucy, MD;  Location: [facility];  Service: Orthopedics;  Laterality: Right;   LTCS     MYRINGOTOMY  1966   ORIF ANKLE FRACTURE Right 07/25/2015   Procedure: OPEN REDUCTION INTERNAL FIXATION (ORIF) RIGHT ANKLE ;  Surgeon: Sheral Apley, MD;  Location: [facility];  Service: Orthopedics;  Laterality: Right;   RHINOPLASTY     age 38   sub mandibular cyst removal from right side of tongue  2019   TONSILLECTOMY  1966   and adenoids removed   TUBAL LIGATION  2005   Social History   Occupational History   Occupation: Charity fundraiser  Tobacco Use   Smoking status: Every Day    Packs/day: 1.00    Years: 15.00    Total pack years: 15.00    Types: Cigarettes   Smokeless tobacco: Never   Tobacco comments:    1/2 -1 ppd  Vaping Use   Vaping Use: Former   Substances: Nicotine  Substance and Sexual  Activity   Alcohol use: Yes    Comment: social   Drug use: No   Sexual activity: Not Currently    Birth control/protection: Surgical, Abstinence    Comment: Hysterectomy

## 2022-08-19 ENCOUNTER — Ambulatory Visit: Payer: Medicare Other | Admitting: Internal Medicine

## 2022-08-19 NOTE — Progress Notes (Deleted)
[facility]  CHIEF COMPLAINT:    Follow up for osteomyelitis  SUBJECTIVE:    Brittney Cooper is a 61 y.o. female with PMHx as below who presents to the clinic for osteomyelitis.   Patient has history of right ankle fracture s/p ORIF approximately 5-6 years ago by Dr Eulah Pont.  She presented to the orthopedic office in May 2023 with a draining, painful lateral wound overlying the fibula.    She was taken to the OR on 05/23/22 by Dr Dion Saucier for debridement where she was found to have an abscess overlying the hardware with a small element of distal fibular OM.  She had all hardware removed at the time of her surgery.  Her operative cultures grew MSSA and she was placed on cefazolin 2gm every 8 hours via PICC line.  She had follow up with orthopedic surgery (Dr Eulah Pont) in early July where sutures were removed.  She had missed some doses of antibiotics over the several weeks of therapy but for the most part reported adherence.  She was supposed to complete 6 weeks of IV therapy on 07/04/22 however this was extended and she returned to the OR 07/08/22 with Dr Eulah Pont.  At that time, she  was found to have a purulent fluid collection and lateral fibular bone that appeared devitalized.  This was debrided and an osteotome was used to create a cortical window and saucerization of fibula here.  Her OR cultures were negative.    She had follow up with me on 07/22/22 at which time she was doing okay.  Her penrose drain had been removed and she was not having much in terms of drainage.  We removed her PICC line at that time since she had received 8.5 weeks of IV antibiotics at that time.  She was placed on Cefadroxil 1gm BID.  Her inflammatory markers as of 07/21/22 were normalized.    She reports *** issues with Cefadroxil 1gm BID and reports adherence.  She unfortunately developed further dehiscence from her wound.  She saw Dr Lajoyce Corners on 07/26/22.  X-ray shoed lytic destructive changes  of the fibula.  Screw tracks from the previous ankle fusion were healing well.   She went back to the OR on 07/30/22 where was found to have extensive infection with necrotic bone of distal fibula.  Her cultures again were negative. She saw Dr Lajoyce Corners 2 days ago.  She was starting to have some wound dehiscence.  She was noted to have swelling and recommended compression sock to be worn around-the-clock.  She will follow up again next week. Her A1c was chcked on 08/09/22 by her PCP and is improved to 7.4.  Down from 8.3 in May.    Please see A&P for the details of today's visit and status of the patient's medical problems.   Patient's Medications  New Prescriptions   No medications on file  Previous Medications   ALPRAZOLAM (XANAX) 0.5 MG TABLET    Take 0.25-0.5 mg by mouth 3 (three) times daily as needed for anxiety. Take a half 1.2 tablet (0.25 mg) TID prn for anxiety and if needed take 1 tablet (0.5 mg) for panic attack or sleep   AMPHETAMINE-DEXTROAMPHETAMINE (ADDERALL XR) 30 MG 24 HR CAPSULE    Take 30 mg by mouth daily.   ASPIRIN EC 81 MG TABLET    Take 1 tablet (81 mg total) by mouth 2 (two) times daily. To prevent blood clots for 30  days after surgery.   BISOPROLOL (ZEBETA) 5 MG TABLET    TAKE 1 TABLET BY MOUTH DAILY   CEFADROXIL (DURICEF) 500 MG CAPSULE    Take 2 capsules (1,000 mg total) by mouth 2 (two) times daily.   DIPHENHYDRAMINE (BENADRYL) 25 MG TABLET    Take 25 mg by mouth at bedtime as needed for sleep.   DOXAZOSIN (CARDURA) 1 MG TABLET    TAKE 1 TABLET BY MOUTH AT BEDTIME   DULOXETINE (CYMBALTA) 60 MG CAPSULE    Take 60 mg by mouth in the morning.   FLECAINIDE (TAMBOCOR) 50 MG TABLET    TAKE ONE (1) TABLET BY MOUTH TWO (2) TIMES DAILY   FOLIC ACID (FOLVITE) 1 MG TABLET    Take 1 mg by mouth in the morning.   GABAPENTIN (NEURONTIN) 300 MG CAPSULE    Take 300 mg by mouth at bedtime.   GLIPIZIDE (GLUCOTROL XL) 5 MG 24 HR TABLET    Take 5 mg by mouth daily.   LOSARTAN (COZAAR) 25 MG  TABLET    TAKE ONE (1) TABLET BY MOUTH EACH DAY   MELOXICAM (MOBIC) 15 MG TABLET    Take 1 tablet (15 mg total) by mouth daily. For pain and inflammation   METFORMIN (GLUCOPHAGE) 1000 MG TABLET    Take 1,000 mg by mouth 2 (two) times daily.   MIDODRINE (PROAMATINE) 2.5 MG TABLET    Take 1 tablet (2.5 mg total) by mouth 2 (two) times daily as needed.   MULTIPLE VITAMINS-MINERALS (MULTIVITAMIN ADULT) CHEW    Chew 1 each by mouth daily.   MULTIPLE VITAMINS-MINERALS (ZINC PO)    Take 1 tablet by mouth daily.   MUPIROCIN OINTMENT (BACTROBAN) 2 %    Apply 1 Application topically daily as needed (wound care).   NAPROXEN SODIUM (ALEVE) 220 MG TABLET    Take 220 mg by mouth daily as needed (pain).   OMEPRAZOLE (PRILOSEC) 40 MG CAPSULE    Take 40 mg by mouth 2 (two) times daily.   ONDANSETRON (ZOFRAN) 4 MG TABLET    Take 1 tablet (4 mg total) by mouth every 8 (eight) hours as needed for nausea or vomiting.   ONDANSETRON (ZOFRAN-ODT) 4 MG DISINTEGRATING TABLET    Take 1 tablet (4 mg total) by mouth every 8 (eight) hours as needed for nausea or vomiting.   OVER THE COUNTER MEDICATION    Take 1 capsule by mouth daily. Brain Health vitamin   OXYCODONE-ACETAMINOPHEN (PERCOCET/ROXICET) 5-325 MG TABLET    Take 1 tablet by mouth every 4 (four) hours as needed.   PANTOPRAZOLE (PROTONIX) 40 MG TABLET    Take 40 mg by mouth daily.   QUETIAPINE (SEROQUEL) 100 MG TABLET    Take 100 mg by mouth at bedtime.   ROSUVASTATIN (CRESTOR) 20 MG TABLET    Take 20 mg by mouth daily.  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   Allergy    Anxiety    Arthritis    Bipolar disorder (HCC) 2    Cardiac arrest (HCC) 06-2015, 08-2016   x3- pt has pacemaker    Chronic pain    back   Complication of anesthesia    woke  up during 2 surgical procedure hydterectomy and colonscopy  Depression    Diabetes mellitus without complication (HCC)    Type 2   Esophageal mass 02/2019   has upper endo with [place] with biopsy   Fatty liver    Fracture of lateral malleolus of right fibula 06/2015   GERD (gastroesophageal reflux disease)    history ofHepatomegaly    Hyperlipidemia    Hypertension    Neurocardiogenic syncope    faints 2 x week   Neuromuscular disorder (HCC)    Dystonia   OCD (obsessive compulsive disorder)    OCD (obsessive compulsive disorder)    Osteoporosis    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   Pancreatic pseudocyst    Panic attack    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, 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   Syncope    neurocardiogenic   Third degree heart block (HCC)     Social History   Tobacco Use   Smoking status: Every Day    Packs/day: 1.00    Years: 15.00    Total pack years: 15.00    Types: Cigarettes   Smokeless tobacco: Never   Tobacco comments:    1/2 -1 ppd  Vaping Use   Vaping Use: Former   Substances: Nicotine  Substance Use Topics   Alcohol use: Yes    Comment: social   Drug use: No    Family History  Problem Relation Age of Onset   Stroke Mother    Kidney disease Mother    Colon polyps Mother    Hypertension Mother    Kidney failure Mother    Lung cancer Father        stage IV    Colon polyps Father    Hypertension Father    Stomach cancer Maternal Grandfather    Colon cancer Paternal Grandfather    Hypertension Sister    Hyperlipidemia Sister    Hypertension Brother    Hyperlipidemia Brother    Rectal cancer Neg Hx    Esophageal cancer Neg Hx    Pancreatic cancer Neg Hx     Allergies  Allergen Reactions   Codeine Nausea And Vomiting   Demerol [Meperidine] Other (See Comments)    Hallucinations    Jardiance [Empagliflozin] Other (See Comments)    Yeast infections   Lopressor [Metoprolol] Other (See  Comments)    Nightmares    Norco [Hydrocodone-Acetaminophen] Nausea And Vomiting    Pt reports Vicodin caused "vomiting for six hours," reports being able to tolerate oxycodone and tramadol. Can take tylenol    ROS   OBJECTIVE:    There were no vitals filed for this visit. There is no height or weight on file to calculate BMI.  Physical Exam   Labs and Microbiology:    Latest Ref Rng & Units 07/08/2022    9:30 AM 07/02/2022    2:06 AM 05/25/2022    3:33 AM  CBC  WBC 3.8 - 10.8 Thousand/uL  6.7  5.7   Hemoglobin 12.0 - 15.0 g/dL 16.1  09.6  9.8   Hematocrit 36.0 - 46.0 % 40.0  40.7  30.5   Platelets 140 - 400 Thousand/uL  284  212       Latest Ref Rng & Units 07/30/2022    7:12 AM 07/08/2022    9:30 AM 05/24/2022    2:05 AM  CMP  Glucose 70 - 99 mg/dL 045  409  811   BUN 6 - 20 mg/dL 21  11  16  Creatinine 0.44 - 1.00 mg/dL 4.40  1.02  7.25   Sodium 135 - 145 mmol/L 138  137  135   Potassium 3.5 - 5.1 mmol/L 3.7  3.7  3.8   Chloride 98 - 111 mmol/L 97  97  101   CO2 22 - 32 mmol/L 29   28   Calcium 8.9 - 10.3 mg/dL 36.6   8.9   Total Protein 6.5 - 8.1 g/dL   5.6   Total Bilirubin 0.3 - 1.2 mg/dL   0.4   Alkaline Phos 38 - 126 U/L   95   AST 15 - 41 U/L   13   ALT 0 - 44 U/L   21      No results found for this or any previous visit (from the past 240 hour(s)).  Imaging: ***   ASSESSMENT & PLAN:    No problem-specific Assessment & Plan notes found for this encounter.   No orders of the defined types were placed in this encounter.    There are no diagnoses linked to this encounter.  ***   Vedia Coffer for Infectious Disease [facility] 08/19/2022, 6:55 AM

## 2022-08-23 ENCOUNTER — Encounter: Payer: Self-pay | Admitting: Orthopedic Surgery

## 2022-08-23 ENCOUNTER — Ambulatory Visit (INDEPENDENT_AMBULATORY_CARE_PROVIDER_SITE_OTHER): Payer: Medicare Other | Admitting: Orthopedic Surgery

## 2022-08-23 DIAGNOSIS — M86271 Subacute osteomyelitis, right ankle and foot: Secondary | ICD-10-CM

## 2022-08-23 MED ORDER — OXYCODONE-ACETAMINOPHEN 5-325 MG PO TABS: 1.0000 | ORAL_TABLET | ORAL | 0 refills | Status: DC | PRN

## 2022-08-23 NOTE — Progress Notes (Signed)
Office Visit Note   Patient: Brittney Cooper           Date of Birth: 07/02/1961           MRN: [id number] Visit Date: 08/23/2022              Requested by: Cheron Schaumann., MD [address] [place],  [place] [address] PCP: Cheron Schaumann., MD  Chief Complaint  Patient presents with   Right Ankle - Routine Post Op    07/30/2022 partial excision right fib      HPI: Patient is a 61 year old woman status post partial excision right fibula status post infected deep retained hardware.  She is wearing the knee-high compression sock daily elevating her foot.  Assessment & Plan: Visit Diagnoses:  1. Subacute osteomyelitis of right ankle (HCC)     Plan: Continue with Dial soap cleansing the compression sock and elevation.  Refill prescription for Percocet called in.  We will harvest sutures at follow-up.  Follow-Up Instructions: Return in about 1 week (around 08/30/2022).   Ortho Exam  Patient is alert, oriented, no adenopathy, well-dressed, normal affect, normal respiratory effort. Examination there is no cellulitis there is some clear drainage the center aspect of the wound is dehisced 1 cm in length 2 mm wide.  No purulence.  Most recent cultures were negative.  Imaging: No results found. No images are attached to the encounter.  Labs: Lab Results  Component Value Date   HGBA1C 8.3 (H) 05/24/2022   HGBA1C 9.0 (H) 11/30/2020   HGBA1C 6.6 (H) 02/06/2017   ESRSEDRATE 6 07/02/2022   ESRSEDRATE 60 (H) 05/24/2022   CRP 2.5 07/02/2022   CRP 12.3 (H) 05/24/2022   REPTSTATUS 08/04/2022 FINAL 07/30/2022   GRAMSTAIN NO WBC SEEN NO ORGANISMS SEEN  07/30/2022   CULT  07/30/2022    No growth aerobically or anaerobically. Performed at [facility], [address], [place], [place] [address]    [facility] STAPHYLOCOCCUS AUREUS 05/23/2022     Lab Results  Component Value Date   ALBUMIN 3.0 (L) 05/24/2022   ALBUMIN 3.7 05/23/2022   ALBUMIN 3.3 (L) 12/01/2020    Lab  Results  Component Value Date   MG 1.9 12/01/2020   MG 2.1 02/03/2017   No results found for: "VD25OH"  No results found for: "PREALBUMIN"    Latest Ref Rng & Units 07/08/2022    9:30 AM 07/02/2022    2:06 AM 05/25/2022    3:33 AM  CBC EXTENDED  WBC 3.8 - 10.8 Thousand/uL  6.7  5.7   RBC 3.80 - 5.10 Million/uL  4.23  3.02   Hemoglobin 12.0 - 15.0 g/dL 45.8  09.9  9.8   HCT 83.3 - 46.0 % 40.0  40.7  30.5   Platelets 140 - 400 Thousand/uL  284  212      There is no height or weight on file to calculate BMI.  Orders:  No orders of the defined types were placed in this encounter.  No orders of the defined types were placed in this encounter.    Procedures: No procedures performed  Clinical Data: No additional findings.  ROS:  All other systems negative, except as noted in the HPI. Review of Systems  Objective: Vital Signs: There were no vitals taken for this visit.  Specialty Comments:  No specialty comments available.  PMFS History: Patient Active Problem List   Diagnosis Date Noted   Osteomyelitis of right fibula (HCC) 07/30/2022  Subacute osteomyelitis of right fibula (HCC)    Peripherally inserted central catheter (PICC) in place 06/08/2022   MSSA (methicillin susceptible Staphylococcus aureus) infection 06/08/2022   Acute hematogenous osteomyelitis of right fibula (HCC) 05/25/2022   Infected hardware in right lower extremity (HCC) 05/23/2022   Tobacco abuse 11/30/2020   Hypertension 11/10/2020   Atrial tachycardia (HCC) 03/12/2020   Dysautonomia (HCC) 01/13/2020   Pacemaker 01/13/2020   Type 2 diabetes mellitus with hyperglycemia, without long-term current use of insulin (HCC) 02/09/2017   MDD (major depressive disorder), recurrent severe, 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   Complete heart block (HCC)    Palpitation 10/11/2016   Acute pancreatitis 09/15/2016   Alcohol abuse 09/15/2016   Tobacco use 09/15/2016   Anxiety 09/15/2016   Hypokalemia  09/15/2016   Pancreatitis 07/09/2016   Alcoholic pancreatitis 07/08/2016   Alcohol withdrawal (HCC) 07/08/2016   Bipolar disorder (HCC) 07/08/2016   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   Left shoulder pain 09/09/2014   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   Allergy    Anxiety    Arthritis    Bipolar disorder (HCC) 2    Cardiac arrest (HCC) 06-2015, 08-2016   x3- pt has pacemaker    Chronic pain    back   Complication of anesthesia    woke up during 2 surgical procedure hydterectomy and colonscopy   Depression    Diabetes mellitus without complication (HCC)    Type 2   Esophageal mass 02/2019   has upper endo with [place] with biopsy   Fatty liver    Fracture of lateral malleolus of right fibula 06/2015   GERD (gastroesophageal reflux disease)    history ofHepatomegaly    Hyperlipidemia    Hypertension    Neurocardiogenic syncope    faints 2 x week   Neuromuscular disorder (HCC)    Dystonia   OCD (obsessive compulsive disorder)    OCD (obsessive compulsive disorder)    Osteoporosis    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   Pancreatic pseudocyst    Panic attack    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, working on OB floor as a nurse   Syncope    neurocardiogenic   Third degree heart block (HCC)     Family History  Problem Relation Age of Onset   Stroke Mother    Kidney disease Mother    Colon polyps Mother    Hypertension Mother    Kidney failure Mother    Lung cancer Father        stage IV    Colon polyps Father    Hypertension Father    Stomach cancer Maternal Grandfather    Colon cancer Paternal Grandfather    Hypertension Sister    Hyperlipidemia Sister    Hypertension Brother    Hyperlipidemia Brother    Rectal cancer Neg Hx    Esophageal cancer Neg Hx    Pancreatic cancer Neg Hx     Past Surgical History:   Procedure Laterality Date   ABDOMINAL HYSTERECTOMY  2009   ANTERIOR AND POSTERIOR REPAIR  2009   BONE EXCISION Right 07/08/2022   Procedure: PARTIAL FIBULA BONE EXCISION WITH I&D;  Surgeon: Sheral Apley, MD;  Location: [facility];  Service: Orthopedics;  Laterality: Right;   BUNIONECTOMY Bilateral    CESAREAN SECTION     EP IMPLANTABLE  DEVICE N/A 10/12/2016   Procedure: Biotronik Pacemaker Implant;  Surgeon: Will Jorja Loa, MD;  Location: [facility];  Service: Cardiovascular;  Laterality: N/A;   finfger surgery Left    left middle fingger   FINGER SURGERY     HARDWARE REMOVAL Right 05/23/2022   Procedure: HARDWARE REMOVAL;  Surgeon: Teryl Lucy, MD;  Location: [facility] OR;  Service: Orthopedics;  Laterality: Right;   I & D EXTREMITY Right 07/30/2022   Procedure: PARTIAL EXCISION RIGHT FIBULA;  Surgeon: Nadara Mustard, MD;  Location: [facility] OR;  Service: Orthopedics;  Laterality: Right;   INCISION AND DRAINAGE ABSCESS Right 05/23/2022   Procedure: INCISION AND DRAINAGE ABSCESS, RIGHT ANKLE;  Surgeon: Teryl Lucy, MD;  Location: [facility] OR;  Service: Orthopedics;  Laterality: Right;   LTCS     MYRINGOTOMY  1966   ORIF ANKLE FRACTURE Right 07/25/2015   Procedure: OPEN REDUCTION INTERNAL FIXATION (ORIF) RIGHT ANKLE ;  Surgeon: Sheral Apley, MD;  Location: [facility];  Service: Orthopedics;  Laterality: Right;   RHINOPLASTY     age 33   sub mandibular cyst removal from right side of tongue  2019   TONSILLECTOMY  1966   and adenoids removed   TUBAL LIGATION  2005   Social History   Occupational History   Occupation: [employer] fundraiser  Tobacco Use   Smoking status: Every Day    Packs/day: 1.00    Years: 15.00    Total pack years: 15.00    Types: Cigarettes   Smokeless tobacco: Never   Tobacco comments:    1/2 -1 ppd  Vaping Use   Vaping Use: Former   Substances: Nicotine  Substance and Sexual Activity   Alcohol use: Yes    Comment: social   Drug  use: No   Sexual activity: Not Currently    Birth control/protection: Surgical, Abstinence    Comment: Hysterectomy

## 2022-08-26 ENCOUNTER — Encounter: Payer: Medicare Other | Admitting: Internal Medicine

## 2022-08-31 ENCOUNTER — Ambulatory Visit (INDEPENDENT_AMBULATORY_CARE_PROVIDER_SITE_OTHER): Payer: Medicare Other | Admitting: Orthopedic Surgery

## 2022-08-31 DIAGNOSIS — M86271 Subacute osteomyelitis, right ankle and foot: Secondary | ICD-10-CM

## 2022-09-07 ENCOUNTER — Encounter: Payer: Self-pay | Admitting: Orthopedic Surgery

## 2022-09-07 NOTE — Progress Notes (Signed)
Office Visit Note   Patient: Brittney Cooper           Date of Birth: 12/22/1961           MRN: [id number] Visit Date: 08/31/2022              Requested by: Loraine Leriche., MD [address] [place],  [place] [address] PCP: Loraine Leriche., MD  Chief Complaint  Patient presents with   Right Leg - Routine Post Op    07/30/2022 partial excision right fib       HPI: Patient is a 61 year old woman status post partial excision right distal fibula for osteomyelitis.  Patient states she has some clear drainage is wearing a compression sock is full weightbearing.  Patient had application of Kerecis micro graft.  Assessment & Plan: Visit Diagnoses:  1. Subacute osteomyelitis of right ankle (HCC)     Plan: Continue Dial soap cleansing compression weightbearing as tolerated.  Three-view radiographs of the right ankle at follow-up  Follow-Up Instructions: Return in about 2 weeks (around 09/14/2022).   Ortho Exam  Patient is alert, oriented, no adenopathy, well-dressed, normal affect, normal respiratory effort. Examination patient has a strong dorsalis pedis pulse cultures were negative.  There is slight dehiscence of the wound but there is no cellulitis.  Most recent hemoglobin A1c is 8.3.  Imaging: No results found. No images are attached to the encounter.  Labs: Lab Results  Component Value Date   HGBA1C 8.3 (H) 05/24/2022   HGBA1C 9.0 (H) 11/30/2020   HGBA1C 6.6 (H) 02/06/2017   ESRSEDRATE 6 07/02/2022   ESRSEDRATE 60 (H) 05/24/2022   CRP 2.5 07/02/2022   CRP 12.3 (H) 05/24/2022   REPTSTATUS 08/04/2022 FINAL 07/30/2022   GRAMSTAIN NO WBC SEEN NO ORGANISMS SEEN  07/30/2022   CULT  07/30/2022    No growth aerobically or anaerobically. Performed at [facility], [place] [address], [place], [place] [address]    LABORGA STAPHYLOCOCCUS AUREUS 05/23/2022     Lab Results  Component Value Date   ALBUMIN 3.0 (L) 05/24/2022   ALBUMIN 3.7 05/23/2022   ALBUMIN 3.3  (L) 12/01/2020    Lab Results  Component Value Date   MG 1.9 12/01/2020   MG 2.1 02/03/2017   No results found for: "VD25OH"  No results found for: "PREALBUMIN"    Latest Ref Rng & Units 07/08/2022    9:30 AM 07/02/2022    2:06 AM 05/25/2022    3:33 AM  CBC EXTENDED  WBC 3.8 - 10.8 Thousand/uL  6.7  5.7   RBC 3.80 - 5.10 Million/uL  4.23  3.02   Hemoglobin 12.0 - 15.0 g/dL 13.6  14.0  9.8   HCT 36.0 - 46.0 % 40.0  40.7  30.5   Platelets 140 - 400 Thousand/uL  284  212      There is no height or weight on file to calculate BMI.  Orders:  No orders of the defined types were placed in this encounter.  No orders of the defined types were placed in this encounter.    Procedures: No procedures performed  Clinical Data: No additional findings.  ROS:  All other systems negative, except as noted in the HPI. Review of Systems  Objective: Vital Signs: There were no vitals taken for this visit.  Specialty Comments:  No specialty comments available.  PMFS History: Patient Active Problem List   Diagnosis Date Noted   Osteomyelitis of right fibula ([place]) 07/30/2022  Subacute osteomyelitis of right fibula (HCC)    Peripherally inserted central catheter (PICC) in place 06/08/2022   MSSA (methicillin susceptible Staphylococcus aureus) infection 06/08/2022   Acute hematogenous osteomyelitis of right fibula (HCC) 05/25/2022   Infected hardware in right lower extremity (HCC) 05/23/2022   Tobacco abuse 11/30/2020   Hypertension 11/10/2020   Atrial tachycardia (HCC) 03/12/2020   Dysautonomia (HCC) 01/13/2020   Pacemaker 01/13/2020   Type 2 diabetes mellitus with hyperglycemia, without long-term current use of insulin (HCC) 02/09/2017   MDD (major depressive disorder), recurrent severe, 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   Complete heart block (HCC)    Palpitation 10/11/2016   Acute pancreatitis 09/15/2016   Alcohol abuse 09/15/2016   Tobacco use 09/15/2016   Anxiety  09/15/2016   Hypokalemia 09/15/2016   Pancreatitis 07/09/2016   Alcoholic pancreatitis 07/08/2016   Alcohol withdrawal (HCC) 07/08/2016   Bipolar disorder (HCC) 07/08/2016   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   Left shoulder pain 09/09/2014   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   Allergy    Anxiety    Arthritis    Bipolar disorder (HCC) 2    Cardiac arrest (HCC) 06-2015, 08-2016   x3- pt has pacemaker    Chronic pain    back   Complication of anesthesia    woke up during 2 surgical procedure hydterectomy and colonscopy   Depression    Diabetes mellitus without complication (HCC)    Type 2   Esophageal mass 02/2019   has upper endo with [place] with biopsy   Fatty liver    Fracture of lateral malleolus of right fibula 06/2015   GERD (gastroesophageal reflux disease)    history ofHepatomegaly    Hyperlipidemia    Hypertension    Neurocardiogenic syncope    faints 2 x week   Neuromuscular disorder (HCC)    Dystonia   OCD (obsessive compulsive disorder)    OCD (obsessive compulsive disorder)    Osteoporosis    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   Pancreatic pseudocyst    Panic attack    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, working on OB floor as a nurse   Syncope    neurocardiogenic   Third degree heart block (HCC)     Family History  Problem Relation Age of Onset   Stroke Mother    Kidney disease Mother    Colon polyps Mother    Hypertension Mother    Kidney failure Mother    Lung cancer Father        stage IV    Colon polyps Father    Hypertension Father    Stomach cancer Maternal Grandfather    Colon cancer Paternal Grandfather    Hypertension Sister    Hyperlipidemia Sister    Hypertension Brother    Hyperlipidemia Brother    Rectal cancer Neg Hx    Esophageal cancer Neg Hx    Pancreatic cancer Neg Hx      Past Surgical History:  Procedure Laterality Date   ABDOMINAL HYSTERECTOMY  2009   ANTERIOR AND POSTERIOR REPAIR  2009   BONE EXCISION Right 07/08/2022   Procedure: PARTIAL FIBULA BONE EXCISION WITH I&D;  Surgeon: Sheral Apley, MD;  Location: [facility];  Service: Orthopedics;  Laterality: Right;   BUNIONECTOMY Bilateral    CESAREAN SECTION     EP IMPLANTABLE  DEVICE N/A 10/12/2016   Procedure: Biotronik Pacemaker Implant;  Surgeon: Will Jorja Loa, MD;  Location: [facility];  Service: Cardiovascular;  Laterality: N/A;   finfger surgery Left    left middle fingger   FINGER SURGERY     HARDWARE REMOVAL Right 05/23/2022   Procedure: HARDWARE REMOVAL;  Surgeon: Teryl Lucy, MD;  Location: [facility] OR;  Service: Orthopedics;  Laterality: Right;   I & D EXTREMITY Right 07/30/2022   Procedure: PARTIAL EXCISION RIGHT FIBULA;  Surgeon: Nadara Mustard, MD;  Location[facility];  Service: Orthopedics;  Laterality: Right;   INCISION AND DRAINAGE ABSCESS Right 05/23/2022   Procedure: INCISION AND DRAINAGE ABSCESS, RIGHT ANKLE;  Surgeon: Teryl Lucy, MD;  Location: [facility] OR;  Service: Orthopedics;  Laterality: Right;   LTCS     MYRINGOTOMY  1966   ORIF ANKLE FRACTURE Right 07/25/2015   Procedure: OPEN REDUCTION INTERNAL FIXATION (ORIF) RIGHT ANKLE ;  Surgeon: Sheral Apley, MD;  Location: [facility];  Service: Orthopedics;  Laterality: Right;   RHINOPLASTY     age 29   sub mandibular cyst removal from right side of tongue  2019   TONSILLECTOMY  1966   and adenoids removed   TUBAL LIGATION  2005   Social History   Occupational History   Occupation: Charity fundraiser  Tobacco Use   Smoking status: Every Day    Packs/day: 1.00    Years: 15.00    Total pack years: 15.00    Types: Cigarettes   Smokeless tobacco: Never   Tobacco comments:    1/2 -1 ppd  Vaping Use   Vaping Use: Former   Substances: Nicotine  Substance and Sexual Activity   Alcohol use: Yes     Comment: social   Drug use: No   Sexual activity: Not Currently    Birth control/protection: Surgical, Abstinence    Comment: Hysterectomy

## 2022-09-08 ENCOUNTER — Other Ambulatory Visit (HOSPITAL_BASED_OUTPATIENT_CLINIC_OR_DEPARTMENT_OTHER): Payer: Self-pay | Admitting: Cardiovascular Disease

## 2022-09-08 NOTE — Telephone Encounter (Signed)
Rx request sent to pharmacy.  

## 2022-09-10 NOTE — Progress Notes (Signed)
Advanced Hypertension Clinic Follow Up:    Date:  09/13/2022   ID:  Brittney Cooper, DOB [birth year], MRN [id number]  PCP:  Cheron Schaumann., MD  Cardiologist:  Chilton Si, MD   Referring MD: Cheron Schaumann.,*   CC: Hypertension  History of Present Illness:    Brittney Cooper is a 61 y.o. female with a hx of hypertension and dysautonomia and syncope s/p PPM, pulmonary hypertension, atrial tachycardia, EtOH related pancreatitis here for follow-up. She was initially seen 12/08/2021 in the [facility]pert[facility] Clinic. She has had a couple falls and syncope.  She wore an ambulatory monitor 11/2019 when she had a syncopal event and was noted to be in sinus rhythm.  In 2016 she had an episode of syncope and was found to have intermittent CHB.  This was associated with 6-second pauses.  She had a Biotronik pacemaker implanted.  She also saw Dr. [name] at [facility] and trazodone was discontinued with some improvement in her dizziness and no additional episodes of syncope.  For the prior 3 years she had very labile hypertension.  She was taking midodrine and hydralazine as needed.    Brittney Cooper was previously unable to exercise because of shortness of breath.  She tried holding Cymbalta to see if it helped her blood pressure but there was no improvement.  She was continuing to smoke. Hydralazine and midodrine were both held and she was started on bisoprolol and doxazosin.  Her blood pressures improved.  She had lab testing that was negative for both pheochromocytoma and hyperaldosteronism.  Thyroid function was normal.  Renal artery Dopplers were within normal limits 08/2020.  Her blood pressure remained labile but was better.  Losartan was added to her regimen.  She called the office reporting an episode of syncope on 03/2021.  Event monitor at the time revealed sinus arrhythmia with a heart rate in the 100s.  It occurred while walking with her service dog.  She last saw Dr. Graciela Husbands 04/2021 and  reported labile blood pressures ranging from the 90s to the 220s.  He recommended that she use her morning blood pressure to guide her antihypertensive regimen.  He recommended that she not take any medicine if her blood pressure is less than 110 in the morning and treat her BP more aggressively if her BP is >160.   At her last visit she continued to struggle with labile blood pressures and recurrent syncope. One week prior she had a syncopal episode in the bedroom; her blood pressure at the time was 80/56. She had been salt loading without improvement. We added back her midodrine with recommendations for titration of her antihypertensives. She was instructed to check her BP daily and do the following: If SBP <130: midodrine 2.5mg  bid. If SBP 130-140: Midodrine 2.5mg , bisoprolol. If SBP 140-159: Midodrine, bisoprolol and losartan. If SBP >160: bisoprolol and losartan. She was referred to the syncope clinic at [facility] for dysautonomia. She saw Edd Fabian, NP 04/2022 for pre-op clearance and no further testing was needed.  Today, she states she has been struggling with multiple life stressors. In the past few months she has underwent several orthopedic surgeries. Frequently she suffers from fractures, and she was recently diagnosed with dystonia. She also continues to lose consciousness twice a week. Previously she had 2-3 seconds prior to passing out; lately she only has 1 second of warning. When she passed out recently, her BP was normal. She presents a BP log today, personally reviewed. This shows  ranges of 120-160 systolic, mostly with higher readings. She continues to follow the recommendations for antihypertensive titration given to her at her last visit. For activity she recently planted a garden. This was also to grow produce for healthy smoothies. She states that her blood sugars have been high lately. Currently she is smoking 1 ppd. She no longer consumes alcohol. She denies any palpitations, chest pain,  shortness of breath, or peripheral edema. No lightheadedness, headaches, orthopnea, or PND.  Previous antihypertensives: Metoprolol- nightmares Hydralazine  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   Allergy    Anxiety    Arthritis    Bipolar disorder (HCC) 2    Cardiac arrest (HCC) 06-2015, 08-2016   x3- pt has pacemaker    Chronic pain    back   Complication of anesthesia    woke up during 2 surgical procedure hydterectomy and colonscopy   Depression    Diabetes mellitus without complication (HCC)    Type 2   Esophageal mass 02/2019   has upper endo with [place] with biopsy   Fatty liver    Fracture of lateral malleolus of right fibula 06/2015   GERD (gastroesophageal reflux disease)    history ofHepatomegaly    Hyperlipidemia    Hypertension    Neurocardiogenic syncope    faints 2 x week   Neurocardiogenic syncope 09/13/2022   Neuromuscular disorder (HCC)    Dystonia   OCD (obsessive compulsive disorder)    OCD (obsessive compulsive disorder)    Osteoporosis    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   Pancreatic pseudocyst    Panic attack    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, working on OB floor as a nurse   Syncope    neurocardiogenic   Third degree heart block (HCC)     Past Surgical History:  Procedure Laterality Date   ABDOMINAL HYSTERECTOMY  2009   ANTERIOR AND POSTERIOR REPAIR  2009   BONE EXCISION Right 07/08/2022   Procedure: PARTIAL FIBULA BONE EXCISION WITH I&D;  Surgeon: Sheral Apley, MD;  Location: [facility];  Service: Orthopedics;  Laterality: Right;   BUNIONECTOMY Bilateral    CESAREAN SECTION     EP IMPLANTABLE DEVICE N/A 10/12/2016   Procedure: Biotronik Pacemaker Implant;  Surgeon: Will Jorja Loa, MD;  Location: [facility] INVASIVE CV LAB;  Service: Cardiovascular;  Laterality: N/A;    finfger surgery Left    left middle fingger   FINGER SURGERY     HARDWARE REMOVAL Right 05/23/2022   Procedure: HARDWARE REMOVAL;  Surgeon: Teryl Lucy, MD;  Location: [facility] OR;  Service: Orthopedics;  Laterality: Right;   I & D EXTREMITY Right 07/30/2022   Procedure: PARTIAL EXCISION RIGHT FIBULA;  Surgeon: Nadara Mustard, MD;  Location: [facility] OR;  Service: Orthopedics;  Laterality: Right;   INCISION AND DRAINAGE ABSCESS Right 05/23/2022   Procedure: INCISION AND DRAINAGE ABSCESS, RIGHT ANKLE;  Surgeon: Teryl Lucy, MD;  Location: [facility] OR;  Service: Orthopedics;  Laterality: Right;   LTCS     MYRINGOTOMY  1966   ORIF ANKLE FRACTURE Right 07/25/2015   Procedure: OPEN REDUCTION INTERNAL FIXATION (ORIF) RIGHT ANKLE ;  Surgeon: Sheral Apley, MD;  Location:  SURGERY CENTER;  Service: Orthopedics;  Laterality: Right;   RHINOPLASTY     age 32   sub mandibular cyst removal from  right side of tongue  2019   TONSILLECTOMY  1966   and adenoids removed   TUBAL LIGATION  2005    Current Medications: Current Meds  Medication Sig   ALPRAZolam (XANAX) 0.5 MG tablet Take 0.25-0.5 mg by mouth 3 (three) times daily as needed for anxiety. Take a half 1.2 tablet (0.25 mg) TID prn for anxiety and if needed take 1 tablet (0.5 mg) for panic attack or sleep   amphetamine-dextroamphetamine (ADDERALL XR) 30 MG 24 hr capsule Take 30 mg by mouth daily.   aspirin EC 81 MG tablet Take 1 tablet (81 mg total) by mouth 2 (two) times daily. To prevent blood clots for 30 days after surgery. (Patient taking differently: Take 81 mg by mouth daily. To prevent blood clots for 30 days after surgery.)   bisoprolol (ZEBETA) 5 MG tablet TAKE 1 TABLET BY MOUTH DAILY (Patient taking differently: Take 5 mg by mouth daily as needed (for systolic bp greater than 131.).)   diphenhydrAMINE (BENADRYL) 25 MG tablet Take 25 mg by mouth at bedtime as needed for sleep.   doxazosin (CARDURA) 1 MG tablet TAKE 1 TABLET BY MOUTH AT  BEDTIME (Patient taking differently: Take 1 mg by mouth at bedtime.)   DULoxetine (CYMBALTA) 60 MG capsule Take 60 mg by mouth in the morning.   flecainide (TAMBOCOR) 50 MG tablet TAKE ONE (1) TABLET BY MOUTH TWO (2) TIMES DAILY   folic acid (FOLVITE) 1 MG tablet Take 1 mg by mouth in the morning.   gabapentin (NEURONTIN) 300 MG capsule Take 300 mg by mouth at bedtime.   glipiZIDE (GLUCOTROL XL) 5 MG 24 hr tablet Take 5 mg by mouth daily.   insulin aspart (FIASP FLEXTOUCH) 100 UNIT/ML FlexTouch Pen Follow scale as directed   losartan (COZAAR) 25 MG tablet TAKE ONE (1) TABLET BY MOUTH EACH DAY   metFORMIN (GLUCOPHAGE) 1000 MG tablet Take 1,000 mg by mouth 2 (two) times daily.   midodrine (PROAMATINE) 2.5 MG tablet Take 1 tablet (2.5 mg total) by mouth 2 (two) times daily as needed. (Patient taking differently: Take 2.5 mg by mouth daily as needed (BP <130-159).)   Multiple Vitamins-Minerals (MULTIVITAMIN ADULT) CHEW Chew 1 each by mouth daily.   ondansetron (ZOFRAN) 4 MG tablet Take 1 tablet (4 mg total) by mouth every 8 (eight) hours as needed for nausea or vomiting.   oxyCODONE-acetaminophen (PERCOCET/ROXICET) 5-325 MG tablet Take 1 tablet by mouth every 4 (four) hours as needed.   pantoprazole (PROTONIX) 40 MG tablet Take 40 mg by mouth daily.   QUEtiapine (SEROQUEL) 100 MG tablet Take 100 mg by mouth at bedtime.   rosuvastatin (CRESTOR) 20 MG tablet Take 20 mg by mouth daily.     Allergies:   Codeine, Demerol [meperidine], Jardiance [empagliflozin], Lopressor [metoprolol], and Norco [hydrocodone-acetaminophen]   Social History   Socioeconomic History   Marital status: Divorced    Spouse name: Not on file   Number of children: Not on file   Years of education: Not on file   Highest education level: Not on file  Occupational History   Occupation: RN  Tobacco Use   Smoking status: Every Day    Packs/day: 1.00    Years: 15.00    Total pack years: 15.00    Types: Cigarettes    Smokeless tobacco: Never   Tobacco comments:    1/2 -1 ppd  Vaping Use   Vaping Use: Former   Substances: Nicotine  Substance and Sexual Activity   Alcohol  use: Yes    Comment: social   Drug use: No   Sexual activity: Not Currently    Birth control/protection: Surgical, Abstinence    Comment: Hysterectomy  Other Topics Concern   Not on file  Social History Narrative   Not on file   Social Determinants of Health   Financial Resource Strain: Not on file  Food Insecurity: Not on file  Transportation Needs: Not on file  Physical Activity: Not on file  Stress: Not on file  Social Connections: Not on file    Family History: The patient's family history includes Colon cancer in her paternal grandfather; Colon polyps in her father and mother; Hyperlipidemia in her brother and sister; Hypertension in her brother, father, mother, and sister; Kidney disease in her mother; Kidney failure in her mother; Lung cancer in her father; Stomach cancer in her maternal grandfather; Stroke in her mother. There is no history of Rectal cancer, Esophageal cancer, or Pancreatic cancer.  ROS:   Please see the history of present illness.    (+) Syncope All other systems reviewed and are negative.  EKGs/Labs/Other Studies Reviewed:    ABI Doppler  05/24/2022: Summary:  Right: Resting right ankle-brachial index is within normal range. No  evidence of significant right lower extremity arterial disease. The right  toe-brachial index is normal.   Left: Resting left ankle-brachial index is within normal range. No  evidence of significant left lower extremity arterial disease. The left  toe-brachial index is normal.   Bilateral Renal Artery Doppler  09/04/2020: Summary:  Largest Aortic Diameter: 1.9 cm     Renal:     Right: Normal size right kidney. Normal right Resistive Index.         Normal cortical thickness of right kidney. No evidence of         right renal artery stenosis. RRV flow present.   Left:  Normal size of left kidney. Normal left Resistive Index.         Normal cortical thickness of the left kidney. No evidence of         left renal artery stenosis. LRV flow present. Mild tortuosity         of the renal artery.  Mesenteric:  Normal Celiac artery and Superior Mesenteric artery findings.     Patent IVC.   Monitor  01/2020: Indication: syncope   Duration: 30d   Findings Recurrently SYncope assoc with sinus rhythm   Records of HR 180's is an error  Symptoms of Dizziness, LH, FLutters all assoc with sinus rhythm, sometimes regular and sometimes tachy   There was much Vpacing erroneously reported as VT, often assoc with concomitant Apacing, progressive AR intervals and then follow by pacing at various rates, presumably determined by the CLS algorithm   No significant arrhythmia noted    Echo  01/23/2020:  1. Left ventricular ejection fraction, by visual estimation, is 60 to  65%. The left ventricle has normal function. There is no left ventricular  hypertrophy.   2. The left ventricle has no regional wall motion abnormalities.   3. Global right ventricle has normal systolic function.The right  ventricular size is normal. No increase in right ventricular wall  thickness.   4. Left atrial size was normal.   5. Right atrial size was normal.   6. The mitral valve is normal in structure. Trivial mitral valve  regurgitation. No evidence of mitral stenosis.   7. The tricuspid valve is normal in structure.   8. The tricuspid  valve is normal in structure. Tricuspid valve  regurgitation is not demonstrated.   9. The aortic valve is normal in structure. Aortic valve regurgitation is  not visualized. No evidence of aortic valve sclerosis or stenosis.  10. The pulmonic valve was normal in structure. Pulmonic valve  regurgitation is not visualized.  11. Mildly elevated pulmonary artery systolic pressure.  12. The inferior vena cava is normal in size with greater than 50%   respiratory variability, suggesting right atrial pressure of 3 mmHg.    EKG:  EKG is personally reviewed. 09/13/2022:  Atrial paced. Rate 84 bpm. 12/08/21: Sinus arrhytnmia rate 98 bpm.  Atrial pacing.  Nonspecific T wave abnoarmality  Recent Labs: 05/24/2022: ALT 21 07/02/2022: Platelets 284 07/08/2022: Hemoglobin 13.6 07/30/2022: BUN 21; Creatinine, Ser 0.88; Potassium 3.7; Sodium 138   Recent Lipid Panel    Component Value Date/Time   CHOL 212 (H) 11/30/2020 0809   TRIG 129 11/30/2020 0809   HDL 48 11/30/2020 0809   CHOLHDL 4.4 11/30/2020 0809   VLDL 26 11/30/2020 0809   LDLCALC 138 (H) 11/30/2020 0809    Physical Exam:    VS:  BP (!) 160/106 (BP Location: Right Arm, Patient Position: Sitting, Cuff Size: Large)   Pulse 84   Ht 5\' 4"  (1.626 m)   Wt 175 lb 12.8 oz (79.7 kg)   BMI 30.18 kg/m     Wt Readings from Last 3 Encounters:  09/13/22 175 lb 12.8 oz (79.7 kg)  07/30/22 170 lb (77.1 kg)  07/22/22 171 lb 6.4 oz (77.7 kg)    GENERAL:  Well appearing HEENT: Pupils equal round and reactive, fundi not visualized, oral mucosa unremarkable NECK:  No jugular venous distention, waveform within normal limits, carotid upstroke brisk and symmetric, no bruits, no thyromegaly LUNGS:  Clear to auscultation bilaterally HEART:  RRR.  PMI not displaced or sustained,S1 and S2 within normal limits, no S3, no S4, no clicks, no rubs, no murmurs ABD:  Flat, positive bowel sounds normal in frequency in pitch, no bruits, no rebound, no guarding, no midline pulsatile mass, no hepatomegaly, no splenomegaly EXT:  2 plus pulses throughout, no edema, no cyanosis no clubbing SKIN:  No rashes no nodules NEURO:  Cranial nerves II through XII grossly intact, motor grossly intact throughout PSYCH:  Cognitively intact, oriented to person place and time   ASSESSMENT:    1. Neurocardiogenic syncope   2. Atrial tachycardia (HCC)   3. Complete heart block (HCC)     PLAN:    Neurocardiogenic  syncope She continues to have episodes of neurocardiogneic syncope.  When her BP is in the 120s, she passed out easily  The only way she can avoid this is for her BP to run higher.  She has a complex regimen of midodine and antihypertensive.  recommendations for titration of her antihypertensives. She was instructed to check her BP daily and do the following: If SBP <130: midodrine 2.5mg  bid. If SBP 130-140: Midodrine 2.5mg , bisoprolol. If SBP 140-159: Midodrine, bisoprolol and losartan. If SBP >160: bisoprolol and losartan. She was referred to the syncope clinic at [facility] for dysautonomia.  She can't see them until July.  Recommend that she wear compression socks when her BP is <120 systolic. Will refer to neurology for evaluation of whether this can be seizures.    Atrial tachycardia (HCC) Well controlled on flecainide.    Complete heart block [facility]) She has a pacemaker and is currently atrially paced.  Disposition:    FU with MD/PharmD  in 6 months.  Medication Adjustments/Labs and Tests Ordered: Current medicines are reviewed at length with the patient today.  Concerns regarding medicines are outlined above.   Orders Placed This Encounter  Procedures   Ambulatory referral to Neurology   EKG 12-Lead   No orders of the defined types were placed in this encounter.  I,Mathew Stumpf,acting as a Neurosurgeonscribe for Chilton Siiffany Manson, MD.,have documented all relevant documentation on the behalf of Chilton Siiffany [place], MD,as directed by  Chilton Siiffany Bainbridge, MD while in the presence of Chilton Siiffany Schubert, MD.  I, Mckenzie Bove C. Duke Salviaandolph, MD have reviewed all documentation for this visit.  The documentation of the exam, diagnosis, procedures, and orders on 09/13/2022 are all accurate and complete.   Signed, Chilton Siiffany Steuben, MD  09/13/2022 5:39 PM    [facility]

## 2022-09-13 ENCOUNTER — Encounter (HOSPITAL_BASED_OUTPATIENT_CLINIC_OR_DEPARTMENT_OTHER): Payer: Self-pay | Admitting: Cardiovascular Disease

## 2022-09-13 ENCOUNTER — Ambulatory Visit (INDEPENDENT_AMBULATORY_CARE_PROVIDER_SITE_OTHER): Payer: Medicare Other | Admitting: Cardiovascular Disease

## 2022-09-13 DIAGNOSIS — R55 Syncope and collapse: Secondary | ICD-10-CM | POA: Diagnosis not present

## 2022-09-13 DIAGNOSIS — I442 Atrioventricular block, complete: Secondary | ICD-10-CM | POA: Diagnosis not present

## 2022-09-13 DIAGNOSIS — I4719 Other supraventricular tachycardia: Secondary | ICD-10-CM

## 2022-09-13 DIAGNOSIS — I471 Supraventricular tachycardia: Secondary | ICD-10-CM | POA: Diagnosis not present

## 2022-09-13 HISTORY — DX: Syncope and collapse: R55

## 2022-09-13 NOTE — Patient Instructions (Addendum)
Medication Instructions:  Your physician recommends that you continue on your current medications as directed. Please refer to the Current Medication list given to you today.   Labwork: NONE  Testing/Procedures: NONE  Follow-Up: 03/16/2023 WITH DR [facility] AT 2:30 PM   Ambulatory referral to Neurology Where: [facility] Address: [address], [place] [address] Phone: [phone]  Any Other Special Instructions Will Be Listed Below (If Applicable).  WEAR COMPRESSION SOCKS IF BLOOD PRESSURE BELOW 120   If you need a refill on your cardiac medications before your next appointment, please call your pharmacy.

## 2022-09-13 NOTE — Assessment & Plan Note (Signed)
She has a pacemaker and is currently atrially paced.

## 2022-09-13 NOTE — Assessment & Plan Note (Signed)
Well controlled on flecainide.

## 2022-09-13 NOTE — Assessment & Plan Note (Signed)
She continues to have episodes of neurocardiogneic syncope.  When her BP is in the 120s, she passed out easily  The only way she can avoid this is for her BP to run higher.  She has a complex regimen of midodine and antihypertensive.  recommendations for titration of her antihypertensives. She was instructed to check her BP daily and do the following: If SBP <130: midodrine 2.5mg  bid. If SBP 130-140: Midodrine 2.5mg , bisoprolol. If SBP 140-159: Midodrine, bisoprolol and losartan. If SBP >160: bisoprolol and losartan. She was referred to the syncope clinic at [facility] for dysautonomia.  She can't see them until July.  Recommend that she wear compression socks when her BP is <327 systolic. Will refer to neurology for evaluation of whether this can be seizures.

## 2022-09-14 ENCOUNTER — Ambulatory Visit (INDEPENDENT_AMBULATORY_CARE_PROVIDER_SITE_OTHER): Payer: Medicare Other

## 2022-09-14 ENCOUNTER — Encounter: Payer: Self-pay | Admitting: Orthopedic Surgery

## 2022-09-14 ENCOUNTER — Ambulatory Visit (INDEPENDENT_AMBULATORY_CARE_PROVIDER_SITE_OTHER): Payer: Medicare Other | Admitting: Orthopedic Surgery

## 2022-09-14 ENCOUNTER — Encounter: Payer: Self-pay | Admitting: Neurology

## 2022-09-14 DIAGNOSIS — M86271 Subacute osteomyelitis, right ankle and foot: Secondary | ICD-10-CM

## 2022-09-14 NOTE — Progress Notes (Signed)
Office Visit Note   Patient: Brittney Cooper           Date of Birth: 11/27/1961           MRN: [id number] Visit Date: 09/14/2022              Requested by: Cheron Schaumann., MD [address] [place],  [place] [address] PCP: Cheron Schaumann., MD  Chief Complaint  Patient presents with   Right Ankle - Routine Post Op    07/30/2022 partial excision right fib      HPI: Patient is a 61 year old woman who presents status post removal of infected fibula right ankle with partial distal fibula excision.  Patient has had slight wound dehiscence.  She is approximately 6 weeks out from surgery.  Patient states she is trying to stop smoking and has purchased a vape.  Patient states her glucose is under good control.  Assessment & Plan: Visit Diagnoses:  1. Subacute osteomyelitis of right ankle (HCC)     Plan: Sutures harvested we will start wound care and will apply for authorization for Kerecis micro powder.  Patient has no active infection and may proceed with her dental work.  Follow-Up Instructions: Return in about 2 weeks (around 09/28/2022).   Ortho Exam  Patient is alert, oriented, no adenopathy, well-dressed, normal affect, normal respiratory effort. Examination patient has a strong dorsalis pedis and posterior tibial pulse there is slight wound dehiscence.  The sutures were removed there is healthy granulation tissue at the base.  The wound measures 9 cm in length 1 cm in width and 1 cm in depth.  Patient would significantly benefit from [facility] micro powder.  Imaging: XR Ankle Complete Right  Result Date: 09/14/2022 Three-view radiographs of the right ankle shows interval healing of the previous retained hardware.  The mortise is congruent.  No destructive lytic changes.  No images are attached to the encounter.  Labs: Lab Results  Component Value Date   HGBA1C 8.3 (H) 05/24/2022   HGBA1C 9.0 (H) 11/30/2020   HGBA1C 6.6 (H) 02/06/2017   ESRSEDRATE 6 07/02/2022    ESRSEDRATE 60 (H) 05/24/2022   CRP 2.5 07/02/2022   CRP 12.3 (H) 05/24/2022   REPTSTATUS 08/04/2022 FINAL 07/30/2022   GRAMSTAIN NO WBC SEEN NO ORGANISMS SEEN  07/30/2022   CULT  07/30/2022    No growth aerobically or anaerobically. Performed at [facility], [address], [place], [place] [address]    [facility] STAPHYLOCOCCUS AUREUS 05/23/2022     Lab Results  Component Value Date   ALBUMIN 3.0 (L) 05/24/2022   ALBUMIN 3.7 05/23/2022   ALBUMIN 3.3 (L) 12/01/2020    Lab Results  Component Value Date   MG 1.9 12/01/2020   MG 2.1 02/03/2017   No results found for: "VD25OH"  No results found for: "PREALBUMIN"    Latest Ref Rng & Units 07/08/2022    9:30 AM 07/02/2022    2:06 AM 05/25/2022    3:33 AM  CBC EXTENDED  WBC 3.8 - 10.8 Thousand/uL  6.7  5.7   RBC 3.80 - 5.10 Million/uL  4.23  3.02   Hemoglobin 12.0 - 15.0 g/dL 38.1  01.7  9.8   HCT 51.0 - 46.0 % 40.0  40.7  30.5   Platelets 140 - 400 Thousand/uL  284  212      There is no height or weight on file to calculate BMI.  Orders:  Orders Placed This Encounter  Procedures  XR Ankle Complete Right   No orders of the defined types were placed in this encounter.    Procedures: No procedures performed  Clinical Data: No additional findings.  ROS:  All other systems negative, except as noted in the HPI. Review of Systems  Objective: Vital Signs: There were no vitals taken for this visit.  Specialty Comments:  No specialty comments available.  PMFS History: Patient Active Problem List   Diagnosis Date Noted   Neurocardiogenic syncope 09/13/2022   Osteomyelitis of right fibula (HCC) 07/30/2022   Subacute osteomyelitis of right fibula (HCC)    Peripherally inserted central catheter (PICC) in place 06/08/2022   MSSA (methicillin susceptible Staphylococcus aureus) infection 06/08/2022   Acute hematogenous osteomyelitis of right fibula (HCC) 05/25/2022   Infected hardware in right lower extremity  (HCC) 05/23/2022   Tobacco abuse 11/30/2020   Hypertension 11/10/2020   Atrial tachycardia (HCC) 03/12/2020   Dysautonomia (HCC) 01/13/2020   Pacemaker 01/13/2020   Type 2 diabetes mellitus with hyperglycemia, without long-term current use of insulin (HCC) 02/09/2017   MDD (major depressive disorder), recurrent severe, 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   Complete heart block (HCC)    Palpitation 10/11/2016   Acute pancreatitis 09/15/2016   Alcohol abuse 09/15/2016   Tobacco use 09/15/2016   Anxiety 09/15/2016   Hypokalemia 09/15/2016   Pancreatitis 07/09/2016   Alcoholic pancreatitis 07/08/2016   Alcohol withdrawal (HCC) 07/08/2016   Bipolar disorder (HCC) 07/08/2016   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   Left shoulder pain 09/09/2014   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   Allergy    Anxiety    Arthritis    Bipolar disorder (HCC) 2    Cardiac arrest (HCC) 06-2015, 08-2016   x3- pt has pacemaker    Chronic pain    back   Complication of anesthesia    woke up during 2 surgical procedure hydterectomy and colonscopy   Depression    Diabetes mellitus without complication (HCC)    Type 2   Esophageal mass 02/2019   has upper endo with [place] with biopsy   Fatty liver    Fracture of lateral malleolus of right fibula 06/2015   GERD (gastroesophageal reflux disease)    history ofHepatomegaly    Hyperlipidemia    Hypertension    Neurocardiogenic syncope    faints 2 x week   Neurocardiogenic syncope 09/13/2022   Neuromuscular disorder (HCC)    Dystonia   OCD (obsessive compulsive disorder)    OCD (obsessive compulsive disorder)    Osteoporosis    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   Pancreatic pseudocyst    Panic attack    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, working on OB floor as a nurse   Syncope    neurocardiogenic    Third degree heart block (HCC)     Family History  Problem Relation Age of Onset   Stroke Mother    Kidney disease Mother    Colon polyps Mother    Hypertension Mother    Kidney failure Mother    Lung cancer Father        stage IV    Colon polyps Father    Hypertension Father    Stomach cancer Maternal Grandfather    Colon cancer Paternal Grandfather    Hypertension Sister    Hyperlipidemia Sister    Hypertension Brother  Hyperlipidemia Brother    Rectal cancer Neg Hx    Esophageal cancer Neg Hx    Pancreatic cancer Neg Hx     Past Surgical History:  Procedure Laterality Date   ABDOMINAL HYSTERECTOMY  2009   ANTERIOR AND POSTERIOR REPAIR  2009   BONE EXCISION Right 07/08/2022   Procedure: PARTIAL FIBULA BONE EXCISION WITH I&D;  Surgeon: Renette Butters, MD;  Location: [place];  Service: Orthopedics;  Laterality: Right;   BUNIONECTOMY Bilateral    CESAREAN SECTION     EP IMPLANTABLE DEVICE N/A 10/12/2016   Procedure: Biotronik Pacemaker Implant;  Surgeon: Will Meredith Leeds, MD;  Location: [place] [facility];  Service: Cardiovascular;  Laterality: N/A;   finfger surgery Left    left middle fingger   FINGER SURGERY     HARDWARE REMOVAL Right 05/23/2022   Procedure: HARDWARE REMOVAL;  Surgeon: Marchia Bond, MD;  Location: [place];  Service: Orthopedics;  Laterality: Right;   I & D EXTREMITY Right 07/30/2022   Procedure: PARTIAL EXCISION RIGHT FIBULA;  Surgeon: Newt Minion, MD;  Location: [place];  Service: Orthopedics;  Laterality: Right;   INCISION AND DRAINAGE ABSCESS Right 05/23/2022   Procedure: INCISION AND DRAINAGE ABSCESS, RIGHT ANKLE;  Surgeon: Marchia Bond, MD;  Location: [place];  Service: Orthopedics;  Laterality: Right;   LTCS     MYRINGOTOMY  1966   ORIF ANKLE FRACTURE Right 07/25/2015   Procedure: OPEN REDUCTION INTERNAL FIXATION (ORIF) RIGHT ANKLE ;  Surgeon: Renette Butters, MD;  Location: [place];  Service:  Orthopedics;  Laterality: Right;   RHINOPLASTY     age 17   sub mandibular cyst removal from right side of tongue  2019   TONSILLECTOMY  1966   and adenoids removed   TUBAL LIGATION  2005   Social History   Occupational History   Occupation: Therapist, sports  Tobacco Use   Smoking status: Every Day    Packs/day: 1.00    Years: 15.00    Total pack years: 15.00    Types: Cigarettes   Smokeless tobacco: Never   Tobacco comments:    1/2 -1 ppd  Vaping Use   Vaping Use: Former   Substances: Nicotine  Substance and Sexual Activity   Alcohol use: Yes    Comment: social   Drug use: No   Sexual activity: Not Currently    Birth control/protection: Surgical, Abstinence    Comment: Hysterectomy

## 2022-09-27 ENCOUNTER — Encounter: Payer: Self-pay | Admitting: Orthopedic Surgery

## 2022-09-27 ENCOUNTER — Ambulatory Visit (INDEPENDENT_AMBULATORY_CARE_PROVIDER_SITE_OTHER): Payer: Medicare Other | Admitting: Orthopedic Surgery

## 2022-09-27 ENCOUNTER — Telehealth: Payer: Self-pay | Admitting: Orthopedic Surgery

## 2022-09-27 DIAGNOSIS — M86271 Subacute osteomyelitis, right ankle and foot: Secondary | ICD-10-CM

## 2022-09-27 NOTE — Telephone Encounter (Signed)
Brittney Cooper wants to know if she can be worked in today. She got her appt wrong [phone]

## 2022-09-27 NOTE — Progress Notes (Signed)
Office Visit Note   Patient: Brittney Cooper           Date of Birth: [birth year]           MRN: [id number] Visit Date: 09/27/2022              Requested by: Loraine Leriche., MD [address] [place],  [place] [address] PCP: Loraine Leriche., MD  Chief Complaint  Patient presents with   Right Ankle - Routine Post Op    07/30/2022 partial excision right fib      HPI: Patient is a 61 year old woman who is 2 months status post excision infected distal fibula on the right with application of Kerecis tissue graft.  Patient has had wound dehiscence and slow healing however insurance would not cover reapplication of Kerecis within the office.  Patient is wearing her compression stocking and Percocet for pain.  Assessment & Plan: Visit Diagnoses:  1. Subacute osteomyelitis of right ankle (HCC)     Plan: Continue with the compression stocking change daily recommended scar massage with Shea butter.  Anticipate slow healing however this should heal without further surgical intervention.  Follow-Up Instructions: Return in about 4 weeks (around 10/25/2022).   Ortho Exam  Patient is alert, oriented, no adenopathy, well-dressed, normal affect, normal respiratory effort. Examination the wound is smaller there is no cellulitis no odor or drainage there is healthy granulation tissue at the base of the wound.  Wound measures 35 x 2 mm and 2 mm deep.  There is adhesive scar tissue around the wound.  Recommended scar massage.  Imaging: No results found. No images are attached to the encounter.  Labs: Lab Results  Component Value Date   HGBA1C 8.3 (H) 05/24/2022   HGBA1C 9.0 (H) 11/30/2020   HGBA1C 6.6 (H) 02/06/2017   ESRSEDRATE 6 07/02/2022   ESRSEDRATE 60 (H) 05/24/2022   CRP 2.5 07/02/2022   CRP 12.3 (H) 05/24/2022   REPTSTATUS 08/04/2022 FINAL 07/30/2022   GRAMSTAIN NO WBC SEEN NO ORGANISMS SEEN  07/30/2022   CULT  07/30/2022    No growth aerobically or  anaerobically. Performed at [facility], [place] [address] [place], [place] [address]    [facility] STAPHYLOCOCCUS AUREUS 05/23/2022     Lab Results  Component Value Date   ALBUMIN 3.0 (L) 05/24/2022   ALBUMIN 3.7 05/23/2022   ALBUMIN 3.3 (L) 12/01/2020    Lab Results  Component Value Date   MG 1.9 12/01/2020   MG 2.1 02/03/2017   No results found for: "VD25OH"  No results found for: "PREALBUMIN"    Latest Ref Rng & Units 07/08/2022    9:30 AM 07/02/2022    2:06 AM 05/25/2022    3:33 AM  CBC EXTENDED  WBC 3.8 - 10.8 Thousand/uL  6.7  5.7   RBC 3.80 - 5.10 Million/uL  4.23  3.02   Hemoglobin 12.0 - 15.0 g/dL 13.6  14.0  9.8   HCT 36.0 - 46.0 % 40.0  40.7  30.5   Platelets 140 - 400 Thousand/uL  284  212      There is no height or weight on file to calculate BMI.  Orders:  No orders of the defined types were placed in this encounter.  No orders of the defined types were placed in this encounter.    Procedures: No procedures performed  Clinical Data: No additional findings.  ROS:  All other systems negative, except as noted in the HPI. Review of Systems  Objective: Vital Signs: There were no vitals taken for this visit.  Specialty Comments:  No specialty comments available.  PMFS History: Patient Active Problem List   Diagnosis Date Noted   Neurocardiogenic syncope 09/13/2022   Osteomyelitis of right fibula (HCC) 07/30/2022   Subacute osteomyelitis of right fibula (HCC)    Peripherally inserted central catheter (PICC) in place 06/08/2022   MSSA (methicillin susceptible Staphylococcus aureus) infection 06/08/2022   Acute hematogenous osteomyelitis of right fibula (HCC) 05/25/2022   Infected hardware in right lower extremity (HCC) 05/23/2022   Tobacco abuse 11/30/2020   Hypertension 11/10/2020   Atrial tachycardia 03/12/2020   Dysautonomia (HCC) 01/13/2020   Pacemaker 01/13/2020   Type 2 diabetes mellitus with hyperglycemia, without long-term  current use of insulin (HCC) 02/09/2017   MDD (major depressive disorder), recurrent severe, 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   Complete heart block (HCC)    Palpitation 10/11/2016   Acute pancreatitis 09/15/2016   Alcohol abuse 09/15/2016   Tobacco use 09/15/2016   Anxiety 09/15/2016   Hypokalemia 09/15/2016   Pancreatitis 07/09/2016   Alcoholic pancreatitis 07/08/2016   Alcohol withdrawal (HCC) 07/08/2016   Bipolar disorder (HCC) 07/08/2016   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   Left shoulder pain 09/09/2014   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   Allergy    Anxiety    Arthritis    Bipolar disorder (HCC) 2    Cardiac arrest (HCC) 06-2015, 08-2016   x3- pt has pacemaker    Chronic pain    back   Complication of anesthesia    woke up during 2 surgical procedure hydterectomy and colonscopy   Depression    Diabetes mellitus without complication (HCC)    Type 2   Esophageal mass 02/2019   has upper endo with [place] with biopsy   Fatty liver    Fracture of lateral malleolus of right fibula 06/2015   GERD (gastroesophageal reflux disease)    history ofHepatomegaly    Hyperlipidemia    Hypertension    Neurocardiogenic syncope    faints 2 x week   Neurocardiogenic syncope 09/13/2022   Neuromuscular disorder (HCC)    Dystonia   OCD (obsessive compulsive disorder)    OCD (obsessive compulsive disorder)    Osteoporosis    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   Pancreatic pseudocyst    Panic attack    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, working on OB floor as a nurse   Syncope    neurocardiogenic   Third degree heart block (HCC)     Family History  Problem Relation Age of Onset   Stroke Mother    Kidney disease Mother    Colon polyps Mother    Hypertension Mother    Kidney failure Mother    Lung cancer Father         stage IV    Colon polyps Father    Hypertension Father    Stomach cancer Maternal Grandfather    Colon cancer Paternal Grandfather    Hypertension Sister    Hyperlipidemia Sister    Hypertension Brother    Hyperlipidemia Brother    Rectal cancer Neg Hx    Esophageal cancer Neg Hx    Pancreatic cancer Neg Hx     Past Surgical History:  Procedure Laterality Date   ABDOMINAL HYSTERECTOMY  2009   ANTERIOR AND POSTERIOR REPAIR  2009   BONE EXCISION Right 07/08/2022   Procedure: PARTIAL FIBULA BONE EXCISION WITH I&D;  Surgeon: Sheral Apley, MD;  Location: [facility];  Service: Orthopedics;  Laterality: Right;   BUNIONECTOMY Bilateral    CESAREAN SECTION     EP IMPLANTABLE DEVICE N/A 10/12/2016   Procedure: Biotronik Pacemaker Implant;  Surgeon: Will Jorja Loa, MD;  Location: [facility];  Service: Cardiovascular;  Laterality: N/A;   finfger surgery Left    left middle fingger   FINGER SURGERY     HARDWARE REMOVAL Right 05/23/2022   Procedure: HARDWARE REMOVAL;  Surgeon: Teryl Lucy, MD;  Location: [facility];  Service: Orthopedics;  Laterality: Right;   I & D EXTREMITY Right 07/30/2022   Procedure: PARTIAL EXCISION RIGHT FIBULA;  Surgeon: Nadara Mustard, MD;  [facility];  Service: Orthopedics;  Laterality: Right;   INCISION AND DRAINAGE ABSCESS Right 05/23/2022   Procedure: INCISION AND DRAINAGE ABSCESS, RIGHT ANKLE;  Surgeon: Teryl Lucy, MD;  Location: [facility];  Service: Orthopedics;  Laterality: Right;   LTCS     MYRINGOTOMY  1966   ORIF ANKLE FRACTURE Right 07/25/2015   Procedure: OPEN REDUCTION INTERNAL FIXATION (ORIF) RIGHT ANKLE ;  Surgeon: Sheral Apley, MD;  Location: [facility];  Service: Orthopedics;  Laterality: Right;   RHINOPLASTY     age 67   sub mandibular cyst removal from right side of tongue  2019   TONSILLECTOMY  1966   and adenoids removed   TUBAL LIGATION  2005   Social History   Occupational History    Occupation: Charity fundraiser  Tobacco Use   Smoking status: Every Day    Packs/day: 1.00    Years: 15.00    Total pack years: 15.00    Types: Cigarettes   Smokeless tobacco: Never   Tobacco comments:    1/2 -1 ppd  Vaping Use   Vaping Use: Former   Substances: Nicotine  Substance and Sexual Activity   Alcohol use: Yes    Comment: social   Drug use: No   Sexual activity: Not Currently    Birth control/protection: Surgical, Abstinence    Comment: Hysterectomy

## 2022-09-27 NOTE — Telephone Encounter (Signed)
Pt was seen in the office today.  

## 2022-10-07 ENCOUNTER — Ambulatory Visit (INDEPENDENT_AMBULATORY_CARE_PROVIDER_SITE_OTHER): Payer: Medicare Other

## 2022-10-07 DIAGNOSIS — I442 Atrioventricular block, complete: Secondary | ICD-10-CM | POA: Diagnosis not present

## 2022-10-07 LAB — CUP PACEART REMOTE DEVICE CHECK
Date Time Interrogation Session: 20231012085616
Implantable Lead Implant Date: 20171017
Implantable Lead Implant Date: 20171017
Implantable Lead Location: 753859
Implantable Lead Location: 753860
Implantable Lead Model: 377
Implantable Lead Model: 377
Implantable Lead Serial Number: 49593512
Implantable Lead Serial Number: 49620910
Implantable Pulse Generator Implant Date: 20171017
Pulse Gen Model: 394969
Pulse Gen Serial Number: 68784192

## 2022-10-14 NOTE — Progress Notes (Signed)
Remote pacemaker transmission.   

## 2022-10-20 ENCOUNTER — Encounter: Payer: Self-pay | Admitting: Neurology

## 2022-10-20 ENCOUNTER — Ambulatory Visit: Payer: Medicare Other | Admitting: Neurology

## 2022-10-20 DIAGNOSIS — Z029 Encounter for administrative examinations, unspecified: Secondary | ICD-10-CM

## 2022-10-25 ENCOUNTER — Encounter: Payer: Medicare Other | Admitting: Orthopedic Surgery

## 2022-10-27 ENCOUNTER — Ambulatory Visit: Payer: Medicare Other | Attending: Internal Medicine | Admitting: Internal Medicine

## 2022-10-27 ENCOUNTER — Encounter: Payer: Self-pay | Admitting: Internal Medicine

## 2022-10-27 VITALS — BP 138/95 | HR 85

## 2022-10-27 DIAGNOSIS — I10 Essential (primary) hypertension: Secondary | ICD-10-CM | POA: Diagnosis not present

## 2022-10-27 DIAGNOSIS — Z950 Presence of cardiac pacemaker: Secondary | ICD-10-CM

## 2022-10-27 DIAGNOSIS — G901 Familial dysautonomia [Riley-Day]: Secondary | ICD-10-CM | POA: Diagnosis not present

## 2022-10-27 DIAGNOSIS — I4719 Other supraventricular tachycardia: Secondary | ICD-10-CM | POA: Diagnosis not present

## 2022-10-27 DIAGNOSIS — I442 Atrioventricular block, complete: Secondary | ICD-10-CM | POA: Diagnosis not present

## 2022-10-27 NOTE — Progress Notes (Signed)
Patient Care Team: Cheron Schaumann., MD as PCP - General (Internal Medicine) Chilton Si, MD as PCP - Cardiology (Cardiology)   HPI  Brittney Cooper is a 61 y.o. female Seen in followup for dysautonomic syncope.She had recurrent syncope with a monitor on.This was associated with a 6 second pause 2 . She had head trauma requiring laceration. She underwent pacing with a Biotronik CLS.  She has continued however to have recurrent syncope, these all associated with normal rhythm.    Other contributing complex medical comorbidities outlined in the old notes. 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  Ended up with antibiotics and subsequently a surgical excision of her distal fibula  Continues with labile blood pressure, has been managed in concert with Dr. Armanda Heritage using a blood pressure directed algorithm of therapies  Scant palpitations on flecainide    Date Cr K Hgb  8/23 0.88 3.7 13.6         DATE TEST EF   1/21 Echo   60-65 %                 Records and Results Reviewed   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   Allergy    Anxiety    Arthritis    Bipolar disorder (HCC) 2    Cardiac arrest (HCC) 06-2015, 08-2016   x3- pt has pacemaker    Chronic pain    back   Complication of anesthesia    woke up during 2 surgical procedure hydterectomy and colonscopy   Depression    Diabetes mellitus without complication (HCC)    Type 2   Esophageal mass 02/2019   has upper endo with [place] with biopsy   Fatty liver    Fracture of lateral malleolus of right fibula 06/2015   GERD (gastroesophageal reflux disease)    history ofHepatomegaly    Hyperlipidemia    Hypertension     Neurocardiogenic syncope    faints 2 x week   Neurocardiogenic syncope 09/13/2022   Neuromuscular disorder (HCC)    Dystonia   OCD (obsessive compulsive disorder)    OCD (obsessive compulsive disorder)    Osteoporosis    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   Pancreatic pseudocyst    Panic attack    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, working on OB floor as a nurse   Syncope    neurocardiogenic   Third degree heart block (HCC)     Past Surgical History:  Procedure Laterality Date   ABDOMINAL HYSTERECTOMY  2009   ANTERIOR AND POSTERIOR REPAIR  2009   BONE EXCISION Right 07/08/2022   Procedure: PARTIAL FIBULA BONE EXCISION WITH I&D;  Surgeon: Sheral Apley, MD;  Location: [facility] [place];  Service: Orthopedics;  Laterality: Right;   BUNIONECTOMY Bilateral    CESAREAN SECTION     EP IMPLANTABLE DEVICE N/A 10/12/2016   Procedure: Biotronik Pacemaker Implant;  Surgeon: Will Jorja Loa, MD;  Location: [facility];  Service: Cardiovascular;  Laterality: N/A;   finfger surgery Left    left middle fingger   FINGER SURGERY     HARDWARE REMOVAL Right 05/23/2022  Procedure: HARDWARE REMOVAL;  Surgeon: Marchia Bond, MD;  Location: [place];  Service: Orthopedics;  Laterality: Right;   I & D EXTREMITY Right 07/30/2022   Procedure: PARTIAL EXCISION RIGHT FIBULA;  Surgeon: Newt Minion, MD;  Location: [place];  Service: Orthopedics;  Laterality: Right;   INCISION AND DRAINAGE ABSCESS Right 05/23/2022   Procedure: INCISION AND DRAINAGE ABSCESS, RIGHT ANKLE;  Surgeon: Marchia Bond, MD;  Location: [place];  Service: Orthopedics;  Laterality: Right;   LTCS     MYRINGOTOMY  1966   ORIF ANKLE FRACTURE Right 07/25/2015   Procedure: OPEN REDUCTION INTERNAL FIXATION (ORIF) RIGHT ANKLE ;  Surgeon: Renette Butters, MD;  Location: [place];  Service: Orthopedics;  Laterality: Right;   RHINOPLASTY     age 76   sub mandibular  cyst removal from right side of tongue  2019   TONSILLECTOMY  1966   and adenoids removed   TUBAL LIGATION  2005    Current Outpatient Medications  Medication Sig Dispense Refill   ACCU-CHEK GUIDE test strip      ALPRAZolam (XANAX) 0.5 MG tablet Take 0.25-0.5 mg by mouth 3 (three) times daily as needed for anxiety. Take a half 1.2 tablet (0.25 mg) TID prn for anxiety and if needed take 1 tablet (0.5 mg) for panic attack or sleep     amphetamine-dextroamphetamine (ADDERALL XR) 30 MG 24 hr capsule Take 30 mg by mouth daily.     aspirin EC 81 MG tablet Take 1 tablet (81 mg total) by mouth 2 (two) times daily. To prevent blood clots for 30 days after surgery. 60 tablet 0   bisoprolol (ZEBETA) 5 MG tablet TAKE 1 TABLET BY MOUTH DAILY 90 tablet 3   Calcium Carbonate-Vitamin D (CALTRATE 600+D PO) Take by mouth. 1 tab 2 times per day     COENZYME Q10 PO Take 30 mg by mouth 3 (three) times daily. 1 per day     diphenhydrAMINE (BENADRYL) 25 MG tablet Take 25 mg by mouth at bedtime as needed for sleep.     doxazosin (CARDURA) 1 MG tablet TAKE 1 TABLET BY MOUTH AT BEDTIME 90 tablet 1   DULoxetine (CYMBALTA) 60 MG capsule Take 60 mg by mouth in the morning.     flecainide (TAMBOCOR) 50 MG tablet TAKE ONE (1) TABLET BY MOUTH TWO (2) TIMES DAILY 409 tablet 1   folic acid (FOLVITE) 1 MG tablet Take 1 mg by mouth in the morning.     gabapentin (NEURONTIN) 300 MG capsule Take 300 mg by mouth at bedtime.     glipiZIDE (GLUCOTROL XL) 5 MG 24 hr tablet Take 5 mg by mouth daily.     Insulin Aspart (NOVOLOG FLEXPEN [place])      losartan (COZAAR) 25 MG tablet TAKE ONE (1) TABLET BY MOUTH EACH DAY 90 tablet 1   Magnesium 400 MG TABS Take by mouth. 1 daily     metFORMIN (GLUCOPHAGE) 1000 MG tablet Take 1,000 mg by mouth 2 (two) times daily.     midodrine (PROAMATINE) 2.5 MG tablet Take 1 tablet (2.5 mg total) by mouth 2 (two) times daily as needed. 60 tablet 5   Multiple Vitamins-Minerals (MULTIVITAMIN ADULT) CHEW Chew  1 each by mouth daily.     Multiple Vitamins-Minerals (ZINC PO) Take by mouth. 1 tab daily     OVER THE COUNTER MEDICATION Brain health 1 tab daily     oxyCODONE-acetaminophen (PERCOCET) 10-325 MG tablet Take 1 tablet by mouth every  4 (four) hours as needed for pain.     oxyCODONE-acetaminophen (PERCOCET/ROXICET) 5-325 MG tablet Take 1 tablet by mouth every 4 (four) hours as needed. 30 tablet 0   pantoprazole (PROTONIX) 40 MG tablet Take 40 mg by mouth daily.     QUEtiapine (SEROQUEL) 100 MG tablet Take 100 mg by mouth at bedtime.     rosuvastatin (CRESTOR) 20 MG tablet Take 20 mg by mouth daily.     No current facility-administered medications for this visit.    Allergies  Allergen Reactions   Codeine Nausea And Vomiting   Demerol [Meperidine] Other (See Comments)    Hallucinations    Jardiance [Empagliflozin] Other (See Comments)    Yeast infections   Lopressor [Metoprolol] Other (See Comments)    Nightmares    Norco [Hydrocodone-Acetaminophen] Nausea And Vomiting    Pt reports Vicodin caused "vomiting for six hours," reports being able to tolerate oxycodone and tramadol. Can take tylenol      Review of Systems negative except from HPI and PMH  Physical Exam BP (!) 138/95 (BP Location: Right Arm, Patient Position: Standing)   Pulse 85  Well developed and well nourished in no acute distress HENT normal Neck supple with JVP-flat Clear Device pocket well healed; without hematoma or erythema.  There is no tethering  Regular rate and rhythm, no  gallop No  murmur Abd-soft with active BS No Clubbing cyanosis   edema Skin-warm and dry A & Oriented  Grossly normal sensory and motor function  ECG sinus at 66 Interval 15/09/37 Nonspecific T wave flattening  Device function is normal. Programming changes none  See Paceart for details on control of the second issue could put MI: Vitals  Assessment and  Plan  Syncope neurally mediated/Dysautonomia  Complete Heart Block  intermittent  Pulm hypertension   Pacemaker  -- Biotronik       Hypertension-labile  Orthostatic hypertension  Atrial tachycardia  No interval syncope.  A litany of challenges related to her leg and knee surgery.  Depression related to the department of her youngest child 2 school. ) Depressed unable to get going.  Suggested she follow-up with her psychiatrist.  Hypertension is managed on a day-to-day basis derived on an algorithm particular by Dr. Octaviano Glow discussion s

## 2022-10-27 NOTE — Patient Instructions (Signed)
Medication Instructions:  Your physician recommends that you continue on your current medications as directed. Please refer to the Current Medication list given to you today.  *If you need a refill on your cardiac medications before your next appointment, please call your pharmacy*   Lab Work: None ordered.  If you have labs (blood work) drawn today and your tests are completely normal, you will receive your results only by: MyChart Message (if you have MyChart) OR A paper copy in the mail If you have any lab test that is abnormal or we need to change your treatment, we will call you to review the results.   Testing/Procedures: None ordered.    Follow-Up: At  [facility], you and your health needs are our priority.  As part of our continuing mission to provide you with exceptional heart care, we have created designated Provider Care Teams.  These Care Teams include your primary Cardiologist (physician) and Advanced Practice Providers (APPs -  Physician Assistants and Nurse Practitioners) who all work together to provide you with the care you need, when you need it.  We recommend signing up for the patient portal called "MyChart".  Sign up information is provided on this After Visit Summary.  MyChart is used to connect with patients for Virtual Visits (Telemedicine).  Patients are able to view lab/test results, encounter notes, upcoming appointments, etc.  Non-urgent messages can be sent to your provider as well.   To learn more about what you can do with MyChart, go to [email or web address].    Your next appointment:   12 months with Dr Klein  Important Information About Sugar       

## 2022-11-01 ENCOUNTER — Ambulatory Visit (INDEPENDENT_AMBULATORY_CARE_PROVIDER_SITE_OTHER): Payer: Medicare Other | Admitting: Orthopedic Surgery

## 2022-11-01 DIAGNOSIS — M86271 Subacute osteomyelitis, right ankle and foot: Secondary | ICD-10-CM

## 2022-11-01 MED ORDER — MUPIROCIN 2 % EX OINT: 1.0000 | TOPICAL_OINTMENT | Freq: Two times a day (BID) | CUTANEOUS | 3 refills | Status: DC

## 2022-11-02 ENCOUNTER — Encounter: Payer: Self-pay | Admitting: Orthopedic Surgery

## 2022-11-02 NOTE — Progress Notes (Signed)
Office Visit Note   Patient: Brittney Cooper           Date of Birth: [birth year]           MRN: [id number] Visit Date: 11/01/2022              Requested by: Loraine Leriche., MD [address] [place],  [place] [address] PCP: Loraine Leriche., MD  Chief Complaint  Patient presents with   Right Ankle - Follow-up    07/30/2022 partial excision right fib      HPI: Patient is a 61 year old woman who is 3 months status post partial excision right fibula application of Kerecis tissue graft.  Assessment & Plan: Visit Diagnoses:  1. Subacute osteomyelitis of right ankle ([place])     Plan: Patient will begin scar massage compression she is given a prescription for Bactroban if she develops any further areas of skin breakdown.  Follow-Up Instructions: Return in about 4 weeks (around 11/29/2022).   Ortho Exam  Patient is alert, oriented, no adenopathy, well-dressed, normal affect, normal respiratory effort. Examination the incision is well-healed there is swelling there is no drainage no cellulitis.  Imaging: No results found.    Labs: Lab Results  Component Value Date   HGBA1C 8.3 (H) 05/24/2022   HGBA1C 9.0 (H) 11/30/2020   HGBA1C 6.6 (H) 02/06/2017   ESRSEDRATE 6 07/02/2022   ESRSEDRATE 60 (H) 05/24/2022   CRP 2.5 07/02/2022   CRP 12.3 (H) 05/24/2022   REPTSTATUS 08/04/2022 FINAL 07/30/2022   GRAMSTAIN NO WBC SEEN NO ORGANISMS SEEN  07/30/2022   CULT  07/30/2022    No growth aerobically or anaerobically. Performed at [facility], [place] [address] [place],  [address]    LABORGA STAPHYLOCOCCUS AUREUS 05/23/2022     Lab Results  Component Value Date   ALBUMIN 3.0 (L) 05/24/2022   ALBUMIN 3.7 05/23/2022   ALBUMIN 3.3 (L) 12/01/2020    Lab Results  Component Value Date   MG 1.9 12/01/2020   MG 2.1 02/03/2017   No results found for: "VD25OH"  No results found for: "PREALBUMIN"    Latest Ref Rng & Units 07/08/2022    9:30 AM 07/02/2022    2:06  AM 05/25/2022    3:33 AM  CBC EXTENDED  WBC 3.8 - 10.8 Thousand/uL  6.7  5.7   RBC 3.80 - 5.10 Million/uL  4.23  3.02   Hemoglobin 12.0 - 15.0 g/dL 13.6  14.0  9.8   HCT 36.0 - 46.0 % 40.0  40.7  30.5   Platelets 140 - 400 Thousand/uL  284  212      There is no height or weight on file to calculate BMI.  Orders:  No orders of the defined types were placed in this encounter.  Meds ordered this encounter  Medications   mupirocin ointment (BACTROBAN) 2 %    Sig: Apply 1 Application topically 2 (two) times daily. Apply to the affected area 2 times a day    Dispense:  22 g    Refill:  3     Procedures: No procedures performed  Clinical Data: No additional findings.  ROS:  All other systems negative, except as noted in the HPI. Review of Systems  Objective: Vital Signs: There were no vitals taken for this visit.  Specialty Comments:  No specialty comments available.  PMFS History: Patient Active Problem List   Diagnosis Date Noted   Neurocardiogenic syncope 09/13/2022   Osteomyelitis of right  fibula (HCC) 07/30/2022   Subacute osteomyelitis of right fibula (HCC)    Peripherally inserted central catheter (PICC) in place 06/08/2022   MSSA (methicillin susceptible Staphylococcus aureus) infection 06/08/2022   Acute hematogenous osteomyelitis of right fibula (HCC) 05/25/2022   Infected hardware in right lower extremity (HCC) 05/23/2022   Tobacco abuse 11/30/2020   Hypertension 11/10/2020   Atrial tachycardia 03/12/2020   Dysautonomia (HCC) 01/13/2020   Pacemaker 01/13/2020   Type 2 diabetes mellitus with hyperglycemia, without long-term current use of insulin (HCC) 02/09/2017   MDD (major depressive disorder), recurrent severe, 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   Complete heart block (HCC)    Palpitation 10/11/2016   Acute pancreatitis 09/15/2016   Alcohol abuse 09/15/2016   Tobacco use 09/15/2016   Anxiety 09/15/2016   Hypokalemia 09/15/2016   Pancreatitis  07/09/2016   Alcoholic pancreatitis 07/08/2016   Alcohol withdrawal (HCC) 07/08/2016   Bipolar disorder (HCC) 07/08/2016   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   Left shoulder pain 09/09/2014   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   Allergy    Anxiety    Arthritis    Bipolar disorder (HCC) 2    Cardiac arrest (HCC) 06-2015, 08-2016   x3- pt has pacemaker    Chronic pain    back   Complication of anesthesia    woke up during 2 surgical procedure hydterectomy and colonscopy   Depression    Diabetes mellitus without complication (HCC)    Type 2   Esophageal mass 02/2019   has upper endo with [place] with biopsy   Fatty liver    Fracture of lateral malleolus of right fibula 06/2015   GERD (gastroesophageal reflux disease)    history ofHepatomegaly    Hyperlipidemia    Hypertension    Neurocardiogenic syncope    faints 2 x week   Neurocardiogenic syncope 09/13/2022   Neuromuscular disorder (HCC)    Dystonia   OCD (obsessive compulsive disorder)    OCD (obsessive compulsive disorder)    Osteoporosis    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   Pancreatic pseudocyst    Panic attack    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, working on OB floor as a nurse   Syncope    neurocardiogenic   Third degree heart block (HCC)     Family History  Problem Relation Age of Onset   Stroke Mother    Kidney disease Mother    Colon polyps Mother    Hypertension Mother    Kidney failure Mother    Lung cancer Father        stage IV    Colon polyps Father    Hypertension Father    Stomach cancer Maternal Grandfather    Colon cancer Paternal Grandfather    Hypertension Sister    Hyperlipidemia Sister    Hypertension Brother    Hyperlipidemia Brother    Rectal cancer Neg Hx    Esophageal cancer Neg Hx    Pancreatic cancer Neg Hx     Past Surgical  History:  Procedure Laterality Date   ABDOMINAL HYSTERECTOMY  2009   ANTERIOR AND POSTERIOR REPAIR  2009   BONE EXCISION Right 07/08/2022   Procedure: PARTIAL FIBULA BONE EXCISION WITH I&D;  Surgeon: Sheral Apley, MD;  Location: [facility] [place];  Service: Orthopedics;  Laterality: Right;   BUNIONECTOMY Bilateral  CESAREAN SECTION     EP IMPLANTABLE DEVICE N/A 10/12/2016   Procedure: Biotronik Pacemaker Implant;  Surgeon: Will Meredith Leeds, MD;  Location: [place] [facility];  Service: Cardiovascular;  Laterality: N/A;   finfger surgery Left    left middle fingger   FINGER SURGERY     HARDWARE REMOVAL Right 05/23/2022   Procedure: HARDWARE REMOVAL;  Surgeon: Marchia Bond, MD;  Location: [place];  Service: Orthopedics;  Laterality: Right;   I & D EXTREMITY Right 07/30/2022   Procedure: PARTIAL EXCISION RIGHT FIBULA;  Surgeon: Newt Minion, MD;  Location: [place];  Service: Orthopedics;  Laterality: Right;   INCISION AND DRAINAGE ABSCESS Right 05/23/2022   Procedure: INCISION AND DRAINAGE ABSCESS, RIGHT ANKLE;  Surgeon: Marchia Bond, MD;  Location: [place];  Service: Orthopedics;  Laterality: Right;   LTCS     MYRINGOTOMY  1966   ORIF ANKLE FRACTURE Right 07/25/2015   Procedure: OPEN REDUCTION INTERNAL FIXATION (ORIF) RIGHT ANKLE ;  Surgeon: Renette Butters, MD;  Location: [place];  Service: Orthopedics;  Laterality: Right;   RHINOPLASTY     age 67   sub mandibular cyst removal from right side of tongue  2019   TONSILLECTOMY  1966   and adenoids removed   TUBAL LIGATION  2005   Social History   Occupational History   Occupation: Therapist, sports  Tobacco Use   Smoking status: Every Day    Packs/day: 1.00    Years: 15.00    Total pack years: 15.00    Types: Cigarettes   Smokeless tobacco: Never   Tobacco comments:    1/2 -1 ppd  Vaping Use   Vaping Use: Former   Substances: Nicotine  Substance and Sexual Activity   Alcohol use: Yes    Comment: social    Drug use: No   Sexual activity: Not Currently    Birth control/protection: Surgical, Abstinence    Comment: Hysterectomy

## 2022-11-02 NOTE — Addendum Note (Signed)
Addended by: Michelle Nasuti on: 11/02/2022 12:48 PM   Modules accepted: Orders

## 2022-11-29 ENCOUNTER — Ambulatory Visit: Payer: Medicare Other | Admitting: Orthopedic Surgery

## 2022-12-13 ENCOUNTER — Ambulatory Visit: Payer: Medicare Other | Admitting: Orthopedic Surgery

## 2022-12-16 ENCOUNTER — Ambulatory Visit (INDEPENDENT_AMBULATORY_CARE_PROVIDER_SITE_OTHER): Payer: Medicare Other | Admitting: Orthopedic Surgery

## 2022-12-16 DIAGNOSIS — M86271 Subacute osteomyelitis, right ankle and foot: Secondary | ICD-10-CM | POA: Diagnosis not present

## 2022-12-17 ENCOUNTER — Encounter: Payer: Self-pay | Admitting: Orthopedic Surgery

## 2022-12-17 NOTE — Progress Notes (Signed)
Office Visit Note   Patient: Brittney Cooper           Date of Birth: 08-Aug-1961           MRN: [id number] Visit Date: 12/16/2022              Requested by: Cheron Schaumann., MD [address] [place],  [place] [address] PCP: Cheron Schaumann., MD  Chief Complaint  Patient presents with   Right Ankle - Follow-up    07/30/2022 partial excision right fib      HPI: Patient is a 61 year old woman who presents status post partial excision of the fibula for osteomyelitis on August 4.  Patient has been using compression socks and scar massage as well as Bactroban.  Assessment & Plan: Visit Diagnoses:  1. Subacute osteomyelitis of right ankle (HCC)     Plan: Both feet were examined she has no plantar ulcers.  She has good pulses.  She will continue with scar massage for the ankle wound.  Follow-Up Instructions: Return if symptoms worsen or fail to improve.   Ortho Exam  Patient is alert, oriented, no adenopathy, well-dressed, normal affect, normal respiratory effort. Examination patient has a good dorsalis pedis pulse bilaterally.  Examination the plantar aspect of both feet there were no calluses no ulcers.  No ulcers in the webspaces.  No dorsal foot ulcers.  No Charcot changes.  The lateral ankle incision has good healthy mobile skin proximally and distally the mid aspect of the wound that is 2 cm long and 2 mm wide has scar tissue with adhesions down to bone.  There is no redness no drainage no cellulitis no signs of infection.  Patient was given instructions for scar massage.  Imaging: No results found. No images are attached to the encounter.  Labs: Lab Results  Component Value Date   HGBA1C 8.3 (H) 05/24/2022   HGBA1C 9.0 (H) 11/30/2020   HGBA1C 6.6 (H) 02/06/2017   ESRSEDRATE 6 07/02/2022   ESRSEDRATE 60 (H) 05/24/2022   CRP 2.5 07/02/2022   CRP 12.3 (H) 05/24/2022   REPTSTATUS 08/04/2022 FINAL 07/30/2022   GRAMSTAIN NO WBC SEEN NO ORGANISMS SEEN  07/30/2022    CULT  07/30/2022    No growth aerobically or anaerobically. Performed at [facility], [address], [place], [place] [address]    [facility] STAPHYLOCOCCUS AUREUS 05/23/2022     Lab Results  Component Value Date   ALBUMIN 3.0 (L) 05/24/2022   ALBUMIN 3.7 05/23/2022   ALBUMIN 3.3 (L) 12/01/2020    Lab Results  Component Value Date   MG 1.9 12/01/2020   MG 2.1 02/03/2017   No results found for: "VD25OH"  No results found for: "PREALBUMIN"    Latest Ref Rng & Units 07/08/2022    9:30 AM 07/02/2022    2:06 AM 05/25/2022    3:33 AM  CBC EXTENDED  WBC 3.8 - 10.8 Thousand/uL  6.7  5.7   RBC 3.80 - 5.10 Million/uL  4.23  3.02   Hemoglobin 12.0 - 15.0 g/dL 09.3  23.5  9.8   HCT 57.3 - 46.0 % 40.0  40.7  30.5   Platelets 140 - 400 Thousand/uL  284  212      There is no height or weight on file to calculate BMI.  Orders:  No orders of the defined types were placed in this encounter.  No orders of the defined types were placed in this encounter.    Procedures: No  procedures performed  Clinical Data: No additional findings.  ROS:  All other systems negative, except as noted in the HPI. Review of Systems  Objective: Vital Signs: There were no vitals taken for this visit.  Specialty Comments:  No specialty comments available.  PMFS History: Patient Active Problem List   Diagnosis Date Noted   Neurocardiogenic syncope 09/13/2022   Osteomyelitis of right fibula ([place]) 07/30/2022   Subacute osteomyelitis of right fibula (HCC)    Peripherally inserted central catheter (PICC) in place 06/08/2022   MSSA (methicillin susceptible Staphylococcus aureus) infection 06/08/2022   Acute hematogenous osteomyelitis of right fibula ([place]) 05/25/2022   Infected hardware in right lower extremity ([place]) 05/23/2022   Tobacco abuse 11/30/2020   Hypertension 11/10/2020   Atrial tachycardia 03/12/2020   Dysautonomia ([place]) 01/13/2020   Pacemaker 01/13/2020   Type 2 diabetes  mellitus with hyperglycemia, without long-term current use of insulin ([place]) 02/09/2017   MDD (major depressive disorder), recurrent severe, without psychosis ([place]) 02/04/2017   Complete heart block (HCC)    Palpitation 10/11/2016   Acute pancreatitis 09/15/2016   Alcohol abuse 09/15/2016   Tobacco use 09/15/2016   Anxiety 09/15/2016   Hypokalemia 09/15/2016   Pancreatitis 123XX123   Alcoholic pancreatitis [id number]   Alcohol withdrawal ([place]) 07/08/2016   Bipolar disorder ([place]) 07/08/2016   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   Left shoulder pain 09/09/2014   Past Medical History:  Diagnosis Date   ADHD (attention deficit hyperactivity disorder)    Alcohol abuse    Alcoholic pancreatitis 123XX123, 11/16, 7/17   Allergy    Anxiety    Arthritis    Bipolar disorder ([place]) 2    Cardiac arrest ([place]) 06-2015, 08-2016   x3- pt has pacemaker    Chronic pain    back   Complication of anesthesia    woke up during 2 surgical procedure hydterectomy and colonscopy   Depression    Diabetes mellitus without complication ([place])    Type 2   Esophageal mass 02/2019   has upper endo with [place] with biopsy   Fatty liver    Fracture of lateral malleolus of right fibula 06/2015   GERD (gastroesophageal reflux disease)    history ofHepatomegaly    Hyperlipidemia    Hypertension    Neurocardiogenic syncope    faints 2 x week   Neurocardiogenic syncope 09/13/2022   Neuromuscular disorder (HCC)    Dystonia   OCD (obsessive compulsive disorder)    OCD (obsessive compulsive disorder)    Osteoporosis    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   Pancreatic pseudocyst    Panic attack    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, working on OB floor as a nurse   Syncope    neurocardiogenic   Third degree heart block ([place])     Family History  Problem Relation Age of Onset   Stroke Mother    Kidney disease Mother    Colon polyps Mother    Hypertension Mother    Kidney  failure Mother    Lung cancer Father        stage IV    Colon polyps Father    Hypertension Father    Stomach cancer Maternal Grandfather    Colon cancer Paternal Grandfather    Hypertension Sister    Hyperlipidemia Sister    Hypertension Brother    Hyperlipidemia Brother    Rectal cancer Neg Hx    Esophageal cancer Neg Hx    Pancreatic cancer  Neg Hx     Past Surgical History:  Procedure Laterality Date   ABDOMINAL HYSTERECTOMY  2009   ANTERIOR AND POSTERIOR REPAIR  2009   BONE EXCISION Right 07/08/2022   Procedure: PARTIAL FIBULA BONE EXCISION WITH I&D;  Surgeon: Renette Butters, MD;  Location: [facility];  Service: Orthopedics;  Laterality: Right;   BUNIONECTOMY Bilateral    CESAREAN SECTION     EP IMPLANTABLE DEVICE N/A 10/12/2016   Procedure: Biotronik Pacemaker Implant;  Surgeon: Will Meredith Leeds, MD;  Location: [facility];  Service: Cardiovascular;  Laterality: N/A;   finfger surgery Left    left middle fingger   FINGER SURGERY     HARDWARE REMOVAL Right 05/23/2022   Procedure: HARDWARE REMOVAL;  Surgeon: Marchia Bond, MD;  Location: [facility];  Service: Orthopedics;  Laterality: Right;   I & D EXTREMITY Right 07/30/2022   Procedure: PARTIAL EXCISION RIGHT FIBULA;  Surgeon: Newt Minion, MD;  Location: [facility];  Service: Orthopedics;  Laterality: Right;   INCISION AND DRAINAGE ABSCESS Right 05/23/2022   Procedure: INCISION AND DRAINAGE ABSCESS, RIGHT ANKLE;  Surgeon: Marchia Bond, MD;  Location: [facility];  Service: Orthopedics;  Laterality: Right;   LTCS     MYRINGOTOMY  1966   ORIF ANKLE FRACTURE Right 07/25/2015   Procedure: OPEN REDUCTION INTERNAL FIXATION (ORIF) RIGHT ANKLE ;  Surgeon: Renette Butters, MD;  Location: [facility];  Service: Orthopedics;  Laterality: Right;   RHINOPLASTY     age 49   sub mandibular cyst removal from right side of tongue  2019   TONSILLECTOMY  1966   and adenoids removed   TUBAL LIGATION  2005    Social History   Occupational History   Occupation: Therapist, sports  Tobacco Use   Smoking status: Every Day    Packs/day: 1.00    Years: 15.00    Total pack years: 15.00    Types: Cigarettes   Smokeless tobacco: Never   Tobacco comments:    1/2 -1 ppd  Vaping Use   Vaping Use: Former   Substances: Nicotine  Substance and Sexual Activity   Alcohol use: Yes    Comment: social   Drug use: No   Sexual activity: Not Currently    Birth control/protection: Surgical, Abstinence    Comment: Hysterectomy

## 2023-01-06 ENCOUNTER — Ambulatory Visit (INDEPENDENT_AMBULATORY_CARE_PROVIDER_SITE_OTHER): Payer: Medicare Other

## 2023-01-06 DIAGNOSIS — I442 Atrioventricular block, complete: Secondary | ICD-10-CM | POA: Diagnosis not present

## 2023-01-06 LAB — CUP PACEART REMOTE DEVICE CHECK
Battery Remaining Percentage: 50 %
Brady Statistic RA Percent Paced: 43 %
Brady Statistic RV Percent Paced: 0 %
Date Time Interrogation Session: 20240111080643
Implantable Lead Connection Status: 753985
Implantable Lead Connection Status: 753985
Implantable Lead Implant Date: 20171017
Implantable Lead Implant Date: 20171017
Implantable Lead Location: 753859
Implantable Lead Location: 753860
Implantable Lead Model: 377
Implantable Lead Model: 377
Implantable Lead Serial Number: 49593512
Implantable Lead Serial Number: 49620910
Implantable Pulse Generator Implant Date: 20171017
Lead Channel Impedance Value: 429 Ohm
Lead Channel Impedance Value: 507 Ohm
Lead Channel Pacing Threshold Amplitude: 0.6 V
Lead Channel Pacing Threshold Amplitude: 1.6 V
Lead Channel Pacing Threshold Pulse Width: 0.4 ms
Lead Channel Pacing Threshold Pulse Width: 0.4 ms
Lead Channel Sensing Intrinsic Amplitude: 0.6 mV
Lead Channel Sensing Intrinsic Amplitude: 3.7 mV
Lead Channel Setting Pacing Amplitude: 2 V
Lead Channel Setting Pacing Amplitude: 2.8 V
Lead Channel Setting Pacing Pulse Width: 0.4 ms
Pulse Gen Model: 394969
Pulse Gen Serial Number: 68784192

## 2023-01-18 ENCOUNTER — Other Ambulatory Visit: Payer: Self-pay | Admitting: Internal Medicine

## 2023-01-18 ENCOUNTER — Other Ambulatory Visit (HOSPITAL_BASED_OUTPATIENT_CLINIC_OR_DEPARTMENT_OTHER): Payer: Self-pay | Admitting: Cardiovascular Disease

## 2023-01-18 NOTE — Telephone Encounter (Signed)
Rx(s) sent to pharmacy electronically.  

## 2023-01-26 NOTE — Progress Notes (Signed)
Remote pacemaker transmission.   

## 2023-02-10 ENCOUNTER — Other Ambulatory Visit (HOSPITAL_BASED_OUTPATIENT_CLINIC_OR_DEPARTMENT_OTHER): Payer: Self-pay | Admitting: Cardiovascular Disease

## 2023-02-10 NOTE — Telephone Encounter (Signed)
Rx request sent to pharmacy.  

## 2023-02-12 ENCOUNTER — Emergency Department (HOSPITAL_BASED_OUTPATIENT_CLINIC_OR_DEPARTMENT_OTHER)
Admission: EM | Admit: 2023-02-12 | Discharge: 2023-02-12 | Disposition: A | Discharge status: Home / Self Care | Payer: 59 | Attending: Emergency Medicine | Admitting: Emergency Medicine

## 2023-02-12 ENCOUNTER — Encounter (HOSPITAL_BASED_OUTPATIENT_CLINIC_OR_DEPARTMENT_OTHER): Payer: Self-pay | Admitting: Emergency Medicine

## 2023-02-12 ENCOUNTER — Other Ambulatory Visit: Payer: Self-pay

## 2023-02-12 ENCOUNTER — Emergency Department (HOSPITAL_BASED_OUTPATIENT_CLINIC_OR_DEPARTMENT_OTHER): Payer: 59

## 2023-02-12 DIAGNOSIS — Z7984 Long term (current) use of oral hypoglycemic drugs: Secondary | ICD-10-CM | POA: Diagnosis not present

## 2023-02-12 DIAGNOSIS — E119 Type 2 diabetes mellitus without complications: Secondary | ICD-10-CM | POA: Diagnosis not present

## 2023-02-12 DIAGNOSIS — Z7982 Long term (current) use of aspirin: Secondary | ICD-10-CM | POA: Diagnosis not present

## 2023-02-12 DIAGNOSIS — Z4801 Encounter for change or removal of surgical wound dressing: Secondary | ICD-10-CM | POA: Diagnosis not present

## 2023-02-12 DIAGNOSIS — Z4800 Encounter for change or removal of nonsurgical wound dressing: Secondary | ICD-10-CM

## 2023-02-12 DIAGNOSIS — I10 Essential (primary) hypertension: Secondary | ICD-10-CM | POA: Insufficient documentation

## 2023-02-12 DIAGNOSIS — M25522 Pain in left elbow: Secondary | ICD-10-CM | POA: Insufficient documentation

## 2023-02-12 DIAGNOSIS — Z79899 Other long term (current) drug therapy: Secondary | ICD-10-CM | POA: Diagnosis not present

## 2023-02-12 MED ORDER — LIDOCAINE HCL (PF) 1 % IJ SOLN
5.0000 mL | Freq: Once | INTRAMUSCULAR | Status: AC
  Administered 2023-02-12: 5 mL via INTRADERMAL
  Filled 2023-02-12: qty 5

## 2023-02-12 NOTE — ED Notes (Signed)
Pt left elbow wound was cleaned and packed on Thursday. Today she was unable to remove and replace packing material on her own and needs assistance doing so. RN unwrapped wound which was dressed with kerlix gauze. ~2 inches of packing strip is exposed, no active bleeding, minimal drainage noted. Pt is seated on stretched in NAD.

## 2023-02-12 NOTE — ED Provider Notes (Signed)
[facility] Provider Note   CSN: [id number] Arrival date & time: 02/12/23  1414     History  No chief complaint on file.   Brittney Cooper is a 62 y.o. female with a past medical history as below presenting today for evaluation of left elbow pain.  Patient reports she got an I&D yesterday for septic bursitis with packing done.  She was told to return today for packing changes however she could not get a hold of the orthopedic office.  She reports the swelling on her elbow has improved however she noticed some swelling on her left hand which she thought might be because of the tight wrapping of her elbow.  She denies any skin color change, fever.  She reports seeing some blood drainage from her elbow.  Patient states she has an appointment with [facility] office on 02/14/2023.  Patient was given Rocephin and is still taking her clindamycin.  HPI  Past Medical History:  Diagnosis Date   ADHD (attention deficit hyperactivity disorder)    Alcohol abuse    Alcoholic pancreatitis 123XX123, 11/16, 7/17   Allergy    Anxiety    Arthritis    Bipolar disorder ([place]) 2    Cardiac arrest ([place]) 06-2015, 08-2016   x3- pt has pacemaker    Chronic pain    back   Complication of anesthesia    woke up during 2 surgical procedure hydterectomy and colonscopy   Depression    Diabetes mellitus without complication ([place])    Type 2   Esophageal mass 02/2019   has upper endo with [place] with biopsy   Fatty liver    Fracture of lateral malleolus of right fibula 06/2015   GERD (gastroesophageal reflux disease)    history ofHepatomegaly    Hyperlipidemia    Hypertension    Neurocardiogenic syncope    faints 2 x week   Neurocardiogenic syncope 09/13/2022   Neuromuscular disorder ([place])    Dystonia   OCD (obsessive compulsive disorder)    OCD (obsessive compulsive disorder)    Osteoporosis    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   Pancreatic pseudocyst    Panic  attack    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, working on OB floor as a nurse   Syncope    neurocardiogenic   Third degree heart block ([place])    Past Surgical History:  Procedure Laterality Date   ABDOMINAL HYSTERECTOMY  2009   ANTERIOR AND POSTERIOR REPAIR  2009   BONE EXCISION Right 07/08/2022   Procedure: PARTIAL FIBULA BONE EXCISION WITH I&D;  Surgeon: Renette Butters, MD;  Location: [facility];  Service: Orthopedics;  Laterality: Right;   BUNIONECTOMY Bilateral    CESAREAN SECTION     EP IMPLANTABLE DEVICE N/A 10/12/2016   Procedure: Biotronik Pacemaker Implant;  Surgeon: Will Meredith Leeds, MD;  Location: [facility];  Service: Cardiovascular;  Laterality: N/A;   finfger surgery Left    left middle fingger   FINGER SURGERY     HARDWARE REMOVAL Right 05/23/2022   Procedure: HARDWARE REMOVAL;  Surgeon: Marchia Bond, MD;  Location: [facility];  Service: Orthopedics;  Laterality: Right;   I & D EXTREMITY Right 07/30/2022   Procedure: PARTIAL EXCISION RIGHT FIBULA;  Surgeon: Newt Minion, MD;  Location: [facility];  Service: Orthopedics;  Laterality: Right;   INCISION AND DRAINAGE ABSCESS Right 05/23/2022   Procedure: INCISION AND  DRAINAGE ABSCESS, RIGHT ANKLE;  Surgeon: Marchia Bond, MD;  Location: [facility];  Service: Orthopedics;  Laterality: Right;   LTCS     MYRINGOTOMY  1966   ORIF ANKLE FRACTURE Right 07/25/2015   Procedure: OPEN REDUCTION INTERNAL FIXATION (ORIF) RIGHT ANKLE ;  Surgeon: Renette Butters, MD;  Location: [facility];  Service: Orthopedics;  Laterality: Right;   RHINOPLASTY     age 60   sub mandibular cyst removal from right side of tongue  2019   TONSILLECTOMY  1966   and adenoids removed   TUBAL LIGATION  2005     Home Medications Prior to Admission medications   Medication Sig Start Date End Date Taking? Authorizing Provider  ACCU-CHEK GUIDE test strip  09/24/22   [provider]  ALPRAZolam Duanne Moron) 0.5 MG tablet Take 0.25-0.5 mg by mouth 3 (three) times daily as needed for anxiety. Take a half 1.2 tablet (0.25 mg) TID prn for anxiety and if needed take 1 tablet (0.5 mg) for panic attack or sleep    [provider]  amphetamine-dextroamphetamine (ADDERALL XR) 30 MG 24 hr capsule Take 30 mg by mouth daily.    [provider]  aspirin EC 81 MG tablet Take 1 tablet (81 mg total) by mouth 2 (two) times daily. To prevent blood clots for 30 days after surgery. 07/08/22   Britt Bottom, PA-C  bisoprolol (ZEBETA) 5 MG tablet TAKE 1 TABLET BY MOUTH DAILY 01/18/23   Deboraha Sprang, MD  Calcium Carbonate-Vitamin D (CALTRATE 600+D PO) Take by mouth. 1 tab 2 times per day    [provider]  COENZYME Q10 PO Take 30 mg by mouth 3 (three) times daily. 1 per day    [provider]  diphenhydrAMINE (BENADRYL) 25 MG tablet Take 25 mg by mouth at bedtime as needed for sleep.    [provider]  doxazosin (CARDURA) 1 MG tablet TAKE 1 TABLET BY MOUTH AT BEDTIME 01/19/23   Deboraha Sprang, MD  DULoxetine (CYMBALTA) 60 MG capsule Take 60 mg by mouth in the morning.    [provider]  flecainide (TAMBOCOR) 50 MG tablet TAKE ONE (1) TABLET BY MOUTH TWO (2) TIMES DAILY 02/10/23   Skeet Latch, MD  folic acid (FOLVITE) 1 MG tablet Take 1 mg by mouth in the morning.    [provider]  gabapentin (NEURONTIN) 300 MG capsule Take 300 mg by mouth at bedtime. 12/14/21   [provider]  glipiZIDE (GLUCOTROL XL) 5 MG 24 hr tablet Take 5 mg by mouth daily. 05/17/22   [provider]  Insulin Aspart (NOVOLOG FLEXPEN [place])  09/24/22   [provider]  losartan (COZAAR) 25 MG tablet TAKE ONE (1) TABLET BY MOUTH EACH DAY 09/08/22   Skeet Latch, MD  Magnesium 400 MG TABS Take by mouth. 1 daily    [provider]  metFORMIN (GLUCOPHAGE) 1000 MG tablet Take 1,000 mg by mouth 2 (two) times daily. 12/25/19    [provider]  midodrine (PROAMATINE) 2.5 MG tablet TAKE ONE (1) TABLET BY MOUTH TWO (2) TIMES DAILY AS NEEDED 01/18/23   Deboraha Sprang, MD  Multiple Vitamins-Minerals (MULTIVITAMIN ADULT) CHEW Chew 1 each by mouth daily.    [provider]  Multiple Vitamins-Minerals (ZINC PO) Take by mouth. 1 tab daily    [provider]  mupirocin ointment (BACTROBAN) 2 % Apply 1 Application topically 2 (two) times daily. Apply to the affected area  2 times a day 11/01/22   Newt Minion, MD  OVER THE COUNTER MEDICATION Brain health 1 tab daily    [provider]  oxyCODONE-acetaminophen (PERCOCET) 10-325 MG tablet Take 1 tablet by mouth every 4 (four) hours as needed for pain.    [provider]  oxyCODONE-acetaminophen (PERCOCET/ROXICET) 5-325 MG tablet Take 1 tablet by mouth every 4 (four) hours as needed. 08/23/22   Newt Minion, MD  pantoprazole (PROTONIX) 40 MG tablet Take 40 mg by mouth daily.    [provider]  QUEtiapine (SEROQUEL) 100 MG tablet Take 100 mg by mouth at bedtime.    [provider]  rosuvastatin (CRESTOR) 20 MG tablet Take 20 mg by mouth daily.    [provider]      Allergies    Codeine, Demerol [meperidine], Jardiance [empagliflozin], Lopressor [metoprolol], and Norco [hydrocodone-acetaminophen]    Review of Systems   Review of Systems  Physical Exam Updated Vital Signs BP (!) 174/118   Pulse (!) 102   Temp 98.7 F (37.1 C)   Resp 16   Ht 5' 4"$  (1.626 m)   Wt 75.8 kg   SpO2 94%   BMI 28.67 kg/m  Physical Exam Vitals and nursing note reviewed.  Constitutional:      Appearance: Normal appearance.  HENT:     Head: Normocephalic and atraumatic.     Mouth/Throat:     Mouth: Mucous membranes are moist.  Eyes:     General: No scleral icterus. Cardiovascular:     Rate and Rhythm: Normal rate and regular rhythm.     Pulses: Normal pulses.     Heart sounds: Normal heart sounds.  Pulmonary:      Effort: Pulmonary effort is normal.     Breath sounds: Normal breath sounds.  Abdominal:     General: Abdomen is flat.     Palpations: Abdomen is soft.     Tenderness: There is no abdominal tenderness.  Musculoskeletal:        General: No deformity.  Skin:    General: Skin is warm.     Findings: No rash.     Comments: Well-healing I&D wound on the left elbow.  Mild swelling.  Mild overlying skin erythema.  Neurological:     General: No focal deficit present.     Mental Status: She is alert.  Psychiatric:        Mood and Affect: Mood normal.     ED Results / Procedures / Treatments   Labs (all labs ordered are listed, but only abnormal results are displayed) Labs Reviewed - No data to display  EKG None  Radiology DG Elbow Complete Left  Result Date: 02/12/2023 CLINICAL DATA:  Fall last week, pain EXAM: LEFT ELBOW - COMPLETE 3+ VIEW COMPARISON:  None Available. FINDINGS: No displaced fracture or dislocation. Probable small elbow joint effusion. There is no evidence of arthropathy or other focal bone abnormality. Severe soft tissue edema of the olecranon. IMPRESSION: 1. No displaced fracture or dislocation of the left elbow. Probable small elbow joint effusion suspicious for radiographically occult radial head or neck fracture. Correlate for acute point tenderness and consider delayed radiographs or MR to assess for subtle fracture. 2. Severe soft tissue edema overlying the olecranon. Electronically Signed   By: Delanna Ahmadi M.D.   On: 02/12/2023 15:18    Procedures Procedures    Medications Ordered in ED Medications  lidocaine (PF) (XYLOCAINE) 1 % injection 5 mL (5 mLs Intradermal Given 02/12/23  Maikayla.[place])    ED Course/ Medical Decision Making/ A&P                             Medical Decision Making Amount and/or Complexity of Data Reviewed Radiology: ordered.   This patient presents to the ED for wound packing change, this involves an extensive number of treatment  options, and is a complaint that carries with a high risk of complications and morbidity.  The differential diagnosis includes cellulitis, joint fracture, dislocation, occult fracture.  This is not an exhaustive list.  Imaging studies: I ordered imaging studies. I personally reviewed, interpreted imaging and agree with the radiologist's interpretations. The results include: X-ray of the left elbow show no displaced fracture or dislocation, it did show soft tissue edema overlying the olecranon.  Problem list/ ED course/ Critical interventions/ Medical management: HPI: See above Vital signs within normal range and stable throughout visit. Laboratory/imaging studies significant for: See above. On physical examination, patient is afebrile and appears in no acute distress.  There is a well-healing I and D wound on the left elbow.  No active bleeding or pus drainage.  I have removed the old packing and replaced with 10 cm of 1/4 inch iodoform.  She tolerated the procedure well.  No bleeding during the procedure.  There was minimal amount of pus on the old packing.  Patient has an appointment with orthopedics on 02/14/2023.  She is still taking her clindamycin for infection. Advised patient to take Tylenol/ibuprofen/naproxen for pain, follow-up with orthopedics for further evaluation and management, return to the ER if new or worsening symptoms.   I have reviewed the patient home medicines and have made adjustments as needed.  Cardiac monitoring/EKG: The patient was maintained on a cardiac monitor.  I personally reviewed and interpreted the cardiac monitor which showed an underlying rhythm of: sinus rhythm.  Additional history obtained: External records from outside source obtained and reviewed including: Chart review including previous notes, labs, imaging.  Disposition Continued outpatient therapy. Follow-up with orthopedics recommended for reevaluation of symptoms. Treatment plan discussed with  patient.  Pt acknowledged understanding was agreeable to the plan. Worrisome signs and symptoms were discussed with patient, and patient acknowledged understanding to return to the ED if they noticed these signs and symptoms. Patient was stable upon discharge.   This chart was dictated using voice recognition software.  Despite best efforts to proofread,  errors can occur which can change the documentation meaning.          Final Clinical Impression(s) / ED Diagnoses Final diagnoses:  Encounter for removal of abscess packing    Rx / DC Orders ED Discharge Orders     None         Rex Kras, [place] 02/12/23 [place], [place], DO 02/12/23 1735

## 2023-02-12 NOTE — ED Triage Notes (Signed)
Pt reports she fainted last week and fell and had wound to L elbow. Went to urgent care on Thursday and was told elbow was infected. Was given IM rocephin at that time. Went to orthopedic on Friday who did incision and drainage with packing. Was told to remove packing today and repack wound. Tried to call orthopedic office to repack, but could not get a hold of them so came. Says symptoms have not gotten worse. Hx of osteomyelitis.

## 2023-02-12 NOTE — Discharge Instructions (Signed)
Please take your medications as prescribed. Take tylenol/ibuprofen for pain. I recommend close follow-up with orthopedics for reevaluation.  Please do not hesitate to return to emergency department if worrisome signs symptoms we discussed become apparent.

## 2023-02-12 NOTE — ED Notes (Signed)
Placed ace wrap on pt's elbow. Ambulatory to exit.

## 2023-03-16 ENCOUNTER — Ambulatory Visit (HOSPITAL_BASED_OUTPATIENT_CLINIC_OR_DEPARTMENT_OTHER): Payer: 59 | Admitting: Cardiovascular Disease

## 2023-03-16 NOTE — Progress Notes (Incomplete)
Advanced Hypertension Clinic Follow Up:    Date:  03/16/2023   ID:  [facility], DOB 29-Dec-1960, MRN [id number]  PCP:  [facility]., MD  Cardiologist:  Brittney Latch, MD   Referring MD: [facility].,*   CC: Hypertension  History of Present Illness:    Brittney Cooper is a 62 y.o. female with a hx of hypertension and dysautonomia and syncope s/p PPM, pulmonary hypertension, atrial tachycardia, EtOH related pancreatitis here for follow-up. She was initially seen 12/08/2021 in the [facility]. She has had a couple falls and syncope.  She wore an ambulatory monitor 11/2019 when she had a syncopal event and was noted to be in sinus rhythm.  In 2016 she had an episode of syncope and was found to have intermittent CHB.  This was associated with 6-second pauses.  She had a Biotronik pacemaker implanted.  She also saw Dr. Tomie [facility] at [facility] and trazodone was discontinued with some improvement in her dizziness and no additional episodes of syncope.  For the prior 3 years she had very labile hypertension.  She was taking midodrine and hydralazine as needed.    [facility] was previously unable to exercise because of shortness of breath.  She tried holding Cymbalta to see if it helped her blood pressure but there was no improvement.  She was continuing to smoke. Hydralazine and midodrine were both held and she was started on bisoprolol and doxazosin.  Her blood pressures improved.  She had lab testing that was negative for both pheochromocytoma and hyperaldosteronism.  Thyroid function was normal.  Renal artery Dopplers were within normal limits 08/2020.  Her blood pressure remained labile but was better.  Losartan was added to her regimen.  She called the office reporting an episode of syncope on 03/2021.  Event monitor at the time revealed sinus arrhythmia with a heart rate in the 100s.  It occurred while walking with her service dog.  She last saw Dr. Caryl [facility] 04/2021 and  reported labile blood pressures ranging from the 90s to the 220s.  He recommended that she use her morning blood pressure to guide her antihypertensive regimen.  He recommended that she not take any medicine if her blood pressure is less than 110 in the morning and treat her BP more aggressively if her BP is >160.   She continued to struggle with labile blood pressures and recurrent syncope. One week prior she had a syncopal episode in the bedroom; her blood pressure at the time was 80/56. She had been salt loading without improvement. We added back her midodrine with recommendations for titration of her antihypertensives. She was instructed to check her BP daily and do the following: If SBP <130: midodrine 2.5mg  bid. If SBP 130-140: Midodrine 2.5mg , bisoprolol. If SBP 140-159: Midodrine, bisoprolol and losartan. If SBP >160: bisoprolol and losartan. She was referred to the syncope clinic at [facility] for dysautonomia. She saw Brittney Memos, NP 04/2022 for pre-op clearance and no further testing was needed.  At her last appointment she was struggling with stress and was recovering from several orthopedic surgeries. She had been diagnosed with dystonia. Her home blood pressures ranged [id number] systolic with mostly high readings. She continued to have neurocardiogenic syncope, passing out easily when her BP is in the 120s. She was referred to neurology for evaluation of possible seizures.  Today, she states she has been struggling with multiple life stressors. In the past few months she has underwent several orthopedic surgeries. Frequently  she suffers from fractures, and she was recently diagnosed with dystonia. She also continues to lose consciousness twice a week. Previously she had 2-3 seconds prior to passing out; lately she only has 1 second of warning. When she passed out recently, her BP was normal. She presents a BP log today, personally reviewed. This shows ranges of [id number] systolic, mostly with higher  readings. She continues to follow the recommendations for antihypertensive titration given to her at her last visit. For activity she recently planted a garden. This was also to grow produce for healthy smoothies. She states that her blood sugars have been high lately. Currently she is smoking 1 ppd. She no longer consumes alcohol. She denies any palpitations, chest pain, shortness of breath, or peripheral edema. No lightheadedness, headaches, orthopnea, or PND.  Today,  She denies any palpitations, chest pain, shortness of breath, or peripheral edema. No lightheadedness, headaches, syncope, orthopnea, or PND.  (+)  Previous antihypertensives: Metoprolol- nightmares Hydralazine  Past Medical History:  Diagnosis Date   ADHD (attention deficit hyperactivity disorder)    Alcohol abuse    Alcoholic pancreatitis [id number], 11/16, 7/17   Allergy    Anxiety    Arthritis    Bipolar disorder ([place]) 2    Cardiac arrest ([place]) 06-2015, 08-2016   x3- pt has pacemaker    Chronic pain    back   Complication of anesthesia    woke up during 2 surgical procedure hydterectomy and colonscopy   Depression    Diabetes mellitus without complication ([place])    Type 2   Esophageal mass 02/2019   has upper endo with [place] with biopsy   Fatty liver    Fracture of lateral malleolus of right fibula 06/2015   GERD (gastroesophageal reflux disease)    history ofHepatomegaly    Hyperlipidemia    Hypertension    Neurocardiogenic syncope    faints 2 x week   Neurocardiogenic syncope 09/13/2022   Neuromuscular disorder ([place])    Dystonia   OCD (obsessive compulsive disorder)    OCD (obsessive compulsive disorder)    Osteoporosis    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   Pancreatic pseudocyst    Panic attack    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, working on OB floor as a nurse   Syncope    neurocardiogenic   Third degree heart block ([place])     Past Surgical History:  Procedure  Laterality Date   ABDOMINAL HYSTERECTOMY  2009   ANTERIOR AND POSTERIOR REPAIR  2009   BONE EXCISION Right 07/08/2022   Procedure: PARTIAL FIBULA BONE EXCISION WITH I&D;  Surgeon: Renette Butters, MD;  Location: [place];  Service: Orthopedics;  Laterality: Right;   BUNIONECTOMY Bilateral    CESAREAN SECTION     EP IMPLANTABLE DEVICE N/A 10/12/2016   Procedure: Biotronik Pacemaker Implant;  Surgeon: Will Meredith Leeds, MD;  Location: [place] [facility];  Service: Cardiovascular;  Laterality: N/A;   finfger surgery Left    left middle fingger   FINGER SURGERY     HARDWARE REMOVAL Right 05/23/2022   Procedure: HARDWARE REMOVAL;  Surgeon: Marchia Bond, MD;  Location: [place];  Service: Orthopedics;  Laterality: Right;   I & D EXTREMITY Right 07/30/2022   Procedure: PARTIAL EXCISION RIGHT FIBULA;  Surgeon: Newt Minion, MD;  Location: [place];  Service: Orthopedics;  Laterality: Right;   INCISION AND DRAINAGE ABSCESS Right 05/23/2022   Procedure: INCISION AND DRAINAGE  ABSCESS, RIGHT ANKLE;  Surgeon: Marchia Bond, MD;  Location: [place];  Service: Orthopedics;  Laterality: Right;   LTCS     MYRINGOTOMY  1966   ORIF ANKLE FRACTURE Right 07/25/2015   Procedure: OPEN REDUCTION INTERNAL FIXATION (ORIF) RIGHT ANKLE ;  Surgeon: Renette Butters, MD;  Location: [place];  Service: Orthopedics;  Laterality: Right;   RHINOPLASTY     age 13   sub mandibular cyst removal from right side of tongue  2019   TONSILLECTOMY  1966   and adenoids removed   TUBAL LIGATION  2005    Current Medications: No outpatient medications have been marked as taking for the 03/16/23 encounter (Appointment) with Brittney Latch, MD.     Allergies:   Codeine, Demerol [meperidine], Jardiance [empagliflozin], Lopressor [metoprolol], and Norco [hydrocodone-acetaminophen]   Social History   Socioeconomic History   Marital status: Divorced    Spouse name: Not on file   Number of  children: Not on file   Years of education: Not on file   Highest education level: Not on file  Occupational History   Occupation: RN  Tobacco Use   Smoking status: Every Day    Packs/day: 1.00    Years: 15.00    Additional pack years: 0.00    Total pack years: 15.00    Types: Cigarettes   Smokeless tobacco: Never   Tobacco comments:    1/2 -1 ppd  Vaping Use   Vaping Use: Former   Substances: Nicotine  Substance and Sexual Activity   Alcohol use: Yes    Comment: social   Drug use: No   Sexual activity: Not Currently    Birth control/protection: Surgical, Abstinence    Comment: Hysterectomy  Other Topics Concern   Not on file  Social History Narrative   Not on file   Social Determinants of Health   Financial Resource Strain: Not on file  Food Insecurity: Not on file  Transportation Needs: Not on file  Physical Activity: Not on file  Stress: Not on file  Social Connections: Not on file    Family History: The patient's family history includes Colon cancer in her paternal grandfather; Colon polyps in her father and mother; Hyperlipidemia in her brother and sister; Hypertension in her brother, father, mother, and sister; Kidney disease in her mother; Kidney failure in her mother; Lung cancer in her father; Stomach cancer in her maternal grandfather; Stroke in her mother. There is no history of Rectal cancer, Esophageal cancer, or Pancreatic cancer.  ROS:   Please see the history of present illness.     All other systems reviewed and are negative.  EKGs/Labs/Other Studies Reviewed:    ABI Doppler  05/24/2022: Summary:  Right: Resting right ankle-brachial index is within normal range. No  evidence of significant right lower extremity arterial disease. The right  toe-brachial index is normal.   Left: Resting left ankle-brachial index is within normal range. No  evidence of significant left lower extremity arterial disease. The left  toe-brachial index is normal.    Bilateral Renal Artery Doppler  09/04/2020: Summary:  Largest Aortic Diameter: 1.9 cm     Renal:     Right: Normal size right kidney. Normal right Resistive Index.         Normal cortical thickness of right kidney. No evidence of         right renal artery stenosis. RRV flow present.  Left:  Normal size of left kidney. Normal left Resistive Index.  Normal cortical thickness of the left kidney. No evidence of         left renal artery stenosis. LRV flow present. Mild tortuosity         of the renal artery.  Mesenteric:  Normal Celiac artery and Superior Mesenteric artery findings.     Patent IVC.   Monitor  01/2020: Indication: syncope   Duration: 30d   Findings Recurrently SYncope assoc with sinus rhythm   Records of HR 180's is an error  Symptoms of Dizziness, LH, FLutters all assoc with sinus rhythm, sometimes regular and sometimes tachy   There was much Vpacing erroneously reported as VT, often assoc with concomitant Apacing, progressive AR intervals and then follow by pacing at various rates, presumably determined by the CLS algorithm   No significant arrhythmia noted    Echo  01/23/2020:  1. Left ventricular ejection fraction, by visual estimation, is 60 to  65%. The left ventricle has normal function. There is no left ventricular  hypertrophy.   2. The left ventricle has no regional wall motion abnormalities.   3. Global right ventricle has normal systolic function.The right  ventricular size is normal. No increase in right ventricular wall  thickness.   4. Left atrial size was normal.   5. Right atrial size was normal.   6. The mitral valve is normal in structure. Trivial mitral valve  regurgitation. No evidence of mitral stenosis.   7. The tricuspid valve is normal in structure.   8. The tricuspid valve is normal in structure. Tricuspid valve  regurgitation is not demonstrated.   9. The aortic valve is normal in structure. Aortic valve regurgitation is   not visualized. No evidence of aortic valve sclerosis or stenosis.  10. The pulmonic valve was normal in structure. Pulmonic valve  regurgitation is not visualized.  11. Mildly elevated pulmonary artery systolic pressure.  12. The inferior vena cava is normal in size with greater than 50%  respiratory variability, suggesting right atrial pressure of 3 mmHg.    EKG:  EKG is personally reviewed. 03/16/2023:  EKG was not ordered. 09/13/2022:  Atrial paced. Rate 84 bpm. 12/08/21: Sinus arrhytnmia rate 98 bpm.  Atrial pacing.  Nonspecific T wave abnoarmality  Recent Labs: 05/24/2022: ALT 21 07/02/2022: Platelets 284 07/08/2022: Hemoglobin 13.6 07/30/2022: BUN 21; Creatinine, Ser 0.88; Potassium 3.7; Sodium 138   Recent Lipid Panel    Component Value Date/Time   CHOL 212 (H) 11/30/2020 0809   TRIG 129 11/30/2020 0809   HDL 48 11/30/2020 0809   CHOLHDL 4.4 11/30/2020 0809   VLDL 26 11/30/2020 0809   LDLCALC 138 (H) 11/30/2020 0809    Physical Exam:    VS:  There were no vitals taken for this visit.    Wt Readings from Last 3 Encounters:  02/12/23 167 lb (75.8 kg)  09/13/22 175 lb 12.8 oz (79.7 kg)  07/30/22 170 lb (77.1 kg)    GENERAL:  Well appearing HEENT: Pupils equal round and reactive, fundi not visualized, oral mucosa unremarkable NECK:  No jugular venous distention, waveform within normal limits, carotid upstroke brisk and symmetric, no bruits, no thyromegaly LUNGS:  Clear to auscultation bilaterally HEART:  RRR.  PMI not displaced or sustained,S1 and S2 within normal limits, no S3, no S4, no clicks, no rubs, no murmurs ABD:  Flat, positive bowel sounds normal in frequency in pitch, no bruits, no rebound, no guarding, no midline pulsatile mass, no hepatomegaly, no splenomegaly EXT:  2 plus pulses throughout, no edema,  no cyanosis no clubbing SKIN:  No rashes no nodules NEURO:  Cranial nerves II through XII grossly intact, motor grossly intact throughout PSYCH:  Cognitively  intact, oriented to person place and time   ASSESSMENT:    No diagnosis found.  PLAN:    No problem-specific Assessment & Plan notes found for this encounter.  ***Plan: -  Disposition:    FU with MD/PharmD in ***6 months.  Medication Adjustments/Labs and Tests Ordered: Current medicines are reviewed at length with the patient today.  Concerns regarding medicines are outlined above.   No orders of the defined types were placed in this encounter.  No orders of the defined types were placed in this encounter.  I,Mathew Stumpf,acting as a Education administrator for Brittney Latch, MD.,have documented all relevant documentation on the behalf of Brittney Latch, MD,as directed by  Brittney Latch, MD while in the presence of [facility]ney Latch, MD.  I, Niland Oval Linsey, MD have reviewed all documentation for this visit.  The documentation of the exam, diagnosis, procedures, and orders on 03/16/2023 are all accurate and complete.  Waynetta Pean  03/16/2023 7:36 AM    [facility]

## 2023-04-07 ENCOUNTER — Ambulatory Visit (INDEPENDENT_AMBULATORY_CARE_PROVIDER_SITE_OTHER): Payer: 59

## 2023-04-07 DIAGNOSIS — I442 Atrioventricular block, complete: Secondary | ICD-10-CM

## 2023-04-07 LAB — CUP PACEART REMOTE DEVICE CHECK
Battery Voltage: 50
Date Time Interrogation Session: 20240411081329
Implantable Lead Connection Status: 753985
Implantable Lead Connection Status: 753985
Implantable Lead Implant Date: 20171017
Implantable Lead Implant Date: 20171017
Implantable Lead Location: 753859
Implantable Lead Location: 753860
Implantable Lead Model: 377
Implantable Lead Model: 377
Implantable Lead Serial Number: 49593512
Implantable Lead Serial Number: 49620910
Implantable Pulse Generator Implant Date: 20171017
Pulse Gen Model: 394969
Pulse Gen Serial Number: 68784192

## 2023-04-15 ENCOUNTER — Encounter (HOSPITAL_COMMUNITY): Payer: Self-pay | Admitting: Emergency Medicine

## 2023-04-15 ENCOUNTER — Other Ambulatory Visit: Payer: Self-pay

## 2023-04-15 ENCOUNTER — Emergency Department (HOSPITAL_COMMUNITY)
Admission: EM | Admit: 2023-04-15 | Discharge: 2023-04-16 | Disposition: A | Discharge status: Home / Self Care | Payer: 59 | Source: Home / Self Care | Attending: Emergency Medicine | Admitting: Emergency Medicine

## 2023-04-15 DIAGNOSIS — R000 Tachycardia, unspecified: Secondary | ICD-10-CM | POA: Insufficient documentation

## 2023-04-15 DIAGNOSIS — E1165 Type 2 diabetes mellitus with hyperglycemia: Secondary | ICD-10-CM | POA: Insufficient documentation

## 2023-04-15 DIAGNOSIS — F10930 Alcohol use, unspecified with withdrawal, uncomplicated: Secondary | ICD-10-CM | POA: Insufficient documentation

## 2023-04-15 DIAGNOSIS — Z1152 Encounter for screening for COVID-19: Secondary | ICD-10-CM | POA: Insufficient documentation

## 2023-04-15 DIAGNOSIS — F1090 Alcohol use, unspecified, uncomplicated: Secondary | ICD-10-CM

## 2023-04-15 DIAGNOSIS — F314 Bipolar disorder, current episode depressed, severe, without psychotic features: Secondary | ICD-10-CM | POA: Insufficient documentation

## 2023-04-15 DIAGNOSIS — R45851 Suicidal ideations: Secondary | ICD-10-CM | POA: Insufficient documentation

## 2023-04-15 DIAGNOSIS — Y907 Blood alcohol level of 200-239 mg/100 ml: Secondary | ICD-10-CM | POA: Insufficient documentation

## 2023-04-15 LAB — URINALYSIS, ROUTINE W REFLEX MICROSCOPIC
Bacteria, UA: NONE SEEN
Bilirubin Urine: NEGATIVE
Glucose, UA: >=500 mg/dL — AB
Hgb urine dipstick: NEGATIVE
Ketones, ur: NEGATIVE mg/dL
Leukocytes,Ua: NEGATIVE
Nitrite: NEGATIVE
Protein, ur: NEGATIVE mg/dL
Specific Gravity, Urine: 1.015 (ref 1.005–1.030)
pH: 6 (ref 5.0–8.0)

## 2023-04-15 LAB — CBC WITH DIFFERENTIAL/PLATELET
Abs Immature Granulocytes: 0.02 10*3/uL (ref 0.00–0.07)
Basophils Absolute: 0.1 10*3/uL (ref 0.0–0.1)
Basophils Relative: 1 %
Eosinophils Absolute: 0.1 10*3/uL (ref 0.0–0.5)
Eosinophils Relative: 2 %
HCT: 41.8 % (ref 36.0–46.0)
Hemoglobin: 14.7 g/dL (ref 12.0–15.0)
Immature Granulocytes: 0 %
Lymphocytes Relative: 52 %
Lymphs Abs: 2.4 10*3/uL (ref 0.7–4.0)
MCH: 33 pg (ref 26.0–34.0)
MCHC: 35.2 g/dL (ref 30.0–36.0)
MCV: 93.9 fL (ref 80.0–100.0)
Monocytes Absolute: 0.3 10*3/uL (ref 0.1–1.0)
Monocytes Relative: 6 %
Neutro Abs: 1.9 10*3/uL (ref 1.7–7.7)
Neutrophils Relative %: 39 %
Platelets: 386 10*3/uL (ref 150–400)
RBC: 4.45 MIL/uL (ref 3.87–5.11)
RDW: 13.8 % (ref 11.5–15.5)
WBC: 4.8 10*3/uL (ref 4.0–10.5)
nRBC: 0 % (ref 0.0–0.2)

## 2023-04-15 LAB — COMPREHENSIVE METABOLIC PANEL
ALT: 49 U/L — ABNORMAL HIGH (ref 0–44)
AST: 69 U/L — ABNORMAL HIGH (ref 15–41)
Albumin: 4.1 g/dL (ref 3.5–5.0)
Alkaline Phosphatase: 121 U/L (ref 38–126)
Anion gap: 17 — ABNORMAL HIGH (ref 5–15)
BUN: <5 mg/dL — ABNORMAL LOW (ref 6–20)
CO2: 21 mmol/L — ABNORMAL LOW (ref 22–32)
Calcium: 9.1 mg/dL (ref 8.9–10.3)
Chloride: 99 mmol/L (ref 98–111)
Creatinine, Ser: 0.73 mg/dL (ref 0.44–1.00)
GFR, Estimated: >60 mL/min (ref >60)
Glucose, Bld: 321 mg/dL — ABNORMAL HIGH (ref 70–99)
Potassium: 3.4 mmol/L — ABNORMAL LOW (ref 3.5–5.1)
Sodium: 137 mmol/L (ref 135–145)
Total Bilirubin: 0.6 mg/dL (ref 0.3–1.2)
Total Protein: 7 g/dL (ref 6.5–8.1)

## 2023-04-15 LAB — RAPID URINE DRUG SCREEN, HOSP PERFORMED
Amphetamines: NOT DETECTED
Barbiturates: NOT DETECTED
Benzodiazepines: POSITIVE — AB
Cocaine: NOT DETECTED
Opiates: NOT DETECTED
Tetrahydrocannabinol: NOT DETECTED

## 2023-04-15 LAB — ACETAMINOPHEN LEVEL: Acetaminophen (Tylenol), Serum: <10 ug/mL — ABNORMAL LOW (ref 10–30)

## 2023-04-15 LAB — BETA-HYDROXYBUTYRIC ACID: Beta-Hydroxybutyric Acid: 0.37 mmol/L — ABNORMAL HIGH (ref 0.05–0.27)

## 2023-04-15 LAB — SALICYLATE LEVEL: Salicylate Lvl: <7 mg/dL — ABNORMAL LOW (ref 7.0–30.0)

## 2023-04-15 LAB — ETHANOL: Alcohol, Ethyl (B): 215 mg/dL — ABNORMAL HIGH (ref <10)

## 2023-04-15 LAB — MAGNESIUM: Magnesium: 1.8 mg/dL (ref 1.7–2.4)

## 2023-04-15 LAB — CBG MONITORING, ED: Glucose-Capillary: 308 mg/dL — ABNORMAL HIGH (ref 70–99)

## 2023-04-15 MED ORDER — DIPHENHYDRAMINE HCL 25 MG PO TABS
25.0000 mg | ORAL_TABLET | Freq: Every evening | ORAL | Status: DC | PRN
  Administered 2023-04-15: 25 mg via ORAL
  Filled 2023-04-15: qty 1

## 2023-04-15 MED ORDER — ADULT MULTIVITAMIN W/MINERALS CH
1.0000 | ORAL_TABLET | Freq: Every day | ORAL | Status: DC
  Administered 2023-04-15 – 2023-04-16 (×2): 1 via ORAL
  Filled 2023-04-15 (×2): qty 1

## 2023-04-15 MED ORDER — THIAMINE MONONITRATE 100 MG PO TABS
100.0000 mg | ORAL_TABLET | Freq: Every day | ORAL | Status: DC
  Administered 2023-04-15 – 2023-04-16 (×2): 100 mg via ORAL
  Filled 2023-04-15 (×2): qty 1

## 2023-04-15 MED ORDER — LORAZEPAM 2 MG/ML IJ SOLN
0.0000 mg | Freq: Four times a day (QID) | INTRAMUSCULAR | Status: DC
  Administered 2023-04-15: 4 mg via INTRAVENOUS
  Filled 2023-04-15: qty 2

## 2023-04-15 MED ORDER — THIAMINE HCL 100 MG/ML IJ SOLN: 100.0000 mg | Freq: Every day | INTRAMUSCULAR | Status: DC

## 2023-04-15 MED ORDER — LORAZEPAM 1 MG PO TABS: 1.0000 mg | ORAL_TABLET | ORAL | Status: DC | PRN

## 2023-04-15 MED ORDER — LACTATED RINGERS IV BOLUS
1000.0000 mL | Freq: Once | INTRAVENOUS | Status: AC
  Administered 2023-04-15: 1000 mL via INTRAVENOUS

## 2023-04-15 MED ORDER — LORAZEPAM 1 MG PO TABS
2.0000 mg | ORAL_TABLET | Freq: Once | ORAL | Status: AC
  Administered 2023-04-15: 2 mg via ORAL
  Filled 2023-04-15: qty 2

## 2023-04-15 MED ORDER — LORAZEPAM 1 MG PO TABS
0.0000 mg | ORAL_TABLET | Freq: Four times a day (QID) | ORAL | Status: DC
  Administered 2023-04-15: 2 mg via ORAL
  Administered 2023-04-16: 1 mg via ORAL
  Filled 2023-04-15: qty 1
  Filled 2023-04-15: qty 2

## 2023-04-15 MED ORDER — DIAZEPAM 5 MG/ML IJ SOLN
2.5000 mg | Freq: Once | INTRAMUSCULAR | Status: AC
  Administered 2023-04-15: 2.5 mg via INTRAVENOUS
  Filled 2023-04-15: qty 2

## 2023-04-15 MED ORDER — LORAZEPAM 2 MG/ML IJ SOLN
0.0000 mg | Freq: Four times a day (QID) | INTRAMUSCULAR | Status: DC
  Administered 2023-04-16: 1 mg via INTRAVENOUS
  Filled 2023-04-15: qty 1

## 2023-04-15 MED ORDER — DOXAZOSIN MESYLATE 1 MG PO TABS: 1.0000 mg | ORAL_TABLET | Freq: Every day | ORAL | Status: DC

## 2023-04-15 MED ORDER — ROPINIROLE HCL 1 MG PO TABS
1.0000 mg | ORAL_TABLET | Freq: Every day | ORAL | Status: DC
  Administered 2023-04-15 – 2023-04-16 (×2): 1 mg via ORAL
  Filled 2023-04-15 (×2): qty 1

## 2023-04-15 MED ORDER — DOXAZOSIN MESYLATE 1 MG PO TABS
1.0000 mg | ORAL_TABLET | Freq: Every day | ORAL | Status: DC
  Administered 2023-04-15 – 2023-04-16 (×2): 1 mg via ORAL
  Filled 2023-04-15 (×2): qty 1

## 2023-04-15 MED ORDER — QUETIAPINE FUMARATE 100 MG PO TABS
100.0000 mg | ORAL_TABLET | Freq: Every day | ORAL | Status: DC
  Administered 2023-04-15 – 2023-04-16 (×2): 100 mg via ORAL
  Filled 2023-04-15 (×2): qty 1

## 2023-04-15 MED ORDER — ZIPRASIDONE MESYLATE 20 MG IM SOLR
10.0000 mg | Freq: Once | INTRAMUSCULAR | Status: AC
  Administered 2023-04-15: 10 mg via INTRAMUSCULAR
  Filled 2023-04-15: qty 20

## 2023-04-15 MED ORDER — LORAZEPAM 1 MG PO TABS
0.0000 mg | ORAL_TABLET | Freq: Four times a day (QID) | ORAL | Status: DC
  Filled 2023-04-15: qty 2

## 2023-04-15 MED ORDER — FLECAINIDE ACETATE 50 MG PO TABS
50.0000 mg | ORAL_TABLET | Freq: Two times a day (BID) | ORAL | Status: DC
  Administered 2023-04-15 – 2023-04-16 (×3): 50 mg via ORAL
  Filled 2023-04-15 (×4): qty 1

## 2023-04-15 MED ORDER — FOLIC ACID 1 MG PO TABS
1.0000 mg | ORAL_TABLET | Freq: Every day | ORAL | Status: DC
  Administered 2023-04-15 – 2023-04-16 (×2): 1 mg via ORAL
  Filled 2023-04-15 (×2): qty 1

## 2023-04-15 MED ORDER — GABAPENTIN 300 MG PO CAPS
300.0000 mg | ORAL_CAPSULE | Freq: Every day | ORAL | Status: DC
  Administered 2023-04-15 – 2023-04-16 (×2): 300 mg via ORAL
  Filled 2023-04-15 (×2): qty 1

## 2023-04-15 MED ORDER — METFORMIN HCL 500 MG PO TABS
1000.0000 mg | ORAL_TABLET | Freq: Two times a day (BID) | ORAL | Status: DC
  Administered 2023-04-15 – 2023-04-16 (×3): 1000 mg via ORAL
  Filled 2023-04-15 (×3): qty 2

## 2023-04-15 MED ORDER — DULOXETINE HCL 60 MG PO CPEP
60.0000 mg | ORAL_CAPSULE | Freq: Every morning | ORAL | Status: DC
  Administered 2023-04-16: 60 mg via ORAL
  Filled 2023-04-15: qty 1

## 2023-04-15 NOTE — ED Notes (Signed)
While asking SI questions, pt reports SI within the past month, then states "no, I know where this is going. I have been to [facility] twice. They don't do anything to help me. Honest answers, no I am not suicidal"

## 2023-04-15 NOTE — ED Notes (Signed)
IVC in progress

## 2023-04-15 NOTE — ED Notes (Signed)
IVC Completed, EXP: 04/22/23, 1 faxed to [facility], 1 in red Magistrate folder, 1 labeled and in medrec drawer, 3 copies on purple clipboard with labels in orange [facility] [place].

## 2023-04-15 NOTE — ED Provider Triage Note (Addendum)
Emergency Medicine Provider Triage Evaluation Note  [facility] , a 62 y.o. female  was evaluated in triage.  Pt complains of suicidal ideation and possible overdose.  She is intoxicated and history is not reliable.  EMS reports she was drinking several "gallons" of vodka today.  They also stated she took "40 mg of Dilaudid".  Patient denies this and states the only thing she took was 1 or 2 of her regular prescribed oxycodones.  Denies any other ingestions.  States she was suicidal and having pain all over..  Review of Systems  Positive: Suicidal ideation, chest pain, alcohol intoxication Negative: Nausea or vomiting  Physical Exam  There were no vitals taken for this visit. Gen:   Awake, no distress   Resp:  Normal effort  MSK:   Moves extremities without difficulty  Other:  Tearful, tachycardic  Medical Decision Making  Medically screening exam initiated at 12:25 PM.  Appropriate orders placed.  Helmut Muster was informed that the remainder of the evaluation will be completed by another provider, this initial triage assessment does not replace that evaluation, and the importance of remaining in the ED until their evaluation is complete.  Alcohol intoxication with possible ingestion of opiates.  Patient states he will think she took was 1 or 2 oxycodones.  Also is prescribed Xanax.   Glynn Octave, MD 04/15/23 1226  Patient attempting to leave the ED.  IVC paperwork completed as suicidal intent was not determined at this time   Glynn Octave, MD 04/15/23 1420

## 2023-04-15 NOTE — ED Triage Notes (Signed)
Pt BIB [facility] for ETOH with report of 2 gallon size empty vodka bottles, pt reports she drinks daily and possibly took 2  oxycodone today, pt tearful in triage and reports being upset due to son going away to college, pt further reports caregiver fatigue as she has been caring for 60 year old Father with stage 4 cancer, pt denies SI but states she "would be okay with dying", EDP at bedside for MSE

## 2023-04-15 NOTE — ED Provider Notes (Signed)
[place] AT [facility] Provider Note  Medical Decision Making   HPI: Brittney Cooper is a 62 y.o. female with history perinent alcohol use disorder, bipolar disorder, depression, reportedly 3 prior cardiac arrests patient, T2DM, OCD who presents complaining of alcohol withdrawal. Patient arrived via EMS.  History provided by patient.  No interpreter required for this encounter.  Patient intermittently tearful versus agitated, hitting her remote against the table during exam.  Reports that she is here for assistance with alcohol withdrawal.  Reports that she drinks to 2 gallons of vodka every 3 days at home, reports that she drank a similar amount this morning.  Reports that she took 47 of her home medications this morning, believes that she took some opioids.  Endorses numerous life stressors including taking care of of family members, and unable to attend her oldest son's second wedding, and her youngest children leaving home for college.  Reports that beginning in fall 2023 she started drinking daily.  Reports that when she attempts to discontinue alcohol use she has shakes, agitation, palpitations.  Reports that she abstained from alcohol for several days last week, and began experiencing auditory hallucinations which was worrisome for her.  Resolved when she began drinking again.  States that she presents today for help with alcohol withdrawal, endorses passive SI.  Patient does report decreased oral intake over the past several days including only have eaten a banana over the past 4 days.  ROS: As per HPI. Please see MAR for complete past medical history, surgical history, and social history.   Physical exam is pertinent for tachycardia, labile affect, agitation.   The differential includes but is not limited to intoxication, withdrawal, DTs, organic psychiatric disease, cognitive impairment, malingering, maladaptive coping mechanism, adjustment disorder, DKA,  alcoholic ketosis, starvation ketosis.  Additional history obtained from: none External records from outside source obtained and reviewed including: none  ED provider interpretation of ECG: rate 131, atrial placed rhythm, no ST elevations or depressions, T wave flattening in leads V1, aVL, lead III.  ED provider interpretation of radiology/imaging: Not indicated  Labs ordered were interpreted by myself as well as my attending and were incorporated into the medical decision making process for this patient.  ED provider interpretation of labs: CBC without leukocytosis, anemia, thrombocytopenia.  CMP with mildly increased anion gap at 17 and hyperglycemia to 321.  Acetaminophen, salicylate levels negative.  Ethanol elevated at 215.  Magnesium wnl.  Beta-hydroxybutyrate mildly elevated, concerning for DKA.  UA pending.  UDS pending.  Interventions: Ativan, LR bolus, folic acid, multivitamin, thiamine, home mediciations  See the EMR for full details regarding lab and imaging results.  On initial evaluation, patient does endorse passive SI, is intermittently tearful and agitated, agitation, tachycardia, tachypnea, patient's report of auditory hallucinations and motor laxness and palpitations with cessation of alcohol is consistent with alcohol withdrawal.  Patient does have passive SI.  IVC was taken out by Dr. Manus Gunning in triage.  Workup is reassuring, no evidence of DKA, likely combination of mild alcohol ketosis and starvation ketosis.  Patient without emergent electrolyte derangements.  Vitamins given for alcohol use as well as Ativan for withdrawal.  Patient did have some elevated CIWA's, placed on CIWA protocol with per protocol Ativan.  Home medications ordered.  Serial reevaluations performed, patient with overall improving agitation, though did require multiple doses of multiple medications.  Overall, feel that patient requires psychiatric hold, TTS consulted.  Will remain in the emergency  department under ED  psych hold pending TTS consult.  CIWA protocol continued.  No additional acute events while patient was under my care in the ED.   Consults: TTS  Post-ED Care: Per psychiatry, patient is IVC, patient will remain in the ED until he is cleared from a psychiatric perspective versus able to be transferred to another facility.  The plan for this patient was discussed with Dr. Particia Nearing, who voiced agreement and who oversaw evaluation and treatment of this patient.  Clinical Impression:  1. Alcohol use disorder   2. Alcohol withdrawal syndrome without complication   3. Suicidal ideation    Data Unavailable  Therapies: These medications and interventions were provided for the patient while in the ED. Medications  thiamine (VITAMIN B1) tablet 100 mg (100 mg Oral Given 04/15/23 1534)    Or  thiamine (VITAMIN B1) injection 100 mg ( Intravenous See Alternative 04/15/23 1534)  folic acid (FOLVITE) tablet 1 mg (1 mg Oral Given 04/15/23 1534)  multivitamin with minerals tablet 1 tablet (1 tablet Oral Given 04/15/23 1534)  DULoxetine (CYMBALTA) DR capsule 60 mg (has no administration in time range)  flecainide (TAMBOCOR) tablet 50 mg (50 mg Oral Given 04/15/23 2134)  gabapentin (NEURONTIN) capsule 300 mg (300 mg Oral Given 04/15/23 2128)  metFORMIN (GLUCOPHAGE) tablet 1,000 mg (1,000 mg Oral Given 04/15/23 2128)  diphenhydrAMINE (BENADRYL) tablet 25 mg (25 mg Oral Given 04/15/23 2129)  doxazosin (CARDURA) tablet 1 mg (1 mg Oral Given 04/15/23 2128)  QUEtiapine (SEROQUEL) tablet 100 mg (100 mg Oral Given 04/15/23 2130)  rOPINIRole (REQUIP) tablet 1 mg (1 mg Oral Given 04/15/23 2134)  LORazepam (ATIVAN) injection 0-4 mg ( Intravenous See Alternative 04/15/23 2250)    Or  LORazepam (ATIVAN) tablet 0-4 mg (2 mg Oral Given 04/15/23 2250)  lactated ringers bolus 1,000 mL (0 mLs Intravenous Stopped 04/15/23 1740)  LORazepam (ATIVAN) tablet 2 mg (2 mg Oral Given 04/15/23 1534)  diazepam (VALIUM)  injection 2.5 mg (2.5 mg Intravenous Given 04/15/23 1613)  ziprasidone (GEODON) injection 10 mg (10 mg Intramuscular Given 04/15/23 1829)    MDM generated using voice dictation software and may contain dictation errors.  Please contact me for any clarification or with any questions.  Clinical Complexity A medically appropriate history, review of systems, and physical exam was performed.  Collateral history obtained from: none I personally reviewed the labs, EKG, imaging as discussed above. Patient's presentation is most consistent with acute complicated illness / injury requiring diagnostic workup Treatment: IVC Medications: Prescription Discussed patient's care with providers from the following different specialties: TTS  Physical Exam   ED Triage Vitals [04/15/23 1228]  Enc Vitals Group     BP (!) 147/116     Pulse Rate (!) 119     Resp (!) 24     Temp 98.8 F (37.1 C)     Temp Source Oral     SpO2 96 %     Weight 167 lb (75.8 kg)     Height  (1.626 m)     Head Circumference      Peak Flow      Pain Score 5     Pain Loc      Pain Edu?      Excl. in GC?      Physical Exam Vitals and nursing note reviewed.  Constitutional:      General: She is not in acute distress.    Appearance: She is well-developed.  HENT:     Head: Normocephalic and atraumatic.  Eyes:     Conjunctiva/sclera: Conjunctivae normal.  Cardiovascular:     Rate and Rhythm: Regular rhythm. Tachycardia present.     Heart sounds: No murmur heard. Pulmonary:     Effort: Pulmonary effort is normal. No respiratory distress.     Breath sounds: Normal breath sounds.  Abdominal:     Palpations: Abdomen is soft.     Tenderness: There is no abdominal tenderness.  Musculoskeletal:        General: No swelling.     Cervical back: Neck supple.  Skin:    General: Skin is warm and dry.     Capillary Refill: Capillary refill takes less than 2 seconds.  Neurological:     Mental Status: She is alert.   Psychiatric:        Mood and Affect: Mood normal. Affect is labile, angry and tearful.        Behavior: Behavior is agitated.        Thought Content: Thought content includes suicidal ideation. Thought content does not include homicidal ideation. Thought content does not include homicidal or suicidal plan.       Procedure Note  Procedures  No orders to display    Julianne Rice, MD Emergency Medicine, PGY-2   Curley Spice, MD 04/16/23 0001    Jacalyn Lefevre, MD 04/18/23 1106

## 2023-04-16 ENCOUNTER — Inpatient Hospital Stay (HOSPITAL_COMMUNITY)
Admission: AD | Admit: 2023-04-16 | Discharge: 2023-04-22 | DRG: 885 | Disposition: A | Discharge status: Home / Self Care | Payer: 59 | Source: Intra-hospital | Attending: Psychiatry | Admitting: Psychiatry

## 2023-04-16 DIAGNOSIS — I471 Supraventricular tachycardia, unspecified: Secondary | ICD-10-CM | POA: Diagnosis present

## 2023-04-16 DIAGNOSIS — Z7984 Long term (current) use of oral hypoglycemic drugs: Secondary | ICD-10-CM | POA: Diagnosis not present

## 2023-04-16 DIAGNOSIS — F10230 Alcohol dependence with withdrawal, uncomplicated: Secondary | ICD-10-CM | POA: Diagnosis present

## 2023-04-16 DIAGNOSIS — I10 Essential (primary) hypertension: Secondary | ICD-10-CM | POA: Diagnosis present

## 2023-04-16 DIAGNOSIS — F1020 Alcohol dependence, uncomplicated: Secondary | ICD-10-CM | POA: Insufficient documentation

## 2023-04-16 DIAGNOSIS — F17210 Nicotine dependence, cigarettes, uncomplicated: Secondary | ICD-10-CM | POA: Diagnosis present

## 2023-04-16 DIAGNOSIS — Z8674 Personal history of sudden cardiac arrest: Secondary | ICD-10-CM

## 2023-04-16 DIAGNOSIS — Z8249 Family history of ischemic heart disease and other diseases of the circulatory system: Secondary | ICD-10-CM

## 2023-04-16 DIAGNOSIS — F314 Bipolar disorder, current episode depressed, severe, without psychotic features: Principal | ICD-10-CM | POA: Diagnosis present

## 2023-04-16 DIAGNOSIS — E119 Type 2 diabetes mellitus without complications: Secondary | ICD-10-CM | POA: Diagnosis present

## 2023-04-16 DIAGNOSIS — G2581 Restless legs syndrome: Secondary | ICD-10-CM | POA: Diagnosis present

## 2023-04-16 DIAGNOSIS — Z560 Unemployment, unspecified: Secondary | ICD-10-CM

## 2023-04-16 DIAGNOSIS — Z823 Family history of stroke: Secondary | ICD-10-CM

## 2023-04-16 DIAGNOSIS — Z818 Family history of other mental and behavioral disorders: Secondary | ICD-10-CM

## 2023-04-16 DIAGNOSIS — Z20822 Contact with and (suspected) exposure to covid-19: Secondary | ICD-10-CM | POA: Diagnosis present

## 2023-04-16 DIAGNOSIS — Z950 Presence of cardiac pacemaker: Secondary | ICD-10-CM

## 2023-04-16 DIAGNOSIS — R45851 Suicidal ideations: Secondary | ICD-10-CM | POA: Diagnosis present

## 2023-04-16 DIAGNOSIS — Z91199 Patient's noncompliance with other medical treatment and regimen due to unspecified reason: Secondary | ICD-10-CM

## 2023-04-16 DIAGNOSIS — F329 Major depressive disorder, single episode, unspecified: Secondary | ICD-10-CM | POA: Diagnosis present

## 2023-04-16 DIAGNOSIS — E785 Hyperlipidemia, unspecified: Secondary | ICD-10-CM | POA: Diagnosis present

## 2023-04-16 LAB — GLUCOSE, CAPILLARY: Glucose-Capillary: 353 mg/dL — ABNORMAL HIGH (ref 70–99)

## 2023-04-16 LAB — SARS CORONAVIRUS 2 BY RT PCR: SARS Coronavirus 2 by RT PCR: NEGATIVE

## 2023-04-16 MED ORDER — LORAZEPAM 1 MG PO TABS
2.0000 mg | ORAL_TABLET | Freq: Once | ORAL | Status: AC
  Administered 2023-04-16: 2 mg via ORAL
  Filled 2023-04-16: qty 2

## 2023-04-16 NOTE — Progress Notes (Signed)
Pt is accepted at [facility] TODAY 04/16/2023 per [facility] Rosey Bath, RN; bed assignment will bed provided by [facility].      Pt meets inpatient criteria per Ophelia Shoulder, NP   Attending Physician will be Dr. Phineas Inches, MD   Report can be called to: Adult unit: [phone]   Pt can arrive after: [facility] will coordinate with Care Team.   CARE TEAM notified: [facility] Rosey Bath, RN, [place], RN, Osvaldo Human, RN, Night [facility] [place], RN, Venda Rodes, [facility].   Maryjean Ka, MSW, [facility] 04/16/2023 5:04 PM

## 2023-04-16 NOTE — ED Provider Notes (Signed)
Emergency Medicine Observation Re-evaluation Note  Brittney Cooper is a 62 y.o. female with alcohol use disorder, bipolar, depression, and type 2 diabetes, seen on rounds today.  Pt initially presented to the ED for complaints of Alcohol Intoxication and suicidal ideation Currently, the patient has no acute complaints.  Physical Exam  BP (!) 148/105 (BP Location: Left Arm)   Pulse (!) 115   Temp 98.6 F (37 C) (Oral)   Resp 18   Ht  (1.626 m)   Wt 75.8 kg   SpO2 96%   BMI 28.67 kg/m  Physical Exam General: Resting comfortably in stretcher Lungs: Normal work of breathing Psych: Calm  ED Course / MDM  EKG:EKG Interpretation  Date/Time:  Friday April 15 2023 13:42:55 EDT Ventricular Rate:  131 PR Interval:  152 QRS Duration: 76 QT Interval:  292 QTC Calculation: 431 R Axis:   26 Text Interpretation: Atrial-paced rhythm Nonspecific ST abnormality Abnormal ECG When compared with ECG of 29-Nov-2020 15:58, PREVIOUS ECG IS PRESENT Since last tracing rate faster Confirmed by Jacalyn Lefevre [phone]) on 04/15/2023 3:16:13 PM  I have reviewed the labs performed to date as well as medications administered while in observation.  Recent changes in the last 24 hours include [facility] is requesting COVID test to be ordered.  Plan  Current plan is for inpatient psychiatry admission.  Will order COVID test to facilitate placement.  # Alcohol withdrawal Continue CIWA with PRN oral ativan   Rondel Baton, MD 04/16/23 2222
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
## 2023-04-16 NOTE — Progress Notes (Signed)
Pt is under review for acceptance at [facility] PENDING  admission date of TOMORROW 4/212/2024 or TODAY per [facility] AC Rosey Bath, RN if staffing improves within [facility].   Per [facility] AC Rosey Bath, RN has requested covid and IVC paperwork to be faxed to [facility] [phone]. Per nursing Lovie Macadamia, RN informed care team that covid has been ordered and IVC paperwork has been faxed.  Pt meets inpatient criteria per Roselyn Bering, NP  Attending Physician will be Dr. Phineas Inches, MD  Report can be called to: Adult unit: [phone]  Pt can arrive after: [facility] will coordinate with Care Team.  CARE TEAM notified: Day [facility] Rosey Bath, RN, Lucas, RN, Osvaldo Human, RN, Night [facility], RN, Venda Rodes, [facility],    [place], [place] 04/16/2023 @ 2:01 PM

## 2023-04-16 NOTE — ED Notes (Signed)
Kingsley Plan (Sister) called and ask if there is a need for med. question to call at [phone].

## 2023-04-16 NOTE — ED Notes (Signed)
TTS in process 

## 2023-04-17 ENCOUNTER — Encounter (HOSPITAL_COMMUNITY): Payer: Self-pay | Admitting: Adult Health

## 2023-04-17 ENCOUNTER — Other Ambulatory Visit: Payer: Self-pay

## 2023-04-17 DIAGNOSIS — F314 Bipolar disorder, current episode depressed, severe, without psychotic features: Principal | ICD-10-CM | POA: Insufficient documentation

## 2023-04-17 DIAGNOSIS — F329 Major depressive disorder, single episode, unspecified: Secondary | ICD-10-CM | POA: Diagnosis present

## 2023-04-17 DIAGNOSIS — F1020 Alcohol dependence, uncomplicated: Secondary | ICD-10-CM | POA: Insufficient documentation

## 2023-04-17 LAB — GLUCOSE, CAPILLARY
Glucose-Capillary: 177 mg/dL — ABNORMAL HIGH (ref 70–99)
Glucose-Capillary: 361 mg/dL — ABNORMAL HIGH (ref 70–99)
Glucose-Capillary: 367 mg/dL — ABNORMAL HIGH (ref 70–99)
Glucose-Capillary: 146 mg/dL — ABNORMAL HIGH (ref 70–99)

## 2023-04-17 MED ORDER — ARIPIPRAZOLE 5 MG PO TABS
5.0000 mg | ORAL_TABLET | Freq: Every day | ORAL | Status: AC
  Administered 2023-04-17 – 2023-04-18 (×2): 5 mg via ORAL
  Filled 2023-04-17 (×3): qty 1

## 2023-04-17 MED ORDER — ROPINIROLE HCL 1 MG PO TABS
1.0000 mg | ORAL_TABLET | Freq: Every day | ORAL | Status: DC
  Administered 2023-04-17 – 2023-04-21 (×5): 1 mg via ORAL
  Filled 2023-04-17 (×8): qty 1

## 2023-04-17 MED ORDER — LORAZEPAM 1 MG PO TABS
0.0000 mg | ORAL_TABLET | Freq: Four times a day (QID) | ORAL | Status: DC
  Administered 2023-04-17 (×2): 1 mg via ORAL
  Administered 2023-04-17: 2 mg via ORAL
  Filled 2023-04-17 (×4): qty 1

## 2023-04-17 MED ORDER — FLECAINIDE ACETATE 50 MG PO TABS
50.0000 mg | ORAL_TABLET | Freq: Two times a day (BID) | ORAL | Status: DC
  Administered 2023-04-17 – 2023-04-22 (×11): 50 mg via ORAL
  Filled 2023-04-17 (×15): qty 1

## 2023-04-17 MED ORDER — MAGNESIUM HYDROXIDE 400 MG/5ML PO SUSP: 30.0000 mL | Freq: Every day | ORAL | Status: DC | PRN

## 2023-04-17 MED ORDER — LORAZEPAM 1 MG PO TABS
1.0000 mg | ORAL_TABLET | Freq: Three times a day (TID) | ORAL | Status: AC
  Administered 2023-04-17 (×3): 1 mg via ORAL
  Filled 2023-04-17 (×2): qty 1

## 2023-04-17 MED ORDER — NICOTINE 21 MG/24HR TD PT24
MEDICATED_PATCH | TRANSDERMAL | Status: AC
  Filled 2023-04-17: qty 1

## 2023-04-17 MED ORDER — METFORMIN HCL 500 MG PO TABS
1000.0000 mg | ORAL_TABLET | Freq: Two times a day (BID) | ORAL | Status: DC
  Administered 2023-04-17 – 2023-04-22 (×11): 1000 mg via ORAL
  Filled 2023-04-17 (×16): qty 2

## 2023-04-17 MED ORDER — INSULIN ASPART 100 UNIT/ML IJ SOLN
5.0000 [IU] | Freq: Once | INTRAMUSCULAR | Status: AC
  Administered 2023-04-17: 5 [IU] via SUBCUTANEOUS

## 2023-04-17 MED ORDER — ARIPIPRAZOLE 10 MG PO TABS
10.0000 mg | ORAL_TABLET | Freq: Every day | ORAL | Status: DC
  Administered 2023-04-19 – 2023-04-22 (×4): 10 mg via ORAL
  Filled 2023-04-17 (×6): qty 1

## 2023-04-17 MED ORDER — DOXAZOSIN MESYLATE 1 MG PO TABS
1.0000 mg | ORAL_TABLET | Freq: Every day | ORAL | Status: DC
  Administered 2023-04-17 – 2023-04-21 (×5): 1 mg via ORAL
  Filled 2023-04-17 (×7): qty 1

## 2023-04-17 MED ORDER — HYDROXYZINE HCL 25 MG PO TABS
25.0000 mg | ORAL_TABLET | Freq: Three times a day (TID) | ORAL | Status: DC | PRN
  Administered 2023-04-18 – 2023-04-22 (×11): 25 mg via ORAL
  Filled 2023-04-17 (×11): qty 1

## 2023-04-17 MED ORDER — GABAPENTIN 300 MG PO CAPS
300.0000 mg | ORAL_CAPSULE | Freq: Two times a day (BID) | ORAL | Status: DC
  Administered 2023-04-17 – 2023-04-22 (×10): 300 mg via ORAL
  Filled 2023-04-17 (×14): qty 1

## 2023-04-17 MED ORDER — QUETIAPINE FUMARATE 100 MG PO TABS
100.0000 mg | ORAL_TABLET | Freq: Every day | ORAL | Status: DC
  Filled 2023-04-17: qty 1

## 2023-04-17 MED ORDER — BISOPROLOL FUMARATE 5 MG PO TABS
5.0000 mg | ORAL_TABLET | Freq: Every day | ORAL | Status: DC
  Administered 2023-04-19 – 2023-04-22 (×4): 5 mg via ORAL
  Filled 2023-04-17 (×7): qty 1

## 2023-04-17 MED ORDER — CLONIDINE HCL 0.1 MG PO TABS
0.1000 mg | ORAL_TABLET | Freq: Once | ORAL | Status: AC
  Administered 2023-04-17: 0.1 mg via ORAL
  Filled 2023-04-17 (×2): qty 1

## 2023-04-17 MED ORDER — DULOXETINE HCL 60 MG PO CPEP
60.0000 mg | ORAL_CAPSULE | Freq: Every day | ORAL | Status: DC
  Administered 2023-04-17: 60 mg via ORAL
  Filled 2023-04-17 (×3): qty 1

## 2023-04-17 MED ORDER — ALUM & MAG HYDROXIDE-SIMETH 200-200-20 MG/5ML PO SUSP: 30.0000 mL | ORAL | Status: DC | PRN

## 2023-04-17 MED ORDER — LORAZEPAM 2 MG/ML IJ SOLN: 0.0000 mg | Freq: Four times a day (QID) | INTRAMUSCULAR | Status: DC

## 2023-04-17 MED ORDER — LORAZEPAM 0.5 MG PO TABS
0.5000 mg | ORAL_TABLET | Freq: Two times a day (BID) | ORAL | Status: AC
  Administered 2023-04-19 (×2): 0.5 mg via ORAL
  Filled 2023-04-17 (×2): qty 1

## 2023-04-17 MED ORDER — GABAPENTIN 300 MG PO CAPS
300.0000 mg | ORAL_CAPSULE | Freq: Every day | ORAL | Status: DC
  Filled 2023-04-17: qty 1

## 2023-04-17 MED ORDER — THIAMINE HCL 100 MG/ML IJ SOLN: 100.0000 mg | Freq: Every day | INTRAMUSCULAR | Status: DC

## 2023-04-17 MED ORDER — LORAZEPAM 1 MG PO TABS
1.0000 mg | ORAL_TABLET | Freq: Two times a day (BID) | ORAL | Status: AC
  Administered 2023-04-18 (×2): 1 mg via ORAL
  Filled 2023-04-17 (×2): qty 1

## 2023-04-17 MED ORDER — VITAMIN B-1 100 MG PO TABS
100.0000 mg | ORAL_TABLET | Freq: Every day | ORAL | Status: DC
  Administered 2023-04-17 – 2023-04-22 (×6): 100 mg via ORAL
  Filled 2023-04-17 (×9): qty 1

## 2023-04-17 MED ORDER — LOSARTAN POTASSIUM 25 MG PO TABS
25.0000 mg | ORAL_TABLET | Freq: Every day | ORAL | Status: DC
  Administered 2023-04-17 – 2023-04-22 (×6): 25 mg via ORAL
  Filled 2023-04-17 (×10): qty 1

## 2023-04-17 MED ORDER — DIPHENHYDRAMINE HCL 25 MG PO CAPS
25.0000 mg | ORAL_CAPSULE | Freq: Every evening | ORAL | Status: DC | PRN
  Administered 2023-04-17 – 2023-04-21 (×3): 25 mg via ORAL
  Filled 2023-04-17 (×3): qty 1

## 2023-04-17 MED ORDER — FOLIC ACID 1 MG PO TABS
1.0000 mg | ORAL_TABLET | Freq: Every day | ORAL | Status: DC
  Administered 2023-04-17 – 2023-04-22 (×6): 1 mg via ORAL
  Filled 2023-04-17 (×9): qty 1

## 2023-04-17 MED ORDER — ADULT MULTIVITAMIN W/MINERALS CH
1.0000 | ORAL_TABLET | Freq: Every day | ORAL | Status: DC
  Administered 2023-04-17 – 2023-04-22 (×6): 1 via ORAL
  Filled 2023-04-17 (×9): qty 1

## 2023-04-17 MED ORDER — INSULIN ASPART 100 UNIT/ML IJ SOLN
0.0000 [IU] | Freq: Three times a day (TID) | INTRAMUSCULAR | Status: DC
  Administered 2023-04-17 – 2023-04-18 (×2): 15 [IU] via SUBCUTANEOUS
  Administered 2023-04-18: 5 [IU] via SUBCUTANEOUS
  Administered 2023-04-18 – 2023-04-19 (×2): 3 [IU] via SUBCUTANEOUS
  Administered 2023-04-19: 8 [IU] via SUBCUTANEOUS
  Administered 2023-04-19: 5 [IU] via SUBCUTANEOUS
  Administered 2023-04-20: 8 [IU] via SUBCUTANEOUS
  Administered 2023-04-20: 3 [IU] via SUBCUTANEOUS
  Administered 2023-04-21: 5 [IU] via SUBCUTANEOUS
  Administered 2023-04-21: 3 [IU] via SUBCUTANEOUS
  Administered 2023-04-21: 5 [IU] via SUBCUTANEOUS
  Administered 2023-04-22: 3 [IU] via SUBCUTANEOUS

## 2023-04-17 MED ORDER — NICOTINE 21 MG/24HR TD PT24
21.0000 mg | MEDICATED_PATCH | Freq: Every day | TRANSDERMAL | Status: DC
  Administered 2023-04-17 – 2023-04-22 (×6): 21 mg via TRANSDERMAL
  Filled 2023-04-17 (×7): qty 1

## 2023-04-17 MED ORDER — TRAZODONE HCL 50 MG PO TABS
50.0000 mg | ORAL_TABLET | Freq: Every evening | ORAL | Status: DC | PRN
  Administered 2023-04-17 – 2023-04-19 (×3): 50 mg via ORAL
  Filled 2023-04-17 (×3): qty 1

## 2023-04-17 NOTE — Progress Notes (Signed)
Pt complaining of feeling panicky, although observed sitting in the dayroom with peers- CIWA rated a 10 per pt's symptoms- BP 174/117, HR 123. Scheduled meds given and MD notified . Per MD verbal order, pt administered a one time dose of 0.1 mg clonidine po, recheck BP in one hour.

## 2023-04-17 NOTE — Progress Notes (Signed)
Patient ID: Brittney Cooper, female   DOB: 12-06-1961, 62 y.o.   MRN: [id number] Admission note: Patient is a Involuntary admission in no acute distress for depression and binge drinking. Pt stated she drinks about 42 oz of vodka a day and will not drink again for another week. Pt stated she is stress financially and has trouble getting basic chores done. Pt stated she wants to get back on medication and also need help with drinking too much. Pt is endorsing no past suicidal attempt by self harm.  Pt admitted to unit per protocol, skin assessment and belonging search done. No skin issues noted. Consent signed by pt. Pt educated on therapeutic milieu rules. Pt was introduced to milieu by nursing staff. Fall risk / suicide safety plan explained to the patient. 15 minutes checks started for safety.

## 2023-04-17 NOTE — Progress Notes (Signed)
Pt reported feeling better- BP decreased to 136/92 HR 115

## 2023-04-17 NOTE — BHH Group Notes (Signed)
Adult Psychoeducational Group Note  Date:  04/17/2023 Time:  8:44 PM  Group Topic/Focus:  Wrap-Up Group:   The focus of this group is to help patients review their daily goal of treatment and discuss progress on daily workbooks.  Participation Level:  Active  Participation Quality:  Appropriate  Affect:  Appropriate  Cognitive:  Appropriate  Insight: Appropriate  Engagement in Group:  Engaged  Modes of Intervention:  Education  Additional Comments:  Pt goal today was to experience less anxiety plus be more positive about this process. Pt wants to work on a discharge planning tomorrow for a goal.  Ayona Yniguez, Sharen Counter 04/17/2023, 8:44 PM

## 2023-04-17 NOTE — Progress Notes (Addendum)
P  04/17/23 1500  Psych Admission Type (Psych Patients Only)  Admission Status Voluntary  Psychosocial Assessment  Patient Complaints Anxiety  Eye Contact Fair  Facial Expression Animated  Affect Anxious  Speech Logical/coherent  Interaction Assertive  Motor Activity Slow  Appearance/Hygiene Disheveled  Behavior Characteristics Cooperative  Mood Depressed;Anxious  Thought Process  Coherency WDL  Content WDL  Delusions None reported or observed  Perception WDL  Hallucination None reported or observed  Judgment Impaired  Confusion None  Danger to Self  Current suicidal ideation? Denies  Danger to Others  Danger to Others None reported or observed

## 2023-04-17 NOTE — Tx Team (Signed)
Initial Treatment Plan 04/17/2023 4:39 AM Brittney Cooper RUE:[id number]    PATIENT STRESSORS: Financial difficulties   Health problems   Medication change or noncompliance     PATIENT STRENGTHS: Ability for insight  Active sense of humor  General fund of knowledge  Motivation for treatment/growth    PATIENT IDENTIFIED PROBLEMS:   depression  anxiety  Binge drinking  Need to get back on medication"  "Need help with ETOH"           DISCHARGE CRITERIA:  Improved stabilization in mood, thinking, and/or behavior Medical problems require only outpatient monitoring Motivation to continue treatment in a less acute level of care Need for constant or close observation no longer present Reduction of life-threatening or endangering symptoms to within safe limits Safe-care adequate arrangements made Verbal commitment to aftercare and medication compliance  PRELIMINARY DISCHARGE PLAN: Attend aftercare/continuing care group Attend PHP/IOP Attend 12-step recovery group Outpatient therapy  PATIENT/FAMILY INVOLVEMENT: This treatment plan has been presented to and reviewed with the patient, Brittney Cooper,  The patient and family have been given the opportunity to ask questions and make suggestions.  Earnest Conroy, RN 04/17/2023, 4:39 AM

## 2023-04-17 NOTE — H&P (Signed)
Psychiatric Admission Assessment Adult  Patient Identification: Brittney Cooper MRN:  [id number] Date of Evaluation:  04/17/2023  Chief Complaint:  MDD (major depressive disorder) [F32.9]  History of Present Illness:  Brittney Cooper is a 62 y.o., female with a past psychiatric history significant for bipolar disorder, alcohol use disorder who presents to the [facility] from [facility] emergency room for evaluation and management of increased depression and SI, alcohol dependence.  According to outside records, the patient patient presented there reporting drinking daily recently also possibly took too 10 mg oxycodone presented tearful reporting passive SI.  Initial assessment on 04/17/2023, patient was evaluated on the inpatient unit, the patient reports history of diagnosed bipolar disorder type II for the past 5 years for which she receives treatment by outpatient psychiatrist, lives alone, "my life has become unmanageable" reports increasing stress recently related to finances as well as being a caregiver for her 35 years old father with cancer.  Reports for the past 2 months increased depression decreased sleep decreased appetite lost 10 pounds in the past 1 to 2 months decreased energy and motivation decreased interest decreased ability to focus and concentrate increasing feeling hopeless helpless and worthlessness, reports passive SI wishing self dead "I do not care if I die" denies active SI intention or plan denies HI denies AVH denies paranoia or other delusions.  She does report symptoms consistent with mania last time few days ago lasted only 1 day but also reports symptoms 2 months ago lasted 3 weeks when she had increased energy increased amount and speed of speech, racing thoughts, impulsive act with shopping sprees and decreased ability to sleep secondary to increased energy reports increasing cleaning during these episodes of mania.  She denies symptoms consistent with PTSD.     She reports binge drinking over the past few months, was drinking daily over the past few days unable to specify amount but in emergency room notes it was noted that she drinks 42 ounce of vodka on daily basis.  She does admit to withdrawal tremors as well as blackouts.  She denies any current withdrawals but CIWA score since admitted yesterday 8, 9, 5 then 15 with questionable accuracy.  During evaluation patient presents with mild tremors but no diaphoresis or irritability or anxiety noted.  Patient denies craving alcohol. Patient reports on outpatient basis prescribed Xanax 3 times daily as needed for anxiety using average once to twice daily and denies any misuse or abuse, also on Seroquel 300 mg at bedtime for mood and sleep, Cymbalta 60 mg daily for depression which she reports has not been effective recently, she denies any recent treatment with mood stabilizers or antipsychotics.  Questionable compliance with home medication regimen regard psychiatric medication as well as medications for medical problems.  Chart review: CIWA score reviewed as noted above, [facility] indicates prescriptions of Xanax last time prescribed 12/2488 tablets / 30 days 0.5 mg, also prescription for Adderall last time filled 02/1429 mg, 30 tablets / 30 days, oxycodone last time filled August/23.  Psych meds prior to admission:  Xanax 0.5 mg 3 times daily as needed for anxiety using average once to twice daily, Cymbalta 60 mg daily, also reports being on Seroquel 300 mg at bedtime  Sleep Sleep: For sleep prior to admission  Collateral information: None at this time  Past Psychiatric History:  Prior Psychiatric diagnoses: Bipolar disorder type II diagnoses given 5 years ago per patient's report Past Psychiatric Hospitalizations: 2 previous hospitalization first time at [facility] her 38[facility], second time 6 years ago at [facility]  History of self mutilation: Denies Past suicide attempts: Denies Past  history of HI, violent or aggressive behavior: Denies  Past Psychiatric medications trials: Reports tried Abilify 3 years ago but unsure regarding result of treatment, tried Depakote in the past and reported caused "blood sugar fluctuates" History of ECT/TMS: Denies  Outpatient psychiatric Follow up: Sees Dr. Madaline Guthrie at [facility] every 3 months, last time seen in January Prior Outpatient Therapy: Sees a counselor at same facility last time 1 month ago    Is the patient at risk to self? Yes.    Has the patient been a risk to self in the past 6 months? No.  Has the patient been a risk to self within the distant past? No.  Is the patient a risk to others? No.  Has the patient been a risk to others in the past 6 months? No.  Has the patient been a risk to others within the distant past? No.    Substance Use History: Alcohol: Binge drinking on and off but drinking daily over the past week fluctuating amount of vodka, with history of withdrawal tremors and blackouts but denies any history of withdrawal seizures Tobacoo: Smokes 1 and half pack of cigarette daily Marijuana: Denies Cocaine: Denies Stimulants: Denies IV drug use: Denies Opiates: Denies Prescribed Meds abuse: Denies misuse or abuse of prescribed medications H/O withdrawals, blackouts, DTs: History of withdrawal tremors and blackouts History of Detox / Rehab: Denies DUI: Denies  Alcohol Screening: 1. How often do you have a drink containing alcohol?: 2 to 3 times a week 2. How many drinks containing alcohol do you have on a typical day when you are drinking?: 3 or 4 3. How often do you have six or more drinks on one occasion?: Weekly AUDIT-C Score: 7 4. How often during the last year have you found that you were not able to stop drinking once you had started?: Weekly 5. How often during the last year have you failed to do what was normally expected from you because of drinking?: Weekly 6. How often during  the last year have you needed a first drink in the morning to get yourself going after a heavy drinking session?: Weekly 7. How often during the last year have you had a feeling of guilt of remorse after drinking?: Daily or almost daily 8. How often during the last year have you been unable to remember what happened the night before because you had been drinking?: Weekly 9. Have you or someone else been injured as a result of your drinking?: No 10. Has a relative or friend or a doctor or another health worker been concerned about your drinking or suggested you cut down?: Yes, during the last year Alcohol Use Disorder Identification Test Final Score (AUDIT): 27 Alcohol Brief Interventions/Follow-up: Alcohol education/Brief advice  Substance Abuse History in the last 12 months:  Yes.     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   Allergy    Anxiety    Arthritis    Bipolar disorder (HCC) 2    Cardiac arrest 06-2015, 08-2016   x3- pt has pacemaker    Chronic pain    back   Complication of anesthesia  woke up during 2 surgical procedure hydterectomy and colonscopy   Depression    Diabetes mellitus without complication    Type 2   Esophageal mass 02/2019   has upper endo with [place] with biopsy   Fatty liver    Fracture of lateral malleolus of right fibula 06/2015   GERD (gastroesophageal reflux disease)    history ofHepatomegaly    Hyperlipidemia    Hypertension    Neurocardiogenic syncope    faints 2 x week   Neurocardiogenic syncope 09/13/2022   Neuromuscular disorder    Dystonia   OCD (obsessive compulsive disorder)    OCD (obsessive compulsive disorder)    Osteoporosis    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   Pancreatic pseudocyst    Panic attack    PTSD (post-traumatic stress disorder)     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, working on OB floor as a nurse   Syncope    neurocardiogenic   Third degree heart block     Past Surgical History:  Procedure Laterality Date   ABDOMINAL HYSTERECTOMY  2009   ANTERIOR AND POSTERIOR REPAIR  2009   BONE EXCISION Right 07/08/2022   Procedure: PARTIAL FIBULA BONE EXCISION WITH I&D;  Surgeon: Sheral Apley, MD;  Location: [facility];  Service: Orthopedics;  Laterality: Right;   BUNIONECTOMY Bilateral    CESAREAN SECTION     EP IMPLANTABLE DEVICE N/A 10/12/2016   Procedure: Biotronik Pacemaker Implant;  Surgeon: Will Jorja Loa, MD;  Location: [facility];  Service: Cardiovascular;  Laterality: N/A;   finfger surgery Left    left middle fingger   FINGER SURGERY     HARDWARE REMOVAL Right 05/23/2022   Procedure: HARDWARE REMOVAL;  Surgeon: Teryl Lucy, MD;  Location: [facility] OR;  Service: Orthopedics;  Laterality: Right;   I & D EXTREMITY Right 07/30/2022   Procedure: PARTIAL EXCISION RIGHT FIBULA;  Surgeon: Nadara Mustard, MD;  Location: [facility] OR;  Service: Orthopedics;  Laterality: Right;   INCISION AND DRAINAGE ABSCESS Right 05/23/2022   Procedure: INCISION AND DRAINAGE ABSCESS, RIGHT ANKLE;  Surgeon: Teryl Lucy, MD;  Location: [facility] OR;  Service: Orthopedics;  Laterality: Right;   LTCS     MYRINGOTOMY  1966   ORIF ANKLE FRACTURE Right 07/25/2015   Procedure: OPEN REDUCTION INTERNAL FIXATION (ORIF) RIGHT ANKLE ;  Surgeon: Sheral Apley, MD;  Location: [facility];  Service: Orthopedics;  Laterality: Right;   RHINOPLASTY     age 82   sub mandibular cyst removal from right side of tongue  2019   TONSILLECTOMY  1966   and adenoids removed   TUBAL LIGATION  2005    Family History:  Family History  Problem Relation Age of Onset   Stroke Mother    Kidney disease Mother    Colon polyps Mother    Hypertension Mother    Kidney failure Mother    Lung cancer Father        stage IV    Colon  polyps Father    Hypertension Father    Stomach cancer Maternal Grandfather    Colon cancer Paternal Grandfather    Hypertension Sister    Hyperlipidemia Sister    Hypertension Brother    Hyperlipidemia Brother    Rectal cancer Neg Hx    Esophageal cancer Neg Hx    Pancreatic cancer Neg Hx     Family Psychiatric History:  Psychiatric illness: Father and sister have depression Suicide: Denies Substance Abuse: Father  and son have alcohol-related problems  Social History:  Social History   Substance and Sexual Activity  Alcohol Use Yes   Comment: daily     Social History   Substance and Sexual Activity  Drug Use No    Living situation: Lives alone in [place] in a house Social support: Limited Marital Status: Was married 3 times, divorced for 14 years Children: 5 children 3 sons and 2 daughters age 81-40 Education: Patient reports she has degree in nursing Employment: Unemployed on disability 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 problems Military service: Denies Legal history: Denies pending charges or court dates Trauma: Denies Access to guns: Denies   Allergies:   Allergies  Allergen Reactions   Codeine Nausea And Vomiting    Pt stated she also thought it made her hallucinate   Demerol [Meperidine] Other (See Comments)    Hallucinations    Lopressor [Metoprolol] Other (See Comments)    Nightmares    Norco [Hydrocodone-Acetaminophen] Nausea And Vomiting    Pt reports Vicodin caused "vomiting for six hours," reports being able to tolerate oxycodone and tramadol. Can take tylenol   Jardiance [Empagliflozin] Other (See Comments)    Yeast infections    Lab Results:  Results for orders placed or performed during the hospital encounter of 04/16/23 (from the past 48 hour(s))  Glucose, capillary     Status: Abnormal   Collection Time: 04/16/23 11:54 PM  Result Value Ref Range   Glucose-Capillary 353 (H) 70 - 99 mg/dL    Comment: Glucose reference range  applies only to samples taken after fasting for at least 8 hours.  Glucose, capillary     Status: Abnormal   Collection Time: 04/17/23  5:33 AM  Result Value Ref Range   Glucose-Capillary 177 (H) 70 - 99 mg/dL    Comment: Glucose reference range applies only to samples taken after fasting for at least 8 hours.  Glucose, capillary     Status: Abnormal   Collection Time: 04/17/23 12:08 PM  Result Value Ref Range   Glucose-Capillary 361 (H) 70 - 99 mg/dL    Comment: Glucose reference range applies only to samples taken after fasting for at least 8 hours.    Blood Alcohol level:  Lab Results  Component Value Date   ETH 215 (H) 04/15/2023   ETH 112 (H) 02/03/2017    Metabolic Disorder Labs:  Lab Results  Component Value Date   HGBA1C 8.3 (H) 05/24/2022   MPG 191.51 05/24/2022   MPG 211.6 11/30/2020   No results found for: "PROLACTIN" Lab Results  Component Value Date   CHOL 212 (H) 11/30/2020   TRIG 129 11/30/2020   HDL 48 11/30/2020   CHOLHDL 4.4 11/30/2020   VLDL 26 11/30/2020   LDLCALC 138 (H) 11/30/2020   LDLCALC 31 09/16/2016    Current Medications: Current Facility-Administered Medications  Medication Dose Route Frequency Provider Last Rate Last Admin   alum & mag hydroxide-simeth (MAALOX/MYLANTA) 200-200-20 MG/5ML suspension 30 mL  30 mL Oral Q4H PRN Massengill, Harrold Donath, MD       ARIPiprazole (ABILIFY) tablet 5 mg  5 mg Oral Daily Yasheka Fossett, MD       Followed by   Melene Muller ON 04/19/2023] ARIPiprazole (ABILIFY) tablet 10 mg  10 mg Oral Daily Sada Mazzoni, MD       diphenhydrAMINE (BENADRYL) capsule 25 mg  25 mg Oral QHS PRN Phineas Inches, MD   25 mg at 04/17/23 0102   doxazosin (CARDURA) tablet  1 mg  1 mg Oral Daily Massengill, Harrold Donath, MD       flecainide (TAMBOCOR) tablet 50 mg  50 mg Oral BID Phineas Inches, MD   50 mg at 04/17/23 1029   folic acid (FOLVITE) tablet 1 mg  1 mg Oral Daily Massengill, Nathan, MD   1 mg at 04/17/23 0816   gabapentin  (NEURONTIN) capsule 300 mg  300 mg Oral BID Sarita Bottom, MD       hydrOXYzine (ATARAX) tablet 25 mg  25 mg Oral TID PRN Sarita Bottom, MD       LORazepam (ATIVAN) injection 0-4 mg  0-4 mg Intravenous Q6H Massengill, Harrold Donath, MD       Or   LORazepam (ATIVAN) tablet 0-4 mg  0-4 mg Oral Q6H Massengill, Harrold Donath, MD   2 mg at 04/17/23 1610   LORazepam (ATIVAN) tablet 1 mg  1 mg Oral TID Sarita Bottom, MD   1 mg at 04/17/23 1028   Followed by   Melene Muller ON 04/18/2023] LORazepam (ATIVAN) tablet 1 mg  1 mg Oral BID Abbott Pao, Mikiah Demond, MD       Followed by   Melene Muller ON 04/19/2023] LORazepam (ATIVAN) tablet 0.5 mg  0.5 mg Oral BID Abbott Pao, Jenine Krisher, MD       losartan (COZAAR) tablet 25 mg  25 mg Oral Daily Guy Toney, MD       magnesium hydroxide (MILK OF MAGNESIA) suspension 30 mL  30 mL Oral Daily PRN Massengill, Harrold Donath, MD       metFORMIN (GLUCOPHAGE) tablet 1,000 mg  1,000 mg Oral BID Massengill, Harrold Donath, MD   1,000 mg at 04/17/23 0816   multivitamin with minerals tablet 1 tablet  1 tablet Oral Daily Massengill, Harrold Donath, MD   1 tablet at 04/17/23 0816   rOPINIRole (REQUIP) tablet 1 mg  1 mg Oral QHS Massengill, Nathan, MD       thiamine (Vitamin B-1) tablet 100 mg  100 mg Oral Daily Massengill, Harrold Donath, MD   100 mg at 04/17/23 9604   Or   thiamine (VITAMIN B1) injection 100 mg  100 mg Intravenous Daily Massengill, Nathan, MD       traZODone (DESYREL) tablet 50 mg  50 mg Oral QHS PRN Abbott Pao, Joseeduardo Brix, MD        PTA Medications: Medications Prior to Admission  Medication Sig Dispense Refill Last Dose   ALPRAZolam (XANAX) 0.5 MG tablet Take 0.25-0.5 mg by mouth 3 (three) times daily as needed for anxiety. Take a half 1.2 tablet (0.25 mg) TID prn for anxiety and if needed take 1 tablet (0.5 mg) for panic attack or sleep   Past Week   aspirin EC 81 MG tablet Take 1 tablet (81 mg total) by mouth 2 (two) times daily. To prevent blood clots for 30 days after surgery. 60 tablet 0 Past Month   bisoprolol (ZEBETA) 5 MG  tablet TAKE 1 TABLET BY MOUTH DAILY 90 tablet 2 Past Month   diphenhydrAMINE (BENADRYL) 25 MG tablet Take 25 mg by mouth at bedtime as needed for sleep.   Past Month   doxazosin (CARDURA) 1 MG tablet TAKE 1 TABLET BY MOUTH AT BEDTIME (Patient taking differently: Take 1 mg by mouth daily as needed.) 90 tablet 3 Past Month   DULoxetine (CYMBALTA) 60 MG capsule Take 60 mg by mouth in the morning.   Past Week   flecainide (TAMBOCOR) 50 MG tablet TAKE ONE (1) TABLET BY MOUTH TWO (2) TIMES DAILY (Patient taking differently: Take 50 mg by  mouth 2 (two) times daily.) 180 tablet 1 Past Week   gabapentin (NEURONTIN) 300 MG capsule Take 300 mg by mouth at bedtime.   Past Week   guaiFENesin (MUCINEX) 600 MG 12 hr tablet Take 600 mg by mouth daily as needed for cough.   Past Month   losartan (COZAAR) 25 MG tablet TAKE ONE (1) TABLET BY MOUTH EACH DAY 90 tablet 1 Past Week   metFORMIN (GLUCOPHAGE) 1000 MG tablet Take 1,000 mg by mouth 2 (two) times daily.   Past Week   midodrine (PROAMATINE) 2.5 MG tablet TAKE ONE (1) TABLET BY MOUTH TWO (2) TIMES DAILY AS NEEDED 60 tablet 5 Past Month   Multiple Vitamins-Minerals (MULTIVITAMIN ADULT) CHEW Chew 1 each by mouth daily.   Past Month   mupirocin ointment (BACTROBAN) 2 % Apply 1 Application topically 2 (two) times daily. Apply to the affected area 2 times a day (Patient taking differently: Apply 1 Application topically 2 (two) times daily. Apply to the affected area 2 times a day on ankle where surgery occurred) 22 g 3 Past Week   omeprazole (PRILOSEC) 40 MG capsule Take 40 mg by mouth in the morning and at bedtime.   Past Month   oxyCODONE-acetaminophen (PERCOCET/ROXICET) 5-325 MG tablet Take 1 tablet by mouth every 4 (four) hours as needed. 30 tablet 0 Past Month   rOPINIRole (REQUIP) 1 MG tablet Take 1 mg by mouth at bedtime. For RLS   Past Week    Musculoskeletal: Strength & Muscle Tone: within normal limits Gait & Station: normal Patient leans:  N/A   Physical Findings: AIMS: Facial and Oral Movements Muscles of Facial Expression: None, normal Lips and Perioral Area: None, normal Jaw: None, normal Tongue: None, normal,Extremity Movements Upper (arms, wrists, hands, fingers): None, normal Lower (legs, knees, ankles, toes): None, normal, Trunk Movements Neck, shoulders, hips: None, normal, Overall Severity Severity of abnormal movements (highest score from questions above): None, normal Incapacitation due to abnormal movements: None, normal Patient's awareness of abnormal movements (rate only patient's report): No Awareness, Dental Status Current problems with teeth and/or dentures?: No Does patient usually wear dentures?: No  CIWA:  CIWA-Ar Total: 15 COWS:     Psychiatric Specialty Exam:  General Appearance: Dressed in hospital gown, slightly unkempt  Behavior: Cooperative, calm and pleasant  Psychomotor Activity:No psychomotor agitation or retardation noted   Eye Contact: Fair Speech: Slightly increased amount, normal tone and volume   Mood: Mildly euphoric Affect: Mildly elated  Thought Process: Linear and goal directed mildly circumstantial, no racing thoughts or flight of ideas noted Descriptions of Associations: Intact, no loose association Thought Content: Hallucinations: Denies AH, VH  Delusions: No paranoia  Suicidal Thoughts: Reports passive SI occasionally wishing self dead, denies active SI, intention, plan  Homicidal Thoughts: Denies HI, intention, plan   Alertness/Orientation: Alert and oriented  Insight: poor Judgment: poor  Memory: Intact  Executive Functions  Concentration: Fair Attention Span: Fair Recall: Intact Fund of Knowledge: Fair   Physical Exam:  Physical Exam Vitals and nursing note reviewed.  Constitutional:      Appearance: Normal appearance.  HENT:     Head: Normocephalic and atraumatic.     Nose: Nose normal.  Eyes:     Pupils: Pupils are equal, round, and  reactive to light.  Pulmonary:     Effort: Pulmonary effort is normal.  Musculoskeletal:        General: Normal range of motion.     Cervical back: Normal range of motion.  Neurological:  General: No focal deficit present.     Mental Status: She is alert and oriented to person, place, and time.    Review of Systems  All other systems reviewed and are negative.  Blood pressure (!) 130/98, pulse (!) 121, temperature 98.8 F (37.1 C), temperature source Oral, resp. rate 16, height 5\' 4"  (1.626 m), weight 75.7 kg, SpO2 94 %. Body mass index is 28.65 kg/m.   Assets  Assets:No data recorded   Treatment Plan Summary: Daily contact with patient to assess and evaluate symptoms and progress in treatment and Medication management  ASSESSMENT:  Principal Diagnosis: Severe bipolar I disorder, most recent episode depressed Diagnosis:  Principal Problem:   Severe bipolar I disorder, most recent episode depressed Active Problems:   Alcohol dependence   PLAN: Safety and Monitoring:  -- Involuntary admission to inpatient psychiatric unit for safety, stabilization and treatment  -- Daily contact with patient to assess and evaluate symptoms and progress in treatment  -- Patient's case to be discussed in multi-disciplinary team meeting  -- Observation Level : q15 minute checks  -- Vital signs:  q12 hours  -- Precautions: suicide, elopement, and assault  2. Medications:   Discontinue Cymbalta given lack of efficacy and given presentation consistent with bipolar disorder with recent mania currently depressed versus mixed episode  Start Abilify 5 mg daily for mood stabilization and depression, titrate in 2 days to 10 mg daily for mood stabilization, monitor effects and safety  Increase gabapentin home dosing from 300 mg at bedtime to 300 mg twice daily, primarily prescribed for neuropathy, will help with mood and alcohol dependence as well.  Vistaril 25 mg 3 times daily as needed for  anxiety  Started trazodone 50 mg at bedtime scheduled for sleep  Started Ativan taper for alcohol withdrawals  Continue CIWA protocol for withdrawals, monitor CIWA score  Continue Cardura 1 mg daily, home medication for dysrhythmia  Continue Cozaar 25 mg daily, home medication for hypertension  Continue Glucophage 1000 mg twice daily for diabetes, home medication  Continue Requip 1 mg at bedtime, home medication for restless leg  Continue Tambocor 50 mg twice daily, home medication for dysrhythmia  The risks/benefits/side-effects/alternatives to this medication were discussed in detail with the patient and time was given for questions. The patient consents to medication trial.    -- Metabolic profile and EKG monitoring obtained while on an atypical antipsychotic (BMI: Lipid Panel: HbgA1c: QTc:)      3. Labs Reviewed: Fingerstick blood sugars 353 and 177, CMP elevated LFT, potassium slightly low 3.4, CBC no significant abnormalities noted, UDS positive for benzodiazepine, hemoglobin A1c 5/23 8.3, EKG 4/19 QTc 431      Lab ordered: 4/22 CMP to monitor potassium and liver function, hemoglobin A1c and fasting lipid panel   4. Tobacco Use Disorder  -- Nicotine patch 21mg /24 hours ordered  -- Smoking cessation encouraged  5. Group and Therapy: -- Encouraged patient to participate in unit milieu and in scheduled group therapies   --Substance Use counseling: Patient was counseled regarding need to abstain completely from alcohol use after discharge, she agrees.  Patient reports not interested in inpatient substance use rehab for alcohol dependence not interested in IOP referral, will follow.  6. Discharge Planning:   -- Social work and case management to assist with discharge planning and identification of hospital follow-up needs prior to discharge  -- Estimated LOS: 5-7 days  -- Discharge Concerns: Need to establish a safety plan; Medication compliance and effectiveness  -- Discharge  Goals:  Return home with outpatient referrals for mental health follow-up including medication management/psychotherapy   The patient is agreeable with the medication plan, as above. We will monitor the patient's response to pharmacologic treatment, and adjust medications as necessary. Patient is encouraged to participate in group therapy while admitted to the psychiatric unit. We will address other chronic and acute stressors, which contributed to the patient's increased depression and SI, increased alcohol use, in order to reduce the risk of self-harm at discharge.   Physician Treatment Plan for Primary Diagnosis: Severe bipolar I disorder, most recent episode depressed Long Term Goal(s): Improvement in symptoms so as ready for discharge  Short Term Goals: Ability to identify changes in lifestyle to reduce recurrence of condition will improve, Ability to verbalize feelings will improve, Ability to disclose and discuss suicidal ideas, Ability to demonstrate self-control will improve, and Ability to identify and develop effective coping behaviors will improve   I certify that inpatient services furnished can reasonably be expected to improve the patient's condition.    Total Time Spent in Direct Patient Care:  I personally spent 55 minutes on the unit in direct patient care. The direct patient care time included face-to-face time with the patient, reviewing the patient's chart, communicating with other professionals, and coordinating care. Greater than 50% of this time was spent in counseling or coordinating care with the patient regarding goals of hospitalization, psycho-education, and discharge planning needs.    Beda Dula Abbott Pao, MD 4/21/202412:27 PM

## 2023-04-17 NOTE — [facility] Suicide Risk Assessment (Signed)
[facility] Admission Suicide Risk Assessment   Nursing information obtained from:    Demographic factors:  Caucasian Current Mental Status:  NA Loss Factors:  Decline in physical health Historical Factors:  Family history of mental illness or substance abuse Risk Reduction Factors:  NA  Total Time spent with patient: 1 hour Principal Problem: Severe bipolar I disorder, most recent episode depressed Diagnosis:  Principal Problem:   Severe bipolar I disorder, most recent episode depressed Active Problems:   Alcohol dependence  Subjective Data: see H&P  Continued Clinical Symptoms:  Alcohol Use Disorder Identification Test Final Score (AUDIT): 27 The "Alcohol Use Disorders Identification Test", Guidelines for Use in Primary Care, Second Edition.  World Science writer [facility]). Score between 0-7:  no or low risk or alcohol related problems. Score between 8-15:  moderate risk of alcohol related problems. Score between 16-19:  high risk of alcohol related problems. Score 20 or above:  warrants further diagnostic evaluation for alcohol dependence and treatment.   CLINICAL FACTORS:   Bipolar Disorder:   Mixed State Alcohol dependence   Musculoskeletal: Strength & Muscle Tone: within normal limits Gait & Station: normal Patient leans: N/A  Psychiatric Specialty Exam:  Presentation  General Appearance: No data recorded Eye Contact:No data recorded Speech:No data recorded Speech Volume:No data recorded Handedness:No data recorded  Mood and Affect  Mood:No data recorded Affect:No data recorded  Thought Process  Thought Processes:No data recorded Descriptions of Associations:No data recorded Orientation:No data recorded Thought Content:No data recorded History of Schizophrenia/Schizoaffective disorder:No  Duration of Psychotic Symptoms:No data recorded Hallucinations:No data recorded Ideas of Reference:No data recorded Suicidal Thoughts:No data recorded Homicidal  Thoughts:No data recorded  Sensorium  Memory:No data recorded Judgment:No data recorded Insight:No data recorded  Executive Functions  Concentration:No data recorded Attention Span:No data recorded Recall:No data recorded Fund of Knowledge:No data recorded Language:No data recorded  Psychomotor Activity  Psychomotor Activity:No data recorded  Assets  Assets:No data recorded  Sleep  Sleep:No data recorded   Physical Exam: Physical Exam ROS Blood pressure (!) 130/98, pulse (!) 121, temperature 98.8 F (37.1 C), temperature source Oral, resp. rate 16, height  (1.626 m), weight 75.7 kg, SpO2 94 %. Body mass index is 28.65 kg/m.   COGNITIVE FEATURES THAT CONTRIBUTE TO RISK:  None    SUICIDE RISK:   Moderate:  Frequent suicidal ideation with limited intensity, and duration, some specificity in terms of plans, no associated intent, good self-control, limited dysphoria/symptomatology, some risk factors present, and identifiable protective factors, including available and accessible social support.  PLAN OF CARE: see H&P  I certify that inpatient services furnished can reasonably be expected to improve the patient's condition.   Amory Zbikowski Abbott Pao, MD 04/17/2023, 11:37 AM

## 2023-04-17 NOTE — Plan of Care (Signed)
  Problem: Education: Goal: Knowledge of [facility] Education information/materials will improve Outcome: Progressing   Problem: Activity: Goal: Interest or engagement in activities will improve Outcome: Progressing Goal: Sleeping patterns will improve Outcome: Progressing   Problem: Education: Goal: Ability to state activities that reduce stress will improve Outcome: Progressing   Problem: Self-Concept: Goal: Level of anxiety will decrease Outcome: Progressing
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
## 2023-04-18 ENCOUNTER — Encounter (HOSPITAL_COMMUNITY): Payer: Self-pay

## 2023-04-18 DIAGNOSIS — F314 Bipolar disorder, current episode depressed, severe, without psychotic features: Principal | ICD-10-CM

## 2023-04-18 LAB — LIPID PANEL
Cholesterol: 197 mg/dL (ref 0–200)
HDL: 82 mg/dL (ref >40)
LDL Cholesterol: 88 mg/dL (ref 0–99)
Total CHOL/HDL Ratio: 2.4 RATIO
Triglycerides: 136 mg/dL (ref <150)
VLDL: 27 mg/dL (ref 0–40)

## 2023-04-18 LAB — GLUCOSE, CAPILLARY
Glucose-Capillary: 189 mg/dL — ABNORMAL HIGH (ref 70–99)
Glucose-Capillary: 391 mg/dL — ABNORMAL HIGH (ref 70–99)
Glucose-Capillary: 247 mg/dL — ABNORMAL HIGH (ref 70–99)
Glucose-Capillary: 317 mg/dL — ABNORMAL HIGH (ref 70–99)

## 2023-04-18 LAB — COMPREHENSIVE METABOLIC PANEL
ALT: 40 U/L (ref 0–44)
AST: 61 U/L — ABNORMAL HIGH (ref 15–41)
Albumin: 4.3 g/dL (ref 3.5–5.0)
Alkaline Phosphatase: 100 U/L (ref 38–126)
Anion gap: 12 (ref 5–15)
BUN: 9 mg/dL (ref 6–20)
CO2: 30 mmol/L (ref 22–32)
Calcium: 9.8 mg/dL (ref 8.9–10.3)
Chloride: 91 mmol/L — ABNORMAL LOW (ref 98–111)
Creatinine, Ser: 0.65 mg/dL (ref 0.44–1.00)
GFR, Estimated: >60 mL/min (ref >60)
Glucose, Bld: 210 mg/dL — ABNORMAL HIGH (ref 70–99)
Potassium: 3.4 mmol/L — ABNORMAL LOW (ref 3.5–5.1)
Sodium: 133 mmol/L — ABNORMAL LOW (ref 135–145)
Total Bilirubin: 1.3 mg/dL — ABNORMAL HIGH (ref 0.3–1.2)
Total Protein: 7.5 g/dL (ref 6.5–8.1)

## 2023-04-18 LAB — HEMOGLOBIN A1C
Hgb A1c MFr Bld: 10.7 % — ABNORMAL HIGH (ref 4.8–5.6)
Mean Plasma Glucose: 260.39 mg/dL

## 2023-04-18 MED ORDER — GLIPIZIDE 5 MG PO TABS
10.0000 mg | ORAL_TABLET | Freq: Every day | ORAL | Status: DC
  Administered 2023-04-19 – 2023-04-22 (×3): 10 mg via ORAL
  Filled 2023-04-18 (×6): qty 2

## 2023-04-18 MED ORDER — ATOMOXETINE HCL 25 MG PO CAPS
25.0000 mg | ORAL_CAPSULE | Freq: Every day | ORAL | Status: DC
  Administered 2023-04-18 – 2023-04-20 (×3): 25 mg via ORAL
  Filled 2023-04-18 (×5): qty 1

## 2023-04-18 MED ORDER — ONDANSETRON HCL 4 MG PO TABS
4.0000 mg | ORAL_TABLET | Freq: Three times a day (TID) | ORAL | Status: DC | PRN
  Administered 2023-04-19 – 2023-04-21 (×2): 4 mg via ORAL
  Filled 2023-04-18 (×2): qty 1

## 2023-04-18 NOTE — Inpatient Diabetes Management (Addendum)
Inpatient Diabetes Program Recommendations  AACE/ADA: New Consensus Statement on Inpatient Glycemic Control (2015)  Target Ranges:  Prepandial:   less than 140 mg/dL      Peak postprandial:   less than 180 mg/dL (1-2 hours)      Critically ill patients:  140 - 180 mg/dL   Lab Results  Component Value Date   GLUCAP 391 (H) 04/18/2023   HGBA1C 8.3 (H) 05/24/2022    Review of Glycemic Control  Latest Reference Range & Units 04/15/23 12:29 04/16/23 23:54 04/17/23 05:33 04/17/23 12:08 04/17/23 16:55 04/17/23 20:24 04/18/23 05:46 04/18/23 11:40  Glucose-Capillary 70 - 99 mg/dL 161 (H) 096 (H) 045 (H) 361 (H) 367 (H) 146 (H) 189 (H) 391 (H)   Diabetes history: DM  Outpatient Diabetes medications:  Metformin 1000 mg bid Current orders for Inpatient glycemic control:  Metformin 1000 mg bid Novolog 0-15 units tid with meals  Inpatient Diabetes Program Recommendations:    Note that blood sugars>goal.   Consider adding Glucotrol 10 mg daily (it appears this was a home medication) and Semglee 10 units daily while at [facility].  Needs close f/u with her PCP after d/c from [facility] for DM.    Thanks,  Beryl Meager, RN, BC-ADM Inpatient Diabetes Coordinator Pager [phone]  (8a-5p)

## 2023-04-18 NOTE — Progress Notes (Addendum)
[facility] MD Progress Note  04/18/2023 11:33 AM Brittney Cooper  MRN:  [id number]   Reason for Admission:  Brittney Cooper is a 62 y.o., female with a past psychiatric history significant for bipolar disorder, alcohol use disorder who presents to [facility] emergency room for evaluation and management of increased depression and SI, alcohol dependence.  According to outside records, the patient patient presented there reporting drinking daily recently also possibly took too 10 mg oxycodone presented tearful reporting passive SI.The patient is currently on Hospital Day 2.   Chart Review from last 24 hours:  The patient's chart was reviewed and nursing notes were reviewed. The patient's case was discussed in multidisciplinary team meeting. Per [facility] patient is compliant with medications on the unit except for refusing beta-blocker use this morning for unclear reason.  CIWA score 5 this morning.  Blood pressure and heart rate remain elevated.  Information Obtained Today During Patient Interview: The patient was seen and evaluated on the unit. On assessment today the patient reports good sleep last night, had a good day yesterday reports very minimal withdrawal or with very slight tremors upon exam.  She reports on and off craving to alcohol, discussed titrating gabapentin as noted yesterday.  She reports better mood denies depressed mood or anxiety today describes her mood as "very good" denies any symptoms consistent with mania or hypomania.  Patient denies any passive or active SI intention or plan, denies HI or AVH, when asking her today if she cares if she dies she responds "yes" she is able to identify goals of living noting wanting to live for 55 years old son as well as her daughter 4 years old and "I have a service dog also I have a lot to live for".   Denies side effect to current medication regimen including starting Abilify yesterday.  Continues to be on Ativan taper  and agrees with the plan not to be on any benzodiazepine at time of discharge.  Discussed availability of Vistaril as needed for anxiety.  She reports prior to admission was using Adderall daily, discussed with patient risk of Adderall causing worsening manic symptoms, she agrees to start a trial of Strattera for ADD while hospitalized. She reports 1 incident of nausea yesterday, will start Zofran as needed.  I did contact Brittney Cooper patient's outpatient cardiologist regarding her elevated blood pressure and heart rate asking if there is a medication recommendation, no further recommendations made at this time.  Will start clonidine as needed for elevated blood pressure otherwise recommend compliance with current medications, patient admits to noncompliance with her dysrhythmia medication prior to admission.   Sleep  Sleep: Improved, good sleep reported  Principal Problem: Severe bipolar I disorder, most recent episode depressed Diagnosis: Principal Problem:   Severe bipolar I disorder, most recent episode depressed Active Problems:   Alcohol dependence    Past Psychiatric History:  Prior Psychiatric diagnoses: Bipolar disorder type II diagnoses given 5 years ago per patient's report Past Psychiatric Hospitalizations: 2 previous hospitalization first time [facility]s, second time 6 years ago at [facility]   History of self mutilation: Denies Past suicide attempts: Denies Past history of HI, violent or aggressive behavior: Denies   Past Psychiatric medications trials: Reports tried Abilify 3 years ago but unsure regarding result of treatment, tried Depakote in the past and reported caused "blood sugar fluctuates" History of ECT/TMS: Denies   Outpatient psychiatric Follow up: Sees  Dr. 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   Allergy    Anxiety    Arthritis    Bipolar disorder (HCC) 2    Cardiac arrest 06-2015, 08-2016   x3- pt has pacemaker    Chronic pain    back   Complication of anesthesia    woke up during 2 surgical procedure hydterectomy and colonscopy   Depression    Diabetes mellitus without complication    Type 2   Esophageal mass 02/2019   has upper endo with [place] with biopsy   Fatty liver    Fracture of lateral malleolus of right fibula 06/2015   GERD (gastroesophageal reflux disease)    history ofHepatomegaly    Hyperlipidemia    Hypertension    Neurocardiogenic syncope    faints 2 x week   Neurocardiogenic syncope 09/13/2022   Neuromuscular disorder    Dystonia   OCD (obsessive compulsive disorder)    OCD (obsessive compulsive disorder)    Osteoporosis    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   Pancreatic pseudocyst    Panic attack    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, working on OB floor as a nurse   Syncope    neurocardiogenic   Third degree heart block     Past Surgical History:  Procedure Laterality Date   ABDOMINAL HYSTERECTOMY  2009   ANTERIOR AND POSTERIOR REPAIR  2009   BONE EXCISION Right 07/08/2022   Procedure: PARTIAL FIBULA BONE EXCISION WITH I&D;  Surgeon: Sheral Apley, MD;  Location: [facility];  Service: Orthopedics;  Laterality: Right;   BUNIONECTOMY Bilateral    CESAREAN SECTION     EP IMPLANTABLE DEVICE N/A 10/12/2016   Procedure: Biotronik Pacemaker Implant;  Surgeon: Will Jorja Loa, MD;  Location: [facility] INVASIVE CV LAB;  Service: Cardiovascular;  Laterality: N/A;   finfger surgery Left    left middle fingger   FINGER SURGERY     HARDWARE REMOVAL Right 05/23/2022   Procedure: HARDWARE REMOVAL;  Surgeon: Teryl Lucy, MD;   Location: [facility] OR;  Service: Orthopedics;  Laterality: Right;   I & D EXTREMITY Right 07/30/2022   Procedure: PARTIAL EXCISION RIGHT FIBULA;  Surgeon: Nadara Mustard, MD;  Location: [facility] OR;  Service: Orthopedics;  Laterality: Right;   INCISION AND DRAINAGE ABSCESS Right 05/23/2022   Procedure: INCISION AND DRAINAGE ABSCESS, RIGHT ANKLE;  Surgeon: Teryl Lucy, MD;  Location: [facility] OR;  Service: Orthopedics;  Laterality: Right;   LTCS     MYRINGOTOMY  1966   ORIF ANKLE FRACTURE Right 07/25/2015   Procedure: OPEN REDUCTION INTERNAL FIXATION (ORIF) RIGHT ANKLE ;  Surgeon: Sheral Apley, MD;  Location: [facility];  Service: Orthopedics;  Laterality: Right;   RHINOPLASTY     age 83   sub mandibular cyst removal from right side of tongue  2019   TONSILLECTOMY  1966   and adenoids removed   TUBAL LIGATION  2005   Family History:  Family History  Problem Relation Age of Onset   Stroke Mother    Kidney disease Mother    Colon polyps Mother    Hypertension Mother  Kidney failure Mother    Lung cancer Father        stage IV    Colon polyps Father    Hypertension Father    Stomach cancer Maternal Grandfather    Colon cancer Paternal Grandfather    Hypertension Sister    Hyperlipidemia Sister    Hypertension Brother    Hyperlipidemia Brother    Rectal cancer Neg Hx    Esophageal cancer Neg Hx    Pancreatic cancer Neg Hx    Family Psychiatric  History:  Psychiatric illness: Father and sister have depression Suicide: Denies Substance Abuse: Father and son have alcohol-related problems Social History:  Living situation: Lives alone in [place] in a house Social support: Limited Marital Status: Was married 3 times, divorced for 14 years Children: 5 children 3 sons and 2 daughters age 63-40 Education: Patient reports she has degree in nursing Employment: Unemployed on disability 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 Advice worker service: Denies Legal history:  Denies pending charges or court dates Trauma: Denies Access to guns: Denies  Current Medications: Current Facility-Administered Medications  Medication Dose Route Frequency Provider Last Rate Last Admin   alum & mag hydroxide-simeth (MAALOX/MYLANTA) 200-200-20 MG/5ML suspension 30 mL  30 mL Oral Q4H PRN Massengill, Harrold Donath, MD       Melene Muller ON 04/19/2023] ARIPiprazole (ABILIFY) tablet 10 mg  10 mg Oral Daily Nehemie Casserly, MD       bisoprolol (ZEBETA) tablet 5 mg  5 mg Oral Daily Shizue Kaseman, MD       diphenhydrAMINE (BENADRYL) capsule 25 mg  25 mg Oral QHS PRN Massengill, Harrold Donath, MD   25 mg at 04/17/23 0102   doxazosin (CARDURA) tablet 1 mg  1 mg Oral Daily Massengill, Nathan, MD   1 mg at 04/17/23 2107   flecainide (TAMBOCOR) tablet 50 mg  50 mg Oral BID Massengill, Harrold Donath, MD   50 mg at 04/18/23 0816   folic acid (FOLVITE) tablet 1 mg  1 mg Oral Daily Massengill, Nathan, MD   1 mg at 04/18/23 0816   gabapentin (NEURONTIN) capsule 300 mg  300 mg Oral BID Abbott Cooper, Amarys Sliwinski, MD   300 mg at 04/18/23 0816   hydrOXYzine (ATARAX) tablet 25 mg  25 mg Oral TID PRN Abbott Cooper, Dezaray Shibuya, MD   25 mg at 04/18/23 0659   insulin aspart (novoLOG) injection 0-15 Units  0-15 Units Subcutaneous TID WC Abbott Cooper, Jalicia Roszak, MD   3 Units at 04/18/23 0643   LORazepam (ATIVAN) tablet 1 mg  1 mg Oral BID Abbott Cooper, Tonnie Friedel, MD   1 mg at 04/18/23 4098   Followed by   Melene Muller ON 04/19/2023] LORazepam (ATIVAN) tablet 0.5 mg  0.5 mg Oral BID Abbott Cooper, Bryor Rami, MD       losartan (COZAAR) tablet 25 mg  25 mg Oral Daily Wayden Schwertner, MD   25 mg at 04/18/23 0910   magnesium hydroxide (MILK OF MAGNESIA) suspension 30 mL  30 mL Oral Daily PRN Massengill, Harrold Donath, MD       metFORMIN (GLUCOPHAGE) tablet 1,000 mg  1,000 mg Oral BID Massengill, Harrold Donath, MD   1,000 mg at 04/18/23 0816   multivitamin with minerals tablet 1 tablet  1 tablet Oral Daily Massengill, Nathan, MD   1 tablet at 04/18/23 0816   nicotine (NICODERM CQ - dosed in mg/24 hours) patch 21 mg   21 mg Transdermal Daily Laketha Leopard, MD   21 mg at 04/18/23 0850   rOPINIRole (REQUIP) tablet 1 mg  1 mg Oral QHS Massengill, Nathan, MD   1 mg at 04/17/23 2107   thiamine (Vitamin B-1) tablet 100 mg  100 mg Oral Daily Massengill, Nathan, MD   100 mg at 04/18/23 1610   Or   thiamine (VITAMIN B1) injection 100 mg  100 mg Intravenous Daily Massengill, Nathan, MD       traZODone (DESYREL) tablet 50 mg  50 mg Oral QHS PRN Sarita Bottom, MD   50 mg at 04/17/23 2107    Lab Results:  Results for orders placed or performed during the hospital encounter of 04/16/23 (from the past 48 hour(s))  Glucose, capillary     Status: Abnormal   Collection Time: 04/16/23 11:54 PM  Result Value Ref Range   Glucose-Capillary 353 (H) 70 - 99 mg/dL    Comment: Glucose reference range applies only to samples taken after fasting for at least 8 hours.  Glucose, capillary     Status: Abnormal   Collection Time: 04/17/23  5:33 AM  Result Value Ref Range   Glucose-Capillary 177 (H) 70 - 99 mg/dL    Comment: Glucose reference range applies only to samples taken after fasting for at least 8 hours.  Glucose, capillary     Status: Abnormal   Collection Time: 04/17/23 12:08 PM  Result Value Ref Range   Glucose-Capillary 361 (H) 70 - 99 mg/dL    Comment: Glucose reference range applies only to samples taken after fasting for at least 8 hours.  Glucose, capillary     Status: Abnormal   Collection Time: 04/17/23  4:55 PM  Result Value Ref Range   Glucose-Capillary 367 (H) 70 - 99 mg/dL    Comment: Glucose reference range applies only to samples taken after fasting for at least 8 hours.  Glucose, capillary     Status: Abnormal   Collection Time: 04/17/23  8:24 PM  Result Value Ref Range   Glucose-Capillary 146 (H) 70 - 99 mg/dL    Comment: Glucose reference range applies only to samples taken after fasting for at least 8 hours.   Comment 1 Notify RN    Comment 2 Document in Chart   Glucose, capillary     Status:  Abnormal   Collection Time: 04/18/23  5:46 AM  Result Value Ref Range   Glucose-Capillary 189 (H) 70 - 99 mg/dL    Comment: Glucose reference range applies only to samples taken after fasting for at least 8 hours.   Comment 1 Notify RN    Comment 2 Document in Chart   Comprehensive metabolic panel     Status: Abnormal   Collection Time: 04/18/23  6:33 AM  Result Value Ref Range   Sodium 133 (L) 135 - 145 mmol/L   Potassium 3.4 (L) 3.5 - 5.1 mmol/L   Chloride 91 (L) 98 - 111 mmol/L   CO2 30 22 - 32 mmol/L   Glucose, Bld 210 (H) 70 - 99 mg/dL    Comment: Glucose reference range applies only to samples taken after fasting for at least 8 hours.   BUN 9 6 - 20 mg/dL   Creatinine, Ser 9.60 0.44 - 1.00 mg/dL   Calcium 9.8 8.9 - 45.4 mg/dL   Total Protein 7.5 6.5 - 8.1 g/dL   Albumin 4.3 3.5 - 5.0 g/dL   AST 61 (H) 15 - 41 U/L   ALT 40 0 - 44 U/L   Alkaline Phosphatase 100 38 - 126 U/L   Total Bilirubin 1.3 (H) 0.3 - 1.2 mg/dL  GFR, Estimated >60 >60 mL/min    Comment: (NOTE) Calculated using the CKD-EPI Creatinine Equation (2021)    Anion gap 12 5 - 15    Comment: Performed at [facility], [address], [place], [place] [address]  Lipid panel     Status: None   Collection Time: 04/18/23  6:33 AM  Result Value Ref Range   Cholesterol 197 0 - 200 mg/dL   Triglycerides 914 <782 mg/dL   HDL 82 >95 mg/dL   Total CHOL/HDL Ratio 2.4 RATIO   VLDL 27 0 - 40 mg/dL   LDL Cholesterol 88 0 - 99 mg/dL    Comment:        Total Cholesterol/HDL:CHD Risk Coronary Heart Disease Risk Table                     Men   Women  1/2 Average Risk   3.4   3.3  Average Risk       5.0   4.4  2 X Average Risk   9.6   7.1  3 X Average Risk  23.4   11.0        Use the calculated Patient Ratio above and the CHD Risk Table to determine the patient's CHD Risk.        ATP III CLASSIFICATION (LDL):  <100     mg/dL   Optimal  621-308  mg/dL   Near or Above                    Optimal   130-159  mg/dL   Borderline  657-846  mg/dL   High  >962     mg/dL   Very High Performed at [facility], [address], [place], [place] [address]     Blood Alcohol level:  Lab Results  Component Value Date   ETH 215 (H) 04/15/2023   ETH 112 (H) 02/03/2017    Metabolic Disorder Labs: Lab Results  Component Value Date   HGBA1C 8.3 (H) 05/24/2022   MPG 191.51 05/24/2022   MPG 211.6 11/30/2020   No results found for: "PROLACTIN" Lab Results  Component Value Date   CHOL 197 04/18/2023   TRIG 136 04/18/2023   HDL 82 04/18/2023   CHOLHDL 2.4 04/18/2023   VLDL 27 04/18/2023   LDLCALC 88 04/18/2023   LDLCALC 138 (H) 11/30/2020    Physical Findings: AIMS: Facial and Oral Movements Muscles of Facial Expression: None, normal Lips and Perioral Area: None, normal Jaw: None, normal Tongue: None, normal,Extremity Movements Upper (arms, wrists, hands, fingers): None, normal Lower (legs, knees, ankles, toes): None, normal, Trunk Movements Neck, shoulders, hips: None, normal, Overall Severity Severity of abnormal movements (highest score from questions above): None, normal Incapacitation due to abnormal movements: None, normal Patient's awareness of abnormal movements (rate only patient's report): No Awareness, Dental Status Current problems with teeth and/or dentures?: No Does patient usually wear dentures?: No  CIWA:  CIWA-Ar Total: 5 COWS:     Musculoskeletal: Strength & Muscle Tone: within normal limits Gait & Station: normal Patient leans: N/A  Psychiatric Specialty Exam:  General Appearance: Dressed casually, fairly groomed, appears at stated age.   Behavior: Cooperative, calm and pleasant   Psychomotor Activity:No psychomotor agitation or retardation noted    Eye Contact: Fair Speech: Within normal limits     Mood: Euthymic Affect: Pleasant, congruent   Thought Process: Linear and goal   Descriptions of Associations: Intact, no loose  association Thought Content: Hallucinations: Denies AH, VH  Delusions: No paranoia  Suicidal Thoughts: Reports passive SI occasionally wishing self dead, denies active SI, intention, plan  Homicidal Thoughts: Denies HI, intention, plan    Alertness/Orientation: Alert and oriented   Insight: Improved Judgment: Improved   Memory: Intact   Executive Functions  Concentration: Fair Attention Span: Fair Recall: Intact Fund of Knowledge: Fair  Assets  Assets:No data recorded   Physical Exam: Physical Exam Vitals and nursing note reviewed.    Review of Systems  All other systems reviewed and are negative.  Blood pressure 118/84, pulse (!) 113, temperature 97.9 F (36.6 C), temperature source Oral, resp. rate 20, height 5\' 4"  (1.626 m), weight 75.7 kg, SpO2 94 %. Body mass index is 28.65 kg/m.   Treatment Plan Summary: Daily contact with patient to assess and evaluate symptoms and progress in treatment and Medication management  ASSESSMENT:  Diagnoses / Active Problems: Principal Problem: Severe bipolar I disorder, most recent episode depressed Diagnosis: Principal Problem:   Severe bipolar I disorder, most recent episode depressed Active Problems:   Alcohol dependence   PLAN: Safety and Monitoring:  -- Involuntary admission to inpatient psychiatric unit for safety, stabilization and treatment  -- Daily contact with patient to assess and evaluate symptoms and progress in treatment  -- Patient's case to be discussed in multi-disciplinary team meeting  -- Observation Level : q15 minute checks  -- Vital signs:  q12 hours  -- Precautions: suicide, elopement, and assault  2. Medications:   Cymbalta was discontinued on 4/21             Continue Abilify being titrated today to 10 mg daily for mood stabilization and depression              Continue gabapentin 300 mg twice daily, primarily prescribed for neuropathy, will help with mood and alcohol dependence as well.              Vistaril 25 mg 3 times daily as needed for anxiety             Continue trazodone 50 mg at bedtime scheduled for sleep             Continue Ativan taper for alcohol withdrawals             Continue CIWA protocol for withdrawals, monitor CIWA score             Continue Cardura 1 mg daily, home medication for dysrhythmia             Continue Cozaar 25 mg daily, home medication for hypertension             Continue Glucophage 1000 mg twice daily for diabetes, home medication             Continue Requip 1 mg at bedtime, home medication for restless leg             Continue Tambocor 50 mg twice daily, home medication for dysrhythmia  Continue bisoprolol 5 mg daily for dysrhythmia, home medication  Start Zofran as needed for nausea  Start Strattera daily for ADD  Started Glucotrol 10 mg daily for better management of diabetes as recommended by diabetes care management consult.   Patient was recommended to comply with psychiatric and medical medications after discharge and to comply with outpatient follow-up with all her providers for mental and physical ailments including diabetes, dysrhythmia and hypertension, she agreed.  The risks/benefits/side-effects/alternatives to this medication were discussed  in detail with the patient and time was given for questions. The patient consents to medication trial.    -- Metabolic profile and EKG monitoring obtained while on an atypical antipsychotic (BMI: Lipid Panel: HbgA1c: QTc:)      3. Pertinent labs: Fingerstick blood sugars 353 and 177, CMP elevated LFT, potassium slightly low 3.4, CBC no significant abnormalities noted, UDS positive for benzodiazepine, hemoglobin A1c 5/23 8.3, EKG 4/19 QTc 431  CMP 4/22 LFT improved slightly elevated AST 61, ALT within normal level, potassium very slightly low at 3.4 no significant abnormalities noted otherwise, fasting lipid panel within normal level, hemoglobin A1c pending     Lab ordered: Hemoglobin A1c   4.  Tobacco Use Disorder  -- Nicotine patch 21mg /24 hours ordered  -- Smoking cessation encouraged  5. Group and Therapy: -- Encouraged patient to participate in unit milieu and in scheduled group therapies     Today patient refusing referral for inpatient rehab treatment, reports interest in IOP referral, will follow.  -- Short Term Goals: Ability to identify changes in lifestyle to reduce recurrence of condition will improve, Ability to verbalize feelings will improve, Ability to disclose and discuss suicidal ideas, and Ability to demonstrate self-control will improve  -- Long Term Goals: Improvement in symptoms so as ready for discharge  6. Discharge Planning:   -- Social work and case management to assist with discharge planning and identification of hospital follow-up needs prior to discharge  -- Estimated LOS: 5-7 days  -- Discharge Concerns: Need to establish a safety plan; Medication compliance and effectiveness  -- Discharge Goals: Return home with outpatient referrals for mental health follow-up including medication management/psychotherapy      Total Time Spent in Direct Patient Care:  I personally spent 35 minutes on the unit in direct patient care. The direct patient care time included face-to-face time with the patient, reviewing the patient's chart, communicating with other professionals, and coordinating care. Greater than 50% of this time was spent in counseling or coordinating care with the patient regarding goals of hospitalization, psycho-education, and discharge planning needs.   Brittney Cutsforth Abbott Pao, MD 04/18/2023, 11:33 AM

## 2023-04-18 NOTE — BH IP Treatment Plan (Signed)
Interdisciplinary Treatment and Diagnostic Plan Update  04/18/2023 Time of Session: 10:50am Brittney Cooper MRN: [id number]  Principal Diagnosis: Severe bipolar I disorder, most recent episode depressed  Secondary Diagnoses: Principal Problem:   Severe bipolar I disorder, most recent episode depressed Active Problems:   Alcohol dependence   Current Medications:  Current Facility-Administered Medications  Medication Dose Route Frequency Provider Last Rate Last Admin   alum & mag hydroxide-simeth (MAALOX/MYLANTA) 200-200-20 MG/5ML suspension 30 mL  30 mL Oral Q4H PRN Massengill, Harrold Donath, MD       Melene Muller ON 04/19/2023] ARIPiprazole (ABILIFY) tablet 10 mg  10 mg Oral Daily Attiah, Nadir, MD       atomoxetine (STRATTERA) capsule 25 mg  25 mg Oral Daily Attiah, Nadir, MD   25 mg at 04/18/23 1402   bisoprolol (ZEBETA) tablet 5 mg  5 mg Oral Daily Attiah, Nadir, MD       diphenhydrAMINE (BENADRYL) capsule 25 mg  25 mg Oral QHS PRN Massengill, Harrold Donath, MD   25 mg at 04/17/23 0102   doxazosin (CARDURA) tablet 1 mg  1 mg Oral Daily Massengill, Nathan, MD   1 mg at 04/17/23 2107   flecainide (TAMBOCOR) tablet 50 mg  50 mg Oral BID Massengill, Harrold Donath, MD   50 mg at 04/18/23 4098   folic acid (FOLVITE) tablet 1 mg  1 mg Oral Daily Massengill, Nathan, MD   1 mg at 04/18/23 0816   gabapentin (NEURONTIN) capsule 300 mg  300 mg Oral BID Abbott Pao, Nadir, MD   300 mg at 04/18/23 0816   [START ON 04/19/2023] glipiZIDE (GLUCOTROL) tablet 10 mg  10 mg Oral QAC breakfast Abbott Pao, Nadir, MD       hydrOXYzine (ATARAX) tablet 25 mg  25 mg Oral TID PRN Abbott Pao, Nadir, MD   25 mg at 04/18/23 1402   insulin aspart (novoLOG) injection 0-15 Units  0-15 Units Subcutaneous TID WC Attiah, Nadir, MD   15 Units at 04/18/23 1158   LORazepam (ATIVAN) tablet 1 mg  1 mg Oral BID Attiah, Nadir, MD   1 mg at 04/18/23 1191   Followed by   Melene Muller ON 04/19/2023] LORazepam (ATIVAN) tablet 0.5 mg  0.5 mg Oral BID Abbott Pao, Nadir, MD        losartan (COZAAR) tablet 25 mg  25 mg Oral Daily Attiah, Nadir, MD   25 mg at 04/18/23 0910   magnesium hydroxide (MILK OF MAGNESIA) suspension 30 mL  30 mL Oral Daily PRN Massengill, Harrold Donath, MD       metFORMIN (GLUCOPHAGE) tablet 1,000 mg  1,000 mg Oral BID Massengill, Harrold Donath, MD   1,000 mg at 04/18/23 0816   multivitamin with minerals tablet 1 tablet  1 tablet Oral Daily Massengill, Nathan, MD   1 tablet at 04/18/23 0816   nicotine (NICODERM CQ - dosed in mg/24 hours) patch 21 mg  21 mg Transdermal Daily Attiah, Nadir, MD   21 mg at 04/18/23 0850   ondansetron (ZOFRAN) tablet 4 mg  4 mg Oral Q8H PRN Abbott Pao, Nadir, MD       rOPINIRole (REQUIP) tablet 1 mg  1 mg Oral QHS Massengill, Nathan, MD   1 mg at 04/17/23 2107   thiamine (Vitamin B-1) tablet 100 mg  100 mg Oral Daily Massengill, Harrold Donath, MD   100 mg at 04/18/23 4782   Or   thiamine (VITAMIN B1) injection 100 mg  100 mg Intravenous Daily Massengill, Nathan, MD       traZODone (DESYREL) tablet 50 mg  50 mg Oral QHS PRN Sarita Bottom, MD   50 mg at 04/17/23 2107   PTA Medications: Medications Prior to Admission  Medication Sig Dispense Refill Last Dose   ALPRAZolam (XANAX) 0.5 MG tablet Take 0.25-0.5 mg by mouth 3 (three) times daily as needed for anxiety. Take a half 1.2 tablet (0.25 mg) TID prn for anxiety and if needed take 1 tablet (0.5 mg) for panic attack or sleep   Past Week   aspirin EC 81 MG tablet Take 1 tablet (81 mg total) by mouth 2 (two) times daily. To prevent blood clots for 30 days after surgery. 60 tablet 0 Past Month   bisoprolol (ZEBETA) 5 MG tablet TAKE 1 TABLET BY MOUTH DAILY 90 tablet 2 Past Month   diphenhydrAMINE (BENADRYL) 25 MG tablet Take 25 mg by mouth at bedtime as needed for sleep.   Past Month   doxazosin (CARDURA) 1 MG tablet TAKE 1 TABLET BY MOUTH AT BEDTIME (Patient taking differently: Take 1 mg by mouth daily as needed.) 90 tablet 3 Past Month   DULoxetine (CYMBALTA) 60 MG capsule Take 60 mg by mouth in the  morning.   Past Week   flecainide (TAMBOCOR) 50 MG tablet TAKE ONE (1) TABLET BY MOUTH TWO (2) TIMES DAILY (Patient taking differently: Take 50 mg by mouth 2 (two) times daily.) 180 tablet 1 Past Week   gabapentin (NEURONTIN) 300 MG capsule Take 300 mg by mouth at bedtime.   Past Week   guaiFENesin (MUCINEX) 600 MG 12 hr tablet Take 600 mg by mouth daily as needed for cough.   Past Month   losartan (COZAAR) 25 MG tablet TAKE ONE (1) TABLET BY MOUTH EACH DAY 90 tablet 1 Past Week   metFORMIN (GLUCOPHAGE) 1000 MG tablet Take 1,000 mg by mouth 2 (two) times daily.   Past Week   midodrine (PROAMATINE) 2.5 MG tablet TAKE ONE (1) TABLET BY MOUTH TWO (2) TIMES DAILY AS NEEDED 60 tablet 5 Past Month   Multiple Vitamins-Minerals (MULTIVITAMIN ADULT) CHEW Chew 1 each by mouth daily.   Past Month   mupirocin ointment (BACTROBAN) 2 % Apply 1 Application topically 2 (two) times daily. Apply to the affected area 2 times a day (Patient taking differently: Apply 1 Application topically 2 (two) times daily. Apply to the affected area 2 times a day on ankle where surgery occurred) 22 g 3 Past Week   omeprazole (PRILOSEC) 40 MG capsule Take 40 mg by mouth in the morning and at bedtime.   Past Month   oxyCODONE-acetaminophen (PERCOCET/ROXICET) 5-325 MG tablet Take 1 tablet by mouth every 4 (four) hours as needed. 30 tablet 0 Past Month   rOPINIRole (REQUIP) 1 MG tablet Take 1 mg by mouth at bedtime. For RLS   Past Week    Patient Stressors: Financial difficulties   Health problems   Medication change or noncompliance    Patient Strengths: Ability for insight  Active sense of humor  General fund of knowledge  Motivation for treatment/growth   Treatment Modalities: Medication Management, Group therapy, Case management,  1 to 1 session with clinician, Psychoeducation, Recreational therapy.   Physician Treatment Plan for Primary Diagnosis: Severe bipolar I disorder, most recent episode depressed Long Term  Goal(s): Improvement in symptoms so as ready for discharge   Short Term Goals: Ability to identify changes in lifestyle to reduce recurrence of condition will improve Ability to verbalize feelings will improve Ability to disclose and discuss suicidal ideas Ability to demonstrate self-control will improve  Medication Management: Evaluate patient's response, side effects, and tolerance of medication regimen.  Therapeutic Interventions: 1 to 1 sessions, Unit Group sessions and Medication administration.  Evaluation of Outcomes: Progressing  Physician Treatment Plan for Secondary Diagnosis: Principal Problem:   Severe bipolar I disorder, most recent episode depressed Active Problems:   Alcohol dependence  Long Term Goal(s): Improvement in symptoms so as ready for discharge   Short Term Goals: Ability to identify changes in lifestyle to reduce recurrence of condition will improve Ability to verbalize feelings will improve Ability to disclose and discuss suicidal ideas Ability to demonstrate self-control will improve     Medication Management: Evaluate patient's response, side effects, and tolerance of medication regimen.  Therapeutic Interventions: 1 to 1 sessions, Unit Group sessions and Medication administration.  Evaluation of Outcomes: Progressing   RN Treatment Plan for Primary Diagnosis: Severe bipolar I disorder, most recent episode depressed Long Term Goal(s): Knowledge of disease and therapeutic regimen to maintain health will improve  Short Term Goals: Ability to remain free from injury will improve, Ability to verbalize frustration and anger appropriately will improve, Ability to demonstrate self-control, Ability to participate in decision making will improve, Ability to verbalize feelings will improve, Ability to disclose and discuss suicidal ideas, Ability to identify and develop effective coping behaviors will improve, and Compliance with prescribed medications will  improve  Medication Management: RN will administer medications as ordered by provider, will assess and evaluate patient's response and provide education to patient for prescribed medication. RN will report any adverse and/or side effects to prescribing provider.  Therapeutic Interventions: 1 on 1 counseling sessions, Psychoeducation, Medication administration, Evaluate responses to treatment, Monitor vital signs and CBGs as ordered, Perform/monitor CIWA, COWS, AIMS and Fall Risk screenings as ordered, Perform wound care treatments as ordered.  Evaluation of Outcomes: Progressing   LCSW Treatment Plan for Primary Diagnosis: Severe bipolar I disorder, most recent episode depressed Long Term Goal(s): Safe transition to appropriate next level of care at discharge, Engage patient in therapeutic group addressing interpersonal concerns.  Short Term Goals: Engage patient in aftercare planning with referrals and resources, Increase social support, Increase ability to appropriately verbalize feelings, Increase emotional regulation, Facilitate acceptance of mental health diagnosis and concerns, Facilitate patient progression through stages of change regarding substance use diagnoses and concerns, Identify triggers associated with mental health/substance abuse issues, and Increase skills for wellness and recovery  Therapeutic Interventions: Assess for all discharge needs, 1 to 1 time with Social worker, Explore available resources and support systems, Assess for adequacy in community support network, Educate family and significant other(s) on suicide prevention, Complete Psychosocial Assessment, Interpersonal group therapy.  Evaluation of Outcomes: Progressing   Progress in Treatment: Attending groups: Yes. Participating in groups: Yes. Taking medication as prescribed: Yes. Toleration medication: Yes. Family/Significant other contact made: No, will contact:  declined consents Patient understands  diagnosis: Yes. Discussing patient identified problems/goals with staff: Yes. Medical problems stabilized or resolved: Yes. Denies suicidal/homicidal ideation: Yes. Issues/concerns per patient self-inventory: No.   New problem(s) identified: No, Describe:  none reported   New Short Term/Long Term Goal(s):  medication stabilization, elimination of SI thoughts, development of comprehensive mental wellness plan.    Patient Goals:  Patient states, "I want to make medication adjustments"  Discharge Plan or Barriers: Patient recently admitted. CSW will continue to follow and assess for appropriate referrals and possible discharge planning.     Reason for Continuation of Hospitalization: Anxiety Depression Medical Issues Medication stabilization Suicidal ideation  Estimated Length of  Stay: 3-5 days  Last 3 [place] Suicide Severity Risk Score: Flowsheet Row Ad[facility] (Current) [facility] from 04/15/2023 in [facility] at [facility] from [facility]/12/2023 in [facility] at [facility]  C-SSRS RISK CATEGORY No Risk No Risk No Risk       Last PHQ 2/9 Scores:    07/22/2022    2:55 PM 07/22/2022    2:41 PM  Depression screen PHQ 2/9  Decreased Interest 0 0  Down, Depressed, Hopeless 0 0  PHQ - 2 Score 0 0    Scribe for Treatment Team: Beatris Si, LCSW 04/18/2023 3:14 PM

## 2023-04-18 NOTE — Progress Notes (Signed)
   04/18/23 0626  15 Minute Checks  Location Dayroom  Visual Appearance Calm  Behavior Composed  Sleep (Behavioral Health Patients Only)  Calculate sleep? (Click Yes once per 24 hr at 0600 safety check) Yes  Documented sleep last 24 hours 5.5

## 2023-04-18 NOTE — Progress Notes (Signed)
   04/17/23 2130  Psych Admission Type (Psych Patients Only)  Admission Status Voluntary  Psychosocial Assessment  Patient Complaints Agitation;Anxiety;Sleep disturbance  Eye Contact Fair  Facial Expression Animated  Affect Anxious  Speech Logical/coherent  Interaction Assertive  Motor Activity Slow  Appearance/Hygiene Improved  Behavior Characteristics Cooperative;Appropriate to situation  Mood Anxious;Pleasant  Aggressive Behavior  Effect No apparent injury  Thought Process  Coherency WDL  Content WDL  Delusions None reported or observed  Perception WDL  Hallucination None reported or observed  Judgment Impaired  Confusion None  Danger to Self  Current suicidal ideation? Denies  Agreement Not to Harm Self Yes  Description of Agreement Verbal contract  Danger to Others  Danger to Others None reported or observed

## 2023-04-18 NOTE — Progress Notes (Signed)
   04/18/23 0900  Psych Admission Type (Psych Patients Only)  Admission Status Voluntary  Psychosocial Assessment  Patient Complaints Anxiety;Agitation  Eye Contact Fair  Facial Expression Animated  Affect Anxious  Speech Logical/coherent  Interaction Assertive  Motor Activity Slow  Appearance/Hygiene Improved  Behavior Characteristics Cooperative;Appropriate to situation  Mood Anxious;Pleasant  Thought Process  Coherency WDL  Content WDL  Delusions None reported or observed  Perception WDL  Hallucination None reported or observed  Judgment Impaired  Confusion None  Danger to Self  Current suicidal ideation? Denies  Agreement Not to Harm Self Yes  Description of Agreement verbal  Danger to Others  Danger to Others None reported or observed

## 2023-04-18 NOTE — BHH Group Notes (Signed)
Pt attended AA group 

## 2023-04-18 NOTE — Group Note (Signed)
Occupational Therapy Group Note  Group Topic: Sleep Hygiene  Group Date: 04/18/2023 Start Time: 1430 End Time: 1500 Facilitators: Vaishali Baise G, OT   Group Description: Group encouraged increased participation and engagement through topic focused on sleep hygiene. Patients reflected on the quality of sleep they typically receive and identified areas that need improvement. Group was given background information on sleep and sleep hygiene, including common sleep disorders. Group members also received information on how to improve one's sleep and introduced a sleep diary as a tool that can be utilized to track sleep quality over a length of time. Group session ended with patients identifying one or more strategies they could utilize or implement into their sleep routine in order to improve overall sleep quality.        Therapeutic Goal(s):  Identify one or more strategies to improve overall sleep hygiene  Identify one or more areas of sleep that are negatively impacted (sleep too much, too little, etc)     Participation Level: Engaged   Participation Quality: Independent   Behavior: Appropriate   Speech/Thought Process: Relevant   Affect/Mood: Appropriate   Insight: Fair   Judgement: Fair      Modes of Intervention: Education  Patient Response to Interventions:  Attentive   Plan: Continue to engage patient in OT groups 2 - 3x/week.  04/18/2023  Maher Shon G Alise Calais, OT   Ariadne Rissmiller, OT  

## 2023-04-18 NOTE — Group Note (Signed)
Recreation Therapy Group Note   Group Topic:Team Building  Group Date: 04/18/2023 Start Time: 7425 End Time: 1016 Facilitators: Nathin Saran-McCall, LRT,CTRS Location: [address] Dayroom   Goal Area(s) Addresses:  Patient will effectively work with peer towards shared goal.  Patient will identify skills used to make activity successful.  Patient will identify how skills used during activity can be used to reach post d/c goals.    Group Description: Landing Pad. In teams of 3-5, patients were given 12 plastic drinking straws and an equal length of masking tape. Using the materials provided, patients were asked to build a landing pad to catch a golf ball dropped from approximately 5 feet in the air. All materials were required to be used by the team in their design. LRT facilitated post-activity discussion.   Affect/Mood: N/A   Participation Level: Did not attend    Clinical Observations/Individualized Feedback:    Plan: Continue to engage patient in RT group sessions 2-3x/week.   Tong Pieczynski-McCall, LRT,CTRS  04/18/2023 12:46 PM

## 2023-04-19 LAB — GLUCOSE, CAPILLARY
Glucose-Capillary: 224 mg/dL — ABNORMAL HIGH (ref 70–99)
Glucose-Capillary: 268 mg/dL — ABNORMAL HIGH (ref 70–99)
Glucose-Capillary: 175 mg/dL — ABNORMAL HIGH (ref 70–99)
Glucose-Capillary: 185 mg/dL — ABNORMAL HIGH (ref 70–99)

## 2023-04-19 NOTE — Progress Notes (Addendum)
  Patient presents with crying spells, stated that she is worried about her dogs, and how she will deal with her "stressors" at home when she is discharged from the hospital. She stated that returning to drinking alcohol again is her major problem. Patient encouraged to talk with the social worker about resources  available for her case. Patient complaint with her bedtime medications, denies SI.HI, & AVH. Patient remains safe on the unit, Q 15 minutes safety checks ongoing.    04/19/23 0100  Psych Admission Type (Psych Patients Only)  Admission Status Voluntary  Psychosocial Assessment  Patient Complaints Anxiety;Depression  Eye Contact Fair  Facial Expression Animated  Affect Anxious  Speech Logical/coherent  Interaction Assertive  Motor Activity Slow  Appearance/Hygiene Unremarkable  Behavior Characteristics Cooperative;Appropriate to situation  Mood Anxious;Depressed  Aggressive Behavior  Effect No apparent injury  Thought Process  Coherency WDL  Content WDL  Delusions None reported or observed  Perception WDL  Hallucination None reported or observed  Judgment Impaired  Confusion None  Danger to Self  Current suicidal ideation? Denies  Agreement Not to Harm Self Yes  Description of Agreement Verbal  Danger to Others  Danger to Others None reported or observed

## 2023-04-19 NOTE — Group Note (Signed)
Date:  04/19/2023 Time:  8:49 AM  Group Topic/Focus:  Goals Group:   The focus of this group is to help patients establish daily goals to achieve during treatment and discuss how the patient can incorporate goal setting into their daily lives to aide in recovery. Orientation:   The focus of this group is to educate the patient on the purpose and policies of crisis stabilization and provide a format to answer questions about their admission.  The group details unit policies and expectations of patients while admitted.    Participation Level:  Active  Participation Quality:  Attentive  Affect:  Appropriate  Cognitive:  Appropriate  Insight: Appropriate  Engagement in Group:  Engaged  Modes of Intervention:  Discussion  Additional Comments:  Patient attended goals group and was attentive the duration of it.   Skipper Dacosta T Lorraine Lax 04/19/2023, 8:49 AM

## 2023-04-19 NOTE — Group Note (Signed)
LCSW Group Therapy Note  Group Date: 04/19/2023 Start Time: 1100 End Time: 1155   Type of Therapy and Topic:  Group Therapy - How To Cope with Nervousness about Discharge   Participation Level:  Active   Description of Group This process group involved identification of patients' feelings about discharge. Some of them are scheduled to be discharged soon, while others are new admissions, but each of them was asked to share thoughts and feelings surrounding discharge from the hospital. One common theme was that they are excited at the prospect of going home, while another was that many of them are apprehensive about sharing why they were hospitalized. Patients were given the opportunity to discuss these feelings with their peers in preparation for discharge.  Therapeutic Goals  Patient will identify their overall feelings about pending discharge. Patient will think about how they might proactively address issues that they believe will once again arise once they get home (i.e. with parents). Patients will participate in discussion about having hope for change.   Summary of Patient Progress:  Maebel was very active throughout the session. and demonstrated good insight into the subject matter, and proved open to input from peers and feedback from CSW. Khilee was respectful of peers and participated throughout the entire session.   Therapeutic Modalities Cognitive Behavioral Therapy   Ane Payment, LCSW 04/19/2023  1:15 PM

## 2023-04-19 NOTE — Group Note (Deleted)
Date:  04/19/2023 Time:  8:42 AM  Group Topic/Focus:  Goals Group:   The focus of this group is to help patients establish daily goals to achieve during treatment and discuss how the patient can incorporate goal setting into their daily lives to aide in recovery.     Participation Level:  {BHH PARTICIPATION LEVEL:22264}  Participation Quality:  {BHH PARTICIPATION QUALITY:22265}  Affect:  {BHH AFFECT:22266}  Cognitive:  {BHH COGNITIVE:22267}  Insight: {BHH Insight2:20797}  Engagement in Group:  {BHH ENGAGEMENT IN GROUP:22268}  Modes of Intervention:  {BHH MODES OF INTERVENTION:22269}  Additional Comments:  ***  Tasnim Balentine T Yilin Weedon 04/19/2023, 8:42 AM  

## 2023-04-19 NOTE — Plan of Care (Signed)
  Problem: Education: Goal: Knowledge of [place] [facility] Education information/materials will improve Outcome: Progressing Goal: Mental status will improve Outcome: Progressing   Problem: Activity: Goal: Sleeping patterns will improve Outcome: Progressing   Problem: Coping: Goal: Ability to verbalize frustrations and anger appropriately will improve Outcome: Progressing   Problem: Coping: Goal: Ability to identify and develop effective coping behavior will improve Outcome: Progressing

## 2023-04-19 NOTE — Progress Notes (Signed)
Brittney Cooper, Chaplain Spiritual care group on Spiritual Resources highlighting hope today facilitated by Chaplain Dyanne Carrel, Bcc and Chaplain Gayla Medicus  Group Goal: Support / Education around spiritual resources - Hope  Members engage in facilitated group support and psycho-social education.  Group Description:  Following introductions and group rules, group members engaged in facilitated group dialogue and support around topic the topic of hope. There is particular support around what brings them hope and what makes them feel hopeless. Group identified types of hope and identified patterns, circumstances, and changes that precipitate being hopeless. Group reflected on thoughts / feelings around hope, normalized hopeless responses, and recognized a variety in hope experiences. Group encouraged individual reflection on what brings them hope and drew inspiration from pictures that represent hope to each of them.   Patient Progress: Patient attended group and actively participated in discussion and group activities. Patient engaged in conversation around the topic of hope, and contributions were received well by the group and showed good insight on the group topic.     Participation Level:  Active Participation Quality:  Appropriate Insight: Appropriate Engagement in Group:  Engaged Modes of Intervention:  Discussion and Pictures  [name]

## 2023-04-19 NOTE — Group Note (Signed)
Date:  04/19/2023 Time:  3:16 PM  Group Topic/Focus:  Dimensions of Wellness:   The focus of this group is to introduce the topic of wellness and discuss the role each dimension of wellness plays in total health.    Participation Level:  Active  Participation Quality:  Appropriate  Affect:  Appropriate  Cognitive:  Appropriate  Insight: Appropriate  Engagement in Group:  Engaged  Modes of Intervention:  Problem-solving  Additional Comments:     Reymundo Poll 04/19/2023, 3:16 PM

## 2023-04-19 NOTE — Progress Notes (Signed)
[facility] MD Progress Note  04/19/2023 12:06 PM SHARNELLE CAPPELLI  MRN:  [id number]   Reason for Admission:  Brittney Cooper is a 62 y.o., female with a past psychiatric history significant for bipolar disorder, alcohol use disorder who presents to the [facility] from [facility] emergency room for evaluation and management of increased depression and SI, alcohol dependence.  According to outside records, the patient patient presented there reporting drinking daily recently also possibly took too 10 mg oxycodone presented tearful reporting passive SI.The patient is currently on Hospital Day 3.   Chart Review from last 24 hours:  Staff report the patient has been compliant with medications.  She has been endorsing crying spells and stressors.  She received as needed trazodone and hydroxyzine at night.  She also endorsed auditory and visual hallucinations and claims that she sees people saying mean things to her.  Information Obtained Today During Patient Interview: The patient was seen and evaluated today.  On assessment patient was sitting in her room with the wheelchair next to her.  Despite apparent she slept 7.2 pounds she feels that she slept poorly and something was waking up.  She she reports that her depression is better at a 2/10.  She has passive suicidal ideations.  She denies active withdrawals although she is on the CIWA protocol.  She did endorse some hallucinations earlier this morning.  She claims that she has been having a hard time ever since her son went to college last August as he started drinking again.  She does have a service dog at home and she is worried about it.  She normally does not use a wheelchair except she feels unsteady on her withdrawals.  Apparently patient was also using Adderall, Xanax and alcohol prior to admission. Her cardiologist was also contacted yesterday regarding her medications.  Apparently she has been noncompliant. When seen today patient is alert  and oriented cooperative and little less labile.  She is contracting for safety.  She does endorse the fact that she needs help to prevent her from relapsing again.  She is hoping to go to an IOP follow-up to the partial hospital program in addition to attending AA meetings upon discharge.   Sleep  Patient slept fairly well but reports poor sleep pattern.  Principal Problem: Severe bipolar I disorder, most recent episode depressed Diagnosis: Principal Problem:   Severe bipolar I disorder, most recent episode depressed Active Problems:   Alcohol dependence    Past Psychiatric History:  Prior Psychiatric diagnoses: Bipolar disorder type II diagnoses given 5 years ago per patient's report Past Psychiatric Hospitalizations: 2 previous hospitalization first [facility] 8s, second time 6 years ago at [facility]   History of self mutilation: Denies Past suicide attempts: Denies Past history of HI, violent or aggressive behavior: Denies   Past Psychiatric medications trials: Reports tried Abilify 3 years ago but unsure regarding result of treatment, tried Depakote in the past and reported caused "blood sugar fluctuates" History of ECT/TMS: Denies   Outpatient psychiatric Follow up: Sees Dr. 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   Allergy    Anxiety    Arthritis  Bipolar disorder (HCC) 2    Cardiac arrest 06-2015, 08-2016   x3- pt has pacemaker    Chronic pain    back   Complication of anesthesia    woke up during 2 surgical procedure hydterectomy and colonscopy   Depression    Diabetes mellitus without complication    Type 2   Esophageal mass 02/2019    has upper endo with [place] with biopsy   Fatty liver    Fracture of lateral malleolus of right fibula 06/2015   GERD (gastroesophageal reflux disease)    history ofHepatomegaly    Hyperlipidemia    Hypertension    Neurocardiogenic syncope    faints 2 x week   Neurocardiogenic syncope 09/13/2022   Neuromuscular disorder    Dystonia   OCD (obsessive compulsive disorder)    OCD (obsessive compulsive disorder)    Osteoporosis    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   Pancreatic pseudocyst    Panic attack    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, working on OB floor as a nurse   Syncope    neurocardiogenic   Third degree heart block     Past Surgical History:  Procedure Laterality Date   ABDOMINAL HYSTERECTOMY  2009   ANTERIOR AND POSTERIOR REPAIR  2009   BONE EXCISION Right 07/08/2022   Procedure: PARTIAL FIBULA BONE EXCISION WITH I&D;  Surgeon: Sheral Apley, MD;  [facility];  Service: Orthopedics;  Laterality: Right;   BUNIONECTOMY Bilateral    CESAREAN SECTION     EP IMPLANTABLE DEVICE N/A 10/12/2016   Procedure: Biotronik Pacemaker Implant;  Surgeon: Will Jorja Loa, MD;  Location: [facility] INVASIVE CV LAB;  Service: Cardiovascular;  Laterality: N/A;   finfger surgery Left    left middle fingger   FINGER SURGERY     HARDWARE REMOVAL Right 05/23/2022   Procedure: HARDWARE REMOVAL;  Surgeon: Teryl Lucy, MD;  Location: [facility] OR;  Service: Orthopedics;  Laterality: Right;   I & D EXTREMITY Right 07/30/2022   Procedure: PARTIAL EXCISION RIGHT FIBULA;  Surgeon: Nadara Mustard, MD;  Location: [facility] OR;  Service: Orthopedics;  Laterality: Right;   INCISION AND DRAINAGE ABSCESS Right 05/23/2022   Procedure: INCISION AND DRAINAGE ABSCESS, RIGHT ANKLE;  Surgeon: Teryl Lucy, MD;  Location: [facility] OR;  Service: Orthopedics;  Laterality: Right;   LTCS     MYRINGOTOMY  1966   ORIF ANKLE FRACTURE Right 07/25/2015   Procedure: OPEN  REDUCTION INTERNAL FIXATION (ORIF) RIGHT ANKLE ;  Surgeon: Sheral Apley, MD;  Location: [facility];  Service: Orthopedics;  Laterality: Right;   RHINOPLASTY     age 89   sub mandibular cyst removal from right side of tongue  2019   TONSILLECTOMY  1966   and adenoids removed   TUBAL LIGATION  2005   Family History:  Family History  Problem Relation Age of Onset   Stroke Mother    Kidney disease Mother    Colon polyps Mother    Hypertension Mother    Kidney failure Mother    Lung cancer Father        stage IV    Colon polyps Father    Hypertension Father    Stomach cancer Maternal Grandfather    Colon cancer Paternal Grandfather    Hypertension Sister    Hyperlipidemia Sister    Hypertension Brother    Hyperlipidemia Brother    Rectal cancer Neg Hx  Esophageal cancer Neg Hx    Pancreatic cancer Neg Hx    Family Psychiatric  History:  Psychiatric illness: Father and sister have depression Suicide: Denies Substance Abuse: Father and son have alcohol-related problems Social History:  Living situation: Lives alone in [place] in a house Social support: Limited Marital Status: Was married 3 times, divorced for 14 years Children: 5 children 3 sons and 2 daughters age 53-40 Education: Patient reports she has degree in nursing Employment: Unemployed on disability 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 problems Military service: Denies Legal history: Denies pending charges or court dates Trauma: Denies Access to guns: Denies  Current Medications: Current Facility-Administered Medications  Medication Dose Route Frequency Provider Last Rate Last Admin   alum & mag hydroxide-simeth (MAALOX/MYLANTA) 200-200-20 MG/5ML suspension 30 mL  30 mL Oral Q4H PRN Massengill, Harrold Donath, MD       ARIPiprazole (ABILIFY) tablet 10 mg  10 mg Oral Daily Attiah, Nadir, MD   10 mg at 04/19/23 0901   atomoxetine (STRATTERA) capsule 25 mg  25 mg Oral Daily Attiah, Nadir, MD    25 mg at 04/19/23 0901   bisoprolol (ZEBETA) tablet 5 mg  5 mg Oral Daily Attiah, Nadir, MD   5 mg at 04/19/23 0901   diphenhydrAMINE (BENADRYL) capsule 25 mg  25 mg Oral QHS PRN Massengill, Harrold Donath, MD   25 mg at 04/17/23 0102   doxazosin (CARDURA) tablet 1 mg  1 mg Oral Daily Massengill, Nathan, MD   1 mg at 04/18/23 2144   flecainide (TAMBOCOR) tablet 50 mg  50 mg Oral BID Massengill, Harrold Donath, MD   50 mg at 04/19/23 0902   folic acid (FOLVITE) tablet 1 mg  1 mg Oral Daily Massengill, Nathan, MD   1 mg at 04/19/23 0902   gabapentin (NEURONTIN) capsule 300 mg  300 mg Oral BID Abbott Pao, Nadir, MD   300 mg at 04/19/23 0902   glipiZIDE (GLUCOTROL) tablet 10 mg  10 mg Oral QAC breakfast Abbott Pao, Nadir, MD   10 mg at 04/19/23 0630   hydrOXYzine (ATARAX) tablet 25 mg  25 mg Oral TID PRN Sarita Bottom, MD   25 mg at 04/18/23 2144   insulin aspart (novoLOG) injection 0-15 Units  0-15 Units Subcutaneous TID WC Attiah, Nadir, MD   8 Units at 04/19/23 1156   LORazepam (ATIVAN) tablet 0.5 mg  0.5 mg Oral BID Abbott Pao, Nadir, MD   0.5 mg at 04/19/23 0901   losartan (COZAAR) tablet 25 mg  25 mg Oral Daily Attiah, Nadir, MD   25 mg at 04/19/23 0902   magnesium hydroxide (MILK OF MAGNESIA) suspension 30 mL  30 mL Oral Daily PRN Massengill, Harrold Donath, MD       metFORMIN (GLUCOPHAGE) tablet 1,000 mg  1,000 mg Oral BID Massengill, Harrold Donath, MD   1,000 mg at 04/19/23 0902   multivitamin with minerals tablet 1 tablet  1 tablet Oral Daily Massengill, Nathan, MD   1 tablet at 04/19/23 0902   nicotine (NICODERM CQ - dosed in mg/24 hours) patch 21 mg  21 mg Transdermal Daily Attiah, Nadir, MD   21 mg at 04/19/23 0902   ondansetron (ZOFRAN) tablet 4 mg  4 mg Oral Q8H PRN Abbott Pao, Nadir, MD   4 mg at 04/19/23 0905   rOPINIRole (REQUIP) tablet 1 mg  1 mg Oral QHS Massengill, Nathan, MD   1 mg at 04/18/23 2144   thiamine (Vitamin B-1) tablet 100 mg  100 mg Oral Daily Massengill,  Harrold Donath, MD   100 mg at 04/19/23 1610   Or   thiamine  (VITAMIN B1) injection 100 mg  100 mg Intravenous Daily Massengill, Nathan, MD       traZODone (DESYREL) tablet 50 mg  50 mg Oral QHS PRN Sarita Bottom, MD   50 mg at 04/18/23 2145    Lab Results:  Results for orders placed or performed during the hospital encounter of 04/16/23 (from the past 48 hour(s))  Glucose, capillary     Status: Abnormal   Collection Time: 04/17/23 12:08 PM  Result Value Ref Range   Glucose-Capillary 361 (H) 70 - 99 mg/dL    Comment: Glucose reference range applies only to samples taken after fasting for at least 8 hours.  Glucose, capillary     Status: Abnormal   Collection Time: 04/17/23  4:55 PM  Result Value Ref Range   Glucose-Capillary 367 (H) 70 - 99 mg/dL    Comment: Glucose reference range applies only to samples taken after fasting for at least 8 hours.  Glucose, capillary     Status: Abnormal   Collection Time: 04/17/23  8:24 PM  Result Value Ref Range   Glucose-Capillary 146 (H) 70 - 99 mg/dL    Comment: Glucose reference range applies only to samples taken after fasting for at least 8 hours.   Comment 1 Notify RN    Comment 2 Document in Chart   Glucose, capillary     Status: Abnormal   Collection Time: 04/18/23  5:46 AM  Result Value Ref Range   Glucose-Capillary 189 (H) 70 - 99 mg/dL    Comment: Glucose reference range applies only to samples taken after fasting for at least 8 hours.   Comment 1 Notify RN    Comment 2 Document in Chart   Comprehensive metabolic panel     Status: Abnormal   Collection Time: 04/18/23  6:33 AM  Result Value Ref Range   Sodium 133 (L) 135 - 145 mmol/L   Potassium 3.4 (L) 3.5 - 5.1 mmol/L   Chloride 91 (L) 98 - 111 mmol/L   CO2 30 22 - 32 mmol/L   Glucose, Bld 210 (H) 70 - 99 mg/dL    Comment: Glucose reference range applies only to samples taken after fasting for at least 8 hours.   BUN 9 6 - 20 mg/dL   Creatinine, Ser 9.60 0.44 - 1.00 mg/dL   Calcium 9.8 8.9 - 45.4 mg/dL   Total Protein 7.5 6.5 - 8.1 g/dL    Albumin 4.3 3.5 - 5.0 g/dL   AST 61 (H) 15 - 41 U/L   ALT 40 0 - 44 U/L   Alkaline Phosphatase 100 38 - 126 U/L   Total Bilirubin 1.3 (H) 0.3 - 1.2 mg/dL   GFR, Estimated >09 >81 mL/min    Comment: (NOTE) Calculated using the CKD-EPI Creatinine Equation (2021)    Anion gap 12 5 - 15    Comment: Performed at [facility], [address], [place], [place] [address]  Hemoglobin A1c     Status: Abnormal   Collection Time: 04/18/23  6:33 AM  Result Value Ref Range   Hgb A1c MFr Bld 10.7 (H) 4.8 - 5.6 %    Comment: (NOTE) Pre diabetes:          5.7%-6.4%  Diabetes:              >6.4%  Glycemic control for   <7.0% adults with diabetes    Mean  Plasma Glucose 260.39 mg/dL    Comment: Performed at [facility], [address] [place], [place] [address]  Lipid panel     Status: None   Collection Time: 04/18/23  6:33 AM  Result Value Ref Range   Cholesterol 197 0 - 200 mg/dL   Triglycerides 604 <540 mg/dL   HDL 82 >98 mg/dL   Total CHOL/HDL Ratio 2.4 RATIO   VLDL 27 0 - 40 mg/dL   LDL Cholesterol 88 0 - 99 mg/dL    Comment:        Total Cholesterol/HDL:CHD Risk Coronary Heart Disease Risk Table                     Men   Women  1/2 Average Risk   3.4   3.3  Average Risk       5.0   4.4  2 X Average Risk   9.6   7.1  3 X Average Risk  23.4   11.0        Use the calculated Patient Ratio above and the CHD Risk Table to determine the patient's CHD Risk.        ATP III CLASSIFICATION (LDL):  <100     mg/dL   Optimal  119-147  mg/dL   Near or Above                    Optimal  130-159  mg/dL   Borderline  829-562  mg/dL   High  >130     mg/dL   Very High Performed at [facility], [address], [place], [place] [address]   Glucose, capillary     Status: Abnormal   Collection Time: 04/18/23 11:40 AM  Result Value Ref Range   Glucose-Capillary 391 (H) 70 - 99 mg/dL    Comment: Glucose reference range applies only to samples taken  after fasting for at least 8 hours.  Glucose, capillary     Status: Abnormal   Collection Time: 04/18/23  5:32 PM  Result Value Ref Range   Glucose-Capillary 247 (H) 70 - 99 mg/dL    Comment: Glucose reference range applies only to samples taken after fasting for at least 8 hours.  Glucose, capillary     Status: Abnormal   Collection Time: 04/18/23  9:15 PM  Result Value Ref Range   Glucose-Capillary 317 (H) 70 - 99 mg/dL    Comment: Glucose reference range applies only to samples taken after fasting for at least 8 hours.   Comment 1 Notify RN    Comment 2 Document in Chart   Glucose, capillary     Status: Abnormal   Collection Time: 04/19/23  6:07 AM  Result Value Ref Range   Glucose-Capillary 224 (H) 70 - 99 mg/dL    Comment: Glucose reference range applies only to samples taken after fasting for at least 8 hours.   Comment 1 Notify RN    Comment 2 Document in Chart   Glucose, capillary     Status: Abnormal   Collection Time: 04/19/23 11:52 AM  Result Value Ref Range   Glucose-Capillary 268 (H) 70 - 99 mg/dL    Comment: Glucose reference range applies only to samples taken after fasting for at least 8 hours.    Blood Alcohol level:  Lab Results  Component Value Date   ETH 215 (H) 04/15/2023   ETH 112 (H) 02/03/2017    Metabolic Disorder Labs: Lab Results  Component Value Date   HGBA1C 10.7 (H) 04/18/2023   MPG 260.39 04/18/2023   MPG 191.51 05/24/2022   No results found for: "PROLACTIN" Lab Results  Component Value Date   CHOL 197 04/18/2023   TRIG 136 04/18/2023   HDL 82 04/18/2023   CHOLHDL 2.4 04/18/2023   VLDL 27 04/18/2023   LDLCALC 88 04/18/2023   LDLCALC 138 (H) 11/30/2020    Physical Findings: AIMS: Facial and Oral Movements Muscles of Facial Expression: None, normal Lips and Perioral Area: None, normal Jaw: None, normal Tongue: None, normal,Extremity Movements Upper (arms, wrists, hands, fingers): None, normal Lower (legs, knees, ankles, toes):  None, normal, Trunk Movements Neck, shoulders, hips: None, normal, Overall Severity Severity of abnormal movements (highest score from questions above): None, normal Incapacitation due to abnormal movements: None, normal Patient's awareness of abnormal movements (rate only patient's report): No Awareness, Dental Status Current problems with teeth and/or dentures?: No Does patient usually wear dentures?: No  CIWA:  CIWA-Ar Total: 4 COWS:     Musculoskeletal: Strength & Muscle Tone: within normal limits Gait & Station: normal Patient leans: N/A  Psychiatric Specialty Exam:  General Appearance: Dressed casually, fairly groomed, appears at stated age.   Behavior: Cooperative, calm and pleasant   Psychomotor Activity:No psychomotor agitation or retardation noted    Eye Contact: Fair Speech: Within normal limits     Mood: Euthymic Affect: Pleasant, congruent   Thought Process: Linear and goal   Descriptions of Associations: Intact, no loose association Thought Content: Hallucinations: Denies AH, VH  Delusions: No paranoia  Suicidal Thoughts: Reports passive SI occasionally wishing self dead, denies active SI, intention, plan  Homicidal Thoughts: Denies HI, intention, plan    Alertness/Orientation: Alert and oriented   Insight: Improved Judgment: Improved   Memory: Intact   Executive Functions  Concentration: Fair Attention Span: Fair Recall: Intact Fund of Knowledge: Fair  Assets  Assets:Resilience; Manufacturing systems engineer    Physical Exam: Physical Exam Vitals and nursing note reviewed.  Constitutional:      Appearance: Normal appearance.  Neurological:     General: No focal deficit present.     Mental Status: She is alert and oriented to person, place, and time.    Review of Systems  Psychiatric/Behavioral:  Positive for depression and substance abuse.   All other systems reviewed and are negative.  Blood pressure (!) 135/96, pulse 93, temperature 98.5  F (36.9 C), temperature source Oral, resp. rate 16, height 5\' 4"  (1.626 m), weight 75.7 kg, SpO2 97 %. Body mass index is 28.65 kg/m.   Treatment Plan Summary: Daily contact with patient to assess and evaluate symptoms and progress in treatment and Medication management  ASSESSMENT:  Diagnoses / Active Problems: Principal Problem: Severe bipolar I disorder, most recent episode depressed Diagnosis: Principal Problem:   Severe bipolar I disorder, most recent episode depressed Active Problems:   Alcohol dependence   PLAN: Safety and Monitoring:  -- Involuntary admission to inpatient psychiatric unit for safety, stabilization and treatment  -- Daily contact with patient to assess and evaluate symptoms and progress in treatment  -- Patient's case to be discussed in multi-disciplinary team meeting  -- Observation Level : q15 minute checks  -- Vital signs:  q12 hours  -- Precautions: suicide, elopement, and assault  2. Medications:   Cymbalta was discontinued on 4/21             Continue Abilify being titrated today to 10 mg daily for mood stabilization and depression  Continue gabapentin 300 mg twice daily, primarily prescribed for neuropathy, will help with mood and alcohol dependence as well.             Vistaril 25 mg 3 times daily as needed for anxiety             Continue trazodone 50 mg at bedtime scheduled for sleep             Continue Ativan taper for alcohol withdrawals             Continue CIWA protocol for withdrawals, monitor CIWA score             Continue Cardura 1 mg daily, home medication for dysrhythmia             Continue Cozaar 25 mg daily, home medication for hypertension             Continue Glucophage 1000 mg twice daily for diabetes, home medication             Continue Requip 1 mg at bedtime, home medication for restless leg             Continue Tambocor 50 mg twice daily, home medication for dysrhythmia  Continue bisoprolol 5 mg daily for  dysrhythmia, home medication  Start Zofran as needed for nausea  Start Strattera daily for ADD  Started Glucotrol 10 mg daily for better management of diabetes as recommended by diabetes care management consult.   Patient was recommended to comply with psychiatric and medical medications after discharge and to comply with outpatient follow-up with all her providers for mental and physical ailments including diabetes, dysrhythmia and hypertension, she agreed.  The risks/benefits/side-effects/alternatives to this medication were discussed in detail with the patient and time was given for questions. The patient consents to medication trial.    -- Metabolic profile and EKG monitoring obtained while on an atypical antipsychotic (BMI: Lipid Panel: HbgA1c: QTc:)      3. Pertinent labs: Fingerstick blood sugars 353 and 177, CMP elevated LFT, potassium slightly low 3.4, CBC no significant abnormalities noted, UDS positive for benzodiazepine, hemoglobin A1c 5/23 8.3, EKG 4/19 QTc 431  CMP 4/22 LFT improved slightly elevated AST 61, ALT within normal level, potassium very slightly low at 3.4 no significant abnormalities noted otherwise, fasting lipid panel within normal level, hemoglobin A1c pending     Lab ordered: Hemoglobin A1c   4. Tobacco Use Disorder  -- Nicotine patch 21mg /24 hours ordered  -- Smoking cessation encouraged  5. Group and Therapy: -- Encouraged patient to participate in unit milieu and in scheduled group therapies     Today patient refusing referral for inpatient rehab treatment, reports interest in IOP referral, will follow.  -- Short Term Goals: Ability to identify changes in lifestyle to reduce recurrence of condition will improve, Ability to verbalize feelings will improve, Ability to disclose and discuss suicidal ideas, and Ability to demonstrate self-control will improve  -- Long Term Goals: Improvement in symptoms so as ready for discharge  6. Discharge Planning:   --  Social work and case management to assist with discharge planning and identification of hospital follow-up needs prior to discharge  -- Estimated LOS: Possible discharge by Friday.  Patient is strongly encouraged to continue to consider rehab programs.  -- Discharge Concerns: Need to establish a safety plan; Medication compliance and effectiveness  -- Discharge Goals: Return home with outpatient referrals for mental health follow-up including medication management/psychotherapy      Total Time  Spent in Direct Patient Care:  I personally spent 35 minutes on the unit in direct patient care. The direct patient care time included face-to-face time with the patient, reviewing the patient's chart, communicating with other professionals, and coordinating care. Greater than 50% of this time was spent in counseling or coordinating care with the patient regarding goals of hospitalization, psycho-education, and discharge planning needs.   Rex Kras, MD 04/19/2023, 12:06 PM Patient ID: Helmut Muster, female   DOB: Oct 22, 1961, 62 y.o.   MRN: [id number]

## 2023-04-19 NOTE — Progress Notes (Signed)
   04/19/23 0559  15 Minute Checks  Location Bedroom  Visual Appearance Calm  Behavior Sleeping  Sleep (Behavioral Health Patients Only)  Calculate sleep? (Click Yes once per 24 hr at 0600 safety check) Yes  Documented sleep last 24 hours 7.25

## 2023-04-19 NOTE — Progress Notes (Addendum)
Pt denied SI/HI/AVH. Patient rated her anxiety an 8/10. Pt complained of nausea/ vomiting this morning, PRN Zofran provided. Pt has been pleasant, calm, and cooperative throughout the shift. RN provided support and encouragement to patient. Pt given scheduled medications as prescribed. Q15 min checks verified for safety. Patient verbally contracts for safety. Patient compliant with medications and treatment plan. Patient is interacting well on the unit. Pt is safe on the unit.   04/19/23 0900  Psych Admission Type (Psych Patients Only)  Admission Status Voluntary  Psychosocial Assessment  Patient Complaints Anxiety;Depression  Eye Contact Fair  Facial Expression Animated;Anxious  Affect Anxious;Sad  Speech Logical/coherent  Interaction Assertive  Motor Activity Slow  Appearance/Hygiene Unremarkable  Behavior Characteristics Cooperative;Appropriate to situation  Mood Depressed;Anxious;Sad  Thought Process  Coherency WDL  Content WDL  Delusions None reported or observed  Perception WDL  Hallucination None reported or observed  Judgment Impaired  Confusion None  Danger to Self  Current suicidal ideation? Denies  Agreement Not to Harm Self No  Description of Agreement Pt verbally contracts for safety  Danger to Others  Danger to Others None reported or observed

## 2023-04-19 NOTE — Plan of Care (Signed)
  Problem: Education: Goal: Knowledge of  General Education information/materials will improve 04/19/2023 2313 by Olin Hauser I, RN Outcome: Progressing 04/19/2023 2254 by Olin Hauser I, RN Outcome: Progressing Goal: Mental status will improve Outcome: Progressing   Problem: Education: Goal: Ability to state activities that reduce stress will improve Outcome: Progressing   Problem: Self-Concept: Goal: Level of anxiety will decrease Outcome: Progressing   Problem: Education: Goal: Utilization of techniques to improve thought processes will improve Outcome: Progressing   Problem: Coping: Goal: Coping ability will improve Outcome: Progressing

## 2023-04-19 NOTE — Group Note (Signed)
Recreation Therapy Group Note   Group Topic:Animal Assisted Therapy   Group Date: 04/19/2023 Start Time: 1610 End Time: 1030 Facilitators: Ronan Duecker-McCall, LRT,CTRS Location: [address]   Animal-Assisted Activity (AAA) Program Checklist/Progress Notes Patient Eligibility Criteria Checklist & Daily Group note for Rec Tx Intervention  AAA/T Program Assumption of Risk Form signed by Patient/ or Parent Legal Guardian Yes  Patient is free of allergies or severe asthma Yes  Patient reports no fear of animals Yes  Patient reports no history of cruelty to animals Yes  Patient understands his/her participation is voluntary Yes  Patient washes hands before animal contact Yes  Patient washes hands after animal contact Yes   Affect/Mood: Happy   Participation Level: Engaged   Participation Quality: Independent   Behavior: Appropriate   Speech/Thought Process: Focused    Clinical Observations/Individualized Feedback: Pt attended and participated in group session.   Plan: Continue to engage patient in RT group sessions 2-3x/week.   Loucille Takach-McCall, LRT,CTRS 04/19/2023 12:54 PM

## 2023-04-20 LAB — GLUCOSE, CAPILLARY
Glucose-Capillary: 184 mg/dL — ABNORMAL HIGH (ref 70–99)
Glucose-Capillary: 294 mg/dL — ABNORMAL HIGH (ref 70–99)
Glucose-Capillary: 183 mg/dL — ABNORMAL HIGH (ref 70–99)
Glucose-Capillary: 224 mg/dL — ABNORMAL HIGH (ref 70–99)

## 2023-04-20 MED ORDER — ATOMOXETINE HCL 40 MG PO CAPS
40.0000 mg | ORAL_CAPSULE | Freq: Every day | ORAL | Status: DC
  Administered 2023-04-21 – 2023-04-22 (×2): 40 mg via ORAL
  Filled 2023-04-20 (×4): qty 1

## 2023-04-20 MED ORDER — TRAZODONE HCL 100 MG PO TABS
100.0000 mg | ORAL_TABLET | Freq: Every evening | ORAL | Status: DC | PRN
  Administered 2023-04-20 – 2023-04-21 (×2): 100 mg via ORAL
  Filled 2023-04-20 (×2): qty 1

## 2023-04-20 NOTE — Progress Notes (Signed)
   04/20/23 1955  Psych Admission Type (Psych Patients Only)  Admission Status Voluntary  Psychosocial Assessment  Patient Complaints Anxiety  Eye Contact Fair  Facial Expression Animated  Affect Anxious  Speech Logical/coherent  Interaction Assertive  Motor Activity Slow  Appearance/Hygiene Unremarkable  Behavior Characteristics Cooperative;Appropriate to situation  Mood Anxious;Pleasant  Thought Process  Coherency WDL  Content WDL  Delusions None reported or observed  Perception WDL  Hallucination None reported or observed  Judgment Poor  Confusion None  Danger to Self  Current suicidal ideation? Denies  Agreement Not to Harm Self Yes  Description of Agreement pt verbally contracts for safety.  Danger to Others  Danger to Others None reported or observed

## 2023-04-20 NOTE — Plan of Care (Signed)
  Problem: Education: Goal: Emotional status will improve Outcome: Progressing Goal: Mental status will improve Outcome: Progressing Goal: Verbalization of understanding the information provided will improve Outcome: Progressing   Problem: Activity: Goal: Interest or engagement in activities will improve Outcome: Progressing   Problem: Coping: Goal: Ability to verbalize frustrations and anger appropriately will improve Outcome: Progressing Goal: Ability to demonstrate self-control will improve Outcome: Progressing   Problem: Safety: Goal: Periods of time without injury will increase Outcome: Progressing   Problem: Education: Goal: Ability to state activities that reduce stress will improve Outcome: Progressing   Problem: Self-Concept: Goal: Ability to identify factors that promote anxiety will improve Outcome: Progressing Goal: Level of anxiety will decrease Outcome: Progressing Goal: Ability to modify response to factors that promote anxiety will improve Outcome: Progressing   Problem: Education: Goal: Utilization of techniques to improve thought processes will improve Outcome: Progressing Goal: Knowledge of the prescribed therapeutic regimen will improve Outcome: Progressing   Problem: Activity: Goal: Interest or engagement in leisure activities will improve Outcome: Progressing Goal: Imbalance in normal sleep/wake cycle will improve Outcome: Progressing   Problem: Coping: Goal: Coping ability will improve Outcome: Progressing Goal: Will verbalize feelings Outcome: Progressing   Problem: Health Behavior/Discharge Planning: Goal: Ability to make decisions will improve Outcome: Progressing Goal: Compliance with therapeutic regimen will improve Outcome: Progressing   Problem: Role Relationship: Goal: Will demonstrate positive changes in social behaviors and relationships Outcome: Progressing   Problem: Safety: Goal: Ability to disclose and discuss  suicidal ideas will improve Outcome: Progressing

## 2023-04-20 NOTE — Progress Notes (Addendum)
Pt denied SI/HI/AVH this morning. Pt rated her anxiety a 6/10. PRN hydroxyzine administered for anxiety. Pt reports that she slept poorly last night and requested increase in trazodone dose, MD notified. Pt has been pleasant, calm, and cooperative throughout the shift. RN provided support and encouragement to patient. Pt given scheduled medications as prescribed. Q15 min checks verified for safety. Patient verbally contracts for safety. Patient compliant with medications and treatment plan. Patient is interacting well on the unit. Pt is safe on the unit.   04/20/23 1100  Psych Admission Type (Psych Patients Only)  Admission Status Voluntary  Psychosocial Assessment  Patient Complaints Anxiety  Eye Contact Fair  Facial Expression Animated  Affect Anxious  Speech Logical/coherent  Interaction Assertive  Motor Activity Slow  Appearance/Hygiene Unremarkable  Behavior Characteristics Cooperative;Appropriate to situation  Mood Anxious;Pleasant  Thought Process  Coherency WDL  Content WDL  Delusions None reported or observed  Perception WDL  Hallucination None reported or observed  Judgment Impaired  Confusion None  Danger to Self  Current suicidal ideation? Denies  Agreement Not to Harm Self Yes  Description of Agreement Pt verbally contracts for safety  Danger to Others  Danger to Others None reported or observed

## 2023-04-20 NOTE — Group Note (Signed)
Recreation Therapy Group Note   Group Topic:Health and Wellness  Group Date: 04/20/2023 Start Time: 0930 End Time: 0955 Facilitators: Garison Genova-McCall, LRT,CTRS Location: [address]   Goal Area(s) Addresses:  Patient will define components of whole wellness. Patient will verbalize benefit of whole wellness.   Group Description:  Exercise.  LRT and patients discussed the importance on physical exercise and its affects on the body.  LRT then explained to patients they would take turns leading the group in exercises of their choosing.  Patients were told to consider the physical abilities of peers and to anything that would be too challenging.  Patients could take breaks and get water as needed.    Affect/Mood: Appropriate   Participation Level: Engaged   Participation Quality: Independent   Behavior: Appropriate   Speech/Thought Process: Focused   Insight: Good   Judgement: Good   Modes of Intervention: Music   Patient Response to Interventions:  Engaged   Education Outcome:  In group clarification offered    Clinical Observations/Individualized Feedback: Pt attended, participated and social during group session.    Plan: Continue to engage patient in RT group sessions 2-3x/week.   Dewayne Jurek-McCall, LRT,CTRS 04/20/2023 12:15 PM

## 2023-04-20 NOTE — [facility] Group Notes (Signed)
[facility] Group Notes:  (Nursing/MHT/Case Management/Adjunct)  Date:  04/20/2023  Time:  12:54 AM  Type of Therapy:  Group Therapy  Participation Level:  Active  Participation Quality:  Appropriate  Affect:  Appropriate  Cognitive:  Appropriate  Insight:  Appropriate  Engagement in Group:  Engaged  Modes of Intervention:  Education  Summary of Progress/Problems: Goal work on D/C, day 6/10. Participated in positive self talk exercise.   Noah Delaine 04/20/2023, 12:54 AM

## 2023-04-20 NOTE — Progress Notes (Signed)
Morning BCG 184, patient refused insulin coverage per sliding scale, scheduled Glipizide, and breakfast, and stated she wants to sleep in.

## 2023-04-20 NOTE — Group Note (Signed)
Date:  04/20/2023 Time:  9:12 AM  Group Topic/Focus:  Goals Group:   The focus of this group is to help patients establish daily goals to achieve during treatment and discuss how the patient can incorporate goal setting into their daily lives to aide in recovery. Orientation:   The focus of this group is to educate the patient on the purpose and policies of crisis stabilization and provide a format to answer questions about their admission.  The group details unit policies and expectations of patients while admitted.    Participation Level:  Did Not Attend  Participation Quality:    Affect:    Cognitive:    Insight:   Engagement in Group:    Modes of Intervention:    Additional Comments:    Jaquita Rector 04/20/2023, 9:12 AM

## 2023-04-20 NOTE — Progress Notes (Signed)
[facility] MD Progress Note  04/20/2023 9:56 AM Brittney Cooper  MRN:  [id number]   Reason for Admission:  Brittney Cooper is a 62 y.o., female with a past psychiatric history significant for bipolar disorder, alcohol use disorder who presents to the [facility] from [facility] emergency room for evaluation and management of increased depression and SI, alcohol dependence.  According to outside records, the patient patient presented there reporting drinking daily recently also possibly took too 10 mg oxycodone presented tearful reporting passive SI.The patient is currently on Hospital Day 4.   Chart Review from last 24 hours:  The patient was seen today and her chart was reviewed and the case was discussed with the treatment team.  She has been compliant with medications and has not been showing significant signs and symptoms of withdrawals.  Compliant with medications.Focused on discharge.  Endorses poor sleep.  More steady on her feet.  Weight visual hallucinations.  Information Obtained Today During Patient Interview: The patient was seen and evaluated today.  On assessment the patient reports that she feels exhausted because she does not sleep well.  She has been taking trazodone 50 mg at night but is requesting a increase in the dosage.  Her withdrawals are better and today she is in denial of depression but reports high levels of anxiety.  She also denies any suicidal ideations.  She is contracting for safety.  Still focused on the Adderall which was changed to Strattera.  The plan is to increase the Strattera and the trazodone.  Plan discharged for Friday with his safety plan and IOP appointment.   Sleep  Patient slept fairly well but reports poor sleep pattern.  Principal Problem: Severe bipolar I disorder, most recent episode depressed Diagnosis: Principal Problem:   Severe bipolar I disorder, most recent episode depressed Active Problems:   Alcohol dependence    Past  Psychiatric History:  Prior Psychiatric diagnoses: Bipolar disorder type II diagnoses given 5 years ago per patient's report Past Psychiatric Hospitalizations: 2 previous hospitalization [facility] 43[facility], second time 6 years ago at [facility]   History of self mutilation: Denies Past suicide attempts: Denies Past history of HI, violent or aggressive behavior: Denies   Past Psychiatric medications trials: Reports tried Abilify 3 years ago but unsure regarding result of treatment, tried Depakote in the past and reported caused "blood sugar fluctuates" History of ECT/TMS: Denies   Outpatient psychiatric Follow up: Sees Dr. 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   Allergy    Anxiety    Arthritis    Bipolar disorder (HCC) 2    Cardiac arrest 06-2015, 08-2016   x3- pt has pacemaker    Chronic pain    back   Complication of anesthesia    woke up during 2 surgical procedure hydterectomy and colonscopy   Depression    Diabetes mellitus without complication    Type 2   Esophageal mass 02/2019   has upper endo with [place] with biopsy   Fatty liver    Fracture of lateral malleolus of right fibula 06/2015   GERD (gastroesophageal reflux disease)    history ofHepatomegaly  Hyperlipidemia    Hypertension    Neurocardiogenic syncope    faints 2 x week   Neurocardiogenic syncope 09/13/2022   Neuromuscular disorder    Dystonia   OCD (obsessive compulsive disorder)    OCD (obsessive compulsive disorder)    Osteoporosis    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   Pancreatic pseudocyst    Panic attack    PTSD (post-traumatic stress disorder)    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;  Service: Orthopedics;  Laterality: Right;   BUNIONECTOMY Bilateral    CESAREAN SECTION     EP IMPLANTABLE DEVICE N/A 10/12/2016   Procedure: Biotronik Pacemaker Implant;  Surgeon: Will Jorja Loa, MD;  Location: [facility] INVASIVE CV LAB;  Service: Cardiovascular;  Laterality: N/A;   finfger surgery Left    left middle fingger   FINGER SURGERY     HARDWARE REMOVAL Right 05/23/2022   Procedure: HARDWARE REMOVAL;  Surgeon: Teryl Lucy, MD;  Location: [facility] OR;  Service: Orthopedics;  Laterality: Right;   I & D EXTREMITY Right 07/30/2022   Procedure: PARTIAL EXCISION RIGHT FIBULA;  Surgeon: Nadara Mustard, MD;  Location: [facility] OR;  Service: Orthopedics;  Laterality: Right;   INCISION AND DRAINAGE ABSCESS Right 05/23/2022   Procedure: INCISION AND DRAINAGE ABSCESS, RIGHT ANKLE;  Surgeon: Teryl Lucy, MD;  Location: [facility] OR;  Service: Orthopedics;  Laterality: Right;   LTCS     MYRINGOTOMY  1966   ORIF ANKLE FRACTURE Right 07/25/2015   Procedure: OPEN REDUCTION INTERNAL FIXATION (ORIF) RIGHT ANKLE ;  Surgeon: Sheral Apley, MD;  Location: [facility];  Service: Orthopedics;  Laterality: Right;   RHINOPLASTY     age 33   sub mandibular cyst removal from right side of tongue  2019   TONSILLECTOMY  1966   and adenoids removed   TUBAL LIGATION  2005   Family History:  Family History  Problem Relation Age of Onset   Stroke Mother    Kidney disease Mother    Colon polyps Mother    Hypertension Mother    Kidney failure Mother    Lung cancer Father        stage IV    Colon polyps Father    Hypertension  Father    Stomach cancer Maternal Grandfather    Colon cancer Paternal Grandfather    Hypertension Sister    Hyperlipidemia Sister    Hypertension Brother    Hyperlipidemia Brother    Rectal cancer Neg Hx    Esophageal cancer Neg Hx    Pancreatic cancer Neg Hx    Family Psychiatric  History:  Psychiatric illness: Father and sister have depression Suicide: Denies Substance Abuse: Father and son have alcohol-related problems Social History:  Living situation: Lives alone in [place] in a house Social support: Limited Marital Status: Was married 3 times, divorced for 14 years Children: 5 children 3 sons and 2 daughters age 33-40 Education: Patient reports she has degree in nursing Employment: Unemployed on disability 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 problems Military service: Denies Legal history:  Denies pending charges or court dates Trauma: Denies Access to guns: Denies  Current Medications: Current Facility-Administered Medications  Medication Dose Route Frequency Provider Last Rate Last Admin   alum & mag hydroxide-simeth (MAALOX/MYLANTA) 200-200-20 MG/5ML suspension 30 mL  30 mL Oral Q4H PRN Massengill, Nathan, MD       ARIPiprazole (ABILIFY) tablet 10 mg  10 mg Oral Daily Attiah, Nadir, MD   10 mg at 04/20/23 0835   atomoxetine (STRATTERA) capsule 25 mg  25 mg Oral Daily Attiah, Nadir, MD   25 mg at 04/20/23 0834   bisoprolol (ZEBETA) tablet 5 mg  5 mg Oral Daily Attiah, Nadir, MD   5 mg at 04/20/23 1610   diphenhydrAMINE (BENADRYL) capsule 25 mg  25 mg Oral QHS PRN Massengill, Harrold Donath, MD   25 mg at 04/20/23 0122   doxazosin (CARDURA) tablet 1 mg  1 mg Oral Daily Massengill, Nathan, MD   1 mg at 04/19/23 2141   flecainide (TAMBOCOR) tablet 50 mg  50 mg Oral BID Phineas Inches, MD   50 mg at 04/20/23 9604   folic acid (FOLVITE) tablet 1 mg  1 mg Oral Daily Massengill, Nathan, MD   1 mg at 04/20/23 0834   gabapentin (NEURONTIN) capsule 300 mg  300 mg Oral BID  Abbott Pao, Nadir, MD   300 mg at 04/20/23 0835   glipiZIDE (GLUCOTROL) tablet 10 mg  10 mg Oral QAC breakfast Abbott Pao, Nadir, MD   10 mg at 04/19/23 0630   hydrOXYzine (ATARAX) tablet 25 mg  25 mg Oral TID PRN Abbott Pao, Nadir, MD   25 mg at 04/20/23 0120   insulin aspart (novoLOG) injection 0-15 Units  0-15 Units Subcutaneous TID WC Attiah, Nadir, MD   3 Units at 04/19/23 1713   losartan (COZAAR) tablet 25 mg  25 mg Oral Daily Attiah, Nadir, MD   25 mg at 04/20/23 0834   magnesium hydroxide (MILK OF MAGNESIA) suspension 30 mL  30 mL Oral Daily PRN Massengill, Harrold Donath, MD       metFORMIN (GLUCOPHAGE) tablet 1,000 mg  1,000 mg Oral BID Massengill, Harrold Donath, MD   1,000 mg at 04/20/23 5409   multivitamin with minerals tablet 1 tablet  1 tablet Oral Daily Massengill, Nathan, MD   1 tablet at 04/20/23 0834   nicotine (NICODERM CQ - dosed in mg/24 hours) patch 21 mg  21 mg Transdermal Daily Attiah, Nadir, MD   21 mg at 04/20/23 0837   ondansetron (ZOFRAN) tablet 4 mg  4 mg Oral Q8H PRN Abbott Pao, Nadir, MD   4 mg at 04/19/23 0905   rOPINIRole (REQUIP) tablet 1 mg  1 mg Oral QHS Massengill, Nathan, MD   1 mg at 04/19/23 2141   thiamine (Vitamin B-1) tablet 100 mg  100 mg Oral Daily Massengill, Harrold Donath, MD   100 mg at 04/20/23 8119   Or   thiamine (VITAMIN B1) injection 100 mg  100 mg Intravenous Daily Massengill, Nathan, MD       traZODone (DESYREL) tablet 100 mg  100 mg Oral QHS PRN Rex Kras, MD        Lab Results:  Results for orders placed or performed during the hospital encounter of 04/16/23 (from the past 48 hour(s))  Glucose, capillary     Status: Abnormal   Collection Time: 04/18/23 11:40 AM  Result Value Ref Range   Glucose-Capillary 391 (H) 70 - 99 mg/dL    Comment: Glucose reference range applies only to samples taken after fasting for at  least 8 hours.  Glucose, capillary     Status: Abnormal   Collection Time: 04/18/23  5:32 PM  Result Value Ref Range   Glucose-Capillary 247 (H) 70 - 99  mg/dL    Comment: Glucose reference range applies only to samples taken after fasting for at least 8 hours.  Glucose, capillary     Status: Abnormal   Collection Time: 04/18/23  9:15 PM  Result Value Ref Range   Glucose-Capillary 317 (H) 70 - 99 mg/dL    Comment: Glucose reference range applies only to samples taken after fasting for at least 8 hours.   Comment 1 Notify RN    Comment 2 Document in Chart   Glucose, capillary     Status: Abnormal   Collection Time: 04/19/23  6:07 AM  Result Value Ref Range   Glucose-Capillary 224 (H) 70 - 99 mg/dL    Comment: Glucose reference range applies only to samples taken after fasting for at least 8 hours.   Comment 1 Notify RN    Comment 2 Document in Chart   Glucose, capillary     Status: Abnormal   Collection Time: 04/19/23 11:52 AM  Result Value Ref Range   Glucose-Capillary 268 (H) 70 - 99 mg/dL    Comment: Glucose reference range applies only to samples taken after fasting for at least 8 hours.  Glucose, capillary     Status: Abnormal   Collection Time: 04/19/23  5:08 PM  Result Value Ref Range   Glucose-Capillary 175 (H) 70 - 99 mg/dL    Comment: Glucose reference range applies only to samples taken after fasting for at least 8 hours.  Glucose, capillary     Status: Abnormal   Collection Time: 04/19/23  9:11 PM  Result Value Ref Range   Glucose-Capillary 185 (H) 70 - 99 mg/dL    Comment: Glucose reference range applies only to samples taken after fasting for at least 8 hours.   Comment 1 Notify RN   Glucose, capillary     Status: Abnormal   Collection Time: 04/20/23  5:41 AM  Result Value Ref Range   Glucose-Capillary 184 (H) 70 - 99 mg/dL    Comment: Glucose reference range applies only to samples taken after fasting for at least 8 hours.   Comment 1 Notify RN     Blood Alcohol level:  Lab Results  Component Value Date   ETH 215 (H) 04/15/2023   ETH 112 (H) 02/03/2017    Metabolic Disorder Labs: Lab Results  Component  Value Date   HGBA1C 10.7 (H) 04/18/2023   MPG 260.39 04/18/2023   MPG 191.51 05/24/2022   No results found for: "PROLACTIN" Lab Results  Component Value Date   CHOL 197 04/18/2023   TRIG 136 04/18/2023   HDL 82 04/18/2023   CHOLHDL 2.4 04/18/2023   VLDL 27 04/18/2023   LDLCALC 88 04/18/2023   LDLCALC 138 (H) 11/30/2020    Physical Findings: AIMS: Facial and Oral Movements Muscles of Facial Expression: None, normal Lips and Perioral Area: None, normal Jaw: None, normal Tongue: None, normal,Extremity Movements Upper (arms, wrists, hands, fingers): None, normal Lower (legs, knees, ankles, toes): None, normal, Trunk Movements Neck, shoulders, hips: None, normal, Overall Severity Severity of abnormal movements (highest score from questions above): None, normal Incapacitation due to abnormal movements: None, normal Patient's awareness of abnormal movements (rate only patient's report): No Awareness, Dental Status Current problems with teeth and/or dentures?: No Does patient usually wear dentures?: No  CIWA:  CIWA-Ar  Total: 2 COWS:     Musculoskeletal: Strength & Muscle Tone: within normal limits Gait & Station: normal Patient leans: N/A  Psychiatric Specialty Exam:  General Appearance: Dressed casually, fairly groomed, appears at stated age.   Behavior: Cooperative, calm and pleasant   Psychomotor Activity:No psychomotor agitation or retardation noted    Eye Contact: Fair Speech: Within normal limits     Mood: Euthymic Affect: Pleasant, congruent   Thought Process: Linear and goal   Descriptions of Associations: Intact, no loose association Thought Content: Hallucinations: Denies AH, VH  Delusions: No paranoia  Suicidal Thoughts: Reports passive SI occasionally wishing self dead, denies active SI, intention, plan  Homicidal Thoughts: Denies HI, intention, plan    Alertness/Orientation: Alert and oriented   Insight: Improved Judgment: Improved   Memory:  Intact   Executive Functions  Concentration: Fair Attention Span: Fair Recall: Intact Fund of Knowledge: Fair  Assets  Assets:Communication Skills; Desire for Improvement    Physical Exam: Physical Exam Vitals and nursing note reviewed.  Constitutional:      Appearance: Normal appearance.  Neurological:     General: No focal deficit present.     Mental Status: She is alert and oriented to person, place, and time.    Review of Systems  Psychiatric/Behavioral:  Positive for depression and substance abuse.   All other systems reviewed and are negative.  Blood pressure (!) 138/95, pulse 98, temperature 98.9 F (37.2 C), temperature source Oral, resp. rate 16, height  (1.626 m), weight 75.7 kg, SpO2 97 %. Body mass index is 28.65 kg/m.   Treatment Plan Summary: Daily contact with patient to assess and evaluate symptoms and progress in treatment and Medication management  ASSESSMENT:  Diagnoses / Active Problems: Principal Problem: Severe bipolar I disorder, most recent episode depressed Diagnosis: Principal Problem:   Severe bipolar I disorder, most recent episode depressed Active Problems:   Alcohol dependence   PLAN: Safety and Monitoring:  -- Involuntary admission to inpatient psychiatric unit for safety, stabilization and treatment  -- Daily contact with patient to assess and evaluate symptoms and progress in treatment  -- Patient's case to be discussed in multi-disciplinary team meeting  -- Observation Level : q15 minute checks  -- Vital signs:  q12 hours  -- Precautions: suicide, elopement, and assault  2. Medications:   Cymbalta was discontinued on 4/21             Continue Abilify being titrated today to 10 mg daily for mood stabilization and depression              Continue gabapentin 300 mg twice daily, primarily prescribed for neuropathy, will help with mood and alcohol dependence as well.             Vistaril 25 mg 3 times daily as needed for  anxiety               Increase trazodone 100 mg at bedtime scheduled for sleep             Continue Ativan taper for alcohol withdrawals             Continue CIWA protocol for withdrawals, monitor CIWA score             Continue Cardura 1 mg daily, home medication for dysrhythmia             Continue Cozaar 25 mg daily, home medication for hypertension  Continue Glucophage 1000 mg twice daily for diabetes, home medication             Continue Requip 1 mg at bedtime, home medication for restless leg             Continue Tambocor 50 mg twice daily, home medication for dysrhythmia  Continue bisoprolol 5 mg daily for dysrhythmia, home medication  Start Zofran as needed for nausea  Increase Strattera to 40 mg a day  Started Glucotrol 10 mg daily for better management of diabetes as recommended by diabetes care management consult.   Patient was recommended to comply with psychiatric and medical medications after discharge and to comply with outpatient follow-up with all her providers for mental and physical ailments including diabetes, dysrhythmia and hypertension, she agreed.  The risks/benefits/side-effects/alternatives to this medication were discussed in detail with the patient and time was given for questions. The patient consents to medication trial.    -- Metabolic profile and EKG monitoring obtained while on an atypical antipsychotic (BMI: Lipid Panel: HbgA1c: QTc:)      3. Pertinent labs: Fingerstick blood sugars 353 and 177, CMP elevated LFT, potassium slightly low 3.4, CBC no significant abnormalities noted, UDS positive for benzodiazepine, hemoglobin A1c 5/23 8.3, EKG 4/19 QTc 431  CMP 4/22 LFT improved slightly elevated AST 61, ALT within normal level, potassium very slightly low at 3.4 no significant abnormalities noted otherwise, fasting lipid panel within normal level, hemoglobin A1c pending     Lab ordered: Hemoglobin A1c   4. Tobacco Use Disorder  -- Nicotine patch  /24 hours ordered  -- Smoking cessation encouraged  5. Group and Therapy: -- Encouraged patient to participate in unit milieu and in scheduled group therapies     Today patient refusing referral for inpatient rehab treatment, reports interest in IOP referral, will follow.  -- Short Term Goals: Ability to identify changes in lifestyle to reduce recurrence of condition will improve, Ability to verbalize feelings will improve, Ability to disclose and discuss suicidal ideas, and Ability to demonstrate self-control will improve  -- Long Term Goals: Improvement in symptoms so as ready for discharge  6. Discharge Planning:   -- Social work and case management to assist with discharge planning and identification of hospital follow-up needs prior to discharge  -- Estimated LOS: Possible discharge by Friday.  Patient is strongly encouraged to continue to consider rehab programs.  -- Discharge Concerns: Need to establish a safety plan; Medication compliance and effectiveness  -- Discharge Goals: Return home with outpatient referrals for mental health follow-up including medication management/psychotherapy      Total Time Spent in Direct Patient Care:  I personally spent 35 minutes on the unit in direct patient care. The direct patient care time included face-to-face time with the patient, reviewing the patient's chart, communicating with other professionals, and coordinating care. Greater than 50% of this time was spent in counseling or coordinating care with the patient regarding goals of hospitalization, psycho-education, and discharge planning needs.   Rex Kras, MD 04/20/2023, 9:56 AM Patient ID: Helmut Muster, female   DOB: September 19, 1961, 62 y.o.   MRN: [id number] Patient ID: FLORRIE RAMIRES, female   DOB: 1961/11/14, 62 y.o.   MRN: [id number]

## 2023-04-20 NOTE — Group Note (Signed)
Date:  04/20/2023 Time:  2:31 PM  Group Topic/Focus:  Emotional Education:   The focus of this group is to discuss what feelings/emotions are, and how they are experienced.    Participation Level:  Did Not Attend  Participation Quality:    Affect:    Cognitive:    Insight:   Engagement in Group:    Modes of Intervention:    Additional Comments:    Brittney Cooper 04/20/2023, 2:31 PM  

## 2023-04-20 NOTE — Progress Notes (Signed)
   04/19/23 2245  Psych Admission Type (Psych Patients Only)  Admission Status Voluntary  Psychosocial Assessment  Patient Complaints Anxiety  Eye Contact Fair  Facial Expression Animated  Affect Sad  Speech Logical/coherent  Interaction Assertive  Motor Activity Slow  Appearance/Hygiene Unremarkable  Behavior Characteristics Cooperative;Appropriate to situation  Mood Anxious;Depressed  Thought Process  Coherency WDL  Content WDL  Delusions None reported or observed  Perception WDL  Hallucination None reported or observed  Judgment Impaired  Confusion None  Danger to Self  Current suicidal ideation? Denies  Agreement Not to Harm Self Yes  Description of Agreement Verbal contract  Danger to Others  Danger to Others None reported or observed   Patient has been up multiple times, reports difficulty staying asleep, and rated her anxiety 8/10. Patient utilized her PRN medications; Hydroxyzine, Trazodone, and benadryl per [facility], with little or no effect as stated by patient per reassessment.

## 2023-04-20 NOTE — Progress Notes (Signed)
   04/20/23 0556  15 Minute Checks  Location Bedroom  Visual Appearance Calm  Behavior Sleeping  Sleep (Behavioral Health Patients Only)  Calculate sleep? (Click Yes once per 24 hr at 0600 safety check) Yes  Documented sleep last 24 hours 5.75

## 2023-04-20 NOTE — [facility] Group Notes (Signed)
Spiritual care group on grief and loss facilitated by Chaplain Dyanne Carrel, [facility]  Group Goal: Support / Education around grief and loss  Members engage in facilitated group support and psycho-social education.  Group Description:  Following introductions and group rules, group members engaged in facilitated group dialogue and support around topic of loss, with particular support around experiences of loss in their lives. Group Identified types of loss (relationships / self / things) and identified patterns, circumstances, and changes that precipitate losses. Reflected on thoughts / feelings around loss, normalized grief responses, and recognized variety in grief experience. Group encouraged individual reflection on safe space and on the coping skills that they are already utilizing.  Group drew on Adlerian / Rogerian and narrative framework  Patient Progress:

## 2023-04-21 LAB — GLUCOSE, CAPILLARY
Glucose-Capillary: 203 mg/dL — ABNORMAL HIGH (ref 70–99)
Glucose-Capillary: 243 mg/dL — ABNORMAL HIGH (ref 70–99)
Glucose-Capillary: 171 mg/dL — ABNORMAL HIGH (ref 70–99)
Glucose-Capillary: 184 mg/dL — ABNORMAL HIGH (ref 70–99)

## 2023-04-21 MED ORDER — INSULIN ASPART 100 UNIT/ML IJ SOLN: 0.0000 [IU] | Freq: Every day | INTRAMUSCULAR | Status: DC

## 2023-04-21 MED ORDER — INSULIN ASPART 100 UNIT/ML IJ SOLN: 0.0000 [IU] | Freq: Three times a day (TID) | INTRAMUSCULAR | Status: DC

## 2023-04-21 MED ORDER — IBUPROFEN 200 MG PO TABS
200.0000 mg | ORAL_TABLET | Freq: Four times a day (QID) | ORAL | Status: DC | PRN
  Administered 2023-04-21 – 2023-04-22 (×2): 200 mg via ORAL
  Filled 2023-04-21 (×2): qty 1

## 2023-04-21 NOTE — Group Note (Signed)
LCSW Group Therapy Note  Group Date: 04/21/2023 Start Time: 1100 End Time: 1200   Type of Therapy and Topic:  Group Therapy - How To Cope with Nervousness about Discharge   Participation Level:  Active   Description of Group This process group involved identification of patients' feelings about discharge. Some of them are scheduled to be discharged soon, while others are new admissions, but each of them was asked to share thoughts and feelings surrounding discharge from the hospital. One common theme was that they are excited at the prospect of going home, while another was that many of them are apprehensive about sharing why they were hospitalized. Patients were given the opportunity to discuss these feelings with their peers in preparation for discharge.  Therapeutic Goals  Patient will identify their overall feelings about pending discharge. Patient will think about how they might proactively address issues that they believe will once again arise once they get home (i.e. with parents). Patients will participate in discussion about having hope for change.   Summary of Patient Progress:  Brittney Cooper was very active throughout the session. Brittney Cooper demonstrated good insight into the subject matter, and proved open to input from peers and feedback from CSW. Brittney Cooper was respectful of peers and participated throughout the entire session.   Therapeutic Modalities Cognitive Behavioral Therapy   Ronnell Freshwater Karon Cotterill, LCSW 04/21/2023  1:10 PM
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
## 2023-04-21 NOTE — [facility] Group Notes (Signed)
[facility] Group Notes:  (Nursing/MHT/Case Management/Adjunct)  Date:  04/21/2023  Time:  2000  Type of Therapy:   wrap up group  Participation Level:  Active  Participation Quality:  Appropriate, Attentive, Sharing, and Supportive  Affect:  Flat  Cognitive:  Alert  Insight:  Improving  Engagement in Group:  Engaged  Modes of Intervention:  Clarification, Education, and Support  Summary of Progress/Problems: Positive thinking and positive change were discussed.   Johann Capers S 04/21/2023, 10:10 PM

## 2023-04-21 NOTE — Plan of Care (Signed)
Nurse discussed anxiety, depression and coping skills with patient.  

## 2023-04-21 NOTE — Progress Notes (Signed)
D:  Patient's self inventory sheet, patient has fair sleep, sleep medication given.  Fair appetite, low energy level, good concentration.  Rated depression and hopeless 1, anxiety 2.  Withdrawals, runny nose, irritability.  Denied SI.  Physical problems, lightheaded.  R ankle problems in the past.  Goal is discharge planning, appointments, schedule, etc.  Plans to call for appointment, scheduling.  Does have discharge plans. A:  Medications administered per MD orders.  Emotional support and encouragement given patient. R:  Denied SI and HI, contracts for safety.  Denied A/V hallucinations.  Safety maintained with 15 minute checks.

## 2023-04-22 LAB — GLUCOSE, CAPILLARY: Glucose-Capillary: 180 mg/dL — ABNORMAL HIGH (ref 70–99)

## 2023-04-22 MED ORDER — ROPINIROLE HCL 1 MG PO TABS: 1.0000 mg | ORAL_TABLET | Freq: Every day | ORAL | 0 refills | Status: DC

## 2023-04-22 MED ORDER — ATOMOXETINE HCL 40 MG PO CAPS: 40.0000 mg | ORAL_CAPSULE | Freq: Every day | ORAL | 0 refills | Status: DC

## 2023-04-22 MED ORDER — TRAZODONE HCL 100 MG PO TABS: 100.0000 mg | ORAL_TABLET | Freq: Every evening | ORAL | 0 refills | Status: DC | PRN

## 2023-04-22 MED ORDER — ARIPIPRAZOLE 10 MG PO TABS: 10.0000 mg | ORAL_TABLET | Freq: Every day | ORAL | 0 refills | Status: AC

## 2023-04-22 MED ORDER — GABAPENTIN 300 MG PO CAPS: 300.0000 mg | ORAL_CAPSULE | Freq: Two times a day (BID) | ORAL | 0 refills | Status: AC

## 2023-04-22 MED ORDER — NICOTINE 21 MG/24HR TD PT24: 21.0000 mg | MEDICATED_PATCH | Freq: Every day | TRANSDERMAL | 0 refills | Status: DC

## 2023-04-22 MED ORDER — GLIPIZIDE 10 MG PO TABS: 10.0000 mg | ORAL_TABLET | Freq: Every day | ORAL | 0 refills | Status: AC

## 2023-04-22 NOTE — Progress Notes (Signed)
  [facility] Adult Case Management Discharge Plan :  Will you be returning to the same living situation after discharge:  Yes,  pt will be returning to her residence At discharge, do you have transportation home?: Yes,  pt's father Do you have the ability to pay for your medications: Yes,  Insured  Release of information consent forms completed and in the chart;  Patient's signature needed at discharge.  Patient to Follow up at:  Follow-up Information     [facility] Follow up.   Why: You are scheduled for therapy on April 30th at 11:00am. Contact information: [address]  [address] [place], [place] [address] [phone]        [facility] Follow up on 04/25/2023.   Why: You are scheduled for a medication management appointment on 4/29 at 4:20pm Contact information: [address] [place], [place] [address] [phone]        [facility] Follow up on 04/25/2023.   Why: You are scheduled for an appointment on Monday 4/29 at 1:00pm with Laney Potash, LCSW. Contact information: [address] [place], [place] [address] [phone] (direct number)                Next level of care provider has access to [facility] Link:yes  Safety Planning and Suicide Prevention discussed: No.     Has patient been referred to the Quitline?: Patient refused referral  Patient has been referred for addiction treatment: Yes Patient to continue working towards treatment goals after discharge. Patient no longer meets criteria for inpatient criteria per attending physician. Continue taking medications as prescribed, nursing to provide instructions at discharge. Follow up with all scheduled appointments.   Antinio Sanderfer S Monserrate Blaschke, LCSW 04/22/2023, 9:24 AM

## 2023-04-22 NOTE — Progress Notes (Signed)
   04/21/23 2315  Vitals  BP (!) 151/111  MAP (mmHg) 124  BP Method Automatic  Pulse Rate 73  Pulse Rate Source Monitor  Oxygen Therapy  SpO2 98 %   Patient BP elevated, Nathaneil Canary, NP notified. Manual recheck obtained; 150/106 and 142/98. No acute distress noted, patient asymptomatic at this time and denies associated cardiac symptoms.

## 2023-04-22 NOTE — Progress Notes (Signed)
Nurse discussed discharge instructions with patient.

## 2023-04-22 NOTE — Progress Notes (Signed)
   04/21/23 2030  Psych Admission Type (Psych Patients Only)  Admission Status Voluntary  Psychosocial Assessment  Patient Complaints Anxiety  Eye Contact Fair  Facial Expression Animated  Affect Appropriate to circumstance;Anxious  Speech Logical/coherent  Interaction Assertive  Motor Activity Slow  Appearance/Hygiene Unremarkable  Behavior Characteristics Cooperative;Appropriate to situation  Mood Pleasant  Thought Process  Coherency WDL  Content WDL  Delusions None reported or observed  Perception WDL  Hallucination None reported or observed  Judgment Poor  Confusion None  Danger to Self  Current suicidal ideation? Denies  Agreement Not to Harm Self Yes  Description of Agreement Verbally contracts for safety.  Danger to Others  Danger to Others None reported or observed   Patient alert and oriented. Presenting appropriate to circumstance, anxious with a pleasant mood. Patient denies SI, HI, and AVH at this time. Patient rates anxiety 4/10 and depression 0/10. PRN hydroxyzine administered. Patient reports pain of the neck and back as 4/10, PRN ibuprofen administered, per provider orders. Scheduled medication administered to patient, per provider orders. PRN tazodone and benadryl administered for sleep, per provider orders. Support and encouragement provided. Routine safety checks conducted every 15 minutes. Patient verbally contracts for safety and remains safe on the unit.

## 2023-04-22 NOTE — [facility] Suicide Risk Assessment (Signed)
[facility] Discharge Suicide Risk Assessment   Principal Problem: Severe bipolar I disorder, most recent episode depressed (HCC) Discharge Diagnoses: Principal Problem:   Severe bipolar I disorder, most recent episode depressed (HCC) Active Problems:   Alcohol dependence (HCC)   Total Time spent with patient: 30 minutes  Musculoskeletal: Strength & Muscle Tone: within normal limits Gait & Station: normal Patient leans: N/A  Psychiatric Specialty Exam  Presentation  General Appearance:  Casual  Eye Contact: Fair  Speech: Clear and Coherent  Speech Volume: Normal  Handedness: Right   Mood and Affect  Mood: Irritable  Duration of Depression Symptoms: Greater than two weeks  Affect: Labile   Thought Process  Thought Processes: Linear  Descriptions of Associations:Intact  Orientation:Full (Time, Place and Person)  Thought Content:Rumination  History of Schizophrenia/Schizoaffective disorder:No  Duration of Psychotic Symptoms:No data recorded Hallucinations:Hallucinations: None  Ideas of Reference:None  Suicidal Thoughts:Suicidal Thoughts: No  Homicidal Thoughts:Homicidal Thoughts: No   Sensorium  Memory: Immediate Fair; Recent Fair; Remote Fair  Judgment: Fair  Insight: Fair   Art therapist  Concentration: Fair  Attention Span: Fair  Recall: [employer] of Knowledge: Fair  Language: Fair   Psychomotor Activity  Psychomotor Activity: Psychomotor Activity: Normal   Assets  Assets: Communication Skills; Desire for Improvement; Housing   Sleep  Sleep: Sleep: Fair   Physical Exam: Physical Exam ROS Blood pressure (!) 124/98, pulse 94, temperature 98.1 F (36.7 C), temperature source Oral, resp. rate 18, height 5\' 4"  (1.626 m), weight 75.7 kg, SpO2 96 %. Body mass index is 28.65 kg/m.  Mental Status Per Nursing Assessment::   On Admission:  NA  Demographic Factors:  Caucasian, Living alone, and  Unemployed  Loss Factors: NA  Historical Factors: Impulsivity  Risk Reduction Factors:   Sense of responsibility to family and Positive social support  Continued Clinical Symptoms:  Depression:   Impulsivity Alcohol/Substance Abuse/Dependencies  Cognitive Features That Contribute To Risk:  Thought constriction (tunnel vision)    Suicide Risk:  Mild:  Suicidal ideation of limited frequency, intensity, duration, and specificity.  There are no identifiable plans, no associated intent, mild dysphoria and related symptoms, good self-control (both objective and subjective assessment), few other risk factors, and identifiable protective factors, including available and accessible social support.   Follow-up Information     [facility] Follow up.   Why: You are scheduled for therapy on April 30th at 11:00am. Contact information: [address]  [address] [place], [place] [address] [phone]        [facility] Follow up on 04/25/2023.   Why: You are scheduled for a medication management appointment on 4/29 at 4:20pm Contact information: [address] [place], [place] [address] [phone]        [facility] Follow up on 04/25/2023.   Why: You are scheduled for an appointment on Monday 4/29 at 1:00pm with Laney Potash, LCSW. Contact information: [address] [place], [place] [address] [phone] (direct number)                Plan Of Care/Follow-up recommendations:  Activity:  As tolerated.  Rex Kras, MD 04/22/2023, 9:09 AM

## 2023-04-22 NOTE — Progress Notes (Signed)
Discharge:  Patient discharged home with her dad.  Suicide prevention information given and discussed with patient who stated she understood and had no questions.  Patient denied SI and HI.  Denied A/V hallucinations.  Patient stated she received all her belongings, clothing, toiletries, misc items, etc.  Patient stated she appreciated all assistance received from [facility] staff.  All required discharge information given. /

## 2023-04-22 NOTE — Progress Notes (Signed)
D:  Patient's self inventory sheet, patient has poor sleep, sleep medication helpful.  Fair appetite, low energy level, good concentration.  Denied depression, hopeless 1, anxiety 5.  Withdrawals, runny nose.  Denied SI.  Physical problems, pain.  Physical pain, lower back, aching, worst pain #4 in past 24 hours.  Pain medicine helpful.  Goal is discharge.  Plans to talk to SW.  Does have discharge plans. A:  Medications administered per MD orders.  Emotional support and encouragement given patient. R:  Denied SI and HI, contracts for safety.  Denied A/V hallucinations.  Safety maintained with 15 minute checks.

## 2023-04-22 NOTE — Discharge Summary (Signed)
Physician Discharge Summary Note  Patient:  Brittney Cooper is an 62 y.o., female MRN:  [id number] DOB:  [birth year] Patient phone:  [phone] (home)  Patient address:   [address] [place] [address],  Total Time spent with patient: 30 minutes  Date of Admission:  04/16/2023 Date of Discharge: 04/22/2023  Reason for Admission:  Brittney Cooper is a 62 y.o., female with a past psychiatric history significant for bipolar disorder, alcohol use disorder who presents to the [facility] from [facility] emergency room for evaluation and management of increased depression and SI, alcohol dependence.  According to outside records, the patient patient presented there reporting drinking daily recently also possibly took too 10 mg oxycodone presented tearful reporting passive SI.  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   Allergy    Anxiety    Arthritis    Bipolar disorder (HCC) 2    Cardiac arrest (HCC) 06-2015, 08-2016   x3- pt has pacemaker    Chronic pain    back   Complication of anesthesia    woke up during 2 surgical procedure hydterectomy and colonscopy   Depression    Diabetes mellitus without complication (HCC)    Type 2   Esophageal mass 02/2019   has upper endo with [place] with biopsy   Fatty liver    Fracture of lateral malleolus of right fibula 06/2015   GERD (gastroesophageal reflux disease)    history ofHepatomegaly    Hyperlipidemia    Hypertension    Neurocardiogenic syncope    faints 2 x week   Neurocardiogenic syncope 09/13/2022    Neuromuscular disorder (HCC)    Dystonia   OCD (obsessive compulsive disorder)    OCD (obsessive compulsive disorder)    Osteoporosis    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   Pancreatic pseudocyst    Panic attack    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, working on OB floor as a nurse   Syncope    neurocardiogenic   Third degree heart block (HCC)     Past Surgical History:  Procedure Laterality Date   ABDOMINAL HYSTERECTOMY  2009   ANTERIOR AND POSTERIOR REPAIR  2009   BONE EXCISION Right 07/08/2022   Procedure: PARTIAL FIBULA BONE EXCISION WITH I&D;  Surgeon: Sheral Apley, MD;  Location: [facility] [place];  Service: Orthopedics;  Laterality: Right;   BUNIONECTOMY Bilateral    CESAREAN SECTION     EP IMPLANTABLE DEVICE N/A 10/12/2016   Procedure: Biotronik Pacemaker Implant;  Surgeon: Will Jorja Loa, MD;  Location: [facility] INVASIVE CV LAB;  Service: Cardiovascular;  Laterality: N/A;   finfger surgery Left    left middle fingger   FINGER SURGERY     HARDWARE REMOVAL Right 05/23/2022   Procedure: HARDWARE REMOVAL;  Surgeon: Teryl Lucy, MD;  Location: [facility] OR;  Service: Orthopedics;  Laterality: Right;   I & D EXTREMITY Right 07/30/2022   Procedure: PARTIAL EXCISION RIGHT FIBULA;  Surgeon: Nadara Mustard, MD;  Location: [facility] OR;  Service: Orthopedics;  Laterality: Right;   INCISION  AND DRAINAGE ABSCESS Right 05/23/2022   Procedure: INCISION AND DRAINAGE ABSCESS, RIGHT ANKLE;  Surgeon: Teryl Lucy, MD;  Location: [facility] OR;  Service: Orthopedics;  Laterality: Right;   LTCS     MYRINGOTOMY  1966   ORIF ANKLE FRACTURE Right 07/25/2015   Procedure: OPEN REDUCTION INTERNAL FIXATION (ORIF) RIGHT ANKLE ;  Surgeon: Sheral Apley, MD;  Location: [facility];  Service: Orthopedics;  Laterality: Right;   RHINOPLASTY     age 62   sub mandibular cyst removal from right side of tongue  2019   TONSILLECTOMY  1966   and adenoids removed    TUBAL LIGATION  2005   Family History:  Family History  Problem Relation Age of Onset   Stroke Mother    Kidney disease Mother    Colon polyps Mother    Hypertension Mother    Kidney failure Mother    Lung cancer Father        stage IV    Colon polyps Father    Hypertension Father    Stomach cancer Maternal Grandfather    Colon cancer Paternal Grandfather    Hypertension Sister    Hyperlipidemia Sister    Hypertension Brother    Hyperlipidemia Brother    Rectal cancer Neg Hx    Esophageal cancer Neg Hx    Pancreatic cancer Neg Hx    Family Psychiatric  History: Please see H&P Social History:  Social History   Substance and Sexual Activity  Alcohol Use Yes   Comment: daily     Social History   Substance and Sexual Activity  Drug Use No    Social History   Socioeconomic History   Marital status: Divorced    Spouse name: Not on file   Number of children: Not on file   Years of education: Not on file   Highest education level: Not on file  Occupational History   Occupation: RN  Tobacco Use   Smoking status: Every Day    Packs/day: 1.00    Years: 15.00    Additional pack years: 0.00    Total pack years: 15.00    Types: Cigarettes   Smokeless tobacco: Never   Tobacco comments:    1/2 -1 ppd  Vaping Use   Vaping Use: Former   Substances: Nicotine  Substance and Sexual Activity   Alcohol use: Yes    Comment: daily   Drug use: No   Sexual activity: Not Currently    Birth control/protection: Surgical, Abstinence    Comment: Hysterectomy  Other Topics Concern   Not on file  Social History Narrative   Not on file   Social Determinants of Health   Financial Resource Strain: Not on file  Food Insecurity: No Food Insecurity (04/17/2023)   Hunger Vital Sign    Worried About Running Out of Food in the Last Year: Never true    Ran Out of Food in the Last Year: Never true  Transportation Needs: No Transportation Needs (04/17/2023)   PRAPARE -  Administrator, Civil Service (Medical): No    Lack of Transportation (Non-Medical): No  Physical Activity: Not on file  Stress: Not on file  Social Connections: Not on file    Hospital Course:  During the patient's hospitalization, patient had extensive initial psychiatric evaluation, and follow-up psychiatric evaluations every day.  Psychiatric diagnoses provided upon initial assessment: History of alcoholism, depression, suicidal ideations and a history of bipolar disorder type II.  Patient's psychiatric  medications were adjusted on admission: Patient was on detox and there were also changes in her medications including starting Strattera 20 mg a day which was titrated to 40 mg a day.  She was also on Abilify 10 mg a day.  Patient is on multiple home medications for diabetes and cardiac issues.  During the hospitalization, other adjustments were made to the patient's psychiatric medication regimen: Strattera was titrated to 40 mg.  Patient was maintained on Abilify.  Gabapentin was increased to 300 mg twice a day.  Patient's care was discussed during the interdisciplinary team meeting every day during the hospitalization.  The patient denied having side effects to prescribed psychiatric medication.  Gradually, patient started adjusting to milieu. The patient was evaluated each day by a clinical provider to ascertain response to treatment. Improvement was noted by the patient's report of decreasing symptoms, improved sleep and appetite, affect, medication tolerance, behavior, and participation in unit programming.  Patient was asked each day to complete a self inventory noting mood, mental status, pain, new symptoms, anxiety and concerns.    Symptoms were reported as significantly decreased or resolved completely by discharge.   On day of discharge, the patient reports that their mood is stable. The patient denied having suicidal thoughts for more than 48 hours prior to  discharge.  Patient denies having homicidal thoughts.  Patient denies having auditory hallucinations.  Patient denies any visual hallucinations or other symptoms of psychosis. The patient was motivated to continue taking medication with a goal of continued improvement in mental health.   The patient reports their target psychiatric symptoms of depression and alcohol abuse responded well to the psychiatric medications, and the patient reports overall benefit other psychiatric hospitalization. Supportive psychotherapy was provided to the patient. The patient also participated in regular group therapy while hospitalized. Coping skills, problem solving as well as relaxation therapies were also part of the unit programming.  Labs were reviewed with the patient, and abnormal results were discussed with the patient.  The patient is able to verbalize their individual safety plan to this provider.  # It is recommended to the patient to continue psychiatric medications as prescribed, after discharge from the hospital.    # It is recommended to the patient to follow up with your outpatient psychiatric provider and PCP.  # It was discussed with the patient, the impact of alcohol, drugs, tobacco have been there overall psychiatric and medical wellbeing, and total abstinence from substance use was recommended the patient.ed.  # Prescriptions provided or sent directly to preferred pharmacy at discharge. Patient agreeable to plan. Given opportunity to ask questions. Appears to feel comfortable with discharge.    # In the event of worsening symptoms, the patient is instructed to call the crisis hotline, 911 and or go to the nearest ED for appropriate evaluation and treatment of symptoms. To follow-up with primary care provider for other medical issues, concerns and or health care needs  # Patient was discharged home with a plan to follow up as noted below.   Physical Findings: AIMS: Facial and Oral  Movements Muscles of Facial Expression: None, normal Lips and Perioral Area: None, normal Jaw: None, normal Tongue: None, normal,Extremity Movements Upper (arms, wrists, hands, fingers): None, normal Lower (legs, knees, ankles, toes): None, normal, Trunk Movements Neck, shoulders, hips: None, normal, Overall Severity Severity of abnormal movements (highest score from questions above): None, normal Incapacitation due to abnormal movements: None, normal Patient's awareness of abnormal movements (rate only patient's report): No Awareness,  Dental Status Current problems with teeth and/or dentures?: No Does patient usually wear dentures?: No  CIWA:  CIWA-Ar Total: 2 COWS:     Musculoskeletal: Strength & Muscle Tone: within normal limits Gait & Station: normal Patient leans: N/A   Psychiatric Specialty Exam:  Presentation  General Appearance:  Appropriate for Environment  Eye Contact: Fair  Speech: Clear and Coherent  Speech Volume: Normal  Handedness: Right   Mood and Affect  Mood: Anxious  Affect: Appropriate   Thought Process  Thought Processes: Coherent  Descriptions of Associations:Intact  Orientation:Full (Time, Place and Person)  Thought Content:Logical  History of Schizophrenia/Schizoaffective disorder:No  Duration of Psychotic Symptoms:No data recorded Hallucinations:Hallucinations: None  Ideas of Reference:None  Suicidal Thoughts:Suicidal Thoughts: No  Homicidal Thoughts:Homicidal Thoughts: No   Sensorium  Memory: Immediate Fair; Remote Fair; Recent Fair  Judgment: Fair  Insight: Fair   Art therapist  Concentration: Fair  Attention Span: Fair  Recall: [employer] of Knowledge: Fair  Language: Fair   Psychomotor Activity  Psychomotor Activity: Psychomotor Activity: Normal   Assets  Assets: Communication Skills; Desire for Improvement; Housing   Sleep  Sleep: Sleep: Good    Physical  Exam: Physical Exam Vitals and nursing note reviewed.  Constitutional:      Appearance: Normal appearance.  Neurological:     General: No focal deficit present.     Mental Status: She is alert and oriented to person, place, and time. Mental status is at baseline.  Psychiatric:        Mood and Affect: Mood normal.        Behavior: Behavior normal.    Review of Systems  Psychiatric/Behavioral: Negative.    All other systems reviewed and are negative.  Blood pressure (!) 124/98, pulse 94, temperature 98.1 F (36.7 C), temperature source Oral, resp. rate 18, height 5\' 4"  (1.626 m), weight 75.7 kg, SpO2 96 %. Body mass index is 28.65 kg/m.   Social History   Tobacco Use  Smoking Status Every Day   Packs/day: 1.00   Years: 15.00   Additional pack years: 0.00   Total pack years: 15.00   Types: Cigarettes  Smokeless Tobacco Never  Tobacco Comments   1/2 -1 ppd   Tobacco Cessation:  A prescription for an FDA-approved tobacco cessation medication provided at discharge   Blood Alcohol level:  Lab Results  Component Value Date   ETH 215 (H) 04/15/2023   ETH 112 (H) 02/03/2017    Metabolic Disorder Labs:  Lab Results  Component Value Date   HGBA1C 10.7 (H) 04/18/2023   MPG 260.39 04/18/2023   MPG 191.51 05/24/2022   No results found for: "PROLACTIN" Lab Results  Component Value Date   CHOL 197 04/18/2023   TRIG 136 04/18/2023   HDL 82 04/18/2023   CHOLHDL 2.4 04/18/2023   VLDL 27 04/18/2023   LDLCALC 88 04/18/2023   LDLCALC 138 (H) 11/30/2020    See Psychiatric Specialty Exam and Suicide Risk Assessment completed by Attending Physician prior to discharge.  Discharge destination:  Home  Is patient on multiple antipsychotic therapies at discharge:  No   Has Patient had three or more failed trials of antipsychotic monotherapy by history:  No  Recommended Plan for Multiple Antipsychotic Therapies: NA  Discharge Instructions     Diet - low sodium heart  healthy   Complete by: As directed    Increase activity slowly   Complete by: As directed       Allergies as of 04/22/2023  Reactions   Codeine Nausea And Vomiting   Pt stated she also thought it made her hallucinate   Demerol [meperidine] Other (See Comments)   Hallucinations   Lopressor [metoprolol] Other (See Comments)   Nightmares    Norco [hydrocodone-acetaminophen] Nausea And Vomiting   Pt reports Vicodin caused "vomiting for six hours," reports being able to tolerate oxycodone and tramadol. Can take tylenol   Jardiance [empagliflozin] Other (See Comments)   Yeast infections        Medication List     STOP taking these medications    ALPRAZolam 0.5 MG tablet Commonly known as: XANAX   diphenhydrAMINE 25 MG tablet Commonly known as: BENADRYL   DULoxetine 60 MG capsule Commonly known as: CYMBALTA   guaiFENesin 600 MG 12 hr tablet Commonly known as: MUCINEX   midodrine 2.5 MG tablet Commonly known as: PROAMATINE   mupirocin ointment 2 % Commonly known as: BACTROBAN   omeprazole 40 MG capsule Commonly known as: PRILOSEC   oxyCODONE-acetaminophen 5-325 MG tablet Commonly known as: PERCOCET/ROXICET       TAKE these medications      Indication  ARIPiprazole 10 MG tablet Commonly known as: ABILIFY Take 1 tablet (10 mg total) by mouth daily. Start taking on: April 23, 2023  Indication: MIXED BIPOLAR AFFECTIVE DISORDER   aspirin EC 81 MG tablet Take 1 tablet (81 mg total) by mouth 2 (two) times daily. To prevent blood clots for 30 days after surgery.  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 Start taking on: April 23, 2023  Indication: Attention Deficit Hyperactivity Disorder   bisoprolol 5 MG tablet Commonly known as: ZEBETA TAKE 1 TABLET BY MOUTH DAILY  Indication: High Blood Pressure Disorder   doxazosin 1  MG tablet Commonly known as: CARDURA TAKE 1 TABLET BY MOUTH AT BEDTIME What changed:  when to take this reasons to take this  Indication: High Blood Pressure Disorder   flecainide 50 MG tablet Commonly known as: TAMBOCOR TAKE ONE (1) TABLET BY MOUTH TWO (2) TIMES DAILY What changed: See the new instructions.  Indication: Paroxysmal Supraventricular Tachycardia   gabapentin 300 MG capsule Commonly known as: NEURONTIN Take 1 capsule (300 mg total) by mouth 2 (two) times daily. What changed: when to take this  Indication: Abuse or Misuse of Alcohol   glipiZIDE 10 MG tablet Commonly known as: GLUCOTROL Take 1 tablet (10 mg total) by mouth daily before breakfast. Start taking on: April 23, 2023  Indication: Type 2 Diabetes   losartan 25 MG tablet Commonly known as: COZAAR TAKE ONE (1) TABLET BY MOUTH EACH DAY  Indication: High Blood Pressure Disorder   metFORMIN 1000 MG tablet Commonly known as: GLUCOPHAGE Take 1,000 mg by mouth 2 (two) times daily.  Indication: Type 2 Diabetes   Multivitamin Adult Chew Chew 1 each by mouth daily.  Indication: supplement   nicotine 21 mg/24hr patch Commonly known as: NICODERM CQ - dosed in mg/24 hours Place 1 patch (21 mg total) onto the skin daily. Start taking on: April 23, 2023  Indication: Nicotine Addiction   rOPINIRole 1 MG tablet Commonly known as: REQUIP Take 1 tablet (1 mg total) by mouth at bedtime. What changed: additional instructions  Indication: Restless Leg Syndrome   traZODone 100 MG tablet Commonly known as: DESYREL Take 1 tablet (100 mg total) by mouth at bedtime as needed for sleep.  Indication: Trouble Sleeping        Follow-up Information     [facility] Follow up.   Why: You are scheduled for therapy on April 30th at 11:00am. Contact information: [address]  [address] [place], [place] [address] [phone]        [facility] Follow up on  04/25/2023.   Why: You are scheduled for a medication management appointment on 4/29 at 4:20pm Contact information: [address] [place], [place] [address] [phone]        [facility] Follow up on 04/25/2023.   Why: You are scheduled for an appointment on Monday 4/29 at 1:00pm with Laney Potash, LCSW. Contact information: [address] [place], [place] [address] [phone] (direct number)                Follow-up recommendations:  Activity:  As tolerated Diet:  Diabetic diet Tests:  Followup with your cardiologist and PCP   Comments: The patient had a fairly eventful hospitalization.  She went through detox adequately.  She discussed her use of ADHD medications prior to admission and agreed with her treating physician to start her on Strattera.  She was also started on Abilify.  She denied any side effects from medications.  Ativan was discontinued.  During the hospitalization the risk of relapse was discussed.  Patient lives alone at home but does have family members who are fairly supportive and concerned.  She does not want to go to rehab but is willing to go to intensive outpatient program or partial hospital program.  Patient also agrees for her father to go to her house and clean out all the alcohol in the house.  She is contracting for safety. She is strongly encouraged to continue to discuss with her psychiatrist regarding the use of Strattera and Abilify. She is also strongly urged to follow-up with her cardiologist and primary care doctor. She is to abstain from any drinking and she plans to attend AA. Prognosis is fair to guarded.  Signed: Rex Kras, MD 04/22/2023, 9:14 AM

## 2023-04-25 ENCOUNTER — Ambulatory Visit (HOSPITAL_COMMUNITY): Payer: 59

## 2023-04-25 ENCOUNTER — Encounter (HOSPITAL_COMMUNITY): Payer: Self-pay

## 2023-04-25 ENCOUNTER — Telehealth (HOSPITAL_COMMUNITY): Payer: Self-pay | Admitting: Licensed Clinical Social Worker

## 2023-04-25 ENCOUNTER — Ambulatory Visit (HOSPITAL_COMMUNITY): Payer: 59 | Admitting: Licensed Clinical Social Worker

## 2023-04-25 NOTE — Telephone Encounter (Signed)
After Byrd Hesselbach no shows for her [facility] IOP assessment, the therapist attempts to reach her by phone leaving a HIPAA-compliant voicemail.  [address], MA, LCSW, [facility], LCAS 04/25/2023

## 2023-04-27 ENCOUNTER — Other Ambulatory Visit (HOSPITAL_BASED_OUTPATIENT_CLINIC_OR_DEPARTMENT_OTHER): Payer: Self-pay | Admitting: Cardiovascular Disease

## 2023-04-27 NOTE — Telephone Encounter (Signed)
Please call pt to schedule overdue 6 month follow-up with Dr. Duke Salvia for refills. She was supposed to f/u in March. Thank you!

## 2023-04-27 NOTE — Telephone Encounter (Signed)
This is Dr. Wildwood Crest's pt.  °

## 2023-04-28 ENCOUNTER — Telehealth (HOSPITAL_COMMUNITY): Payer: Self-pay | Admitting: Licensed Clinical Social Worker

## 2023-04-28 NOTE — Telephone Encounter (Signed)
The therapist returns Brittney Cooper's call about rescheduling her assessment to attend [facility] IOP. He leaves a HIPAA-compliant voicemail suggesting a tentative time of 1 p.m. on 05/02/23 asking that she call him back at [phone] to confirm.  Myrna Blazer, MA, LCSW, [facility], LCAS 04/28/2023

## 2023-05-03 ENCOUNTER — Telehealth (HOSPITAL_COMMUNITY): Payer: Self-pay | Admitting: Licensed Clinical Social Worker

## 2023-05-03 NOTE — Telephone Encounter (Signed)
The therapist calls Brittney Cooper returning her voicemail from yesterday and schedules her to come in for an assessment for [facility] IOP on 05/05/23 at 1 p.m.  Myrna Blazer, MA, LCSW, [facility], LCAS 05/03/2023

## 2023-05-04 ENCOUNTER — Telehealth: Payer: Self-pay | Admitting: Internal Medicine

## 2023-05-04 NOTE — Telephone Encounter (Signed)
Pt stated she needs a note that she can take Adderall and that it doesn't effect her heart. Her Psychiatrist is requesting she get this info to continue to take it. She'd like it faxed to [phone] or emailed to [email or web address]

## 2023-05-04 NOTE — Telephone Encounter (Signed)
Patient is scheduled 07/14/2023

## 2023-05-05 ENCOUNTER — Ambulatory Visit (INDEPENDENT_AMBULATORY_CARE_PROVIDER_SITE_OTHER): Payer: 59 | Admitting: Licensed Clinical Social Worker

## 2023-05-05 DIAGNOSIS — F1020 Alcohol dependence, uncomplicated: Secondary | ICD-10-CM

## 2023-05-05 DIAGNOSIS — F314 Bipolar disorder, current episode depressed, severe, without psychotic features: Secondary | ICD-10-CM
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
## 2023-05-06 NOTE — Telephone Encounter (Signed)
Letter completed and faxed to [facility] as requested by pt.

## 2023-05-09 ENCOUNTER — Telehealth (HOSPITAL_BASED_OUTPATIENT_CLINIC_OR_DEPARTMENT_OTHER): Payer: Self-pay | Admitting: Cardiovascular Disease

## 2023-05-09 NOTE — Telephone Encounter (Signed)
Also printed 5/13

## 2023-05-09 NOTE — Telephone Encounter (Signed)
Returned call to patient,   Patient states she was in the hospital about 3 weeks ago at behavioral health for alcohol and smoking . She is now sober and has been home for a bit. She states that they have messed all her medications up. She states she is trying to get everything back on track. She states when she was in behavioral health they took her off all her scheduled meds and left her only with the PRN medications for her blood pressure and now it is out of control.   228/166 193/132 181/126  Since she has been home she has been taking ASA 81mg   Bisoprolol 5mg  > 131- 140  Midodrine systolic > 131-140  Losartan >141-159  Doxazosin- 1mg  at bedtime Flecainide 50 BID

## 2023-05-09 NOTE — Telephone Encounter (Signed)
Pt c/o BP issue: STAT if pt c/o blurred vision, one-sided weakness or slurred speech  1. What are your last 5 BP readings? 193/132; 181/126  2. Are you having any other symptoms (ex. Dizziness, headache, blurred vision, passed out)? no  3. What is your BP issue? Right high

## 2023-05-10 ENCOUNTER — Telehealth (HOSPITAL_COMMUNITY): Payer: Self-pay | Admitting: Licensed Clinical Social Worker

## 2023-05-10 MED ORDER — MIDODRINE HCL 2.5 MG PO TABS: 2.5000 mg | ORAL_TABLET | Freq: Two times a day (BID) | ORAL | 3 refills | Status: DC

## 2023-05-10 NOTE — Telephone Encounter (Signed)
The therapist receive a voicemail from Brittney Cooper saying that her blood pressure was high yesterday and today so her Cardiologist told her to stay in bed and rest and to come see her on Friday. Thus, she says that she has to cancel today's appointment with this therapist but notes that she will call to reschedule.  The therapist is unable to locate an appointment with her on the schedule so it is possible that the return appointment somehow never got scheduled.   Myrna Blazer, MA, LCSW, [facility], LCAS 05/10/2023

## 2023-05-10 NOTE — Telephone Encounter (Addendum)
"  Please instruct her to take her medications as we had them at her last appointment.  She should take both the bisoprolol and losartan for those blood pressures.   If SBP <130: midodrine 2.5mg  bid. If SBP 130-140: Midodrine 2.5mg , bisoprolol. If SBP 140-159: Midodrine, bisoprolol and losartan.  If SBP >160: bisoprolol and losartan.  Midodrine reordered and sent to pharmacy on file, recommendations sent to patient via mychart message

## 2023-05-10 NOTE — Addendum Note (Signed)
Addended by: Marlene Lard on: 05/10/2023 11:22 AM   Modules accepted: Orders

## 2023-05-10 NOTE — Telephone Encounter (Signed)
Called patient, advised her to check her mychart for new dosing instructions and that Rx has been sent.  Patient verbalizes understanding.

## 2023-05-11 ENCOUNTER — Telehealth: Payer: Self-pay | Admitting: Cardiovascular Disease

## 2023-05-11 NOTE — Telephone Encounter (Signed)
Patient was returning phone call about heart monitor

## 2023-05-11 NOTE — Telephone Encounter (Signed)
Called pt back in regards to Adderall.  Pt reports May Coleman, NP with [facility] needs MD approval for continuation of Adderall.  Med was stopped and pt feels can not function without medication.    Advised pt I will send message to MD and nurse to f/u.

## 2023-05-11 NOTE — Telephone Encounter (Signed)
Please disregard

## 2023-05-11 NOTE — Telephone Encounter (Signed)
Pt c/o medication issue:  1. Name of Medication: Amphetamine-Dextroamphetamine (ADDERALL PO   2. How are you currently taking this medication (dosage and times per day)? Discontinued   3. Are you having a reaction (difficulty breathing--STAT)? No   4. What is your medication issue? Patient says that she has been taking it for years and would like another prescription to be sent in. Please return call to patient regarding medication

## 2023-05-12 NOTE — Progress Notes (Signed)
Remote pacemaker transmission.   

## 2023-05-12 NOTE — Telephone Encounter (Signed)
Chilton Si, MD  to Macie Burows, RN  Me     05/12/23  7:58 AM I never prescribe Adderall.  This is not a cardiac medication.    Midodrine should only be used if her SBP is <130.  In one of the messages to her it says midodrine if SBP is >131/40.  Based on her recent BP she should not be on midodrine at all!  TCR  Discussed with Dr Duke Salvia and ok to resume Adderall, has been on for years. Also, the Midodrine to just be used if SBP less than 130  Advised patient, verbalized understanding  Continue to monitor and call if any blood pressure issues

## 2023-05-17 ENCOUNTER — Telehealth: Payer: Self-pay

## 2023-05-17 NOTE — Telephone Encounter (Signed)
[employer] remote monitor is not connected. Patient called, advised. States she will plug in.   If someone could recheck to make sure connected.

## 2023-05-19 ENCOUNTER — Ambulatory Visit (INDEPENDENT_AMBULATORY_CARE_PROVIDER_SITE_OTHER): Payer: 59 | Admitting: Licensed Clinical Social Worker

## 2023-05-19 ENCOUNTER — Encounter (HOSPITAL_COMMUNITY): Payer: Self-pay

## 2023-05-19 DIAGNOSIS — F1020 Alcohol dependence, uncomplicated: Secondary | ICD-10-CM | POA: Diagnosis not present

## 2023-05-19 DIAGNOSIS — F314 Bipolar disorder, current episode depressed, severe, without psychotic features: Secondary | ICD-10-CM
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
## 2023-05-19 NOTE — Telephone Encounter (Signed)
I let the patient know her monitor still not connecting.

## 2023-05-26 ENCOUNTER — Ambulatory Visit (INDEPENDENT_AMBULATORY_CARE_PROVIDER_SITE_OTHER): Payer: 59 | Admitting: Licensed Clinical Social Worker

## 2023-05-26 DIAGNOSIS — F1020 Alcohol dependence, uncomplicated: Secondary | ICD-10-CM

## 2023-05-26 DIAGNOSIS — F314 Bipolar disorder, current episode depressed, severe, without psychotic features: Secondary | ICD-10-CM

## 2023-05-26 NOTE — Progress Notes (Signed)
THERAPIST PROGRESS NOTE  Session Time: 1 p.m. to 2 p.m.   Type of Therapy: Individual   Therapist Response/Interventions: CBT and Psychoeducational/The therapist continues to educate her on the problems with people with substance use disorders taking controlled substances. In response to Brittney Cooper's seeming surprise at seeing that she has been diagnosed with an Alcohol Use Disorder, the therapist reviews the diagnostic criteria with her with [facility] admitting that she does meet the criteria.  The therapist discloses his belief that the only person she should be focusing on taking care of is herself and that she needs to work on sleep hygiene, eating regular meals, attending meetings, working with a Marketing executive, Surveyor, quantity Goals addressed:  Active     Substance Use and Bipolar Disorder     Brittney Cooper will abstain completely from drugs and alcohol per self-report and random UDS and breathalyzer (Not Progressing)     Start:  05/19/23    Expected End:  11/19/23         Brittney Cooper will report a decrease in her depression and anxiety as evidenced by having a PHQ-9 and GAD-7 both of 4 or less.  (Not Progressing)     Start:  05/19/23    Expected End:  11/19/23               Summary: Brittney Cooper presents talking about spending the day with her father and goes on a tangent about her fear that her sister is trying to build a case that she is incompetent to have her put somewhere and move into her house.  She admits that Dr. Effie Shy at [facility] prescribed her Adderall with the therapist disclosing that he is strongly opposed to this course of action as noted in her previous session. The therapist looks at [facility] and notes that "Dr. Effie Shy" is not a doctor but a Psychiatric Nurse Practitioner who specializes in ADHD. Ms. Effie Shy also called in Ativan for Brittney Cooper two weeks ago having advised her to switch to this after she runs out of her other benzodiazepine thought Brittney Cooper says that she is not  taking them.  Brittney Cooper talks about the fact that she has always taken care of others in her professional life as a Engineer, civil (consulting) and her personal life recognizing that she has extremely poor self-care and problems with setting limits. She says that she once had a job so stressful that she considered taking two versus one Xanax so she could go into work with her sister pointing out that if she had to take two Xanax to go into work that perhaps she needed to leave the job and find another.  She has not been attending any Twelve Step meetings noting that she needs to do so. The major focus of the session involves the pitfalls of starting controlled substances of any kind given her history and a lack of any sort of testing or evaluation to confirm that she does have ADHD and that her symptoms are not better accounted for by bipolar disorder and substance use.   Progress Towards Goals: Not Progressing  Suicidal/Homicidal: No SI or HI  Plan: Return in 1 week  Diagnosis: Alcohol Use Disorder, Severe and Bipolar I Disorder, Most Recent Episode Depressed  Collaboration of Care: Other N/A  Patient/Guardian was advised Release of Information must be obtained prior to any record release in order to collaborate their care with an outside provider. Patient/Guardian was advised if they have not already done so to contact the registration department to sign  all necessary forms in order for [place] to release information regarding their care.   Consent: Patient/Guardian gives verbal consent for treatment and assignment of benefits for services provided during this visit. Patient/Guardian expressed understanding and agreed to proceed.   Myrna Blazer, MA, LCSW, [facility], LCAS 05/26/2023

## 2023-05-26 NOTE — Telephone Encounter (Signed)
I gave the patient the number to [employer] to order a new monitor.

## 2023-06-02 ENCOUNTER — Ambulatory Visit (HOSPITAL_COMMUNITY): Payer: 59 | Admitting: Licensed Clinical Social Worker

## 2023-06-02 NOTE — Telephone Encounter (Signed)
Pt monitor has connected as of 06/02/2023. 

## 2023-06-06 ENCOUNTER — Ambulatory Visit (INDEPENDENT_AMBULATORY_CARE_PROVIDER_SITE_OTHER): Payer: 59 | Admitting: Orthopedic Surgery

## 2023-06-06 DIAGNOSIS — M86271 Subacute osteomyelitis, right ankle and foot: Secondary | ICD-10-CM

## 2023-06-07 ENCOUNTER — Encounter: Payer: Self-pay | Admitting: Orthopedic Surgery

## 2023-06-07 NOTE — Progress Notes (Signed)
Office Visit Note   Patient: Brittney Cooper           Date of Birth: 1961/05/16           MRN: [id number] Visit Date: 06/06/2023              Requested by: Cheron Schaumann., MD [address] [place],  [place] [address] PCP: Cheron Schaumann., MD  Chief Complaint  Patient presents with   Right Ankle - Follow-up    07/30/2022 partial excision right fib      HPI: Patient is a 62 year old woman who presents in follow-up status post partial excision right fibula for osteomyelitis.  She is currently wearing the Vive wear compression socks.  Patient states she does have swelling and a indented wound.  Assessment & Plan: Visit Diagnoses:  1. Subacute osteomyelitis of right ankle (HCC)     Plan: Recommended continue compression as well as scar massage.  Patient states that she has difficulty with glucose and blood pressure control and she is working on this including diet.  Follow-Up Instructions: No follow-ups on file.   Ortho Exam  Patient is alert, oriented, no adenopathy, well-dressed, normal affect, normal respiratory effort. Examination the incision is well-healed there is no open wound there is no drainage no cellulitis.  There is scar tissue along the incision recommended scar massage there is swelling around the scar and feel the compression scar massage should alleviate this irregularity.  There is no redness or cellulitis.  No tenderness to palpation.  Imaging: No results found. No images are attached to the encounter.  Labs: Lab Results  Component Value Date   HGBA1C 10.7 (H) 04/18/2023   HGBA1C 8.3 (H) 05/24/2022   HGBA1C 9.0 (H) 11/30/2020   ESRSEDRATE 6 07/02/2022   ESRSEDRATE 60 (H) 05/24/2022   CRP 2.5 07/02/2022   CRP 12.3 (H) 05/24/2022   REPTSTATUS 08/04/2022 FINAL 07/30/2022   GRAMSTAIN NO WBC SEEN NO ORGANISMS SEEN  07/30/2022   CULT  07/30/2022    No growth aerobically or anaerobically. Performed at [facility], [address],  [place], [place] [address]    [facility] STAPHYLOCOCCUS AUREUS 05/23/2022     Lab Results  Component Value Date   ALBUMIN 4.3 04/18/2023   ALBUMIN 4.1 04/15/2023   ALBUMIN 3.0 (L) 05/24/2022    Lab Results  Component Value Date   MG 1.8 04/15/2023   MG 1.9 12/01/2020   MG 2.1 02/03/2017   No results found for: "VD25OH"  No results found for: "PREALBUMIN"    Latest Ref Rng & Units 04/15/2023   12:25 PM 07/08/2022    9:30 AM 07/02/2022    2:06 AM  CBC EXTENDED  WBC 4.0 - 10.5 K/uL 4.8   6.7   RBC 3.87 - 5.11 MIL/uL 4.45   4.23   Hemoglobin 12.0 - 15.0 g/dL 82.9  56.2  13.0   HCT 36.0 - 46.0 % 41.8  40.0  40.7   Platelets 150 - 400 K/uL 386   284   NEUT# 1.7 - 7.7 K/uL 1.9     Lymph# 0.7 - 4.0 K/uL 2.4        There is no height or weight on file to calculate BMI.  Orders:  No orders of the defined types were placed in this encounter.  No orders of the defined types were placed in this encounter.    Procedures: No procedures performed  Clinical Data: No additional findings.  ROS:  All  other systems negative, except as noted in the HPI. Review of Systems  Objective: Vital Signs: There were no vitals taken for this visit.  Specialty Comments:  No specialty comments available.  PMFS History: Patient Active Problem List   Diagnosis Date Noted   Severe bipolar I disorder, most recent episode depressed (HCC) 04/17/2023   Alcohol dependence (HCC) 04/17/2023   Neurocardiogenic syncope 09/13/2022   Osteomyelitis of right fibula (HCC) 07/30/2022   Subacute osteomyelitis of right fibula (HCC)    Peripherally inserted central catheter (PICC) in place 06/08/2022   MSSA (methicillin susceptible Staphylococcus aureus) infection 06/08/2022   Acute hematogenous osteomyelitis of right fibula (HCC) 05/25/2022   Infected hardware in right lower extremity (HCC) 05/23/2022   Tobacco abuse 11/30/2020   Hypertension 11/10/2020   Atrial tachycardia 03/12/2020   Dysautonomia (HCC)  01/13/2020   Pacemaker 01/13/2020   Type 2 diabetes mellitus with hyperglycemia, without long-term current use of insulin (HCC) 02/09/2017   MDD (major depressive disorder), recurrent severe, 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   Complete heart block (HCC)    Palpitation 10/11/2016   Acute pancreatitis 09/15/2016   Tobacco use 09/15/2016   Anxiety 09/15/2016   Hypokalemia 09/15/2016   Pancreatitis 07/09/2016   Alcoholic pancreatitis 07/08/2016   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   Left shoulder pain 09/09/2014   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   Allergy    Anxiety    Arthritis    Bipolar disorder (HCC) 2    Cardiac arrest (HCC) 06-2015, 08-2016   x3- pt has pacemaker    Chronic pain    back   Complication of anesthesia    woke up during 2 surgical procedure hydterectomy and colonscopy   Depression    Diabetes mellitus without complication (HCC)    Type 2   Esophageal mass 02/2019   has upper endo with [place] with biopsy   Fatty liver    Fracture of lateral malleolus of right fibula 06/2015   GERD (gastroesophageal reflux disease)    history ofHepatomegaly    Hyperlipidemia    Hypertension    Neurocardiogenic syncope    faints 2 x week   Neurocardiogenic syncope 09/13/2022   Neuromuscular disorder (HCC)    Dystonia   OCD (obsessive compulsive disorder)    OCD (obsessive compulsive disorder)    Osteoporosis    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   Pancreatic pseudocyst    Panic attack    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, working on OB floor as a nurse   Syncope    neurocardiogenic   Third degree heart block (HCC)     Family History  Problem Relation Age of Onset   Stroke Mother    Kidney disease Mother    Colon polyps Mother    Hypertension Mother    Kidney failure Mother    Lung cancer Father        stage  IV    Colon polyps Father    Hypertension Father    Stomach cancer Maternal Grandfather    Colon cancer Paternal Grandfather    Hypertension Sister    Hyperlipidemia Sister    Hypertension Brother    Hyperlipidemia Brother    Rectal cancer Neg Hx    Esophageal cancer Neg Hx    Pancreatic cancer Neg Hx     Past Surgical History:  Procedure  Laterality Date   ABDOMINAL HYSTERECTOMY  2009   ANTERIOR AND POSTERIOR REPAIR  2009   BONE EXCISION Right 07/08/2022   Procedure: PARTIAL FIBULA BONE EXCISION WITH I&D;  Surgeon: Sheral Apley, MD;  Location: [facility];  Service: Orthopedics;  Laterality: Right;   BUNIONECTOMY Bilateral    CESAREAN SECTION     EP IMPLANTABLE DEVICE N/A 10/12/2016   Procedure: Biotronik Pacemaker Implant;  Surgeon: Will Jorja Loa, MD;  Location: [facility] IN[facility];  Service: Cardiovascular;  Laterality: N/A;   finfger surgery Left    left middle fingger   FINGER SURGERY     HARDWARE REMOVAL Right 05/23/2022   Procedure: HARDWARE REMOVAL;  Surgeon: Teryl Lucy, MD;  Location: [facility] OR;  Service: Orthopedics;  Laterality: Right;   I & D EXTREMITY Right 07/30/2022   Procedure: PARTIAL EXCISION RIGHT FIBULA;  Surgeon: Nadara Mustard, MD;  Location: [facility] OR;  Service: Orthopedics;  Laterality: Right;   INCISION AND DRAINAGE ABSCESS Right 05/23/2022   Procedure: INCISION AND DRAINAGE ABSCESS, RIGHT ANKLE;  Surgeon: Teryl Lucy, MD;  Location: [facility] OR;  Service: Orthopedics;  Laterality: Right;   LTCS     MYRINGOTOMY  1966   ORIF ANKLE FRACTURE Right 07/25/2015   Procedure: OPEN REDUCTION INTERNAL FIXATION (ORIF) RIGHT ANKLE ;  Surgeon: Sheral Apley, MD;  Location: [facility];  Service: Orthopedics;  Laterality: Right;   RHINOPLASTY     age 35   sub mandibular cyst removal from right side of tongue  2019   TONSILLECTOMY  1966   and adenoids removed   TUBAL LIGATION  2005   Social History   Occupational History    Occupation: Charity fundraiser  Tobacco Use   Smoking status: Every Day    Packs/day: 1.00    Years: 15.00    Additional pack years: 0.00    Total pack years: 15.00    Types: Cigarettes   Smokeless tobacco: Never   Tobacco comments:    1/2 -1 ppd  Vaping Use   Vaping Use: Former   Substances: Nicotine  Substance and Sexual Activity   Alcohol use: Yes    Comment: daily   Drug use: No   Sexual activity: Not Currently    Birth control/protection: Surgical, Abstinence    Comment: Hysterectomy

## 2023-06-14 ENCOUNTER — Ambulatory Visit (INDEPENDENT_AMBULATORY_CARE_PROVIDER_SITE_OTHER): Payer: 59 | Admitting: Licensed Clinical Social Worker

## 2023-06-14 DIAGNOSIS — F1020 Alcohol dependence, uncomplicated: Secondary | ICD-10-CM

## 2023-06-14 DIAGNOSIS — F314 Bipolar disorder, current episode depressed, severe, without psychotic features: Secondary | ICD-10-CM

## 2023-06-14 NOTE — Progress Notes (Signed)
THERAPIST PROGRESS NOTE  Session Time: 1:07 p.m. to  2 p.m.   Type of Therapy: Individual   Therapist Response/Interventions: CBT/The therapist makes the observation that Shanaya only has specifics in terms of what she did to upset her oldest son with the situation with the other two being unknowns.   As Adryana recognizes that she should not have had contact with her oldest son's ex in light of what she did to him, the therapist models how she can apologize and explain that she will have no such contact going forward.  He validates that Elliyanna should end the conversations if her other son and daughter become belligerent while modeling how to set limits and explain to them that she wants to get the specifics concerning what she has done or not done to cause them to feel this way. The therapist offers to facilitate family sessions with each if they are willing He encourages Wathena to stop listening to her father's input on this situation as he is giving unsolicited advice which has not served her well. The therapist again informs her that it would likely be best to get out of doing direct care for her father and to have another medical professional take over this duty.  The therapist observes that Nagma says that her one son did not learn anything about addiction in spite of going to prison for four DWIs; however, he observes that Raniesha herself is not staying away from controlled substances, not attending meetings, etcetera. The therapist informs her that it is good she is sober from alcohol now; however he is trying to have her engage in behaviors that will guarantee her long-term sobriety as well.   Treatment Goals addressed:  Active     Substance Use and Bipolar Disorder     Livya will abstain completely from drugs and alcohol per self-report and random UDS and breathalyzer (Not Progressing)     Start:  05/19/23    Expected End:  11/19/23         Tenleigh will report a decrease in her depression and  anxiety as evidenced by having a PHQ-9 and GAD-7 both of 4 or less.  (Progressing)     Start:  05/19/23    Expected End:  11/19/23                  Summary: Kierria presents saying that her sister went on-line concluding that Dajuana's going back on her Adderall may not be a good idea. She says that she last drank 59 days ago. She took a Xanax yesterday as she has two refills of Xanax from her NP, Ms. Coleman. She took Adderall. She takes Xanax and Ativan daily. She takes a Xanax if she is going to be in a stressful situation and go to her dad's. Margaree has not gone back to any Twelve Step meetings.  Her PHQ-9 score has dropped from a 14 to a 5 and her GAD-7 has dropped from a 21 down to a 3. When asked what she wants to work on, she says, "I guess my family" and "how to not be the scapegoat." She is not on speaking terms with her two oldest sons. Her oldest son's ex-wife cheated on him four times. He is apparently not talking with Byrd Hesselbach as he heard that she is on speaking terms with his ex-wife with "loyalty" being a "big thing" for him. She says that her other son screams at her saying that she needs to realize what she  has done as he has "abandonment issues" in all of his relationships. Additionally, her oldest daughter says that Shirika "ruined" her life saying that if it were not for Janae's boyfriend that they would  not have had any food, etcetera. Lilyanne says that her relationships with her youngest son and other daughter are good.   Progress Towards Goals: Not Progressing  Suicidal/Homicidal: No SI or HI  Plan: Return in 1 week  Diagnosis: Alcohol Use Disorder, Severe and Bipolar I Disorder, Most Recent Episode Depressed  Collaboration of Care: Other N/A  Patient/Guardian was advised Release of Information must be obtained prior to any record release in order to collaborate their care with an outside provider. Patient/Guardian was advised if they have not already done so to contact the  registration department to sign all necessary forms in order for [place] to release information regarding their care.   Consent: Patient/Guardian gives verbal consent for treatment and assignment of benefits for services provided during this visit. Patient/Guardian expressed understanding and agreed to proceed.   Myrna Blazer, MA, LCSW, [facility], LCAS 06/14/2023

## 2023-06-21 ENCOUNTER — Encounter (HOSPITAL_COMMUNITY): Payer: Self-pay | Admitting: Licensed Clinical Social Worker

## 2023-06-21 ENCOUNTER — Ambulatory Visit (INDEPENDENT_AMBULATORY_CARE_PROVIDER_SITE_OTHER): Payer: 59 | Admitting: Licensed Clinical Social Worker

## 2023-07-07 ENCOUNTER — Ambulatory Visit: Payer: 59

## 2023-07-07 DIAGNOSIS — I442 Atrioventricular block, complete: Secondary | ICD-10-CM | POA: Diagnosis not present

## 2023-07-07 LAB — CUP PACEART REMOTE DEVICE CHECK
Battery Voltage: 50
Date Time Interrogation Session: 20240711091947
Implantable Lead Connection Status: 753985
Implantable Lead Connection Status: 753985
Implantable Lead Implant Date: 20171017
Implantable Lead Implant Date: 20171017
Implantable Lead Location: 753859
Implantable Lead Location: 753860
Implantable Lead Model: 377
Implantable Lead Model: 377
Implantable Lead Serial Number: 49593512
Implantable Lead Serial Number: 49620910
Implantable Pulse Generator Implant Date: 20171017
Pulse Gen Model: 394969
Pulse Gen Serial Number: 68784192

## 2023-07-14 ENCOUNTER — Ambulatory Visit (HOSPITAL_COMMUNITY): Payer: 59 | Admitting: Licensed Clinical Social Worker

## 2023-07-14 ENCOUNTER — Ambulatory Visit (HOSPITAL_BASED_OUTPATIENT_CLINIC_OR_DEPARTMENT_OTHER): Payer: 59 | Admitting: Cardiovascular Disease

## 2023-07-14 NOTE — Progress Notes (Deleted)
Advanced Hypertension Clinic Follow Up:    Date:  07/14/2023   ID:  Brittney Cooper, DOB 15-Oct-1961, MRN [id number]  PCP:  Cheron Schaumann., MD  Cardiologist:  Chilton Si, MD   Referring MD: Cheron Schaumann.,*   CC: Hypertension  History of Present Illness:    Brittney Cooper is a 62 y.o. female with a hx of hypertension and dysautonomia and syncope s/p PPM, pulmonary hypertension, atrial tachycardia, EtOH related pancreatitis here for follow-up. She was initially seen 12/08/2021 in the [facility] Clinic. She has had a couple falls and syncope.  She wore an ambulatory monitor 11/2019 when she had a syncopal event and was noted to be in sinus rhythm.  In 2016 she had an episode of syncope and was found to have intermittent CHB.  This was associated with 6-second pauses.  She had a Biotronik pacemaker implanted.  She also saw Dr. [name] at [facility] and trazodone was discontinued with some improvement in her dizziness and no additional episodes of syncope.  For the prior 3 years she had very labile hypertension.  She was taking midodrine and hydralazine as needed.    Ms. Comes was previously unable to exercise because of shortness of breath.  She tried holding Cymbalta to see if it helped her blood pressure but there was no improvement.  She was continuing to smoke. Hydralazine and midodrine were both held and she was started on bisoprolol and doxazosin.  Her blood pressures improved.  She had lab testing that was negative for both pheochromocytoma and hyperaldosteronism.  Thyroid function was normal.  Renal artery Dopplers were within normal limits 08/2020.  Her blood pressure remained labile but was better.  Losartan was added to her regimen.  She called the office reporting an episode of syncope on 03/2021.  Event monitor at the time revealed sinus arrhythmia with a heart rate in the 100s.  It occurred while walking with her service dog.  She last saw Dr. Graciela Husbands 04/2021 and  reported labile blood pressures ranging from the 90s to the 220s.  He recommended that she use her morning blood pressure to guide her antihypertensive regimen.  He recommended that she not take any medicine if her blood pressure is less than 110 in the morning and treat her BP more aggressively if her BP is >160.   At her last visit she continued to struggle with labile blood pressures and recurrent syncope. One week prior she had a syncopal episode in the bedroom; her blood pressure at the time was 80/56. She had been salt loading without improvement. We added back her midodrine with recommendations for titration of her antihypertensives. She was instructed to check her BP daily and do the following: If SBP <130: midodrine 2.5mg  bid. If SBP 130-140: Midodrine 2.5mg , bisoprolol. If SBP 140-159: Midodrine, bisoprolol and losartan. If SBP >160: bisoprolol and losartan. She was referred to the syncope clinic at [facility] for dysautonomia. She saw Edd Fabian, NP 04/2022 for pre-op clearance and no further testing was needed.  At her appointment 08/2022 she continued to have episodes of neurocardiogenic syncope.  She was seen at the [facility] dysautonomia clinic 05/2023.  They noted that she continues to have syncopal episodes despite pacemaker implantation.  They recommended tilt table testing off medications.  They also recommended that she take bisoprolol every day and to add midodrine if her blood pressure is less than 130.  The increased her fluid intake goal is to 2 to 3 L along  with occasional electrolyte replacement.  They continue to stress importance of compression stockings and an abdominal binder.  She is to take losartan only if her systolic blood pressures over 865.   Previous antihypertensives: Metoprolol- nightmares Hydralazine  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   Allergy    Anxiety     Arthritis    Bipolar disorder (HCC) 2    Cardiac arrest (HCC) 06-2015, 08-2016   x3- pt has pacemaker    Chronic pain    back   Complication of anesthesia    woke up during 2 surgical procedure hydterectomy and colonscopy   Depression    Diabetes mellitus without complication (HCC)    Type 2   Esophageal mass 02/2019   has upper endo with [place] with biopsy   Fatty liver    Fracture of lateral malleolus of right fibula 06/2015   GERD (gastroesophageal reflux disease)    history ofHepatomegaly    Hyperlipidemia    Hypertension    Neurocardiogenic syncope    faints 2 x week   Neurocardiogenic syncope 09/13/2022   Neuromuscular disorder (HCC)    Dystonia   OCD (obsessive compulsive disorder)    OCD (obsessive compulsive disorder)    Osteoporosis    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   Pancreatic pseudocyst    Panic attack    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, working on OB floor as a nurse   Syncope    neurocardiogenic   Third degree heart block (HCC)     Past Surgical History:  Procedure Laterality Date   ABDOMINAL HYSTERECTOMY  2009   ANTERIOR AND POSTERIOR REPAIR  2009   BONE EXCISION Right 07/08/2022   Procedure: PARTIAL FIBULA BONE EXCISION WITH I&D;  Surgeon: Sheral Apley, MD;  Location: [facility];  Service: Orthopedics;  Laterality: Right;   BUNIONECTOMY Bilateral    CESAREAN SECTION     EP IMPLANTABLE DEVICE N/A 10/12/2016   Procedure: Biotronik Pacemaker Implant;  Surgeon: Will Jorja Loa, MD;  Location: [facility] INVASIVE CV LAB;  Service: Cardiovascular;  Laterality: N/A;   finfger surgery Left    left middle fingger   FINGER SURGERY     HARDWARE REMOVAL Right 05/23/2022   Procedure: HARDWARE REMOVAL;  Surgeon: Teryl Lucy, MD;  Location: [facility] OR;  Service: Orthopedics;  Laterality: Right;   I & D EXTREMITY Right 07/30/2022   Procedure: PARTIAL EXCISION RIGHT FIBULA;  Surgeon: Nadara Mustard, MD;  Location:  [facility] OR;  Service: Orthopedics;  Laterality: Right;   INCISION AND DRAINAGE ABSCESS Right 05/23/2022   Procedure: INCISION AND DRAINAGE ABSCESS, RIGHT ANKLE;  Surgeon: Teryl Lucy, MD;  Location: [facility] OR;  Service: Orthopedics;  Laterality: Right;   LTCS     MYRINGOTOMY  1966   ORIF ANKLE FRACTURE Right 07/25/2015   Procedure: OPEN REDUCTION INTERNAL FIXATION (ORIF) RIGHT ANKLE ;  Surgeon: Sheral Apley, MD;  Location: [facility];  Service: Orthopedics;  Laterality: Right;   RHINOPLASTY     age 89   sub mandibular cyst removal from right side of tongue  2019   TONSILLECTOMY  1966   and adenoids removed   TUBAL LIGATION  2005    Current Medications: No outpatient medications have been marked as taking for the 07/14/23 encounter (Appointment) with Chilton Si, MD.  Allergies:   Codeine, Demerol [meperidine], Lopressor [metoprolol], Norco [hydrocodone-acetaminophen], and Jardiance [empagliflozin]   Social History   Socioeconomic History   Marital status: Divorced    Spouse name: Not on file   Number of children: Not on file   Years of education: Not on file   Highest education level: Not on file  Occupational History   Occupation: RN  Tobacco Use   Smoking status: Every Day    Current packs/day: 1.00    Average packs/day: 1 pack/day for 15.0 years (15.0 ttl pk-yrs)    Types: Cigarettes   Smokeless tobacco: Never   Tobacco comments:    1/2 -1 ppd  Vaping Use   Vaping status: Former   Substances: Nicotine  Substance and Sexual Activity   Alcohol use: Yes    Comment: daily   Drug use: No   Sexual activity: Not Currently    Birth control/protection: Surgical, Abstinence    Comment: Hysterectomy  Other Topics Concern   Not on file  Social History Narrative   Not on file   Social Determinants of Health   Financial Resource Strain: High Risk (02/18/2023)   Received from [facility]   Overall Financial Resource Strain (CARDIA)    Difficulty of  Paying Living Expenses: Very hard  Food Insecurity: No Food Insecurity (04/17/2023)   Hunger Vital Sign    Worried About Running Out of Food in the Last Year: Never true    Ran Out of Food in the Last Year: Never true  Recent Concern: Food Insecurity - Food Insecurity Present (02/18/2023)   Received from [facility]   Hunger Vital Sign    Worried About Running Out of Food in the Last Year: Often true    Ran Out of Food in the Last Year: Often true  Transportation Needs: No Transportation Needs (04/17/2023)   PRAPARE - Administrator, Civil Service (Medical): No    Lack of Transportation (Non-Medical): No  Recent Concern: Transportation Needs - Unmet Transportation Needs (02/18/2023)   Received from [facility] - Transportation    Lack of Transportation (Medical): Yes    Lack of Transportation (Non-Medical): Yes  Physical Activity: Unknown (02/18/2023)   Received from [facility]   Exercise Vital Sign    Days of Exercise per Week: 0 days    Minutes of Exercise per Session: Not on file  Stress: Stress Concern Present (02/18/2023)   Received from [facility] - Occupational Stress Questionnaire    Feeling of Stress : Very much  Social Connections: Somewhat Isolated (02/18/2023)   Received from [facility]   Social Network    How would you rate your social network (family, work, friends)?: Restricted participation with some degree of social isolation    Family History: The patient's family history includes Colon cancer in her paternal grandfather; Colon polyps in her father and mother; Hyperlipidemia in her brother and sister; Hypertension in her brother, father, mother, and sister; Kidney disease in her mother; Kidney failure in her mother; Lung cancer in her father; Stomach cancer in her maternal grandfather; Stroke in her mother. There is no history of Rectal cancer, Esophageal cancer, or Pancreatic cancer.  ROS:    Please see the history of present illness.    (+) Syncope All other systems reviewed and are negative.  EKGs/Labs/Other Studies Reviewed:    ABI Doppler  05/24/2022: Summary:  Right: Resting right ankle-brachial index is within normal range. No  evidence of significant right lower extremity arterial disease. The right  toe-brachial index is normal.   Left: Resting left ankle-brachial index is within normal range. No  evidence of significant left lower extremity arterial disease. The left  toe-brachial index is normal.   Bilateral Renal Artery Doppler  09/04/2020: Summary:  Largest Aortic Diameter: 1.9 cm     Renal:     Right: Normal size right kidney. Normal right Resistive Index.         Normal cortical thickness of right kidney. No evidence of         right renal artery stenosis. RRV flow present.  Left:  Normal size of left kidney. Normal left Resistive Index.         Normal cortical thickness of the left kidney. No evidence of         left renal artery stenosis. LRV flow present. Mild tortuosity         of the renal artery.  Mesenteric:  Normal Celiac artery and Superior Mesenteric artery findings.     Patent IVC.   Monitor  01/2020: Indication: syncope   Duration: 30d   Findings Recurrently SYncope assoc with sinus rhythm   Records of HR 180's is an error  Symptoms of Dizziness, LH, FLutters all assoc with sinus rhythm, sometimes regular and sometimes tachy   There was much Vpacing erroneously reported as VT, often assoc with concomitant Apacing, progressive AR intervals and then follow by pacing at various rates, presumably determined by the CLS algorithm   No significant arrhythmia noted    Echo  01/23/2020:  1. Left ventricular ejection fraction, by visual estimation, is 60 to  65%. The left ventricle has normal function. There is no left ventricular  hypertrophy.   2. The left ventricle has no regional wall motion abnormalities.   3. Global right ventricle  has normal systolic function.The right  ventricular size is normal. No increase in right ventricular wall  thickness.   4. Left atrial size was normal.   5. Right atrial size was normal.   6. The mitral valve is normal in structure. Trivial mitral valve  regurgitation. No evidence of mitral stenosis.   7. The tricuspid valve is normal in structure.   8. The tricuspid valve is normal in structure. Tricuspid valve  regurgitation is not demonstrated.   9. The aortic valve is normal in structure. Aortic valve regurgitation is  not visualized. No evidence of aortic valve sclerosis or stenosis.  10. The pulmonic valve was normal in structure. Pulmonic valve  regurgitation is not visualized.  11. Mildly elevated pulmonary artery systolic pressure.  12. The inferior vena cava is normal in size with greater than 50%  respiratory variability, suggesting right atrial pressure of 3 mmHg.    EKG:  EKG is personally reviewed. 09/13/2022:  Atrial paced. Rate 84 bpm. 12/08/21: Sinus arrhytnmia rate 98 bpm.  Atrial pacing.  Nonspecific T wave abnoarmality  Recent Labs: 04/15/2023: Hemoglobin 14.7; Magnesium 1.8; Platelets 386 04/18/2023: ALT 40; BUN 9; Creatinine, Ser 0.65; Potassium 3.4; Sodium 133   Recent Lipid Panel    Component Value Date/Time   CHOL 197 04/18/2023 0633   TRIG 136 04/18/2023 0633   HDL 82 04/18/2023 0633   CHOLHDL 2.4 04/18/2023 0633   VLDL 27 04/18/2023 0633   LDLCALC 88 04/18/2023 0633    Physical Exam:    VS:  There were no vitals taken for this visit.    Wt Readings from Last 3 Encounters:  04/15/23 167 lb (75.8 kg)  02/12/23 167 lb (75.8 kg)  09/13/22 175 lb 12.8 oz (79.7 kg)    GENERAL:  Well appearing HEENT: Pupils equal round and reactive, fundi not visualized, oral mucosa unremarkable NECK:  No jugular venous distention, waveform within normal limits, carotid upstroke brisk and symmetric, no bruits, no thyromegaly LUNGS:  Clear to auscultation  bilaterally HEART:  RRR.  PMI not displaced or sustained,S1 and S2 within normal limits, no S3, no S4, no clicks, no rubs, no murmurs ABD:  Flat, positive bowel sounds normal in frequency in pitch, no bruits, no rebound, no guarding, no midline pulsatile mass, no hepatomegaly, no splenomegaly EXT:  2 plus pulses throughout, no edema, no cyanosis no clubbing SKIN:  No rashes no nodules NEURO:  Cranial nerves II through XII grossly intact, motor grossly intact throughout PSYCH:  Cognitively intact, oriented to person place and time   ASSESSMENT:    No diagnosis found.   PLAN:    No problem-specific Assessment & Plan notes found for this encounter.   Disposition:    FU with MD/PharmD in 6 months.  Medication Adjustments/Labs and Tests Ordered: Current medicines are reviewed at length with the patient today.  Concerns regarding medicines are outlined above.   No orders of the defined types were placed in this encounter.  No orders of the defined types were placed in this encounter.  I,Mathew Stumpf,acting as a Neurosurgeon for Chilton Si, MD.,have documented all relevant documentation on the behalf of Chilton Si, MD,as directed by  Chilton Si, MD while in the presence of Chilton Si, MD.  I,  C. Duke Salvia, MD have reviewed all documentation for this visit.  The documentation of the exam, diagnosis, procedures, and orders on 07/14/2023 are all accurate and complete.   Signed, Chilton Si, MD  07/14/2023 10:31 AM    [facility]

## 2023-07-19 ENCOUNTER — Ambulatory Visit (HOSPITAL_COMMUNITY): Payer: 59 | Admitting: Licensed Clinical Social Worker

## 2023-07-25 NOTE — Progress Notes (Signed)
Remote pacemaker transmission.   

## 2023-08-10 ENCOUNTER — Telehealth: Payer: Self-pay | Admitting: Cardiovascular Disease

## 2023-08-10 ENCOUNTER — Other Ambulatory Visit: Payer: Self-pay | Admitting: Cardiovascular Disease

## 2023-08-10 NOTE — Telephone Encounter (Signed)
Pt c/o medication issue:  1. Name of Medication:   flecainide (TAMBOCOR) 50 MG tablet    2. How are you currently taking this medication (dosage and times per day)? As written   3. Are you having a reaction (difficulty breathing--STAT)? No   4. What is your medication issue? Kelsey with [facility] pharmacy called in to verify the instructions on this pt's medication.

## 2023-08-10 NOTE — Telephone Encounter (Signed)
August 10, 2023  Returned call to [facility] pharmacy.  Confirmed dose of flecainide as 50 mg 2x daily.  Jim Like MHA RN CCM

## 2023-08-11 ENCOUNTER — Other Ambulatory Visit: Payer: Self-pay | Admitting: Cardiovascular Disease

## 2023-08-11 NOTE — Telephone Encounter (Signed)
Please call pt to schedule overdue follow-up appointment with Dr. Duke Salvia or APP for refills. Thank you!

## 2023-08-17 NOTE — Telephone Encounter (Signed)
Left message for patient to call and schedule her over due follow up [facility] appointment with Dr. Duke Salvia / Gillian Shields, NP for medication refills

## 2023-08-19 ENCOUNTER — Encounter (HOSPITAL_BASED_OUTPATIENT_CLINIC_OR_DEPARTMENT_OTHER): Payer: Self-pay

## 2023-08-19 NOTE — Telephone Encounter (Signed)
My Chart message sent requesting patient contact the office

## 2023-08-19 NOTE — Telephone Encounter (Signed)
Left message for patient to call and schedule overdue follow up  with Dr. [place] / APP for medication refills 

## 2023-08-23 ENCOUNTER — Encounter (HOSPITAL_BASED_OUTPATIENT_CLINIC_OR_DEPARTMENT_OTHER): Payer: Self-pay | Admitting: Cardiovascular Disease

## 2023-08-23 NOTE — Telephone Encounter (Signed)
Left message for patient to call and schedule over due follow up for medication refills.  Will mail letter requesting she call the office

## 2023-09-19 ENCOUNTER — Other Ambulatory Visit (HOSPITAL_BASED_OUTPATIENT_CLINIC_OR_DEPARTMENT_OTHER): Payer: Self-pay | Admitting: Cardiovascular Disease

## 2023-10-06 ENCOUNTER — Ambulatory Visit (INDEPENDENT_AMBULATORY_CARE_PROVIDER_SITE_OTHER): Payer: 59

## 2023-10-06 DIAGNOSIS — I442 Atrioventricular block, complete: Secondary | ICD-10-CM | POA: Diagnosis not present

## 2023-10-06 LAB — CUP PACEART REMOTE DEVICE CHECK
Battery Voltage: 45
Date Time Interrogation Session: 20241010070930
Implantable Lead Connection Status: 753985
Implantable Lead Connection Status: 753985
Implantable Lead Implant Date: 20171017
Implantable Lead Implant Date: 20171017
Implantable Lead Location: 753859
Implantable Lead Location: 753860
Implantable Lead Model: 377
Implantable Lead Model: 377
Implantable Lead Serial Number: 49593512
Implantable Lead Serial Number: 49620910
Implantable Pulse Generator Implant Date: 20171017
Pulse Gen Model: 394969
Pulse Gen Serial Number: 68784192

## 2023-10-18 NOTE — Progress Notes (Signed)
Remote pacemaker transmission.   

## 2023-11-07 ENCOUNTER — Telehealth: Payer: Self-pay

## 2023-11-07 NOTE — Telephone Encounter (Signed)
Patient needs routine follow up.  Please schedule and add in notes:  *Adjust sensing filter* SEE PHONE NOTE  We need to adjust sensing filter under diagnostics from filter to unfiltered per [employer] rep.

## 2023-12-01 ENCOUNTER — Ambulatory Visit: Payer: 59 | Attending: Internal Medicine | Admitting: Internal Medicine

## 2023-12-02 ENCOUNTER — Encounter: Payer: Self-pay | Admitting: Internal Medicine

## 2023-12-18 ENCOUNTER — Other Ambulatory Visit (HOSPITAL_BASED_OUTPATIENT_CLINIC_OR_DEPARTMENT_OTHER): Payer: Self-pay | Admitting: Cardiovascular Disease

## 2023-12-30 ENCOUNTER — Ambulatory Visit (HOSPITAL_BASED_OUTPATIENT_CLINIC_OR_DEPARTMENT_OTHER): Payer: 59 | Admitting: Cardiovascular Disease

## 2023-12-30 NOTE — Progress Notes (Deleted)
  Cardiology Office Note:  .   Date:  12/30/2023  ID:  CARLTON SWEANEY, DOB [birth year], MRN [id number] PCP: Andrew Truman GRADE., MD  [facility] Providers Cardiologist:  Annabella Scarce, MD { Click to update primary MD,subspecialty MD or APP then REFRESH:1}   History of Present Illness: Brittney   CONYA Cooper is a 63 y.o. female ***  Discussed the use of AI scribe software for clinical note transcription with the patient, who gave verbal consent to proceed.  History of Present Illness            ROS:  As per HPI  Studies Reviewed: .        *** Risk Assessment/Calculations:   {Does this patient have ATRIAL FIBRILLATION?:[phone]} No BP recorded.  {Refresh Note OR Click here to enter BP  :1}***       Physical Exam:   VS:  There were no vitals taken for this visit. , BMI There is no height or weight on file to calculate BMI. GENERAL:  Well appearing HEENT: Pupils equal round and reactive, fundi not visualized, oral mucosa unremarkable NECK:  No jugular venous distention, waveform within normal limits, carotid upstroke brisk and symmetric, no bruits, no thyromegaly LYMPHATICS:  No cervical adenopathy LUNGS:  Clear to auscultation bilaterally HEART:  RRR.  PMI not displaced or sustained,S1 and S2 within normal limits, no S3, no S4, no clicks, no rubs, *** murmurs ABD:  Flat, positive bowel sounds normal in frequency in pitch, no bruits, no rebound, no guarding, no midline pulsatile mass, no hepatomegaly, no splenomegaly EXT:  2 plus pulses throughout, no edema, no cyanosis no clubbing SKIN:  No rashes no nodules NEURO:  Cranial nerves II through XII grossly intact, motor grossly intact throughout PSYCH:  Cognitively intact, oriented to person place and time   ASSESSMENT AND PLAN: .   *** Assessment and Plan                 {Are you ordering a CV Procedure (e.g. stress test, cath, DCCV, TEE, etc)?   Press F2        :[phone]}  Dispo: ***  Signed, Annabella Scarce, MD

## 2024-01-05 ENCOUNTER — Ambulatory Visit (INDEPENDENT_AMBULATORY_CARE_PROVIDER_SITE_OTHER): Payer: 59

## 2024-01-05 DIAGNOSIS — I442 Atrioventricular block, complete: Secondary | ICD-10-CM | POA: Diagnosis not present

## 2024-01-05 LAB — CUP PACEART REMOTE DEVICE CHECK
Battery Voltage: 45
Date Time Interrogation Session: 20250109074023
Implantable Lead Connection Status: 753985
Implantable Lead Connection Status: 753985
Implantable Lead Implant Date: 20171017
Implantable Lead Implant Date: 20171017
Implantable Lead Location: 753859
Implantable Lead Location: 753860
Implantable Lead Model: 377
Implantable Lead Model: 377
Implantable Lead Serial Number: 49593512
Implantable Lead Serial Number: 49620910
Implantable Pulse Generator Implant Date: 20171017
Pulse Gen Model: 394969
Pulse Gen Serial Number: 68784192

## 2024-01-05 NOTE — Progress Notes (Signed)
 Subjective:     Patient ID:  Brittney Cooper is a 63 y.o. female. Patient comes in for Annual Exam .  HPI Brittney Cooper is here today for CPE and management of DM2 HTN hyperlipidemia RLS, hx of heart block with pacemaker, depression anxiety bipolar disorder and ADHD and hx of alcohol  abuse episodic (managed by psych). She reports last year she had infected hardware in the R ankle that required 3 surgeries. She is now recovered and is able to walk fine without assistance. She sees cardiology endocrinology and psych on a regular basis. Most recent hba1c was 7.4 by endocrinology. She is on insulin  and oral meds. Mammogram - due 01/2022 Colonoscopy - due 07/2027 DM foot exam - due 04/2022 Td - due 07/2028  The following portions of thepatient's history were reviewed and updated as appropriate: She  has a past medical history of ADHD, Anxiety, Bipolar disorder (CMD), Depression, Diabetes mellitus (CMD), Dysautonomia (CMD), Environmental allergies, Heart block, History of osteomyelitis, Hypercholesterolemia, Hypertension, Pacemaker, Pulmonary hypertension (CMD), Substance abuse (CMD), and Type 2 diabetes mellitus (CMD).   She does not have any pertinent problems on file. She  has a past surgical history that includes Colonoscopy; Tonsillectomy; Tympanostomy; Rhinoplasty; Cesarean section, unspecified; Tubal ligation; Hysterectomy; Colporrhaphy; Bunionectomy (Bilateral); ORIF ankle fracture (Right); Finger surgery; Submandibular gland excision (Right, 12/06/2017); Cardiac pacemaker placement; and Ankle hardware removal (Right, 04/2022). Her family history includes Dementia in her mother. She  reports that she has been smoking cigarettes. She has been exposed to tobacco smoke. She has never used smokeless tobacco. She reports current alcohol  use. She reports that she does not use drugs. She has a current medication list which includes the following prescription(s): alprazolam , aripiprazole , aspirin , bisoprolol ,  blood-glucose meter, freestyle libre 3 sensor, bupropion , calcium  carbonate/vitamin d2, dextroamphetamine -amphetamine , doxazosin , flecainide , folic acid , gabapentin , glipizide , gvoke, accu-chek guide test strips, accu-chek guide test strips, humalog kwikpen insulin , insulin  degludec, lancets, losartan , metformin , midodrine , pantoprazole , pen needle, diabetic, quetiapine , ropinirole , rosuvastatin , and vitamin d2. Current Outpatient Medications on File Prior to Visit  Medication Sig Dispense Refill  . ALPRAZolam  (XANAX ) 0.5 mg tablet Take  by mouth 4 (four) times a day as needed.    . ARIPiprazole  (ABILIFY ) 10 mg tablet Take 10 mg by mouth daily.    . aspirin  81 mg EC tablet Take 81 mg by mouth.    . bisoprolol  (ZEBETA ) 5 mg tablet Take 1 tablet by mouth daily.    . blood-glucose meter misc Please provide accu chek guide glucometer for twice daily glucose monitoring. E11.65 OK to substitute another brand if preferred by insurance. 1 each 0  . blood-glucose sensor (FreeStyle Libre 3 Sensor) devi Apply 1 sensor to the skin every 14 days for continuous blood glucose monitoring 2 each 3  . buPROPion  (WELLBUTRIN  SR) 100 mg 12 hr tablet     . CALCIUM  CARBONATE-VITAMIN D2 ORAL Take by mouth.    . dextroamphetamine -amphetamine  (ADDERALL XR) 20 mg 24 hr capsule     . doxazosin  (CARDURA ) 1 mg tablet     . flecainide  (TAMBOCOR ) 50 mg tablet Take 50 mg by mouth 2 (two) times a day.    . folic acid  (FOLVITE ) 1 mg tablet TAKE 1 TABLET BY MOUTH DAILY 30 tablet 5  . gabapentin  (NEURONTIN ) 300 mg capsule Take 2 capsules qHS 180 capsule 0  . glipiZIDE  (GLUCOTROL ) 10 mg tablet Take 1 tablet (10 mg total) by mouth daily with breakfast. 90 tablet 3  . glucagon (Gvoke) 1 mg/0.2 mL soln Inject 1  mg under the skin as needed. 0.2 mL 2  . glucose blood (Accu-Chek Guide test strips) test strip USE TO MONITOR BLOOD GLUCOSE 1-2 TIMES DAILY WITH ACCUCHEK GUIDE METER OR METER PREFERRED BY INSURANCE 100 strip 3  . glucose blood  (Accu-Chek Guide test strips) test strip     . HumaLOG KwikPen Insulin  100 unit/mL KwikPen Use as directed per sliding scale.  MDD: 50 units/day 15 mL 5  . insulin  degludec Lelon FlexTouch U-100) 100 unit/mL (3 mL) pen Inject 16 units subcutaneously every day as needed to max daily dose of 50 units per day 15 mL 11  . Lancets (Accu-Chek Softclix Lancets) misc Use as needed to check BG 3 times/day.  Dx E11.65 100 each 11  . losartan  (COZAAR ) 25 mg tablet Take 25 mg by mouth daily.    . metFORMIN  (GLUCOPHAGE ) 1,000 mg tablet Take 1 tablet by mouth twice daily with food 60 tablet 6  . midodrine  (PROAMATINE ) 2.5 mg tablet Take 2.5 mg by mouth. Take 2.5 mg by mouth as needed (for SBP less than 130).    . pantoprazole  (PROTONIX ) 40 mg EC tablet TAKE 1 TABLET BY MOUTH TWICE DAILY BEFORE MEALS 180 tablet 3  . pen needle, diabetic (BD Ultra-Fine Nano Pen Needle) 32 gauge x 5/32 ndle Use 3 times a day as needed 100 each 11  . QUEtiapine  (SEROquel ) 300 mg tablet at bedtime.    . rOPINIRole  (REQUIP ) 0.5 mg tablet Take 1 tablet by mouth at bedtime 30 tablet 11  . rosuvastatin  (CRESTOR ) 20 mg tablet Take 1 tablet by mouth every day 30 tablet 11  . Vitamin D2 1,250 mcg (50,000 unit) capsule Take 1 capsule by mouth once a week 4 capsule 11  . [DISCONTINUED] amoxicillin (AMOXIL) 500 mg capsule Take 500 mg by mouth 3 (three) times a day. (Patient not taking: Reported on 01/05/2024)    . [DISCONTINUED] co-enzyme Q-10 50 mg cap Take 30 mg by mouth. (Patient not taking: Reported on 01/05/2024)    . [DISCONTINUED] multivitamin with minerals tab Take 1 tablet by mouth daily. (Patient not taking: Reported on 01/05/2024)    . [DISCONTINUED] predniSONE (STERAPRED 12 DAY) 5 mg (48 tabs) DsPk 12 day dose pack Take As Directed On Package. (Patient not taking: Reported on 01/05/2024) 48 tablet 0   No current facility-administered medications on file prior to visit.   She is allergic to codeine, hydrocodone -acetaminophen ,  meperidine, metoprolol , and empagliflozin.. Patient Active Problem List  Diagnosis  . History of alcohol  abuse  . Anxiety  . Attention deficit disorder of adult with hyperactivity  . Bipolar disorder (HCC)  . Complete heart block (HCC)  . Degeneration of lumbar or lumbosacral intervertebral disc  . Essential hypertension  . Hepatic steatosis  . Hyperlipidemia associated with type 2 diabetes mellitus (HCC)  . Lymphadenopathy, inguinal  . MDD (major depressive disorder), recurrent severe, without psychosis (HCC)  . Pacemaker  . Palpitations  . Pancreatic pseudocyst  . Panic attacks  . Tobacco use disorder  . Type 2 diabetes mellitus with diabetic polyneuropathy, with long-term current use of insulin  (CMD)  . Chronic cough  . Esophageal mass  . Closed displaced fracture of proximal phalanx of right great toe  . Osteopenia of multiple sites  . RLS (restless legs syndrome)  . Left-sided Bell's palsy  . Atrial tachycardia  . Vitamin D deficiency  . Subacute osteomyelitis of right ankle (CMS/HCC) -followed by ortho at [facility]  . Long-term insulin  use (CMD)  Review of Systems:  Complete Review of Systems negative except as stated in HPI or elsewhere in this document.  Objective:    Pertinent Labs Reviewed  Lab Results  Component Value Date   WBC 5.2 12/15/2022   HGB 13.5 12/15/2022   HCT 39.2 12/15/2022   PLT 221 12/15/2022   CHOL 105 06/15/2023   TRIG 97 06/15/2023   HDL 51 (L) 06/15/2023   LDLDIRECT 105 08/09/2022   ALT 25 06/15/2023   AST 28 06/15/2023   NA 138 12/15/2022   K 3.9 12/15/2022   CL 100 12/15/2022   CREATININE 0.71 12/15/2022   BUN 14 12/15/2022   CO2 28 12/15/2022   HGBA1C 7.4 (A) 08/09/2022   Lab Results  Component Value Date   LDLCALC 36 06/15/2023   Endoscopy Report Historical This result was converted from [facility] - To view the full report, under Linked Documents click View Image  Physical Exam  Vitals:   01/05/24  1321  BP: 116/84  Pulse: 101  Resp: 16  Temp: 96.9 F (36.1 C)  SpO2: 97%    BP 116/84 (BP Location: Left arm, Patient Position: Sitting)   Pulse 101   Temp 96.9 F (36.1 C) (Temporal)   Resp 16   Ht 1.626 m (5' 4)   Wt 76.2 kg (168 lb)   SpO2 97%   BMI 28.84 kg/m   General Appearance:    Alert, cooperative, no distress, appears stated age  Head:    Normocephalic, without obvious abnormality, atraumatic  Eyes:    PERRL, conjunctiva/corneas clear, EOM's intact  Ears:    Normal TM's and external ear canals, both ears  Nose:   Nares normal, septum midline, mucosa normal, no drainage    or sinus tenderness  Throat:   Lips, mucosa, and tongue normal; teeth and gums normal  Neck:   Supple, symmetrical, trachea midline, no adenopathy;    thyroid :  no enlargement/tenderness/nodules; no carotid   bruit or JVD  Back:     Symmetric, no curvature, ROM normal, no CVA tenderness  Lungs:     Clear to auscultation bilaterally, respirations unlabored  Chest Wall:    No tenderness or deformity   Heart:    Regular rate and rhythm, S1 and S2 normal, no murmur, rub   or gallop  Breast Exam:    No tenderness, masses, or nipple abnormality  Abdomen:     Soft, non-tender, bowel sounds active all four quadrants,    no masses, no organomegaly  Genitalia:    defer  Pelvic:    defer  Extremities:   Extremities normal, atraumatic, no cyanosis or edema  Pulses:   2+ and symmetric all extremities  Skin:   Skin color, texture, turgor normal, no rashes or lesions  Lymph nodes:   Cervical, supraclavicular, and axillary nodes normal  Neurologic:   CNII-XII intact, normal strength, sensation and reflexes    throughout    Diabetic Foot Exam Performed: Yes Diabetic Foot Exam Result:  []  Normal The patient's foot has been visually inspected with no ulcers, deformities, or abnormal toenails.  The patient can see the bottom of his/her feet and shoes fit properly. The patient can feel the 10g nylon filament  on both feet for the 1st, 3rd, 5th metatarsals and big toes.  The patient's pedal pulses are palpable on both feet at dorsalis pedis and/or posterior tibial.  (If normal selected no need to complete below this line.) []  Abnormal  [x]  Abnormal with neuropathy []   Bilateral amputation lower extremities   Foot Exam  Yes  No  Comment  Is there a foot ulcer now?  []  [x]    Is there a history of foot ulcers? [x]  [x]    Is there toe deformity? []  [x]    Is there abnormal shape?  []  [x]    Are toenails thick or ingrown? []  [x]    Can patient see bottom of feet? []  [x]    Are patients shoes improperly fitting?  []  [x]                                                                                                                                                                                      Right Foot  Normal Abnormal Comment  Monofilament Left Foot Normal Abnormal Comment  5th Metatarsal [x]  []     5th Metatarsal [x]  []    3rd Metatarsal [x]  []   She has reduced sensation in th ball of both feet, no ulcers  3rd Metatarsal [x]  []    1st Metatarsal [x]  []    1st Metatarsal [x]  []    Big Toe [x]  []    Big Toe [x]  []     Pulses Assessment/Exam Palpable  Doppler Absent N/A - amputation   Comment  Dorsalis pedis - R [x]  []  []  []    Dorsalis pedis - L [x]  []  []  []    Posterior tibial - R [x]  []  []  []    Posterior tibial - L [x]  []  []  []      Assessment:         see below   Plan:      Plan: 1. Essential hypertension (Primary) Stable and at goal on current meds  - Comprehensive Metabolic Panel; Future - Urinalysis with Reflex to Microscopic; Future - Urine Container Hold; Future - TSH; Future - T4, Free; Future - Albumin, Random Urine; Future - Comprehensive Metabolic Panel - Urinalysis with Reflex to Microscopic - Urine Container Hold - TSH - T4, Free - Albumin, Random Urine  2. Hyperlipidemia associated with type 2 diabetes mellitus (HCC) Stable on statin  - Lipid Panel With Reflex Direct  LDL; Future - Ambulatory referral to Diabetes Education and Medical Nutrition Therapy; Future - Lipid Panel With Reflex Direct LDL  3. MDD (major depressive disorder), recurrent severe, without psychosis (HCC) Stable per psych   4. Pacemaker Stable per cards   5. Palpitations Stable per cards   6. Panic attacks Stable per psych Pt on SSRI and benzos and Abilify    7. Type 2 diabetes mellitus with diabetic polyneuropathy, with long-term current use of insulin  (CMD) Stable per endocrinology on insulin  Pt wants a refresher on carb counting to be able to use SSI I will refer to educator  -  Ambulatory referral to Diabetes Education and Medical Nutrition Therapy; Future  8. Osteopenia of multiple sites Continue calcium  +D and stay active   9. Vitamin D deficiency Continue supplement and monitor  - Vitamin D, 25-Hydroxy; Future - Vitamin D, 25-Hydroxy  10. RLS (restless legs syndrome) Stale on requip    11. Complete heart block (HCC) Stable with pacemaker and defibrillator implanted per cards   12. Bipolar affective disorder, remission status unspecified (CMD) Stable per psyc h  13. Attention deficit disorder of adult with hyperactivity Stable per psych   14. Encounter for routine history and physical examination Tdap and flu vaccines given today Discussed Shingrix and pt will call later for NV  - MG Breast Screening Tomo Bilat; Future - CBC with Differential; Future - CBC with Differential  16. Long-term insulin  use (CMD) Per endocrinology  - Ambulatory referral to Diabetes Education and Medical Nutrition Therapy; Future  17. Encounter for immunization  - Flu,Trivalent,IM, Preservative Free  The [place]  Controlled Substances Reporting System has been reviewed today as part of our clinic protocol. Patient expresses understanding of their current medications and use.  If a new prescription was given today, then I discussed potential side effects, drug  interactions, instructions for taking the medication, and the consequences of not taking it.   Patient verbalized an understanding of these instructions. Patient is able to verbalize understanding of the care plan discussed today. Patient's medical and personal goals were discussed today.to current goals:  None The following portions of the patient's history were reviewed and updated as appropriate: allergies, current medications, past family history, past medical history, past social history,past surgical history and problem list. Return in about 6 months (around 07/04/2024) for f/u, AWV, DM2, HTN, hyperlipidemia, Depression/Mood, low vit D.   Call sooner if needed.  This document serves as a record of services personally performed by Truman CINDERELLA Romney, MD .  It was created on their behalf by Truman CINDERELLA Romney, MD, a trained medical scribe. During the course of documenting the history, physical exam and medical decision making, I was functioning as a stage manager. The creation of this record is the provider's dictation and/or activities during the visit.  Electronically signed by Truman CINDERELLA Romney, MD 01/05/2024 8:06 AM       I agree the documentation is accurate and complete.  Electronically signed by: Truman CINDERELLA Romney, MD 01/05/2024 1:53 PM   Make me the dino

## 2024-01-11 ENCOUNTER — Telehealth: Payer: Self-pay

## 2024-01-11 NOTE — Telephone Encounter (Signed)
 Patient is requesting a transfer of care from [facility]. Patient stated her father was seen with Dr. Nandigam last week and was very impressed with her work. Requested records for review.

## 2024-02-14 NOTE — Progress Notes (Signed)
 Remote pacemaker transmission.

## 2024-02-20 ENCOUNTER — Encounter: Payer: Self-pay | Admitting: Internal Medicine

## 2024-02-20 ENCOUNTER — Ambulatory Visit: Payer: 59 | Attending: Internal Medicine | Admitting: Internal Medicine

## 2024-02-20 VITALS — BP 130/95 | HR 87 | Ht 64.0 in | Wt 169.4 lb

## 2024-02-20 DIAGNOSIS — I442 Atrioventricular block, complete: Secondary | ICD-10-CM | POA: Diagnosis not present

## 2024-02-20 DIAGNOSIS — Z950 Presence of cardiac pacemaker: Secondary | ICD-10-CM

## 2024-02-20 DIAGNOSIS — G901 Familial dysautonomia [Riley-Day]: Secondary | ICD-10-CM | POA: Diagnosis not present

## 2024-02-20 DIAGNOSIS — R079 Chest pain, unspecified: Secondary | ICD-10-CM

## 2024-02-20 DIAGNOSIS — I10 Essential (primary) hypertension: Secondary | ICD-10-CM

## 2024-02-20 DIAGNOSIS — R002 Palpitations: Secondary | ICD-10-CM | POA: Diagnosis not present

## 2024-02-20 DIAGNOSIS — I4719 Other supraventricular tachycardia: Secondary | ICD-10-CM

## 2024-02-20 NOTE — Progress Notes (Unsigned)
 Patient Care Team: Cheron Schaumann., MD as PCP - General (Internal Medicine) Chilton Si, MD as PCP - Cardiology (Cardiology)   HPI  Brittney Cooper is a 63 y.o. female Seen in followup for dysautonomic syncope.She had recurrent syncope with a monitor on.This was associated with a 6 second pause 2 . She had head trauma requiring laceration.  Palpitations 2/2 atrial tachycardia on flecainide  She underwent pacing with a Biotronik CLS.  She has continued however to have recurrent syncope, these all associated with normal rhythm.    Other contributing complex medical comorbidities outlined in the old notes. 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  Ended up with antibiotics and subsequently a surgical excision of her distal fibula  Still with surgery.  Now better.  Being followed now also with [facility].  Tweaking the medicines and feeling better.   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   Allergy    Anxiety    Arthritis    Bipolar disorder (HCC) 2    Cardiac arrest (HCC) 06-2015, 08-2016   x3- pt has pacemaker    Chronic pain    back   Complication of anesthesia    woke up during 2 surgical procedure hydterectomy and colonscopy   Depression    Diabetes mellitus without complication (HCC)    Type 2   Esophageal mass 02/2019   has upper endo with [place] with biopsy   Fatty liver    Fracture of lateral malleolus of right fibula 06/2015   GERD (gastroesophageal reflux disease)    history ofHepatomegaly    Hyperlipidemia    Hypertension     Neurocardiogenic syncope    faints 2 x week   Neurocardiogenic syncope 09/13/2022   Neuromuscular disorder (HCC)    Dystonia   OCD (obsessive compulsive disorder)    OCD (obsessive compulsive disorder)    Osteoporosis    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   Pancreatic pseudocyst    Panic attack    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, working on OB floor as a nurse   Syncope    neurocardiogenic   Third degree heart block (HCC)     Past Surgical History:  Procedure Laterality Date   ABDOMINAL HYSTERECTOMY  2009   ANTERIOR AND POSTERIOR REPAIR  2009   BONE EXCISION Right 07/08/2022   Procedure: PARTIAL FIBULA BONE EXCISION WITH I&D;  Surgeon: Sheral Apley, MD;  Location: [facility];  Service: Orthopedics;  Laterality: Right;   BUNIONECTOMY Bilateral    CESAREAN SECTION     EP IMPLANTABLE DEVICE N/A 10/12/2016   Procedure: Biotronik Pacemaker Implant;  Surgeon: Will Jorja Loa, MD;  Location: [facility];  Service: Cardiovascular;  Laterality: N/A;   finfger surgery Left    left middle fingger   FINGER SURGERY     HARDWARE REMOVAL Right 05/23/2022  Procedure: HARDWARE REMOVAL;  Surgeon: Teryl Lucy, MD;  Location[facility];  Service: Orthopedics;  Laterality: Right;   I & D EXTREMITY Right 07/30/2022   Procedure: PARTIAL EXCISION RIGHT FIBULA;  Surgeon: Nadara Mustard, MD;  Location: [facility] OR;  Service: Orthopedics;  Laterality: Right;   INCISION AND DRAINAGE ABSCESS Right 05/23/2022   Procedure: INCISION AND DRAINAGE ABSCESS, RIGHT ANKLE;  Surgeon: Teryl Lucy, MD;  Location: [facility] OR;  Service: Orthopedics;  Laterality: Right;   LTCS     MYRINGOTOMY  1966   ORIF ANKLE FRACTURE Right 07/25/2015   Procedure: OPEN REDUCTION INTERNAL FIXATION (ORIF) RIGHT ANKLE ;  Surgeon: Sheral Apley, MD;  Location: [facility];  Service: Orthopedics;  Laterality: Right;   RHINOPLASTY     age 82   sub mandibular  cyst removal from right side of tongue  2019   TONSILLECTOMY  1966   and adenoids removed   TUBAL LIGATION  2005    Current Outpatient Medications  Medication Sig Dispense Refill   ARIPiprazole (ABILIFY) 10 MG tablet Take 1 tablet (10 mg total) by mouth daily. 60 tablet 0   atomoxetine (STRATTERA) 40 MG capsule Take 1 capsule (40 mg total) by mouth daily. 30 capsule 0   bisoprolol (ZEBETA) 5 MG tablet TAKE 1 TABLET BY MOUTH DAILY 90 tablet 2   doxazosin (CARDURA) 1 MG tablet TAKE 1 TABLET BY MOUTH AT BEDTIME (Patient taking differently: Take 1 mg by mouth daily as needed.) 90 tablet 3   flecainide (TAMBOCOR) 50 MG tablet Take 1 tablet (50 mg total) by mouth 2 (two) times daily. Please call [phone] to schedule overdue appointment for further refills. 60 tablet 11   gabapentin (NEURONTIN) 300 MG capsule Take 1 capsule (300 mg total) by mouth 2 (two) times daily. 60 capsule 0   glipiZIDE (GLUCOTROL) 10 MG tablet Take 1 tablet (10 mg total) by mouth daily before breakfast. 30 tablet 0   losartan (COZAAR) 25 MG tablet Take 1 tablet by mouth every day 30 tablet 11   metFORMIN (GLUCOPHAGE) 1000 MG tablet Take 1,000 mg by mouth 2 (two) times daily.     midodrine (PROAMATINE) 2.5 MG tablet Take 2.5 mg by mouth as needed (for SBP less than 130).     rOPINIRole (REQUIP) 1 MG tablet Take 1 tablet (1 mg total) by mouth at bedtime. 30 tablet 0   traZODone (DESYREL) 100 MG tablet Take 1 tablet (100 mg total) by mouth at bedtime as needed for sleep. 30 tablet 0   No current facility-administered medications for this visit.    Allergies  Allergen Reactions   Codeine Nausea And Vomiting    Pt stated she also thought it made her hallucinate   Demerol [Meperidine] Other (See Comments)    Hallucinations    Lopressor [Metoprolol] Other (See Comments)    Nightmares    Norco [Hydrocodone-Acetaminophen] Nausea And Vomiting    Pt reports Vicodin caused "vomiting for six hours," reports being able to  tolerate oxycodone and tramadol. Can take tylenol   Jardiance [Empagliflozin] Other (See Comments)    Yeast infections      Review of Systems negative except from HPI and PMH  Physical Exam BP (!) 130/95 Comment: standing @ 3 mins  Pulse 87   Ht 5\' 4"  (1.626 m)   Wt 169 lb 6.4 oz (76.8 kg)   SpO2 98%   BMI 29.08 kg/m  Well developed and well nourished in no acute distress HENT normal Neck  supple with JVP-flat Clear Device pocket well healed; without hematoma or erythema.  There is no tethering  Regular rate and rhythm, no right again gallop No / murmur Abd-soft with active BS No Clubbing cyanosis  edema Skin-warm and dry A & Oriented  Grossly normal sensory and motor function  ECG a pacing @ 90 23/09/33 Nonspecific T wave  Device function is normal. Programming changes none  See Paceart for details    Assessment and  Plan  Syncope neurally mediated/Dysautonomia  Complete Heart Block intermittent  Pulm hypertension   Pacemaker  -- Biotronik       Hypertension-labile  Orthostatic hypertension  Atrial tachycardia   Doing relatively well  Maintain aggressive CLS pacing   Now followed at [facility] Will defere followup with her

## 2024-02-20 NOTE — Patient Instructions (Signed)

## 2024-02-22 ENCOUNTER — Encounter: Payer: Self-pay | Admitting: Internal Medicine

## 2024-02-22 LAB — CUP PACEART INCLINIC DEVICE CHECK
Date Time Interrogation Session: 20250224082253
Implantable Lead Connection Status: 753985
Implantable Lead Connection Status: 753985
Implantable Lead Implant Date: 20171017
Implantable Lead Implant Date: 20171017
Implantable Lead Location: 753859
Implantable Lead Location: 753860
Implantable Lead Model: 377
Implantable Lead Model: 377
Implantable Lead Serial Number: 49593512
Implantable Lead Serial Number: 49620910
Implantable Pulse Generator Implant Date: 20171017
Pulse Gen Model: 394969
Pulse Gen Serial Number: 68784192

## 2024-03-14 NOTE — Telephone Encounter (Signed)
 Lets do exercise myoview  will avoid CTA given intracardiac leads.

## 2024-03-15 ENCOUNTER — Telehealth (HOSPITAL_COMMUNITY): Payer: Self-pay | Admitting: *Deleted

## 2024-03-15 ENCOUNTER — Encounter (HOSPITAL_COMMUNITY): Payer: Self-pay

## 2024-03-15 NOTE — Telephone Encounter (Signed)
 Patient given detailed instructions per Myocardial Perfusion Study Information Sheet for the test on 03/16/2024 at 10:30. Patient notified to arrive 15 minutes early and that it is imperative to arrive on time for appointment to keep from having the test rescheduled.  If you need to cancel or reschedule your appointment, please call the office within 24 hours of your appointment. . Patient verbalized understanding.Brittney Cooper

## 2024-03-16 ENCOUNTER — Ambulatory Visit (HOSPITAL_COMMUNITY): Attending: Internal Medicine

## 2024-03-16 DIAGNOSIS — R079 Chest pain, unspecified: Secondary | ICD-10-CM | POA: Diagnosis present

## 2024-03-16 LAB — MYOCARDIAL PERFUSION IMAGING
LV dias vol: 59 mL (ref 46–106)
LV sys vol: 20 mL
Nuc Stress EF: 66 %
Peak HR: 127 {beats}/min
Rest HR: 95 {beats}/min
Rest Nuclear Isotope Dose: 10.3 mCi
SDS: 2
SRS: 2
SSS: 4
ST Depression (mm): 0 mm
Stress Nuclear Isotope Dose: 31.3 mCi
TID: 0.92

## 2024-03-16 MED ORDER — TECHNETIUM TC 99M TETROFOSMIN IV KIT
31.3000 | PACK | Freq: Once | INTRAVENOUS | Status: AC | PRN
  Administered 2024-03-16: 31.3 via INTRAVENOUS

## 2024-03-16 MED ORDER — REGADENOSON 0.4 MG/5ML IV SOLN
0.4000 mg | Freq: Once | INTRAVENOUS | Status: AC
Start: 2024-03-16 — End: 2024-03-16
  Administered 2024-03-16: 0.4 mg via INTRAVENOUS

## 2024-03-16 MED ORDER — TECHNETIUM TC 99M TETROFOSMIN IV KIT
10.3000 | PACK | Freq: Once | INTRAVENOUS | Status: AC | PRN
  Administered 2024-03-16: 10.3 via INTRAVENOUS

## 2024-03-20 ENCOUNTER — Telehealth: Payer: Self-pay | Admitting: Internal Medicine

## 2024-03-20 NOTE — Telephone Encounter (Signed)
  Pt c/o of Chest Pain: STAT if active CP, including tightness, pressure, jaw pain, radiating pain to shoulder/upper arm/back, CP unrelieved by Nitro. Symptoms reported of SOB, nausea, vomiting, sweating.  1. Are you having CP right now? Yes  (burning and painful)    2. Are you experiencing any other symptoms (ex. SOB, nausea, vomiting, sweating)? Her bp is low 77/42.    3. Is your CP continuous or coming and going? Coming and going    4. Have you taken Nitroglycerin? No    5. How long have you been experiencing CP? She states a year but worsen over week    6. If NO CP at time of call then end call with telling Pt to call back or call 911 if Chest pain returns prior to return call from triage team. .   She would also like to know stress test results, if available.

## 2024-03-20 NOTE — Telephone Encounter (Signed)
 Spoke with pt was told it was acute CP.  Pt then reports has chest burning that has occurred since OV with MD.  Expresses would like to know results of stress test to determine if symptoms are heart related. Reports feels like chest burning or like electricity is going through heart.  Denies arm, jaw, neck and shoulder pain.   Reports SBP 70's took PRN midodrine had pt sit quietly for 3 min and check BP 151/111-90.     Pt would like to know why her BP drops so low.  Pt reports drinks about 4 16 oz bottles of water a day doesn't want to drink anymore because will be in bathroom all day.  Advised pt to make sure she drinks throughout the day.   Pt reports in no immediate distress advised will send message regarding test results and why BP drops low to MD and Nurse to f/u.

## 2024-03-20 NOTE — Telephone Encounter (Signed)
 Spoke with pt and advised Dr Graciela Husbands has not yet reviewed study but once he has done this will contact regarding results.  Pt verbalizes understanding and agrees with current plan.

## 2024-03-21 ENCOUNTER — Encounter: Payer: Self-pay | Admitting: *Deleted

## 2024-03-21 NOTE — Telephone Encounter (Signed)
 Results of pt's normal study has been released to pt's active MyChart.

## 2024-04-05 ENCOUNTER — Ambulatory Visit (INDEPENDENT_AMBULATORY_CARE_PROVIDER_SITE_OTHER): Payer: Medicare Other

## 2024-04-05 DIAGNOSIS — I442 Atrioventricular block, complete: Secondary | ICD-10-CM | POA: Diagnosis not present

## 2024-04-05 LAB — CUP PACEART REMOTE DEVICE CHECK
Battery Voltage: 45
Date Time Interrogation Session: 20250410082616
Implantable Lead Connection Status: 753985
Implantable Lead Connection Status: 753985
Implantable Lead Implant Date: 20171017
Implantable Lead Implant Date: 20171017
Implantable Lead Location: 753859
Implantable Lead Location: 753860
Implantable Lead Model: 377
Implantable Lead Model: 377
Implantable Lead Serial Number: 49593512
Implantable Lead Serial Number: 49620910
Implantable Pulse Generator Implant Date: 20171017
Pulse Gen Model: 394969
Pulse Gen Serial Number: 68784192

## 2024-04-07 ENCOUNTER — Encounter: Payer: Self-pay | Admitting: Internal Medicine

## 2024-05-14 NOTE — Progress Notes (Signed)
 Remote pacemaker transmission.

## 2024-06-18 ENCOUNTER — Other Ambulatory Visit (HOSPITAL_BASED_OUTPATIENT_CLINIC_OR_DEPARTMENT_OTHER): Payer: Self-pay | Admitting: Cardiovascular Disease

## 2024-07-02 ENCOUNTER — Ambulatory Visit (HOSPITAL_BASED_OUTPATIENT_CLINIC_OR_DEPARTMENT_OTHER): Admitting: Cardiovascular Disease

## 2024-07-05 ENCOUNTER — Ambulatory Visit (INDEPENDENT_AMBULATORY_CARE_PROVIDER_SITE_OTHER): Payer: Medicare Other

## 2024-07-05 DIAGNOSIS — I442 Atrioventricular block, complete: Secondary | ICD-10-CM

## 2024-07-06 LAB — CUP PACEART REMOTE DEVICE CHECK
Battery Voltage: 40
Date Time Interrogation Session: 20250710103554
Implantable Lead Connection Status: 753985
Implantable Lead Connection Status: 753985
Implantable Lead Implant Date: 20171017
Implantable Lead Implant Date: 20171017
Implantable Lead Location: 753859
Implantable Lead Location: 753860
Implantable Lead Model: 377
Implantable Lead Model: 377
Implantable Lead Serial Number: 49593512
Implantable Lead Serial Number: 49620910
Implantable Pulse Generator Implant Date: 20171017
Pulse Gen Model: 394969
Pulse Gen Serial Number: 68784192

## 2024-07-11 ENCOUNTER — Ambulatory Visit: Payer: Self-pay | Admitting: Cardiology

## 2024-07-20 ENCOUNTER — Ambulatory Visit (HOSPITAL_BASED_OUTPATIENT_CLINIC_OR_DEPARTMENT_OTHER): Admitting: Cardiovascular Disease

## 2024-07-23 ENCOUNTER — Telehealth: Payer: Self-pay | Admitting: Student in an Organized Health Care Education/Training Program

## 2024-07-23 NOTE — Telephone Encounter (Incomplete)
 Brittney Cooper paged the on call cardiology pager to states that she is very lightheaded and her HR when checked is 36 bpm. She is unable to stand which is usual for her even with her dysautonomia diagnosis.

## 2024-07-23 NOTE — Telephone Encounter (Addendum)
 Brittney Cooper paged the on call cardiology pager to states that she is very lightheaded and her pulse when checked is 36 bpm. She is unable to stand which is unusual for her even with her dysautonomia diagnosis. Recommended her to go to the nearest ED. Given she has remained with a low pulse, I imagine that we would be able to see her dysrhythmia on telemetry. She has a Biotronick PPM. Would recommend labs included CMP, CBC, BNP, ddimer and other wok up the ED health professional recommends. Would also recommend some IV fluids.   Merlene Blood, MD MS  Cardiology [facility]

## 2024-07-24 ENCOUNTER — Emergency Department (HOSPITAL_COMMUNITY)

## 2024-07-24 ENCOUNTER — Observation Stay (HOSPITAL_COMMUNITY)
Admission: EM | Admit: 2024-07-24 | Discharge: 2024-07-24 | Discharge status: Against Medical Advice | Attending: Internal Medicine | Admitting: Internal Medicine

## 2024-07-24 ENCOUNTER — Other Ambulatory Visit: Payer: Self-pay

## 2024-07-24 DIAGNOSIS — E038 Other specified hypothyroidism: Secondary | ICD-10-CM | POA: Diagnosis present

## 2024-07-24 DIAGNOSIS — R9431 Abnormal electrocardiogram [ECG] [EKG]: Secondary | ICD-10-CM | POA: Diagnosis present

## 2024-07-24 DIAGNOSIS — Z7982 Long term (current) use of aspirin: Secondary | ICD-10-CM | POA: Diagnosis not present

## 2024-07-24 DIAGNOSIS — Z79899 Other long term (current) drug therapy: Secondary | ICD-10-CM | POA: Insufficient documentation

## 2024-07-24 DIAGNOSIS — I959 Hypotension, unspecified: Secondary | ICD-10-CM | POA: Diagnosis present

## 2024-07-24 DIAGNOSIS — I4581 Long QT syndrome: Secondary | ICD-10-CM | POA: Diagnosis not present

## 2024-07-24 DIAGNOSIS — R0602 Shortness of breath: Secondary | ICD-10-CM | POA: Diagnosis present

## 2024-07-24 DIAGNOSIS — I442 Atrioventricular block, complete: Secondary | ICD-10-CM | POA: Diagnosis present

## 2024-07-24 DIAGNOSIS — D649 Anemia, unspecified: Secondary | ICD-10-CM | POA: Diagnosis present

## 2024-07-24 DIAGNOSIS — F32A Depression, unspecified: Secondary | ICD-10-CM | POA: Diagnosis not present

## 2024-07-24 DIAGNOSIS — F332 Major depressive disorder, recurrent severe without psychotic features: Secondary | ICD-10-CM | POA: Diagnosis present

## 2024-07-24 DIAGNOSIS — G909 Disorder of the autonomic nervous system, unspecified: Secondary | ICD-10-CM | POA: Insufficient documentation

## 2024-07-24 DIAGNOSIS — F10920 Alcohol use, unspecified with intoxication, uncomplicated: Secondary | ICD-10-CM | POA: Insufficient documentation

## 2024-07-24 DIAGNOSIS — E860 Dehydration: Principal | ICD-10-CM

## 2024-07-24 DIAGNOSIS — E785 Hyperlipidemia, unspecified: Secondary | ICD-10-CM | POA: Diagnosis not present

## 2024-07-24 DIAGNOSIS — F17220 Nicotine dependence, chewing tobacco, uncomplicated: Secondary | ICD-10-CM | POA: Insufficient documentation

## 2024-07-24 DIAGNOSIS — F909 Attention-deficit hyperactivity disorder, unspecified type: Secondary | ICD-10-CM | POA: Diagnosis not present

## 2024-07-24 DIAGNOSIS — Z720 Tobacco use: Secondary | ICD-10-CM | POA: Diagnosis present

## 2024-07-24 DIAGNOSIS — F419 Anxiety disorder, unspecified: Secondary | ICD-10-CM | POA: Diagnosis not present

## 2024-07-24 DIAGNOSIS — E1165 Type 2 diabetes mellitus with hyperglycemia: Secondary | ICD-10-CM | POA: Diagnosis present

## 2024-07-24 DIAGNOSIS — E119 Type 2 diabetes mellitus without complications: Secondary | ICD-10-CM | POA: Insufficient documentation

## 2024-07-24 DIAGNOSIS — N179 Acute kidney failure, unspecified: Secondary | ICD-10-CM | POA: Diagnosis present

## 2024-07-24 DIAGNOSIS — Z950 Presence of cardiac pacemaker: Secondary | ICD-10-CM | POA: Diagnosis present

## 2024-07-24 DIAGNOSIS — R55 Syncope and collapse: Principal | ICD-10-CM | POA: Diagnosis present

## 2024-07-24 DIAGNOSIS — G901 Familial dysautonomia [Riley-Day]: Secondary | ICD-10-CM

## 2024-07-24 LAB — TYPE AND SCREEN
ABO/RH(D): O NEG
Antibody Screen: NEGATIVE

## 2024-07-24 LAB — MAGNESIUM: Magnesium: 1.6 mg/dL — ABNORMAL LOW (ref 1.7–2.4)

## 2024-07-24 LAB — COMPREHENSIVE METABOLIC PANEL WITH GFR
ALT: 40 U/L (ref 0–44)
AST: 27 U/L (ref 15–41)
Albumin: 4.1 g/dL (ref 3.5–5.0)
Alkaline Phosphatase: 55 U/L (ref 38–126)
Anion gap: 11 (ref 5–15)
BUN: 24 mg/dL — ABNORMAL HIGH (ref 8–23)
CO2: 25 mmol/L (ref 22–32)
Calcium: 9.5 mg/dL (ref 8.9–10.3)
Chloride: 103 mmol/L (ref 98–111)
Creatinine, Ser: 1.51 mg/dL — ABNORMAL HIGH (ref 0.44–1.00)
GFR, Estimated: 39 mL/min — ABNORMAL LOW (ref >60)
Glucose, Bld: 106 mg/dL — ABNORMAL HIGH (ref 70–99)
Potassium: 3.8 mmol/L (ref 3.5–5.1)
Sodium: 139 mmol/L (ref 135–145)
Total Bilirubin: 0.6 mg/dL (ref 0.0–1.2)
Total Protein: 6.1 g/dL — ABNORMAL LOW (ref 6.5–8.1)

## 2024-07-24 LAB — CBC
HCT: 35.4 % — ABNORMAL LOW (ref 36.0–46.0)
Hemoglobin: 11.9 g/dL — ABNORMAL LOW (ref 12.0–15.0)
MCH: 33 pg (ref 26.0–34.0)
MCHC: 33.6 g/dL (ref 30.0–36.0)
MCV: 98.1 fL (ref 80.0–100.0)
Platelets: 220 K/uL (ref 150–400)
RBC: 3.61 MIL/uL — ABNORMAL LOW (ref 3.87–5.11)
RDW: 13.2 % (ref 11.5–15.5)
WBC: 6.7 K/uL (ref 4.0–10.5)
nRBC: 0 % (ref 0.0–0.2)

## 2024-07-24 LAB — TROPONIN I (HIGH SENSITIVITY)
Troponin I (High Sensitivity): 11 ng/L (ref <18)
Troponin I (High Sensitivity): 19 ng/L — ABNORMAL HIGH (ref <18)

## 2024-07-24 LAB — D-DIMER, QUANTITATIVE: D-Dimer, Quant: 0.27 ug{FEU}/mL (ref 0.00–0.50)

## 2024-07-24 LAB — TSH: TSH: 8.8 u[IU]/mL — ABNORMAL HIGH (ref 0.350–4.500)

## 2024-07-24 LAB — HEMOGLOBIN A1C
Hgb A1c MFr Bld: 6 % — ABNORMAL HIGH (ref 4.8–5.6)
Mean Plasma Glucose: 125.5 mg/dL

## 2024-07-24 LAB — HEMOGLOBIN AND HEMATOCRIT, BLOOD
HCT: 33.3 % — ABNORMAL LOW (ref 36.0–46.0)
Hemoglobin: 11.1 g/dL — ABNORMAL LOW (ref 12.0–15.0)

## 2024-07-24 LAB — BRAIN NATRIURETIC PEPTIDE: B Natriuretic Peptide: 51.8 pg/mL (ref 0.0–100.0)

## 2024-07-24 LAB — HIV ANTIBODY (ROUTINE TESTING W REFLEX): HIV Screen 4th Generation wRfx: NONREACTIVE

## 2024-07-24 LAB — T4, FREE: Free T4: 0.81 ng/dL (ref 0.61–1.12)

## 2024-07-24 MED ORDER — QUETIAPINE FUMARATE 100 MG PO TABS: 300.0000 mg | ORAL_TABLET | Freq: Every day | ORAL | Status: DC

## 2024-07-24 MED ORDER — SODIUM CHLORIDE 0.9 % IV SOLN: Freq: Once | INTRAVENOUS | Status: AC

## 2024-07-24 MED ORDER — TRIMETHOBENZAMIDE HCL 100 MG/ML IM SOLN: 200.0000 mg | Freq: Four times a day (QID) | INTRAMUSCULAR | Status: DC | PRN

## 2024-07-24 MED ORDER — ROSUVASTATIN CALCIUM 20 MG PO TABS: 20.0000 mg | ORAL_TABLET | Freq: Every day | ORAL | Status: DC

## 2024-07-24 MED ORDER — MAGNESIUM SULFATE 2 GM/50ML IV SOLN
2.0000 g | Freq: Once | INTRAVENOUS | Status: AC
  Administered 2024-07-24: 2 g via INTRAVENOUS
  Filled 2024-07-24: qty 50

## 2024-07-24 MED ORDER — INSULIN ASPART 100 UNIT/ML IJ SOLN: 0.0000 [IU] | Freq: Every day | INTRAMUSCULAR | Status: DC

## 2024-07-24 MED ORDER — SODIUM CHLORIDE 0.9 % IV SOLN: INTRAVENOUS | Status: DC

## 2024-07-24 MED ORDER — BUPROPION HCL ER (SR) 100 MG PO TB12: 100.0000 mg | ORAL_TABLET | Freq: Two times a day (BID) | ORAL | Status: DC

## 2024-07-24 MED ORDER — ACETAMINOPHEN 325 MG PO TABS
650.0000 mg | ORAL_TABLET | Freq: Four times a day (QID) | ORAL | Status: DC | PRN
Start: 2024-07-24 — End: 2024-07-24

## 2024-07-24 MED ORDER — NICOTINE 14 MG/24HR TD PT24: 14.0000 mg | MEDICATED_PATCH | Freq: Every day | TRANSDERMAL | Status: DC

## 2024-07-24 MED ORDER — PANTOPRAZOLE SODIUM 40 MG PO TBEC
40.0000 mg | DELAYED_RELEASE_TABLET | Freq: Two times a day (BID) | ORAL | Status: DC
Start: 2024-07-24 — End: 2024-07-24

## 2024-07-24 MED ORDER — ROPINIROLE HCL 1 MG PO TABS
1.0000 mg | ORAL_TABLET | Freq: Every day | ORAL | Status: DC
Start: 2024-07-24 — End: 2024-07-24

## 2024-07-24 MED ORDER — MIDODRINE HCL 5 MG PO TABS: 2.5000 mg | ORAL_TABLET | Freq: Every day | ORAL | Status: DC | PRN

## 2024-07-24 MED ORDER — ALPRAZOLAM 0.25 MG PO TABS: 0.5000 mg | ORAL_TABLET | Freq: Three times a day (TID) | ORAL | Status: DC | PRN

## 2024-07-24 MED ORDER — SODIUM CHLORIDE 0.9% FLUSH: 3.0000 mL | Freq: Two times a day (BID) | INTRAVENOUS | Status: DC

## 2024-07-24 MED ORDER — ARIPIPRAZOLE 10 MG PO TABS: 10.0000 mg | ORAL_TABLET | Freq: Every day | ORAL | Status: DC

## 2024-07-24 MED ORDER — GABAPENTIN 300 MG PO CAPS: 600.0000 mg | ORAL_CAPSULE | Freq: Every day | ORAL | Status: DC

## 2024-07-24 MED ORDER — ALBUTEROL SULFATE (2.5 MG/3ML) 0.083% IN NEBU: 2.5000 mg | INHALATION_SOLUTION | Freq: Four times a day (QID) | RESPIRATORY_TRACT | Status: DC | PRN

## 2024-07-24 MED ORDER — ACETAMINOPHEN 650 MG RE SUPP: 650.0000 mg | Freq: Four times a day (QID) | RECTAL | Status: DC | PRN

## 2024-07-24 MED ORDER — INSULIN ASPART 100 UNIT/ML IJ SOLN: 0.0000 [IU] | Freq: Three times a day (TID) | INTRAMUSCULAR | Status: DC

## 2024-07-24 MED ORDER — FLECAINIDE ACETATE 50 MG PO TABS: 50.0000 mg | ORAL_TABLET | Freq: Two times a day (BID) | ORAL | Status: DC

## 2024-07-24 NOTE — ED Notes (Signed)
 Nt called ccmd@7 :44am

## 2024-07-24 NOTE — ED Notes (Signed)
 Patient came to this RN endorsing she is ready to leave and would like to see the doctor, patient states I can't wait another hour. MD Claudene notified and aware.

## 2024-07-24 NOTE — Care Management CC44 (Signed)
 Condition Code 44 Documentation Completed  Patient Details  Name: Brittney Cooper MRN: [id number] Date of Birth: 1961-02-07   Condition Code 44 given:  Yes Patient signature on Condition Code 44 notice:  Yes Documentation of 2 MD's agreement:  Yes Code 44 added to claim:  Yes    Debarah Saunas, RN 07/24/2024, 11:25 AM

## 2024-07-24 NOTE — TOC Initial Note (Signed)
 Transition of Care [facility]) - Initial/Assessment Note    Patient Details  Name: Brittney Cooper MRN: [id number] Date of Birth: October 15, 1961  [facility]) CM/SW Contact:    Debarah Saunas, RN Phone Number: 07/24/2024, 11:31 AM  Clinical Narrative:                 Transition of Care Department [facility]) has reviewed patient and no TOC needs have been identified at this time. We will continue to monitor patient advancement through interdisciplinary progression rounds. If new patient transition needs arise, please place a TOC consult.  Expected Discharge Plan: Home/Self Care Barriers to Discharge: Continued Medical Work up   Patient Goals and CMS Choice Patient states their goals for this hospitalization and ongoing recovery are:: to find out why I have these episodes twice weekly          Expected Discharge Plan and Services   Discharge Planning Services: CM Consult Post Acute Care Choice: Durable Medical Equipment Living arrangements for the past 2 months: Single Family Home                                      Prior Living Arrangements/Services Living arrangements for the past 2 months: Single Family Home Lives with:: Adult Children              Current home services: DME    Activities of Daily Living      Permission Sought/Granted                  Emotional Assessment Appearance:: Appears stated age Attitude/Demeanor/Rapport: Engaged, Ambitious Affect (typically observed): Pleasant, Hopeful Orientation: : Oriented to Self, Oriented to Place, Oriented to  Time, Oriented to Situation Alcohol  / Substance Use: Not Applicable Psych Involvement: No (comment)  Admission diagnosis:  Syncope and collapse [R55] Patient Active Problem List   Diagnosis Date Noted   Syncope and collapse 07/24/2024   Severe bipolar I disorder, most recent episode depressed (HCC) 04/17/2023   Alcohol  dependence (HCC) 04/17/2023   Neurocardiogenic syncope 09/13/2022    Osteomyelitis of right fibula (HCC) 07/30/2022   Subacute osteomyelitis of right fibula (HCC)    Peripherally inserted central catheter (PICC) in place 06/08/2022   MSSA (methicillin susceptible Staphylococcus aureus) infection 06/08/2022   Acute hematogenous osteomyelitis of right fibula (HCC) 05/25/2022   Infected hardware in right lower extremity (HCC) 05/23/2022   Tobacco abuse 11/30/2020   Hypertension 11/10/2020   Atrial tachycardia (HCC) 03/12/2020   Dysautonomia (HCC) 01/13/2020   Pacemaker 01/13/2020   Type 2 diabetes mellitus with hyperglycemia, without long-term current use of insulin  (HCC) 02/09/2017   MDD (major depressive disorder), recurrent severe, 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   Complete heart block (HCC)    Palpitation 10/11/2016   Tobacco use 09/15/2016   Anxiety 09/15/2016   Hypokalemia 09/15/2016   Pancreatitis 07/09/2016   Alcoholic pancreatitis 07/08/2016   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   Left shoulder pain 09/09/2014   PCP:  Vivian Barefoot, Lavern Backers, MD Pharmacy:   [facility] [place], [place] - [address] [place] [address] Phone: [phone] Fax: [phone]  [facility] [place] - [address] [place] [address] Phone: [phone] Fax: [phone]     Social Drivers of Health (SDOH) Social History: SDOH Screenings   Food Insecurity: Food Insecurity Present (07/10/2024)   Received from [facility]  Housing: High Risk (07/10/2024)   Received from [facility]  Transportation Needs: Unmet Transportation Needs (07/10/2024)   Received from [facility]  Utilities: Not At Risk (07/10/2024)   Received from [facility]  Alcohol  Screen: High Risk (04/16/2023)  Depression (PHQ2-9): Medium Risk (06/14/2023)  Financial Resource Strain: High Risk (07/10/2024)   Received from [facility]  Physical Activity: Insufficiently Active (07/10/2024)   Received from [facility]   Social Connections: Socially Isolated (07/10/2024)   Received from [facility]  Stress: Stress Concern Present (07/10/2024)   Received from [facility]  Tobacco Use: High Risk (07/16/2024)   Received from [facility]   SDOH Interventions:     Readmission Risk Interventions     No data to display

## 2024-07-24 NOTE — H&P (Signed)
 History and Physical    Patient: Brittney Cooper FMW:[id number] DOB: 1961-12-12 DOA: 07/24/2024 DOS: the patient was seen and examined on 07/24/2024 PCP: Vivian Barefoot, Lavern Backers, MD  Patient coming from: Home via EMS  Chief Complaint:  Chief Complaint  Patient presents with   Hypotension   HPI: Brittney Cooper is a 63 y.o. female with medical history significant of hypertension, hyperlipidemia, complete heart block  s/p pacemaker, dysautonomia, atrial tachycardia, diabetes mellitus type 2, alcoholic  pancreatitis, depression, anxiety, bipolar disorder, and tobacco abuse  recurrent episodes of lightheadedness and syncope.  She experiences lightheadedness a couple of times a week, which has been progressively worsening. The episodes feel like she is going to pass out, with the time to react before needing to sit or lie down decreasing from three seconds to one second. Yesterday evening, she was unable to stand without becoming lightheaded, requiring her to drop to her hands and knees or lie down. Her son assisted her in checking her blood pressure and pulse, which initially failed to register due to a low pulse rate. Eventually, her pulse was recorded at 37 bpm, and later by ambulance personnel at 57 bpm.  She has experienced three falls in the past week, one of which resulted in loss of consciousness and a bruise on her left hip. It is not unusual for her to pass out, although she usually does not lose consciousness.  She has a history of a pacemaker and has been seen by multiple cardiologists. A recent stress test conducted within the past month was normal. No chest pain, but she reports a sensation of her skin hurting, described as 'pins and needles' or like a 'really bad sunburn,' which has been present for three months and is now affecting her back.  She smokes a pack a day and is scheduled for a lung screening. No alcohol  use since undergoing chemical detox in April of the previous year.  She takes naproxen twice daily and aspirin  81 mg daily. She has not noticed any black or dark stools, although her hemoglobin has dropped from 13.9 two weeks ago to 11.1 currently. Her father recently experienced a GI bleed.  She reports a cough, but no fever.  With EMS patient was noted to have blood pressures as low as 84/50 and pulse noted as low as 57.  Patient had been given 700 mL of normal saline IV fluids with improvement of blood pressure.   In the emergency department patient was noted to be afebrile with pulse 78-81, and all other vital signs relatively maintained.  Labs significant for hemoglobin 11.9, creatinine 1.51, BUN 24, magnesium  1.6, D-dimer 0.27, and high-sensitivity troponin 11-> 19.  X-rays of the left hip revealed no acute bony abnormality and findings consistent with colonic constipation.  Chest x-ray was unremarkable.  Interrogation of pacemaker noted many episodes of junctional tachycardia.  Patient was started on normal saline IV fluids.  Cardiology had been consulted and recommended rehydrating the patient.  TRH consulted to admit.  Review of Systems: As mentioned in the history of present illness. All other systems reviewed and are negative. 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   Allergy    Anxiety    Arthritis    Bipolar disorder (HCC) 2    Cardiac arrest (HCC) 06-2015, 08-2016   x3- pt has pacemaker    Chronic pain  back   Complication of anesthesia    woke up during 2 surgical procedure hydterectomy and colonscopy   Depression    Diabetes mellitus without complication (HCC)    Type 2   Esophageal mass 02/2019   has upper endo with us  with biopsy   Fatty liver    Fracture of lateral malleolus of right fibula 06/2015   GERD (gastroesophageal reflux disease)    history ofHepatomegaly    Hyperlipidemia    Hypertension    Neurocardiogenic  syncope    faints 2 x week   Neurocardiogenic syncope 09/13/2022   Neuromuscular disorder (HCC)    Dystonia   OCD (obsessive compulsive disorder)    OCD (obsessive compulsive disorder)    Osteoporosis    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   Pancreatic pseudocyst    Panic attack    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, working on OB floor as a nurse   Syncope    neurocardiogenic   Third degree heart block (HCC)    Past Surgical History:  Procedure Laterality Date   ABDOMINAL HYSTERECTOMY  2009   ANTERIOR AND POSTERIOR REPAIR  2009   BONE EXCISION Right 07/08/2022   Procedure: PARTIAL FIBULA BONE EXCISION WITH I&D;  Surgeon: Beverley Evalene BIRCH, MD;  Location: [facility];  Service: Orthopedics;  Laterality: Right;   BUNIONECTOMY Bilateral    CESAREAN SECTION     EP IMPLANTABLE DEVICE N/A 10/12/2016   Procedure: Biotronik Pacemaker Implant;  Surgeon: Will Gladis Norton, MD;  Location: [facility] INVASIVE CV LAB;  Service: Cardiovascular;  Laterality: N/A;   finfger surgery Left    left middle fingger   FINGER SURGERY     HARDWARE REMOVAL Right 05/23/2022   Procedure: HARDWARE REMOVAL;  Surgeon: Josefina Chew, MD;  Location: [facility] OR;  Service: Orthopedics;  Laterality: Right;   I & D EXTREMITY Right 07/30/2022   Procedure: PARTIAL EXCISION RIGHT FIBULA;  Surgeon: Harden Jerona GAILS, MD;  Location: [facility] OR;  Service: Orthopedics;  Laterality: Right;   INCISION AND DRAINAGE ABSCESS Right 05/23/2022   Procedure: INCISION AND DRAINAGE ABSCESS, RIGHT ANKLE;  Surgeon: Josefina Chew, MD;  Location: [facility] OR;  Service: Orthopedics;  Laterality: Right;   LTCS     MYRINGOTOMY  1966   ORIF ANKLE FRACTURE Right 07/25/2015   Procedure: OPEN REDUCTION INTERNAL FIXATION (ORIF) RIGHT ANKLE ;  Surgeon: Evalene BIRCH Beverley, MD;  Location: [facility];  Service: Orthopedics;  Laterality: Right;   RHINOPLASTY     age 80   sub mandibular cyst removal from right  side of tongue  2019   TONSILLECTOMY  1966   and adenoids removed   TUBAL LIGATION  2005   Social History:  reports that she has been smoking cigarettes. She has a 15 pack-year smoking history. She has never used smokeless tobacco. She reports current alcohol  use. She reports that she does not use drugs.  Allergies  Allergen Reactions   Codeine Nausea And Vomiting    Pt stated she also thought it made her hallucinate   Lopressor  [Metoprolol ] Other (See Comments)    Nightmares    Meperidine Other (See Comments)    Hallucinations  Other Reaction(s): Psychosis  Hallucinations, Other reaction(s): Other (See Comments), Hallucinations, Other reaction(s): HALLUCINATIONS, Hallucinations, Hallucinations    Other reaction(s): Other (See Comments) Hallucinations Other reaction(s): HALLUCINATIONS Hallucinations Hallucinations    Hallucinations Other reaction(s): Other (See Comments) Hallucinations Other reaction(s): HALLUCINATIONS Hallucinations Hallucinations  Hallucinations  Hallucinations, Other reaction(s): Other (See Comments), Hallucinations, Other reaction(s): HALLUCINATIONS, Hallucinations, Hallucinations    Hallucinations, Other reaction(s): Other (See Comments), Hallucinations, Other reaction(s): HALLUCINATIONS, Hallucinations, Hallucinations  Other reaction(s): Other (See Comments) Hallucinations Other reaction(s): HALLUCINATIONS Hallucinations Hallucinations  Hallucinations Other reaction(s): Other (See Comments) Hallucinations Other reaction(s): HALLUCINATIONS Hallucinations Hallucinations  Hallucinations   Norco [Hydrocodone -Acetaminophen ] Nausea And Vomiting    Pt reports Vicodin caused vomiting for six hours, reports being able to tolerate oxycodone  and tramadol . Can take tylenol    Jardiance [Empagliflozin] Other (See Comments)    Yeast infections    Family History  Problem Relation Age of Onset   Stroke Mother    Kidney disease Mother    Colon polyps Mother     Hypertension Mother    Kidney failure Mother    Lung cancer Father        stage IV    Colon polyps Father    Hypertension Father    Stomach cancer Maternal Grandfather    Colon cancer Paternal Grandfather    Hypertension Sister    Hyperlipidemia Sister    Hypertension Brother    Hyperlipidemia Brother    Rectal cancer Neg Hx    Esophageal cancer Neg Hx    Pancreatic cancer Neg Hx     Prior to Admission medications   Medication Sig Start Date End Date Taking? Authorizing Provider  ALPRAZolam  (XANAX ) 0.5 MG tablet Take 0.5 mg by mouth 3 (three) times daily as needed for anxiety. 07/18/24  Yes [provider]  amphetamine -dextroamphetamine  (ADDERALL XR) 30 MG 24 hr capsule Take 30 mg by mouth daily. 06/26/24  Yes [provider]  amphetamine -dextroamphetamine  (ADDERALL) 5 MG tablet Take 1 tablet by mouth daily as needed (ADHD). 06/26/24  Yes [provider]  ARIPiprazole  (ABILIFY ) 10 MG tablet Take 1 tablet (10 mg total) by mouth daily. 04/23/23  Yes Larina Fling, MD  aspirin  81 MG chewable tablet Chew by mouth daily.   Yes [provider]  bisoprolol  (ZEBETA ) 5 MG tablet Take 1 tablet by mouth every day 06/19/24  Yes Raford Riggs, MD  buPROPion  ER (WELLBUTRIN  SR) 100 MG 12 hr tablet Take 100 mg by mouth 2 (two) times daily. 07/12/24  Yes [provider]  doxazosin  (CARDURA ) 1 MG tablet TAKE 1 TABLET BY MOUTH AT BEDTIME 01/19/23  Yes Fernande Elspeth BROCKS, MD  ergocalciferol (VITAMIN D2) 1.25 MG (50000 UT) capsule Take 50,000 Units by mouth once a week. Take one capsule by mouth weekly on Monday   Yes [provider]  flecainide  (TAMBOCOR ) 50 MG tablet Take 1 tablet (50 mg total) by mouth 2 (two) times daily. Please call [phone] to schedule overdue appointment for further refills. 12/19/23  Yes Raford Riggs, MD  folic acid  (FOLVITE ) 1 MG tablet Take 1 mg by mouth daily.   Yes [provider]  gabapentin  (NEURONTIN ) 300 MG  capsule Take 1 capsule (300 mg total) by mouth 2 (two) times daily. Patient taking differently: Take 600 mg by mouth at bedtime. 04/22/23  Yes Goli, Veeraindar, MD  glipiZIDE  (GLUCOTROL ) 10 MG tablet Take 1 tablet (10 mg total) by mouth daily before breakfast. 04/23/23  Yes Larina Fling, MD  losartan  (COZAAR ) 25 MG tablet Take 1 tablet by mouth every day Patient taking differently: Take 25 mg by mouth daily as needed (HBP). Take one tablet by mouth daily as needed if SBP > 160 09/20/23  Yes Raford Riggs, MD  MAGNESIUM  GLYCINATE ADVANCED PO Take 200 mg by mouth at  bedtime.   Yes [provider]  metFORMIN  (GLUCOPHAGE ) 1000 MG tablet Take 1,000 mg by mouth 2 (two) times daily. 12/25/19  Yes [provider]  midodrine  (PROAMATINE ) 2.5 MG tablet Take 2.5 mg by mouth as needed (for SBP less than 130).   Yes [provider]  naproxen sodium (ALEVE) 220 MG tablet Take 440 mg by mouth in the morning and at bedtime.   Yes [provider]  OZEMPIC, 1 MG/DOSE, 4 MG/3ML SOPN Inject 1 mg into the skin once a week. Inject 1mg  subcutaneously once per week on Tuesday. 05/22/24  Yes [provider]  pantoprazole  (PROTONIX ) 40 MG tablet Take 40 mg by mouth 2 (two) times daily. 07/12/24  Yes [provider]  QUEtiapine  (SEROQUEL ) 300 MG tablet Take 300 mg by mouth at bedtime. 07/12/24  Yes [provider]  rOPINIRole  (REQUIP ) 1 MG tablet Take 1 mg by mouth at bedtime.   Yes [provider]  rosuvastatin  (CRESTOR ) 20 MG tablet Take 20 mg by mouth at bedtime. 07/12/24  Yes [provider]  thiamine  (VITAMIN B-1) 100 MG tablet Take 100 mg by mouth daily.   Yes [provider]    Physical Exam: Vitals:   07/24/24 0200 07/24/24 0230 07/24/24 0300 07/24/24 0541  BP: (!) 116/94 (!) 118/92 117/82   Pulse: 80 79 78   Resp: 16 18 (!) 22   Temp:    98.3 F (36.8 C)  TempSrc:    Oral  SpO2: 100% 98% 96%   Weight:      Height:         Constitutional: Older adult female currently in no acute distress Eyes: PERRL, lids and conjunctivae normal ENMT: Mucous membranes are moist. Posterior pharynx clear of any exudate or lesions.Normal dentition.  Neck: normal, supple, no masses, no thyromegaly Respiratory: clear to auscultation bilaterally, no wheezing, no crackles. Normal respiratory effort. No accessory muscle use.  Cardiovascular: Regular rate and rhythm, no murmurs / rubs / gallops. No extremity edema. 2+ pedal pulses. No carotid bruits.  Abdomen: no tenderness, no masses palpated. No hepatosplenomegaly. Bowel sounds positive.  Musculoskeletal: no clubbing / cyanosis. No joint deformity upper and lower extremities. Good ROM, no contractures. Normal muscle tone.  Skin: no rashes, lesions, ulcers. No induration Neurologic: CN 2-12 grossly intact. Sensation intact, DTR normal. Strength 5/5 in all 4.  Psychiatric: Normal judgment and insight. Alert and oriented x 3. Normal mood.   Data Reviewed:   EKG revealed sinus rhythm at 81 bpm with QTc prolonged at 570, but noted significant amount of background artifact present.  Reviewed labs, imaging, and pertinent records as documented.  Assessment and Plan:  Recurrent syncope Hypotension Autonomic dysfunction Patient has long history of autonomic dysfunction who presented with complaints of increasing dizziness after having 3 episodes of syncope within this last week.  Earlier today had noted that her pulse was 37 and blood pressure was low.  Initial blood pressures noted to be as low as 84/50.  Pulse currently stable.   - Admit to a cardiac telemetry bed - Up with assistance - Check orthostatic vital signs - PT to evaluate and treat  - Continue midodrine  as needed  Junctional tachycardia Complete heart block s/p PPM At home prior to transport patient reported heart rates of 37.  Interrogation of pacemaker noted many episodes of junctional tachycardia. - Continue  flecainide  and bisoprolol    Acute kidney injury Creatinine noted to be elevated at 1.51 with BUN 24.  Baseline creatinine  previously noted to be around 1.2 when checked on 7/11.  Patient had been started on IV fluids. - Check urinalysis   - Continue normal saline IV fluids - Hold possible nephrotoxic agents such as losartan  - Recheck kidney function in a.m.  Normocytic anemia Acute.  Hemoglobin noted to be 11.9 baseline hemoglobin previously noted to be around 14 when last checked on 7/11.  Denies any reports of bleeding, but did report using naproxen regularly. - Type and screen for possible need of blood products - Recheck H&H this morning and check stool guaiac - Held aspirin  and naproxen  Hypomagnesemia Acute.  Magnesium  noted to be low at 1.6. - Give magnesium  sulfate IV - Continue to monitor and replace as needed  Diabetes mellitus type 2, without long-term use of insulin  Last hemoglobin A1c noted to be 10.7 when checked 04/18/2023. - Hypoglycemia protocols - Hold glipizide , metformin , Ozempic - CBGs before every meal with sensitive SSI  Prolonged QT interval QTc prolonged at 570. - Avoid QT prolonging medications  Anxiety and depression Bipolar disorder - Continue Seroquel , Abilify , and Xanax  as needed  ADHD - Adderall in outpatient setting  Hyperlipidemia - Continue Crestor   Subclinical hypothyroidism TSH - 8.8 with free T4 - 0.81.  TSH have been normal within normal limits at around 2 when checked on 7/11. - Will need to recheck thyroid  function in outpatient setting in 4 to 6 weeks  Tobacco abuse Patient smokes a pack cigarettes per day on average. - Nicotine  patch offered  DVT prophylaxis: SCDs due to drop in hemoglobin.  Reassess and determine if medically appropriate to start on Advance Care Planning:   Code Status: Full Code    Consults: Cardiology  Family Communication: None requested Severity of Illness: The appropriate patient status for this  patient is OBSERVATION. Observation status is judged to be reasonable and necessary in order to provide the required intensity of service to ensure the patient's safety. The patient's presenting symptoms, physical exam findings, and initial radiographic and laboratory data in the context of their medical condition is felt to place them at decreased risk for further clinical deterioration. Furthermore, it is anticipated that the patient will be medically stable for discharge from the hospital within 2 midnights of admission.   Author: Maximino DELENA Sharps, MD 07/24/2024 7:17 AM  For on call review [email or web address].

## 2024-07-24 NOTE — Care Management Obs Status (Signed)
 MEDICARE OBSERVATION STATUS NOTIFICATION   Patient Details  Name: Brittney Cooper MRN: [id number] Date of Birth: Sep 27, 1961   Medicare Observation Status Notification Given:  Yes    Debarah Saunas, RN 07/24/2024, 11:25 AM

## 2024-07-24 NOTE — ED Notes (Signed)
 Gave pt a sandwich bag, orange juice, and graham crackers

## 2024-07-24 NOTE — ED Provider Notes (Signed)
 [place] EMERGENCY DEPARTMENT AT [facility] Provider Note   CSN: [id number] Arrival date & time: 07/24/24  0111     Patient presents with: Hypotension   Brittney Cooper is a 63 y.o. female with medical history to include alcohol  abuse, alcoholic pancreatitis, pacemaker in place, neurocardiogenic syncope, dysautonomia.  Patient presents to the ED for evaluation.  Patient reports that this evening she was at her home.  States that she deals often with dysautonomia and hypotension as a result.  States that twice a week for the last 6 years she typically has to lay down for because she feels she's  becoming lightheaded.  States that this evening she felt her self becoming lightheaded and she layed down on the floor.  States that tonight the difference from her usual episodes is that this episode lasted much longer.  She reports that a couple days ago she did experience lightheadedness and fell on her left hip as a result.  She is unsure if she lost consciousness tonight.  She denies any chest pain, shortness of breath, nausea, vomiting, headache, fever.  Denies any current headedness, dizziness or weakness.  Her blood pressure is currently 113/64 and her pulse rate is 83.  She reports that earlier today when she was feeling lightheaded, she noted that her pulse was 37 and her blood pressure was noted to be low on home blood pressure machine but she is unable to tell me the value of her BP.  She called on-call cardiology who advised she come to the ED for further management.  Currently she has no complaints.   HPI     Prior to Admission medications   Medication Sig Start Date End Date Taking? Authorizing Provider  ALPRAZolam  (XANAX ) 0.5 MG tablet Take 0.5 mg by mouth 3 (three) times daily as needed for anxiety. 07/18/24  Yes [provider]  amphetamine -dextroamphetamine  (ADDERALL XR) 30 MG 24 hr capsule Take 30 mg by mouth daily. 06/26/24  Yes [provider]   amphetamine -dextroamphetamine  (ADDERALL) 5 MG tablet Take 1 tablet by mouth daily as needed (ADHD). 06/26/24  Yes [provider]  ARIPiprazole  (ABILIFY ) 10 MG tablet Take 1 tablet (10 mg total) by mouth daily. 04/23/23  Yes Larina Fling, MD  aspirin  81 MG chewable tablet Chew by mouth daily.   Yes [provider]  bisoprolol  (ZEBETA ) 5 MG tablet Take 1 tablet by mouth every day 06/19/24  Yes Raford Riggs, MD  buPROPion  ER (WELLBUTRIN  SR) 100 MG 12 hr tablet Take 100 mg by mouth 2 (two) times daily. 07/12/24  Yes [provider]  doxazosin  (CARDURA ) 1 MG tablet TAKE 1 TABLET BY MOUTH AT BEDTIME 01/19/23  Yes Fernande Elspeth JAYSON, MD  ergocalciferol (VITAMIN D2) 1.25 MG (50000 UT) capsule Take 50,000 Units by mouth once a week. Take one capsule by mouth weekly on Monday   Yes [provider]  flecainide  (TAMBOCOR ) 50 MG tablet Take 1 tablet (50 mg total) by mouth 2 (two) times daily. Please call [phone] to schedule overdue appointment for further refills. 12/19/23  Yes Raford Riggs, MD  folic acid  (FOLVITE ) 1 MG tablet Take 1 mg by mouth daily.   Yes [provider]  gabapentin  (NEURONTIN ) 300 MG capsule Take 1 capsule (300 mg total) by mouth 2 (two) times daily. Patient taking differently: Take 600 mg by mouth at bedtime. 04/22/23  Yes Goli, Veeraindar, MD  glipiZIDE  (GLUCOTROL ) 10 MG tablet Take 1 tablet (10 mg total) by mouth daily before  breakfast. 04/23/23  Yes Goli, Veeraindar, MD  losartan  (COZAAR ) 25 MG tablet Take 1 tablet by mouth every day Patient taking differently: Take 25 mg by mouth daily as needed (HBP). Take one tablet by mouth daily as needed if SBP > 160 09/20/23  Yes Raford Riggs, MD  MAGNESIUM  GLYCINATE ADVANCED PO Take 200 mg by mouth at bedtime.   Yes [provider]  metFORMIN  (GLUCOPHAGE ) 1000 MG tablet Take 1,000 mg by mouth 2 (two) times daily. 12/25/19  Yes [provider]  midodrine  (PROAMATINE ) 2.5  MG tablet Take 2.5 mg by mouth as needed (for SBP less than 130).   Yes [provider]  naproxen sodium (ALEVE) 220 MG tablet Take 440 mg by mouth in the morning and at bedtime.   Yes [provider]  OZEMPIC, 1 MG/DOSE, 4 MG/3ML SOPN Inject 1 mg into the skin once a week. Inject 1mg  subcutaneously once per week on Tuesday. 05/22/24  Yes [provider]  pantoprazole  (PROTONIX ) 40 MG tablet Take 40 mg by mouth 2 (two) times daily. 07/12/24  Yes [provider]  QUEtiapine  (SEROQUEL ) 300 MG tablet Take 300 mg by mouth at bedtime. 07/12/24  Yes [provider]  rOPINIRole  (REQUIP ) 1 MG tablet Take 1 mg by mouth at bedtime.   Yes [provider]  rosuvastatin  (CRESTOR ) 20 MG tablet Take 20 mg by mouth at bedtime. 07/12/24  Yes [provider]  thiamine  (VITAMIN B-1) 100 MG tablet Take 100 mg by mouth daily.   Yes [provider]    Allergies: Codeine, Lopressor  [metoprolol ], Meperidine, Norco [hydrocodone -acetaminophen ], and Jardiance [empagliflozin]    Review of Systems  Neurological:  Positive for syncope.    Updated Vital Signs BP 117/82   Pulse 78   Temp 98.4 F (36.9 C) (Oral)   Resp (!) 22   Ht 5' 4 (1.626 m)   Wt 69.9 kg   SpO2 96%   BMI 26.43 kg/m   Physical Exam Vitals and nursing note reviewed.  Constitutional:      General: She is not in acute distress.    Appearance: She is well-developed.  HENT:     Head: Normocephalic and atraumatic.  Eyes:     Conjunctiva/sclera: Conjunctivae normal.  Cardiovascular:     Rate and Rhythm: Normal rate and regular rhythm.     Heart sounds: No murmur heard. Pulmonary:     Effort: Pulmonary effort is normal. No respiratory distress.     Breath sounds: Normal breath sounds.  Abdominal:     Palpations: Abdomen is soft.     Tenderness: There is no abdominal tenderness.  Musculoskeletal:        General: No swelling.     Cervical back: Neck supple.  Skin:     General: Skin is warm and dry.     Capillary Refill: Capillary refill takes less than 2 seconds.  Neurological:     Mental Status: She is alert and oriented to person, place, and time. Mental status is at baseline.     Comments: CN III through XII intact.  Intact finger-nose, heel-to-shin.  No pronator drift or slurred speech.  No facial droop.  Equal strength throughout.  Equal sensation throughout.  PERRL.  Tracks cross midline.  Psychiatric:        Mood and Affect: Mood normal.     (all labs ordered are listed, but only abnormal results are displayed) Labs Reviewed  CBC - Abnormal; Notable for the following components:  Result Value   RBC 3.61 (*)    Hemoglobin 11.9 (*)    HCT 35.4 (*)    All other components within normal limits  COMPREHENSIVE METABOLIC PANEL WITH GFR - Abnormal; Notable for the following components:   Glucose, Bld 106 (*)    BUN 24 (*)    Creatinine, Ser 1.51 (*)    Total Protein 6.1 (*)    GFR, Estimated 39 (*)    All other components within normal limits  TSH - Abnormal; Notable for the following components:   TSH 8.800 (*)    All other components within normal limits  MAGNESIUM  - Abnormal; Notable for the following components:   Magnesium  1.6 (*)    All other components within normal limits  TROPONIN I (HIGH SENSITIVITY) - Abnormal; Notable for the following components:   Troponin I (High Sensitivity) 19 (*)    All other components within normal limits  BRAIN NATRIURETIC PEPTIDE  D-DIMER, QUANTITATIVE  T4, FREE  TROPONIN I (HIGH SENSITIVITY)    EKG: None  Radiology: DG Chest 2 View Result Date: 07/24/2024 CLINICAL DATA:  Shortness of breath EXAM: CHEST - 2 VIEW COMPARISON:  11/29/2020 FINDINGS: Cardiac shadow is stable. Pacing device is again seen. The lungs are clear bilaterally. No bony abnormality is noted. IMPRESSION: No active cardiopulmonary disease. Electronically Signed   By: Oneil Devonshire M.D.   On: 07/24/2024 02:03   DG Hip Unilat W  or Wo Pelvis 2-3 Views Left Result Date: 07/24/2024 CLINICAL DATA:  Recent fall with left hip pain, initial encounter EXAM: DG HIP (WITH OR WITHOUT PELVIS) 3V LEFT COMPARISON:  None Available. FINDINGS: Pelvic ring is intact. No acute fracture or dislocation is noted. No soft tissue abnormality is seen. Scattered fecal material is noted throughout the colon consistent with a degree of constipation. IMPRESSION: No acute bony abnormality noted. Findings consistent with colonic constipation. Electronically Signed   By: Oneil Devonshire M.D.   On: 07/24/2024 02:01    Procedures   Medications Ordered in the ED  0.9 %  sodium chloride  infusion ( Intravenous New Bag/Given 07/24/24 0409)    Medical Decision Making Amount and/or Complexity of Data Reviewed Labs: ordered. Radiology: ordered. ECG/medicine tests: ordered.  Risk Decision regarding hospitalization.   This is a 63 year old female with a history of dysautonomia seen by cardiology with pacemaker who presents due to concern of syncope, hypotension and bradycardia.  Patient with longstanding history of the above-stated issues however tonight states that she was lightheaded for much longer than usual, her symptoms were much more intense.  She is unsure if she lost consciousness tonight.  She denies any chest pain or shortness of breath received this event.  She arrives to the ED with reassuring vital signs.  She has no complaints.  She is in no apparent distress.  Chart was reviewed.  Patient called on-call cardiologist who advised patient come to ED.  They recommended CBC, CMP, D-dimer, troponin, BNP as well as IV fluids.  Will also interrogate the patient pacemaker and collect chest x-ray.  Will also add on mag, TSH.  Patient CBC without leukocytosis, baseline hemoglobin.  Patient metabolic panel reveals no electrolyte derangement.  She does have an AKI with a creatinine 1.5 and GFR of 39.  1 year ago creatinine 0.65.  Patient given fluid at this  time.  Her magnesium  is slightly low 1.6.  Her TSH is elevated 8.80 however free T4 within normal limits at 0.81.  Troponin initially 11, delta 19.  BNP 51.8.  D-dimer negative.  Chest x-ray unremarkable.  Plain film imaging of left hip collected due to patient reporting fall recently however unremarkable, negative for acute process.  Discussed with on-call cardiology fellow.  Recommends admission for monitoring, cardiology consult.  Have discussed with hospitalist, Dr. Debby, who agreed to admit patient.  Patient amenable to plan.  Stable on admission.    Final diagnoses:  Dehydration  AKI (acute kidney injury) (HCC)  Syncope, unspecified syncope type    ED Discharge Orders     None          Brittney Cooper 07/24/24 9461    Carita Senior, MD 07/24/24 1635

## 2024-07-24 NOTE — ED Notes (Signed)
 Pt came to nurse station requesting to leave immediately. Pt then removed IV herself in the room. Pt states nothing is happening and I can manage my care at home. This RN explained to pt that cardiology would be coming by to see her and possibly adjust medications and if she leaves it will be against medical advice. Pt verbalizes understanding. Pt refused to have vital signs taken at this time. Pt signed AMA form with a smiley face as witnessed by this RN. MD aware.

## 2024-07-24 NOTE — Progress Notes (Signed)
  Progress Note  Patient Name: Brittney Cooper Date of Encounter: 07/24/2024 [facility] Cardiologist: Annabella Scarce, MD   Interval Summary   Patient reports feeling much better since arrival Tells me that she was working outside yesterday and was likely very dehydrated   Vital Signs Vitals:   07/24/24 0300 07/24/24 0541 07/24/24 0815 07/24/24 0830  BP: 117/82  (!) 133/99 (!) 122/92  Pulse: 78  78 81  Resp: (!) 22  16 14   Temp:  98.3 F (36.8 C)  98.4 F (36.9 C)  TempSrc:  Oral  Oral  SpO2: 96%  100% 98%  Weight:      Height:       No intake or output data in the 24 hours ending 07/24/24 1009    07/24/2024    1:16 AM 03/16/2024   10:07 AM 02/20/2024    3:31 PM  Last 3 Weights  Weight (lbs) 154 lb 169 lb 169 lb 6.4 oz  Weight (kg) 69.854 kg 76.658 kg 76.839 kg     Telemetry/ECG  Sinus rhythm, HR 80s, significant artifact - Personally Reviewed  Physical Exam  GEN: No acute distress.   Neck: No JVD Cardiac: RRR, no murmurs, rubs, or gallops.  Respiratory: Clear to auscultation bilaterally. GI: Soft, nontender, non-distended  MS: No edema  Assessment & Plan  Syncope neurally mediated, recurrent  Dysautonomia Patient was seen by EP 01/2024 History of dysautonomic syncope  Underwent [employer] Eluna 8 DR-T dual-chamber pacemaker implant 09/2016 Had recurrent symptoms post PPM, associated with normal rhythm  She follows with [facility] as outpatient  She reported syncopal episodes as well as bradycardia, of note her pacer is set to kick in at 60 bpm so unlikely that she would have truly fallen into the 30s HR remains 70-80s while admitted  Device interrogation shows some episodes that resemble SVT, occurring around when patient had her symptoms  Patient received IV fluids with good response  Intermittent CHB s/p PPM Device interrogation from 07/05/2024 showed A-sensed, V-sensed, AT episode x 2 26-28 second each, normal device function  Awaiting  device interrogation   Per primary Diabetes Alcohol  dependence  Bipolar disorder Subclinical hypothyroidism  Hyperlipidemia  Anemia  AKI    For questions or updates, please contact  [facility] Please consult [email or web address] for contact info under       Signed, Waddell DELENA Donath, PA-C

## 2024-07-24 NOTE — Discharge Summary (Addendum)
  Physician Discharge Summary   Patient: Brittney Cooper MRN: [id number] DOB: [birth year]  Admit date:     07/24/2024  Discharge date: 07/24/24  Discharge Physician: Maximino DELENA Sharps   PCP: Vivian Barefoot, Lavern Backers, MD  Discharge time spent: less than 30 minutes. Patient left AGAINST MEDICAL ADVICE prior to being able to be formally discharged.  Her symptoms seem to improve with IV fluids.  Cardiology had seen and recommended outpatient f/u with [facility] and will establish her with Dr Adina Primus EP.  See  H&P for other details. Signed: Maximino DELENA Sharps, MD [facility] 07/24/2024

## 2024-07-24 NOTE — Consult Note (Signed)
 Cardiology Consult    Patient ID: Brittney Cooper MRN: [id number]; DOB: [birth year]   Admission date: 07/24/2024  Primary Care Provider: Vivian Barefoot, Lavern Backers, MD Primary Cardiologist: Annabella Scarce, MD  Primary Electrophysiologist:  None   Chief Complaint: Overall unwell  Patient Profile:   Brittney Cooper is a 63 y.o. female with diabetes placated by autonomic dysfunction, syncope  History of Present Illness:   Brittney Cooper states that today she began feeling unwell she was unable to stand. She typically faints about 3 times per week because of her dysautonomia however, today she felt worse. She also stated that she noticed her pulse was in the 30s. She called the on-call cardiology pager and was instructed to come into the emergency room for evaluation. Of note, she has a history of longstanding diabetes which has caused autonomic dysfunction. She has around 3 syncopal events each week. Her dysautonomia is characterized by hypertension and hypotension to which  she is managed with losartan , midodrine , and and bisoprolol .   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   has upper endo with us  with biopsy   Fatty liver    Fracture of lateral malleolus of right fibula 06/2015   GERD (gastroesophageal reflux disease)    history ofHepatomegaly    Hyperlipidemia    Hypertension    Neurocardiogenic syncope    faints 2 x week   Neurocardiogenic syncope 09/13/2022   Neuromuscular disorder (HCC)    Dystonia   OCD (obsessive compulsive  disorder)    OCD (obsessive compulsive disorder)    Osteoporosis    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   Pancreatic pseudocyst    Panic attack    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, working on OB floor as a nurse   Syncope    neurocardiogenic   Third degree heart block (HCC)     Past Surgical History:  Procedure Laterality Date   ABDOMINAL HYSTERECTOMY  2009   ANTERIOR AND POSTERIOR REPAIR  2009   BONE EXCISION Right 07/08/2022   Procedure: PARTIAL FIBULA BONE EXCISION WITH I&D;  Surgeon: Beverley Evalene BIRCH, MD;  Location: [facility];  Service: Orthopedics;  Laterality: Right;   BUNIONECTOMY Bilateral    CESAREAN SECTION     EP IMPLANTABLE DEVICE N/A 10/12/2016   Procedure: Biotronik Pacemaker Implant;  Surgeon: Will Gladis Norton, MD;  Location: [facility];  Service: Cardiovascular;  Laterality: N/A;   finfger surgery Left    left middle fingger   FINGER SURGERY     HARDWARE REMOVAL Right 05/23/2022   Procedure: HARDWARE REMOVAL;  Surgeon: Josefina Chew, MD;  Location: [facility] OR;  Service: Orthopedics;  Laterality: Right;   I & D EXTREMITY Right 07/30/2022   Procedure: PARTIAL EXCISION RIGHT FIBULA;  Surgeon: Harden Jerona GAILS, MD;  Location: [facility] OR;  Service: Orthopedics;  Laterality: Right;   INCISION AND DRAINAGE ABSCESS  Right 05/23/2022   Procedure: INCISION AND DRAINAGE ABSCESS, RIGHT ANKLE;  Surgeon: Josefina Chew, MD;  Location: [facility] OR;  Service: Orthopedics;  Laterality: Right;   LTCS     MYRINGOTOMY  1966   ORIF ANKLE FRACTURE Right 07/25/2015   Procedure: OPEN REDUCTION INTERNAL FIXATION (ORIF) RIGHT ANKLE ;  Surgeon: Evalene JONETTA Chancy, MD;  Location: [facility];  Service: Orthopedics;  Laterality: Right;   RHINOPLASTY     age 35   sub mandibular cyst removal from right side of tongue  2019   TONSILLECTOMY  1966   and adenoids removed   TUBAL LIGATION  2005     Medications Prior to Admission: Prior  to Admission medications   Medication Sig Start Date End Date Taking? Authorizing Provider  ARIPiprazole  (ABILIFY ) 10 MG tablet Take 1 tablet (10 mg total) by mouth daily. 04/23/23   Goli, Veeraindar, MD  atomoxetine  (STRATTERA ) 40 MG capsule Take 1 capsule (40 mg total) by mouth daily. 04/23/23   Larina Fling, MD  bisoprolol  (ZEBETA ) 5 MG tablet Take 1 tablet by mouth every day 06/19/24   Raford Riggs, MD  doxazosin  (CARDURA ) 1 MG tablet TAKE 1 TABLET BY MOUTH AT BEDTIME Patient taking differently: Take 1 mg by mouth daily as needed. 01/19/23   Fernande Elspeth BROCKS, MD  flecainide  (TAMBOCOR ) 50 MG tablet Take 1 tablet (50 mg total) by mouth 2 (two) times daily. Please call [phone] to schedule overdue appointment for further refills. 12/19/23   Raford Riggs, MD  gabapentin  (NEURONTIN ) 300 MG capsule Take 1 capsule (300 mg total) by mouth 2 (two) times daily. 04/22/23   Goli, Veeraindar, MD  glipiZIDE  (GLUCOTROL ) 10 MG tablet Take 1 tablet (10 mg total) by mouth daily before breakfast. 04/23/23   Larina Fling, MD  losartan  (COZAAR ) 25 MG tablet Take 1 tablet by mouth every day 09/20/23   Raford Riggs, MD  metFORMIN  (GLUCOPHAGE ) 1000 MG tablet Take 1,000 mg by mouth 2 (two) times daily. 12/25/19   [provider]  midodrine  (PROAMATINE ) 2.5 MG tablet Take 2.5 mg by mouth as needed (for SBP less than 130).    [provider]  rOPINIRole  (REQUIP ) 1 MG tablet Take 1 tablet (1 mg total) by mouth at bedtime. 04/22/23   Goli, Veeraindar, MD  traZODone  (DESYREL ) 100 MG tablet Take 1 tablet (100 mg total) by mouth at bedtime as needed for sleep. 04/22/23   Larina Fling, MD     Allergies:    Allergies  Allergen Reactions   Codeine Nausea And Vomiting    Pt stated she also thought it made her hallucinate   Demerol [Meperidine] Other (See Comments)    Hallucinations    Lopressor  [Metoprolol ] Other (See Comments)    Nightmares    Norco [Hydrocodone -Acetaminophen ]  Nausea And Vomiting    Pt reports Vicodin caused vomiting for six hours, reports being able to tolerate oxycodone  and tramadol . Can take tylenol    Jardiance [Empagliflozin] Other (See Comments)    Yeast infections    Social History:   Social History   Socioeconomic History   Marital status: Divorced    Spouse name: Not on file   Number of children: Not on file   Years of education: Not on file   Highest education level: Not on file  Occupational History   Occupation: RN  Tobacco Use   Smoking status: Every Day    Current packs/day: 1.00    Average packs/day: 1 pack/day for 15.0 years (15.0 ttl pk-yrs)  Types: Cigarettes   Smokeless tobacco: Never   Tobacco comments:    1/2 -1 ppd  Vaping Use   Vaping status: Former   Substances: Nicotine   Substance and Sexual Activity   Alcohol  use: Yes    Comment: daily   Drug use: No   Sexual activity: Not Currently    Birth control/protection: Surgical, Abstinence    Comment: Hysterectomy  Other Topics Concern   Not on file  Social History Narrative   Not on file   Social Drivers of Health   Financial Resource Strain: High Risk (07/10/2024)   Received from [facility]   Overall Financial Resource Strain (CARDIA)    How hard is it for you to pay for the very basics like food, housing, medical care, and heating?: Very hard  Food Insecurity: Food Insecurity Present (07/10/2024)   Received from [facility]   Hunger Vital Sign    Within the past 12 months, you worried that your food would run out before you got the money to buy more.: Often true    Within the past 12 months, the food you bought just didn't last and you didn't have money to get more.: Often true  Transportation Needs: Unmet Transportation Needs (07/10/2024)   Received from [facility] - Transportation    In the past 12 months, has lack of transportation kept you from medical appointments or from getting medications?: Yes    In the past 12 months,  has lack of transportation kept you from meetings, work, or from getting things needed for daily living?: Yes  Physical Activity: Insufficiently Active (07/10/2024)   Received from [facility]   Exercise Vital Sign    On average, how many days per week do you engage in moderate to strenuous exercise (like a brisk walk)?: 1 day    On average, how many minutes do you engage in exercise at this level?: 20 min  Stress: Stress Concern Present (07/10/2024)   Received from [facility] - Occupational Stress Questionnaire    Do you feel stress - tense, restless, nervous, or anxious, or unable to sleep at night because your mind is troubled all the time - these days?: Very much  Social Connections: Socially Isolated (07/10/2024)   Received from [facility]   Social Network    How would you rate your social network (family, work, friends)?: Little participation, lonely and socially isolated  Intimate Partner Violence: Not At Risk (07/10/2024)   Received from [facility]   HITS    Over the last 12 months how often did your partner physically hurt you?: Never    Over the last 12 months how often did your partner insult you or talk down to you?: Frequently    Over the last 12 months how often did your partner threaten you with physical harm?: Never    Over the last 12 months how often did your partner scream or curse at you?: Rarely    Family History:   The patient's family history includes Colon cancer in her paternal grandfather; Colon polyps in her father and mother; Hyperlipidemia in her brother and sister; Hypertension in her brother, father, mother, and sister; Kidney disease in her mother; Kidney failure in her mother; Lung cancer in her father; Stomach cancer in her maternal grandfather; Stroke in her mother. There is no history of Rectal cancer, Esophageal cancer, or Pancreatic cancer.    Review of Systems: [y] = yes, [ ]  =  no    General: Weight  gain [ ] ; Weight loss [ ] ; Anorexia [ ] ; Fatigue [ ] ; Fever [ ] ; Chills [ ] ; Weakness [ ]   Cardiac: Chest pain/pressure [ ] ; Resting SOB [ ] ; Exertional SOB [ ] ; Orthopnea [ ] ; Pedal Edema [ ] ; Palpitations [ ] ; Syncope [ ] ; Presyncope [ y]; Paroxysmal nocturnal dyspnea[ ]   Pulmonary: Cough [ ] ; Wheezing[ ] ; Hemoptysis[ ] ; Sputum [ ] ; Snoring [ ]   GI: Vomiting[ ] ; Dysphagia[ ] ; Melena[ ] ; Hematochezia [ ] ; Heartburn[ ] ; Abdominal pain [ ] ; Constipation [ ] ; Diarrhea [ ] ; BRBPR [ ]   GU: Hematuria[ ] ; Dysuria [ ] ; Nocturia[ ]   Vascular: Pain in legs with walking [ ] ; Pain in feet with lying flat [ ] ; Non-healing sores [ ] ; Stroke [ ] ; TIA [ ] ; Slurred speech [ ] ;  Neuro: Headaches[ ] ; Vertigo[ ] ; Seizures[ ] ; Paresthesias[ ] ;Blurred vision [ ] ; Diplopia [ ] ; Vision changes [ ]   Ortho/Skin: Arthritis [ ] ; Joint pain [ ] ; Muscle pain [ ] ; Joint swelling [ ] ; Back Pain [ ] ; Rash [ ]   Psych: Depression[ ] ; Anxiety[ ]   Heme: Bleeding problems [ ] ; Clotting disorders [ ] ; Anemia [ ]   Endocrine: Diabetes [ ] ; Thyroid  dysfunction[ ]   Physical Exam/Data:   Vitals:   07/24/24 0116  Weight: 69.9 kg  Height: 5' 4 (1.626 m)   No intake or output data in the 24 hours ending 07/24/24 0208 Filed Weights   07/24/24 0116  Weight: 69.9 kg   Body mass index is 26.43 kg/m.  General:  Well nourished, well developed, in no acute distress HEENT: normal Neck: JVP 5 cm Vascular: No carotid bruits; FA pulses 2+ bilaterally without bruits  Cardiac:  normal S1, S2; RRR; no murmur  Lungs:  clear to auscultation bilaterally, no wheezing, rhonchi or rales  Abd: soft, nontender, no hepatomegaly  Ext: no edema Musculoskeletal:  No deformities, BUE and BLE strength normal and equal Skin: warm and dry  Psych:  Normal affect    EKG: EKG reviewed from 07/24/2024 at 2:31 AM.  There is significant artifact in several of the leads however in the remainder of the leads she is in normal sinus rhythm with a prolonged QTc.   No signs of ischemia.  Laboratory Data:  ChemistryNo results for input(s): NA, K, CL, CO2, GLUCOSE, BUN, CREATININE, CALCIUM , GFRNONAA, GFRAA, ANIONGAP in the last 168 hours.  No results for input(s): PROT, ALBUMIN, AST, ALT, ALKPHOS, BILITOT in the last 168 hours. Hematology Recent Labs  Lab 07/24/24 0118  WBC 6.7  RBC 3.61*  HGB 11.9*  HCT 35.4*  MCV 98.1  MCH 33.0  MCHC 33.6  RDW 13.2  PLT 220   Cardiac EnzymesNo results for input(s): TROPONINI in the last 168 hours. No results for input(s): TROPIPOC in the last 168 hours.  BNPNo results for input(s): BNP, PROBNP in the last 168 hours.  DDimer No results for input(s): DDIMER in the last 168 hours.  Radiology/Studies:  DG Chest 2 View Result Date: 07/24/2024 CLINICAL DATA:  Shortness of breath EXAM: CHEST - 2 VIEW COMPARISON:  11/29/2020 FINDINGS: Cardiac shadow is stable. Pacing device is again seen. The lungs are clear bilaterally. No bony abnormality is noted. IMPRESSION: No active cardiopulmonary disease. Electronically Signed   By: Oneil Devonshire M.D.   On: 07/24/2024 02:03   DG Hip Unilat W or Wo Pelvis 2-3 Views Left Result Date: 07/24/2024 CLINICAL DATA:  Recent fall with left hip pain, initial encounter EXAM:  DG HIP (WITH OR WITHOUT PELVIS) 3V LEFT COMPARISON:  None Available. FINDINGS: Pelvic ring is intact. No acute fracture or dislocation is noted. No soft tissue abnormality is seen. Scattered fecal material is noted throughout the colon consistent with a degree of constipation. IMPRESSION: No acute bony abnormality noted. Findings consistent with colonic constipation. Electronically Signed   By: Oneil Devonshire M.D.   On: 07/24/2024 02:01    Assessment and Plan:   Mrs. Bouley is a 63 year old with autonomic dysfunction secondary to longstanding diabetes presents to the ED with hypotension, which resolved following a fluid bolus. Per both the patient and EMS, she experienced a  pulse in the 30s. She is currently in normal sinus rhythm with a prolonged QTc. Device interrogation reveals many episodes of junctional tachycardia. She appears volume-depleted; recommend continued fluid resuscitation. Once euvolemia is achieved, we can consider uptitrating her bisoprolol . Please administer her home dose of midodrine  for hypotension tonight.  Plan is for admission to Medicine for management of hypovolemia and further arrhythmia evaluation.  Signed, Merlene JAYSON Blood, MD  07/24/2024 2:08 AM

## 2024-07-24 NOTE — ED Triage Notes (Signed)
 Pt BIB EMS from home. Pt reports her HR and BP has been low today. Has a non-demand pacemaker (hx Cardiac arrest). Upon EMS arrival pt HR was in the 50s, BP 80s systolic, but in route her pacemaker kicked in and is now in the 80s. 700NS given, BP now stable. Pt also states that she has had 3 syncopal episodes throughout the week. GCS 15.

## 2024-07-25 ENCOUNTER — Telehealth: Payer: Self-pay | Admitting: Cardiology

## 2024-07-25 NOTE — Progress Notes (Signed)
 [facility] Transitional Care Management     Situation   Brittney Cooper is a 63 y.o. female who was contacted today for a transitional care outreach.  Admission Date: 07/24/24  Discharge Date: 07/24/24   Institution: Brittney [facility]  Diagnosis:  Dehydration   Is this visit eligible for TCM? Yes  Background   Since Discharge: pt reports her blood pressure and pulse are still low. She reports she thinks something is wrong with her heart. She reports her blood pressure today was 82/58 and pulse 58 this morning. She reports she is having some chest pain. She reports she had a panic attack in the hospital--but she doesn't think this is anxiety related because she took anxiety medication today. She reports she is a Engineer, civil (consulting). She reports she is supposed to see cardiology tomorrow at 8am--but she thinks she needs to go back to the hospital. She reports someone told her that her insurance wouldn't pay for her to stay in the hospital overnight--so that is why she left AMA. She reports she has since found out this was incorrect information. I encouraged her to go back to the hospital based on her symptoms. She reported she didn't want to wait in the emergency room again. She asked me to ask PCP if she could direct admit her to [facility]. I sent PCP a message to address. I called the complex care clinic to see if they could see the patient today--but they don't have any openings. I encouraged the patient to go back to the hospital--because I wasn't sure how long it would take PCP to respond to my message. She reports she will do this.   Primary Care Provider on Record: Brittney CINDERELLA Romney, MD   Assessment    General Assessment     Type of Visit Telephone   Assessment Completed With Patient   Interpreter Used No   Preferred Language English   Living Arrangement Children   Support System Children   Home Care Services No   Fall History Yes      Flowsheet Row Most Recent Value  Engagement    Call number 1  Two calls made/Contact successful Yes  Is the patient eligible? Yes  HIPAA Verified Yes  Medications   Medications reviewed with patient/caregiver? No * [unable to address at this time]  Does the patient have all medications ordered at discharge? Yes  Nursing Interventions No intervention needed  Appointments   Does the patient have a primary care provider? Yes  Does the patient have an appointment with their PCP within 7 days of discharge? No  What is preventing the patient from scheduling follow up appointments within 7 days of discharge? -- * [I encouraged the patient to go back to the hosital due to her low blood pressure, heart rate, and chest pain issues]  Nursing Interventions Urgent appointment needed  Urgent Appointment Interventions Instructed to call Emergency Medical Services * [I checked with the complex care clinic--but they didn't have any openings today]  Self Management   Patient Teaching   Wrap Up   Two calls made/Contact successful Yes      Recommendation    PCP/specialist notified: Yes  Referral Made: No  Referrals made to other disciplines: None   Future Appointments  Date Time Provider Department Center  08/14/2024  1:00 PM [facility] DEXA1 [facility] RAD [facility]  08/14/2024  1:30 PM [facility] CT1 [facility] RAD CT [facility]  08/16/2024  1:00 PM Fairy Toribio Mody, MD [facility] PMR  LIN None  08/28/2024  7:30 AM Selinda JONETTA Southgate, MD [facility] ENDO [facility]  11/26/2024 10:10 AM Autumn Reggy Molt, PA-C [facility] END CN None        Electronically signed by: Brittney JINNY Bring, RN 07/25/2024 11:34 AM    *Some images could not be shown.

## 2024-07-25 NOTE — Telephone Encounter (Signed)
 Patient called in asking for direct admission. Was seen at [facility] yesterday with syncope and concerns regarding her HR. Per notes, device interrogation showed normal function with LLR set at 60bpm. Seen by Dr. Delford and deemed ok for discharge with outpatient follow up with [facility] clinic and EP. Appears she signed out AMA. Reports she would like more answers and requesting direct admit. I advised, I'm unable to do a direct admit given her being under the care on hospitalist team and cleared by cardiology for DC home. Would need to present to the ED for re-evaluation if needed. She asked about being seen in the office, advised she could reach back out to the office once opened to determine if availably to see. She voiced understanding and thanked me for call back.

## 2024-07-26 ENCOUNTER — Encounter (HOSPITAL_BASED_OUTPATIENT_CLINIC_OR_DEPARTMENT_OTHER): Payer: Self-pay | Admitting: Nurse Practitioner

## 2024-07-26 ENCOUNTER — Telehealth: Payer: Self-pay | Admitting: Nurse Practitioner

## 2024-07-26 ENCOUNTER — Ambulatory Visit (HOSPITAL_BASED_OUTPATIENT_CLINIC_OR_DEPARTMENT_OTHER): Admitting: Nurse Practitioner

## 2024-07-26 ENCOUNTER — Encounter (HOSPITAL_BASED_OUTPATIENT_CLINIC_OR_DEPARTMENT_OTHER): Payer: Self-pay

## 2024-07-26 VITALS — BP 132/90 | HR 86 | Ht 64.0 in | Wt 159.2 lb

## 2024-07-26 DIAGNOSIS — Z950 Presence of cardiac pacemaker: Secondary | ICD-10-CM

## 2024-07-26 DIAGNOSIS — R0989 Other specified symptoms and signs involving the circulatory and respiratory systems: Secondary | ICD-10-CM

## 2024-07-26 DIAGNOSIS — R55 Syncope and collapse: Secondary | ICD-10-CM | POA: Diagnosis not present

## 2024-07-26 DIAGNOSIS — I10 Essential (primary) hypertension: Secondary | ICD-10-CM

## 2024-07-26 DIAGNOSIS — Z720 Tobacco use: Secondary | ICD-10-CM

## 2024-07-26 DIAGNOSIS — I4719 Other supraventricular tachycardia: Secondary | ICD-10-CM | POA: Diagnosis not present

## 2024-07-26 DIAGNOSIS — G901 Familial dysautonomia [Riley-Day]: Secondary | ICD-10-CM | POA: Diagnosis not present

## 2024-07-26 DIAGNOSIS — F1021 Alcohol dependence, in remission: Secondary | ICD-10-CM

## 2024-07-26 DIAGNOSIS — I442 Atrioventricular block, complete: Secondary | ICD-10-CM

## 2024-07-26 MED ORDER — BISOPROLOL FUMARATE 5 MG PO TABS: 5.0000 mg | ORAL_TABLET | Freq: Every day | ORAL | 3 refills | Status: AC

## 2024-07-26 NOTE — Telephone Encounter (Signed)
 Pt has Biotronik pacemaker that transmits nightly.  She atrially paces 45% of the time with rate response on.  Only paces in the ventricle 1% of the time.  Pt is not dependent on device.  Per review of histograms, does not appear that Pt's heart rate was 30 bpm.  See chart below.  Pt does have episodes of SVT.

## 2024-07-26 NOTE — Telephone Encounter (Signed)
 I saw this pt this morning. She was in ED on 07/24/24 and had device download performed. She says there was concern from one of the physicians and initially she was told she was going to be kept overnight, but then later was told she would be discharged.  I could not find any documentation of what the concern was.  She reported that her heart rate was in the 30s and 50s prior to arriving in the ED, but Dr. Delford was skeptical given PPM settings. Could you please see if there is any information available to you that we could share with her.   Thank you, Brittney Cooper

## 2024-07-26 NOTE — Progress Notes (Signed)
 Cardiology Office Note   Date:  07/26/2024  ID:  Brittney, Cooper February 11, 1961, MRN [id number] PCP: Andrew Truman GRADE., MD  [facility] Providers Cardiologist:  Annabella Scarce, MD     Scnetx Complete heart block s/p PPM implant 09/2016 Dysautonomic syncope Hypertension EtOH related pancreatitis Tobacco abuse Atrial tachycardia  Cardiac monitor in 2016 following an episode of syncope revealed intermittent complete heart block and 6-second pauses.  Biotronik pacemaker was implanted. She wore an ambulatory monitor 11/2019 following a syncopal event noted to be in sinus rhythm.  Initially seen in advanced hypertension clinic 12/08/2021.  For the previous 3 years she had very labile hypertension and was taking midodrine  and hydralazine  as needed.  She had had some falls and syncope.  Renal artery Dopplers normal 08/2020. Blood pressure was labile. She called the office to report syncope 03/2021.  She was advised to stop hydralazine  and midodrine  and start bisoprolol  and doxazosin .  BP improved.  Lab testing negative for pheochromocytoma and hyperaldosteronism, thyroid  function was normal.  Last visit with Dr. Scarce was 09/13/22 at which time she frequently suffered from fractures due to falls.  She lost consciousness 2 times in the prior week.  BP ranging from 1 20-160 systolic.  She continued to smoke 1 PPD.  No longer consuming EtOH.  She was advised to wear compression socks when SBP < 120 and had been referred to [facility] for dysautonomia.  She had a titration schedule for midodrine , bisoprolol  and losartan .  Atrial tachycardia was well-controlled on flecainide .  Last cardiology clinic visit was 02/20/2024 with Dr. Fernande.  Device download revealed a pacing at 90, normal device function, no changes were made.  Plan to maintain aggressive CLS pacing.  She is followed by [facility] autonomic clinic.  Exercise Myoview  was ordered for evaluation of chest pain and revealed no evidence of ischemia,  normal heart function.  Admission 07/24/24 with syncope.  She noted her pulse to be in 30s bpm.  She reported approximately 3 syncopal events per week.  She continued to be managed with losartan , midodrine , and bisoprolol .  Consulted by cardiology.  Felt to be dehydrated from working outside.  Device interrogation revealed some episodes that resemble SVT.  Pacer lower limits set at 60 bpm, likely that pulse truly fell into the 30s.  History of Present Illness Discussed the use of AI scribe software for clinical note transcription with the patient, who gave verbal consent to proceed. History of Present Illness Brittney Cooper is a very pleasant 63 year old female who is here today for follow-up of recent concerns with pacemaker function. She went to ED on 07/24/2024 following significant lightheadedness and syncope.  She reports the physician was very concerned about the pacemaker report that was printed out while she was in the hospital. She reports significant lightheadedness on the days prior to ED visit on 7/29, unusual and that it was occurring after standing for a long time.  No lightheadedness today.  She continues to have fluctuations in BP and takes medications based on BP readings.  She took losartan  this morning prior to arrival. Feels that she has less warning time prior to passing out, only about 1 second where she previously had 3 or more seconds and could get herself on the ground.  Various treatments for dysautonomia, including compression stockings, an abdominal binder, and increased salt and water intake, have been tried with uncertain effectiveness. She uses a service dog trained to alert her to changes in her condition and  monitors her blood pressure at home, noting significant fluctuations from 80/40 to 226/168. She takes losartan  and bisoprolol  for blood pressure management, as well as meclizine for hypotension. She denies palpitations or chest discomfort during episodes of lightheadedness.  No SOB, orthopnea, or PND. She has difficulty recognizing changes in heart rate and blood sugar levels. Admits she does not eat consistently, often eating only 1 or 2 meals per day. When she helped her sister post knee surgery, she ate more consistently and felt better.  She has not had any alcohol  in 10 months, but unfortunately continues to smoke.   ROS: See HPI  Studies Reviewed       No results found for: LIPOA  Risk Assessment/Calculations   HYPERTENSION CONTROL Vitals:   07/26/24 0815 07/26/24 0837  BP: (!) 128/100 (!) 132/90    The patient's blood pressure is elevated above target today.  In order to address the patient's elevated BP: Blood pressure will be monitored at home to determine if medication changes need to be made.          Physical Exam VS:  BP (!) 132/90   Pulse 86   Ht 5' 4 (1.626 m)   Wt 159 lb 3.2 oz (72.2 kg)   SpO2 98%   BMI 27.33 kg/m    Wt Readings from Last 3 Encounters:  07/26/24 159 lb 3.2 oz (72.2 kg)  07/24/24 154 lb (69.9 kg)  03/16/24 169 lb (76.7 kg)    GEN: Well nourished, well developed in no acute distress NECK: No JVD; No carotid bruits CARDIAC: RRR, no murmurs, rubs, gallops RESPIRATORY:  Clear to auscultation without rales, wheezing or rhonchi  ABDOMEN: Soft, non-tender, non-distended EXTREMITIES:  No edema; No deformity   Assessment & Plan Dysautonomia with recurrent syncope  Frequent episodes of syncope and orthostatic hypotension felt likely due to dysautonomia. Has been followed by [facility]. Lightheadedness and dizziness typically precede syncope, however she feels she is getting less notice.  Reports previous interventions ineffective. Admits that she does not eat regularly and does not hydrate well. -Work on eating a well balanced diet and hydrate regularly throughout the day - Avoid dehydration, change positions slowly -Start seated exercises like recumbent bike or NuStep -Order a carotid ultrasound  to evaluate the carotid arteries  Labile blood pressure BP continues to fluctuate with significant differences today on manual readings compared to her home monitor. Manual: left arm 112/92, right 132/90 Home monitor: left: 150/110, right 151/110 Encouraged that she replace her batteries and follow guidelines for appropriate BP monitoring.  -Anti-hypertensive med regimen:  If SBP < 130 midodrine  2.5 mg BID  If SBP 130-140 midodrine  2.5 mg, bisoprolol   If SBP 140-159 midodrine , bisoprolol  and losartan   If SBP > 160 bisoprolol  and losartan  - Report BP readings in 2 weeks -SCr elevated at 1.51 on 7/29, felt to be secondary to dehydration -Increase hydration  Complete heart block, status post pacemaker implant  She is concerned about the device report from admission 07/24/24, however I am not able to locate this document. Recent remote device interrogation from 7/10 showed 2 runs of atrial tachycardia lasting 28 sec.  -Message sent to device team to review the pacemaker interrogation report from the recent hospital visit -Has new pt appointment with Dr. Almetta, EP physician in light of Dr. Celine retirement  Paroxysmal atrial tachycardia   Atrial tachycardia was noted on pacemaker interrogation.  No tachypalpitations noted. -Continue flecainide  50 mg twice daily -Magnesium  1.6 on 07/24/2024, advised to  start supplementation - Keep appointment in September with the EP cardiologist  Alcohol  dependence No EToh in 10 months. I congratulated her on this achievement.  Nicotine  dependence, cigarettes   Ongoing nicotine  dependence was discussed, highlighting the health benefits of cessation and strategies to quit.  -Complete cessation advised -Recommended quit date and/or assistance of medication or patches          Dispo: Keep your September appointment with Dr. Almetta  Signed, Rosaline Bane, NP-C

## 2024-07-26 NOTE — Patient Instructions (Signed)
 Medication Instructions:   Your physician recommends that you continue on your current medications as directed. Please refer to the Current Medication list given to you today.   *If you need a refill on your cardiac medications before your next appointment, please call your pharmacy*  Lab Work:  None ordered.  If you have labs (blood work) drawn today and your tests are completely normal, you will receive your results only by: MyChart Message (if you have MyChart) OR A paper copy in the mail If you have any lab test that is abnormal or we need to change your treatment, we will call you to review the results.  Testing/Procedures:  Your physician has requested that you have a carotid duplex. This test is an ultrasound of the carotid arteries in your neck. It looks at blood flow through these arteries that supply the brain with blood. Allow one hour for this exam. There are no restrictions or special instructions.   Follow-Up: At [facility], you and your health needs are our priority.  As part of our continuing mission to provide you with exceptional heart care, our providers are all part of one team.  This team includes your primary Cardiologist (physician) and Advanced Practice Providers or APPs (Physician Assistants and Nurse Practitioners) who all work together to provide you with the care you need, when you need it.  Your next appointment:   1 month(s)  Provider:   Donnice Primus, MD    We recommend signing up for the patient portal called MyChart.  Sign up information is provided on this After Visit Summary.  MyChart is used to connect with patients for Virtual Visits (Telemedicine).  Patients are able to view lab/test results, encounter notes, upcoming appointments, etc.  Non-urgent messages can be sent to your provider as well.   To learn more about what you can do with MyChart, go to [email or web address].   Other Instructions  HOW TO TAKE YOUR BLOOD  PRESSURE  Rest 5 minutes before taking your blood pressure. Don't  smoke or drink caffeinated beverages for at least 30 minutes before. Take your blood pressure before (not after) you eat. Sit comfortably with your back supported and both feet on the floor ( don't cross your legs). Elevate your arm to heart level on a table or a desk. Use the proper sized cuff.  It should fit smoothly and snugly around your bare upper arm.  There should be  Enough room to slip a fingertip under the cuff.  The bottom edge of the cuff should be 1 inch above the crease Of the elbow. Please monitor your blood pressure once daily 2 hours after your am medication. Your goal for your blood pressure 130 (systolic) top number or over 80 ( diastolic) bottom number or below.  ----Avoid cold medicines with D or DM at the end of them----  Tackling Obesity with Lifestyle Changes  Obesity- What is it? And What can we do about it?  Obesity is a chronic complex disease defined as excessive fat deposits that can have a negative effect on our health. It can lead to many other diseases including type 2 diabetes.  Weight gain occurs when the amount of energy (calories) we consume is greater than the amount we use.  When our energy output is greater than our energy input we lose weight. The basic concept is simple, but in reality, it's much more complicated.  Unfortunately, in some people, our bodies have many ways it can compensate when we  try to eat less and move more which can prevent us  from changing our weight. This can lead to some people having a much more difficult time losing weight even when they put healthy habits into practice. This can be frustrating. We want to focus on healthy habits, physical activity and how we feel, and less the number on the scale.  Food As Energy  Calories  Calories is just a unit of measurement for energy.  Counting calories is not required to lose weight but counting for a short period  of time can:   help you learn good portion sizes   Learn what your true energy needs are.   Help you be more aware of your snacking or grazing habits  To help calculate how many calories you should be eating, the NIH has a great body weight planner calculator at BeverageBuggy.si  Types of Energy Expenditure  Basal Metabolic Rate (BMR) Energy that our bodies use to preform everyday tasks. More muscle mass through resistance training can increase this a small amount  Thermic Effect of Food The amount of energy that it takes to breakdown the food we eat. This will be highest when we eat protein and fiber rich foods  Exercise Energy Expenditure The amount of energy used during formal exercise (walking, biking, weightlifting)  Non-exercise activity thermogenesis (NEAT) The amount of energy spent on activities that are not formal exercise (standing, fidgeting). Therefore, it is not only important to do formal exercise but also move around throughout the day.  Managing The Meal  Macro nutrients (carbohydrates, fats and protein, fiber, water)  Micronutrients (vitamins, minerals)  Dietary Fiber  Benefits Examples Cautions  Soluble fiber  Decreases cholesterol  improve blood sugar control,  Feeds our gut bacteria  Allows us  to feel fuller for longer so we eat less  fruits  oats  barley  legumes  peas  Beans  vegetables (broccoli) and root vegetables (carrots) Add fiber into your diet slowly and be sure to drink at least 8 cups of water a day. This will help limit gas, bloating, diarrhea, or constipation.  Insoluble Fiber  Improves digestive health by making stool easier to pass  Allows us  to feel fuller for longer so we eat less  whole grains  nuts  seeds  skin of fruit  vegetables (green beans, zucchini, cauliflower)  Tricks to add more fiber to your diet   Add beans (pinto, kidney, lima, navy and garbanzo) to salads, ground meat or brown rice   Add nuts or  seeds and or fresh/frozen fruit to yogurt, cottage cheese, salads or steel cut oats   Cut up vegetables and eat with hummus   Look for unsweetened whole grain cereals with at least 5g of fiber per serving   Switch to whole grain bread. Look for bread that has whole grain flour as the first ingredient and has more fiber than carbs if you were to multiple the fiber x 10.   Try bulgar, barely, quinoa, buckwheat, brown rice wild rice instead of white rice   Keep frozen vegetables on hand to add to dishes or soups  Meal Planning:  Meal planning is the key to setting you up for success. Here are some examples of healthy meal options.  Breakfast  Option 1: Omelette with vegetables (1 egg, spinach, mushrooms, or other vegetable of your choice), 2 slices whole-grain toast, tip of thumb size butter or soft margarine,  cup low-fat milk or yogurt  Option 2: steel-cut rolled oats (?  cup dry), 1 tbsp peanut butter added to cooked oats,  cup low-fat milk.  Option 3: 2 slices whole-grain or rye toast with avocado spread ( small avocado mased with herbs and pepper to taste), 1 poached egg or s[place] up (cooked to your liking)  Option 4:  cup plain 0% [place] yogurt topped with  cup berries and  cup walnuts or almonds, 2 slices whole-grain or rye toast, tip of thumb size soft margarine/butter  Lunch:  Option 1: 2 cups red lentil soup, green salad with 1 tbsp homemade vinaigrette (extra virgin olive oil and vinegar of choice plus spices)  Option 2: 3 oz. roasted chicken, 2 slices whole-grain bread, 2 tsp mayonnaise, mustard, lettuce, tomato if desired, 1 fruit (example: medium-sized apple or small pear)  Option 3: 3 oz. tuna packed in water, 1 whole-wheat pita (6 inch), 2 tsp mayonnaise, lettuce, tomato, or other non-starchy vegetable of your choice, 1 fruit (example: medium-sized apple or small pear)  Option 4: 1 serving of garden veggie buddha bowl with lentils and tahini sauce and 1 cup  berries topped with  cup plain 0% Greek yogurt  Dinner:  Option 1: 1 serving roasted cauliflower salad, 3-4 oz. grilled or baked pork loin chop, 1/2 cup mashed potato, or brown rice or quinoa  Option 2: 1 serving fish (baked, grilled or air fried), green salad, 1 tbsp homemade vinaigrette,  cup cooked couscous  Option 3: 1 cup cooked whole grained pasta (example: spaghetti, spirals, macaroni),  cup favorite pasta sauce (preferably homemade), 3-4 oz. grilled or baked chicken, green salad, 1 tbsp homemade vinaigrette  Option 4: 1 serving oven roasted salmon,  cup mashed sweet potato or couscous or brown rice or quinoa, broccoli (steamed or roasted)  Healthy snacks:   Carrots or celery with 1 tbsp of hummus   1 medium-sized fruit (apple or orange)   1 cup plain 0% [place] yogurt with  cup berries   Half apple, sliced, with 1 tbsp (15 mL) peanut or almond butter  Dining out:  Eating away from home has become a part of many people's lifestyle. Making healthy choices when you are eating out is important too. Portion size is an important part of healthy choices. Most branded fast-food places provide calories, sodium, and fat content for their menu items. www.calorieking.com would be great resource to find nutrition facts for your favorite brands and fast-food restaurants. Company specific website can be Chief Technology Officer for nutrition information for their items. (e.g. www.mcdonalds.com or www.nutritionix.com/[place]/menu/premium)  Here are some tips to help you make wise food choices when you are dining out.  Chose more often Avoid  Beverages   Choose more often: Water, low fat milk  Sugar-free/diet drinks  Unsweet tea or coffee    Avoid: Milkshakes, fruit drinks, regular pop  Alcohol , specialty drinks (e.g. iced cappuccino)  Fast food  Choose more often:  Garden salad  Mini subs, pita sandwiches ect with extra vegetables  plain burgers, grilled chicken  Vegetarian or  cheese pizza with whole-grain crust    Avoid: Burgers/sandwiches with bacon, cheese, and high-fat sauces  [place] fries, fried chicken, fried fish, poutine, hash browns  Pizza with processed meats  Starters   Choose more often: Raw vegetables, salads (garden, spinach, fruit)  clear or vegetable soups  Seafood cocktail  Whole-grain breads and rolls    Avoid: Salads with high-fat dressings or toppings  Creamy soups  Wings, egg rolls  onion rings, nachos  White or garlic bread  Main courses Grains &  Starches (amount equal to  of your plate)  Choose more often:  Oatmeal, high-fiber/lower-sugar cereals  Whole-grain breads, rice, pasta, barley, couscous  Sweet potatoes    Avoid: Sugary, low-fiber cereals  Large bagels, muffins, croissants, white bread  [place] fries, hash browns, fried rice   Meat and alternative (amount equal to  of your plate)  Choose more often:  Lean meats, poultry, fish, eggs, low-fat cheese  Tofu, vegetable protein Legumes (e.g. lentils, chickpeas, beans)    Avoid: High-salt and/or high-fat meats (e.g. ribs, wings, sausages, wieners, processed lunch meats, imposter meats)   Vegetables (amount equal to  of your plate)  Choose more often:  Salads ([place], garden, spinach), plain vegetables   Avoid:  Salads with creamy, high-fat dressings and   Vegetables on sandwiches ect toppings like bacon bits, croutons, cheese  Desserts  Choose more often:  Fresh fruit, frozen yogourt, skim milk latte    Avoid: Cakes, pies, pastries, ice cream, cheesecake          Postural Orthostatic Tachycardia Syndrome:  We spent time today discussing the etiology, diagnosis, and symptom management of POTS. The following recommendations were emphasized:  -avoid dehydration. Often it requires high volumes of fluids, often with salt/electrolytes included, to stay hydrated. People with POTS are very sensitive to fluid shifts and dehydration. Oral rehydration is  preferred, and routine use of IV fluids is not recommended. -if tolerated, compression garments can assist with fluid management and prevent blood pooling. Compression stockings are a start, but some people need thigh high compression stockings and abdominal compression to help manage symptoms. -slow position changes are recommended -if there is a feeling of severe lightheadedness, like near to passing out, recommend lying on the floor on the back, with legs elevated up on a chair or up against the wall. -the best long term management of POTS symptoms is gradual exercise conditioning. I recommend seated exercises such as bike to start, to avoid the risk of falling with lightheadedness. Exercise programs, either through supervised programs like cardiac rehab or through personal programs, should focus on gradually increasing exercise tolerance and conditioning.  -this is a link to specific exercise recommendations for POTS: http://peterson-powell.net/ -we discussed the typical spectrum of dysautonomia, including typical populations, that this sometimes spontaneously improves with age (though a small percentage have persistent symptoms), that this has uncomfortable symptoms but is not associated with long term mortality, and that the etiology/treatment of this is an area of active research -we discussed that there are many resources available on the internet, but much of this information is not researched based. I recommend kdxobr.com as a reliable web site for information on POTS.

## 2024-08-21 ENCOUNTER — Ambulatory Visit (HOSPITAL_BASED_OUTPATIENT_CLINIC_OR_DEPARTMENT_OTHER)

## 2024-08-21 DIAGNOSIS — G901 Familial dysautonomia [Riley-Day]: Secondary | ICD-10-CM

## 2024-08-21 DIAGNOSIS — R55 Syncope and collapse: Secondary | ICD-10-CM

## 2024-08-21 DIAGNOSIS — I10 Essential (primary) hypertension: Secondary | ICD-10-CM

## 2024-08-21 DIAGNOSIS — I4719 Other supraventricular tachycardia: Secondary | ICD-10-CM

## 2024-08-24 ENCOUNTER — Ambulatory Visit: Payer: Self-pay | Admitting: Nurse Practitioner

## 2024-09-16 NOTE — Progress Notes (Deleted)
  Cardiology Office Note   Date:  09/16/2024  ID:  Niah, Heinle 07-15-1961, MRN [id number] PCP: Andrew Truman GRADE., MD  [facility] Providers Cardiologist:  Annabella Scarce, MD Electrophysiologist:  Donnice DELENA Primus, MD  (prior Dr. Fernande)   History of Present Illness Brittney Cooper is a 63 y.o. female with dysautonomia with associated syncope s/p LEFT BT DC CLS PPM (DOI 10/12/16, Dr. 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  She was last seen by Dr. Fernande on 02/20/2024 and was overall doing relatively well.  She was AP at 89.  She has now followed by Dr. Neville with the [facility].  She was prescribed bisoprolol  daily, midodrine  as needed if SBP less than 130.  She had TTT on 10/19/2023 without any induced vasovagal syncope.  Q sweat was with decreased postganglionic sympathetic axon function but otherwise the test was unremarkable.  Labs only notable for significant hypomagnesemia (1.4).   ROS: ***  Studies Reviewed  ECG review 07/24/24: NSR 81, PR 191, QRS 94, QT/c 491/570, artifact throughout 02/20/24: AP/VS 90, PR 226, QRS 92, QT/c 326/398        *** Risk Assessment/Calculations  No BP recorded.  {Refresh Note OR Click here to enter BP  :1}***       Physical Exam VS:  There were no vitals taken for this visit.       Wt Readings from Last 3 Encounters:  07/26/24 159 lb 3.2 oz (72.2 kg)  07/24/24 154 lb (69.9 kg)  03/16/24 169 lb (76.7 kg)    GEN: Well nourished, well developed in no acute distress NECK: No JVD; No carotid bruits CARDIAC: ***RRR, no murmurs, rubs, gallops RESPIRATORY:  Clear to auscultation without rales, wheezing or rhonchi  ABDOMEN: Soft, non-tender, non-distended EXTREMITIES:  No edema; No  deformity   Device Information PPM: BT Eluna 8 DRT [id number], SN [id number], DOI 10/12/16 RA: BT Selia [id number], SN [id number], DOI 10/12/16 RV: VT Selia [id number], SN [id number], DOI 10/12/16  Device Interrogation Result date: 09/17/24  ASSESSMENT AND PLAN Brittney Cooper is a 63 y.o. female with dysautonomia with associated syncope s/p LEFT BT DC CLS PPM (DOI 10/12/16, Dr. 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 ***    {Are you ordering a CV Procedure (e.g. stress test, cath, DCCV, TEE, etc)?   Press F2        :[id number]}  Dispo: ***  Signed, Donnice DELENA Primus, MD

## 2024-09-17 ENCOUNTER — Ambulatory Visit
Attending: Student in an Organized Health Care Education/Training Program | Admitting: Student in an Organized Health Care Education/Training Program

## 2024-09-18 ENCOUNTER — Other Ambulatory Visit: Payer: Self-pay

## 2024-09-18 ENCOUNTER — Encounter: Payer: Self-pay | Admitting: Student in an Organized Health Care Education/Training Program

## 2024-09-18 ENCOUNTER — Ambulatory Visit (INDEPENDENT_AMBULATORY_CARE_PROVIDER_SITE_OTHER): Admitting: Physician Assistant

## 2024-09-18 DIAGNOSIS — M86271 Subacute osteomyelitis, right ankle and foot: Secondary | ICD-10-CM

## 2024-09-18 DIAGNOSIS — M25571 Pain in right ankle and joints of right foot: Secondary | ICD-10-CM | POA: Diagnosis not present

## 2024-09-18 NOTE — Progress Notes (Signed)
 Office Visit Note   Patient: Brittney Cooper           Date of Birth: 02/07/1961           MRN: [id number] Visit Date: 09/18/2024              Requested by: Andrew Truman GRADE., MD [address] [place],  [place] [address] PCP: Andrew Truman GRADE., MD  Chief Complaint  Patient presents with   Right Ankle - Pain      HPI: 63 y/o female with a history of ankle fracture requiring ORIF lateral malleolus 07/25/2015 by Dr. Velinda Chancy.  She was seen by Dr. Josefina on 05/23/2022 with a right ankle abscess.  He performed Excisional debridement, skin, subcutaneous tissue, bone, with debridement of fibular osteomyelitis,, right ankle, with removal of hardware.  She was then treated with PICC line and IV antibiotics.    She returned to the OR with Dr. Chancy on 06/26/22 Osteomyelitis and he performed  PARTIAL FIBULA BONE EXCISION WITH I&D.   On the last visit to the OR with DR. Duda on 07/30/2022 he performed partial excision of the right fibula, Kerecis application and incisional wound vac.    She has wound healing issue with incision dehiscence and had re application of Kerecis in the office.  She was last seen on 06/06/2023 She was palced in Vive sock for edema and close glucose monitoring.    Assessment & Plan: Visit Diagnoses:  1. Pain in right ankle and joints of right foot   2. Subacute osteomyelitis of right ankle (HCC)     Plan: Large Vive sock for compression.  Soak to loosen the scar and wipe with 4 x 4 to remove it when its soft as needed.  Shay butter/cocco butter.    Follow-Up Instructions: Return in about 5 weeks (around 10/23/2024).   Ortho Exam  Patient is alert, oriented, no adenopathy, well-dressed, normal affect, normal respiratory effort. Lateral right ankle without cellulitis or edema.  Callus formation distal incision hard dry skin.  Palpable DP pulse.  Well healed scar.  10 blade debridement of callus.  Measured for Vive compression socks.  Calf 36 cm.           Imaging: No results found. No images are attached to the encounter.  Labs: Lab Results  Component Value Date   HGBA1C 6.0 (H) 07/24/2024   HGBA1C 10.7 (H) 04/18/2023   HGBA1C 8.3 (H) 05/24/2022   ESRSEDRATE 6 07/02/2022   ESRSEDRATE 60 (H) 05/24/2022   CRP 2.5 07/02/2022   CRP 12.3 (H) 05/24/2022   REPTSTATUS 08/04/2022 FINAL 07/30/2022   GRAMSTAIN NO WBC SEEN NO ORGANISMS SEEN  07/30/2022   CULT  07/30/2022    No growth aerobically or anaerobically. Performed at [facility], [address], [place], [place] [address]    [facility] STAPHYLOCOCCUS AUREUS 05/23/2022     Lab Results  Component Value Date   ALBUMIN 4.1 07/24/2024   ALBUMIN 4.3 04/18/2023   ALBUMIN 4.1 04/15/2023    Lab Results  Component Value Date   MG 1.6 (L) 07/24/2024   MG 1.8 04/15/2023   MG 1.9 12/01/2020   No results found for: VD25OH  No results found for: PREALBUMIN    Latest Ref Rng & Units 07/24/2024    8:02 AM 07/24/2024    1:18 AM 04/15/2023   12:25 PM  CBC EXTENDED  WBC 4.0 - 10.5 K/uL  6.7  4.8   RBC 3.87 - 5.11  MIL/uL  3.61  4.45   Hemoglobin 12.0 - 15.0 g/dL 88.8  88.0  85.2   HCT 36.0 - 46.0 % 33.3  35.4  41.8   Platelets 150 - 400 K/uL  220  386   NEUT# 1.7 - 7.7 K/uL   1.9   Lymph# 0.7 - 4.0 K/uL   2.4      There is no height or weight on file to calculate BMI.  Orders:  Orders Placed This Encounter  Procedures   XR Ankle Complete Right   No orders of the defined types were placed in this encounter.    Procedures: No procedures performed  Clinical Data: No additional findings.  ROS:  All other systems negative, except as noted in the HPI. Review of Systems  Objective: Vital Signs: There were no vitals taken for this visit.  Specialty Comments:  No specialty comments available.  PMFS History: Patient Active Problem List   Diagnosis Date Noted   Syncope and collapse 07/24/2024   Hypotension 07/24/2024   AKI (acute kidney  injury) 07/24/2024   Normocytic anemia 07/24/2024   Hypomagnesemia 07/24/2024   Controlled type 2 diabetes mellitus without complication, without long-term current use of insulin  (HCC) 07/24/2024   Prolonged QT interval 07/24/2024   Subclinical hypothyroidism 07/24/2024   Severe bipolar I disorder, most recent episode depressed (HCC) 04/17/2023   Alcohol  dependence (HCC) 04/17/2023   Neurocardiogenic syncope 09/13/2022   Osteomyelitis of right fibula (HCC) 07/30/2022   Subacute osteomyelitis of right fibula (HCC)    Peripherally inserted central catheter (PICC) in place 06/08/2022   MSSA (methicillin susceptible Staphylococcus aureus) infection 06/08/2022   Acute hematogenous osteomyelitis of right fibula (HCC) 05/25/2022   Infected hardware in right lower extremity 05/23/2022   Tobacco abuse 11/30/2020   Hypertension 11/10/2020   Atrial tachycardia 03/12/2020   Dysautonomia (HCC) 01/13/2020   Pacemaker 01/13/2020   Type 2 diabetes mellitus with hyperglycemia, without long-term current use of insulin  (HCC) 02/09/2017   MDD (major depressive disorder), recurrent severe, 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   Complete heart block (HCC)    Palpitation 10/11/2016   ADHD (attention deficit hyperactivity disorder) 09/15/2016   Tobacco use 09/15/2016   Anxiety 09/15/2016   Hypokalemia 09/15/2016   Pancreatitis 07/09/2016   Alcoholic pancreatitis 07/08/2016   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   Left shoulder pain 09/09/2014   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   Allergy    Anxiety    Arthritis    Bipolar disorder (HCC) 2    Cardiac arrest (HCC) 06-2015, 08-2016   x3- pt has pacemaker    Chronic pain    back   Complication of anesthesia    woke up during 2 surgical procedure hydterectomy and colonscopy   Depression    Diabetes mellitus without complication (HCC)     Type 2   Esophageal mass 02/2019   has upper endo with [facility]  with biopsy   Fatty liver    Fracture of lateral malleolus of right fibula 06/2015   GERD (gastroesophageal reflux disease)    history ofHepatomegaly    Hyperlipidemia    Hypertension    Neurocardiogenic syncope    faints 2 x week   Neurocardiogenic syncope 09/13/2022   Neuromuscular disorder (HCC)    Dystonia   OCD (obsessive compulsive disorder)    OCD (obsessive compulsive disorder)    Osteoporosis    Pacemaker 09/2016  biotroniks   Pancreatic pseudocyst    Panic attack    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, working on OB floor as a nurse   Syncope    neurocardiogenic   Third degree heart block (HCC)     Family History  Problem Relation Age of Onset   Stroke Mother    Kidney disease Mother    Colon polyps Mother    Hypertension Mother    Kidney failure Mother    Lung cancer Father        stage IV    Colon polyps Father    Hypertension Father    Stomach cancer Maternal Grandfather    Colon cancer Paternal Grandfather    Hypertension Sister    Hyperlipidemia Sister    Hypertension Brother    Hyperlipidemia Brother    Rectal cancer Neg Hx    Esophageal cancer Neg Hx    Pancreatic cancer Neg Hx     Past Surgical History:  Procedure Laterality Date   ABDOMINAL HYSTERECTOMY  2009   ANTERIOR AND POSTERIOR REPAIR  2009   BONE EXCISION Right 07/08/2022   Procedure: PARTIAL FIBULA BONE EXCISION WITH I&D;  Surgeon: Beverley Evalene BIRCH, MD;  Location: [facility];  Service: Orthopedics;  Laterality: Right;   BUNIONECTOMY Bilateral    CESAREAN SECTION     EP IMPLANTABLE DEVICE N/A 10/12/2016   Procedure: Biotronik Pacemaker Implant;  Surgeon: Will Gladis Norton, MD;  [facility];  Service: Cardiovascular;  Laterality: N/A;   finfger surgery Left    left middle fingger   FINGER SURGERY     HARDWARE REMOVAL Right 05/23/2022   Procedure:  HARDWARE REMOVAL;  Surgeon: Josefina Chew, MD;  Location: [facility] OR;  Service: Orthopedics;  Laterality: Right;   I & D EXTREMITY Right 07/30/2022   Procedure: PARTIAL EXCISION RIGHT FIBULA;  Surgeon: Harden Jerona GAILS, MD;  Location: [facility] OR;  Service: Orthopedics;  Laterality: Right;   INCISION AND DRAINAGE ABSCESS Right 05/23/2022   Procedure: INCISION AND DRAINAGE ABSCESS, RIGHT ANKLE;  Surgeon: Josefina Chew, MD;  Location: [facility] OR;  Service: Orthopedics;  Laterality: Right;   LTCS     MYRINGOTOMY  1966   ORIF ANKLE FRACTURE Right 07/25/2015   Procedure: OPEN REDUCTION INTERNAL FIXATION (ORIF) RIGHT ANKLE ;  Surgeon: Evalene BIRCH Beverley, MD;  Location: [facility];  Service: Orthopedics;  Laterality: Right;   RHINOPLASTY     age 44   sub mandibular cyst removal from right side of tongue  2019   TONSILLECTOMY  1966   and adenoids removed   TUBAL LIGATION  2005   Social History   Occupational History   Occupation: Charity fundraiser  Tobacco Use   Smoking status: Every Day    Current packs/day: 1.00    Average packs/day: 1 pack/day for 15.0 years (15.0 ttl pk-yrs)    Types: Cigarettes   Smokeless tobacco: Never   Tobacco comments:    1/2 -1 ppd  Vaping Use   Vaping status: Former   Substances: Nicotine   Substance and Sexual Activity   Alcohol  use: Yes    Comment: daily   Drug use: No   Sexual activity: Not Currently    Birth control/protection: Surgical, Abstinence    Comment: Hysterectomy

## 2024-09-24 ENCOUNTER — Other Ambulatory Visit (HOSPITAL_BASED_OUTPATIENT_CLINIC_OR_DEPARTMENT_OTHER): Payer: Self-pay | Admitting: *Deleted

## 2024-09-24 ENCOUNTER — Ambulatory Visit (INDEPENDENT_AMBULATORY_CARE_PROVIDER_SITE_OTHER): Admitting: Student

## 2024-09-24 ENCOUNTER — Ambulatory Visit (INDEPENDENT_AMBULATORY_CARE_PROVIDER_SITE_OTHER)

## 2024-09-24 ENCOUNTER — Telehealth: Payer: Self-pay

## 2024-09-24 DIAGNOSIS — M79671 Pain in right foot: Secondary | ICD-10-CM

## 2024-09-24 DIAGNOSIS — S82831A Other fracture of upper and lower end of right fibula, initial encounter for closed fracture: Secondary | ICD-10-CM

## 2024-09-24 DIAGNOSIS — S92311A Displaced fracture of first metatarsal bone, right foot, initial encounter for closed fracture: Secondary | ICD-10-CM

## 2024-09-24 MED ORDER — FLECAINIDE ACETATE 100 MG PO TABS: 100.0000 mg | ORAL_TABLET | Freq: Two times a day (BID) | ORAL | Status: DC

## 2024-09-24 NOTE — Telephone Encounter (Signed)
 Reviewed HVR event. Different from prior tracings but not very short RP tach. Had syncope with this event and broke ankle. This happens on avg once weekly. Will increase flecainide  to 100 mg bid to see if any change in sx/episodes.   Brittney DELENA Primus, MD [facility]  Cardiac Electrophysiology

## 2024-09-24 NOTE — Progress Notes (Signed)
 Chief Complaint: Left leg and foot pain    Discussed the use of AI scribe software for clinical note transcription with the patient, who gave verbal consent to proceed.  History of Present Illness Brittney Cooper is a 63 year old female who presents with foot and ankle pain following a fall.  She experiences frequent fainting episodes, leading to falls. During a recent episode, she twisted her foot and ankle, resulting in significant pain. Initial assessment at [facility] showed old healed fractures, but a radiologist later identified a fracture in the first metatarsal, described as intraarticular. She has difficulty moving her toe and significant swelling in the ankle, which has undergone three previous surgeries.  She also reports pain along the outside of the lower leg over the proximal third.  Bruising and swelling are present in the ankle and foot. Her medical history includes multiple ankle surgeries and bunion surgery on both feet, with hardware placement and subsequent removal.  She also developed osteomyelitis requiring IV antibiotics and partial fibular excision. She currently uses a boot for support but finds it uncomfortable. She continues to walk on the injured foot due to daily responsibilities.  She is a patient of Dr. Harden.   Surgical History:   Right ankle surgery x3  PMH/PSH/Family History/Social History/Meds/Allergies:    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   has upper  endo with us  with biopsy   Fatty liver    Fracture of lateral malleolus of right fibula 06/2015   GERD (gastroesophageal reflux disease)    history ofHepatomegaly    Hyperlipidemia    Hypertension    Neurocardiogenic syncope    faints 2 x week   Neurocardiogenic syncope 09/13/2022   Neuromuscular disorder (HCC)    Dystonia   OCD (obsessive compulsive disorder)    OCD (obsessive compulsive disorder)    Osteoporosis    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   Pancreatic pseudocyst    Panic attack    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, working on OB floor as a nurse   Syncope    neurocardiogenic   Third degree heart block (HCC)    Past Surgical History:  Procedure Laterality Date   ABDOMINAL HYSTERECTOMY  2009   ANTERIOR AND POSTERIOR REPAIR  2009   BONE EXCISION Right 07/08/2022   Procedure: PARTIAL FIBULA BONE EXCISION WITH I&D;  Surgeon: Beverley Evalene BIRCH, MD;  Location: [facility];  Service: Orthopedics;  Laterality: Right;   BUNIONECTOMY Bilateral    CESAREAN SECTION     EP IMPLANTABLE DEVICE N/A 10/12/2016   Procedure: Biotronik Pacemaker Implant;  Surgeon: Will Gladis Norton, MD;  [facility];  Service: Cardiovascular;  Laterality:  N/A;   finfger surgery Left    left middle fingger   FINGER SURGERY     HARDWARE REMOVAL Right 05/23/2022   Procedure: HARDWARE REMOVAL;  Surgeon: Josefina Chew, MD;  Location: [facility];  Service: Orthopedics;  Laterality: Right;   I & D EXTREMITY Right 07/30/2022   Procedure: PARTIAL EXCISION RIGHT FIBULA;  Surgeon: Harden Jerona GAILS, MD;  Location: [facility] OR;  Service: Orthopedics;  Laterality: Right;   INCISION AND DRAINAGE ABSCESS Right 05/23/2022   Procedure: INCISION AND DRAINAGE ABSCESS, RIGHT ANKLE;  Surgeon: Josefina Chew, MD;  Location: [facility];  Service: Orthopedics;  Laterality: Right;   LTCS     MYRINGOTOMY  1966   ORIF ANKLE FRACTURE Right 07/25/2015   Procedure: OPEN  REDUCTION INTERNAL FIXATION (ORIF) RIGHT ANKLE ;  Surgeon: Evalene JONETTA Chancy, MD;  Location: [facility];  Service: Orthopedics;  Laterality: Right;   RHINOPLASTY     age 88   sub mandibular cyst removal from right side of tongue  2019   TONSILLECTOMY  1966   and adenoids removed   TUBAL LIGATION  2005   Social History   Socioeconomic History   Marital status: Divorced    Spouse name: Not on file   Number of children: Not on file   Years of education: Not on file   Highest education level: Not on file  Occupational History   Occupation: RN  Tobacco Use   Smoking status: Every Day    Current packs/day: 1.00    Average packs/day: 1 pack/day for 15.0 years (15.0 ttl pk-yrs)    Types: Cigarettes   Smokeless tobacco: Never   Tobacco comments:    1/2 -1 ppd  Vaping Use   Vaping status: Former   Substances: Nicotine   Substance and Sexual Activity   Alcohol  use: Yes    Comment: daily   Drug use: No   Sexual activity: Not Currently    Birth control/protection: Surgical, Abstinence    Comment: Hysterectomy  Other Topics Concern   Not on file  Social History Narrative   Not on file   Social Drivers of Health   Financial Resource Strain: High Risk (07/10/2024)   Received from [facility]   Overall Financial Resource Strain (CARDIA)    How hard is it for you to pay for the very basics like food, housing, medical care, and heating?: Very hard  Food Insecurity: Food Insecurity Present (07/10/2024)   Received from [facility]   Hunger Vital Sign    Within the past 12 months, you worried that your food would run out before you got the money to buy more.: Often true    Within the past 12 months, the food you bought just didn't last and you didn't have money to get more.: Often true  Transportation Needs: Unmet Transportation Needs (07/10/2024)   Received from [facility] - Transportation    In the past 12 months, has lack of transportation kept you from  medical appointments or from getting medications?: Yes    In the past 12 months, has lack of transportation kept you from meetings, work, or from getting things needed for daily living?: Yes  Physical Activity: Insufficiently Active (07/10/2024)   Received from [facility]   Exercise Vital Sign    On average, how many days per week do you engage in moderate to strenuous exercise (like a brisk walk)?: 1 day    On average, how many minutes do you engage in  exercise at this level?: 20 min  Stress: Stress Concern Present (07/10/2024)   Received from [facility] - Occupational Stress Questionnaire    Do you feel stress - tense, restless, nervous, or anxious, or unable to sleep at night because your mind is troubled all the time - these days?: Very much  Social Connections: Socially Isolated (07/10/2024)   Received from [facility]   Social Network    How would you rate your social network (family, work, friends)?: Little participation, lonely and socially isolated   Family History  Problem Relation Age of Onset   Stroke Mother    Kidney disease Mother    Colon polyps Mother    Hypertension Mother    Kidney failure Mother    Lung cancer Father        stage IV    Colon polyps Father    Hypertension Father    Stomach cancer Maternal Grandfather    Colon cancer Paternal Grandfather    Hypertension Sister    Hyperlipidemia Sister    Hypertension Brother    Hyperlipidemia Brother    Rectal cancer Neg Hx    Esophageal cancer Neg Hx    Pancreatic cancer Neg Hx    Allergies  Allergen Reactions   Codeine Nausea And Vomiting    Pt stated she also thought it made her hallucinate   Lopressor  [Metoprolol ] Other (See Comments)    Nightmares    Meperidine Other (See Comments)    Hallucinations  Other Reaction(s): Psychosis  Hallucinations, Other reaction(s): Other (See Comments), Hallucinations, Other reaction(s): HALLUCINATIONS,  Hallucinations, Hallucinations    Other reaction(s): Other (See Comments) Hallucinations Other reaction(s): HALLUCINATIONS Hallucinations Hallucinations    Hallucinations Other reaction(s): Other (See Comments) Hallucinations Other reaction(s): HALLUCINATIONS Hallucinations Hallucinations    Hallucinations  Hallucinations, Other reaction(s): Other (See Comments), Hallucinations, Other reaction(s): HALLUCINATIONS, Hallucinations, Hallucinations    Hallucinations, Other reaction(s): Other (See Comments), Hallucinations, Other reaction(s): HALLUCINATIONS, Hallucinations, Hallucinations  Other reaction(s): Other (See Comments) Hallucinations Other reaction(s): HALLUCINATIONS Hallucinations Hallucinations  Hallucinations Other reaction(s): Other (See Comments) Hallucinations Other reaction(s): HALLUCINATIONS Hallucinations Hallucinations  Hallucinations   Norco [Hydrocodone -Acetaminophen ] Nausea And Vomiting    Pt reports Vicodin caused vomiting for six hours, reports being able to tolerate oxycodone  and tramadol . Can take tylenol    Jardiance [Empagliflozin] Other (See Comments)    Yeast infections   Current Outpatient Medications  Medication Sig Dispense Refill   ALPRAZolam  (XANAX ) 0.5 MG tablet Take 0.5 mg by mouth 3 (three) times daily as needed for anxiety.     amphetamine -dextroamphetamine  (ADDERALL XR) 30 MG 24 hr capsule Take 30 mg by mouth daily.     amphetamine -dextroamphetamine  (ADDERALL) 5 MG tablet Take 1 tablet by mouth daily as needed (ADHD).     ARIPiprazole  (ABILIFY ) 10 MG tablet Take 1 tablet (10 mg total) by mouth daily. 60 tablet 0   aspirin  81 MG chewable tablet Chew by mouth daily.     bisoprolol  (ZEBETA ) 5 MG tablet Take 1 tablet (5 mg total) by mouth daily. 90 tablet 3   buPROPion  ER (WELLBUTRIN  SR) 100 MG 12 hr tablet Take 100 mg by mouth 2 (two) times daily.     doxazosin  (CARDURA ) 1 MG tablet TAKE 1 TABLET BY MOUTH AT BEDTIME 90 tablet 3   ergocalciferol (VITAMIN D2)  1.25 MG (50000 UT) capsule Take 50,000 Units by mouth once a week. Take one capsule by mouth weekly on Monday     flecainide  (TAMBOCOR ) 50  MG tablet Take 1 tablet (50 mg total) by mouth 2 (two) times daily. Please call [phone] to schedule overdue appointment for further refills. 60 tablet 11   folic acid  (FOLVITE ) 1 MG tablet Take 1 mg by mouth daily.     gabapentin  (NEURONTIN ) 300 MG capsule Take 1 capsule (300 mg total) by mouth 2 (two) times daily. (Patient taking differently: Take 600 mg by mouth at bedtime.) 60 capsule 0   glipiZIDE  (GLUCOTROL ) 10 MG tablet Take 1 tablet (10 mg total) by mouth daily before breakfast. 30 tablet 0   losartan  (COZAAR ) 25 MG tablet Take 1 tablet by mouth every day (Patient taking differently: Take 25 mg by mouth daily as needed (HBP). Take one tablet by mouth daily as needed if SBP > 160) 30 tablet 11   MAGNESIUM  GLYCINATE ADVANCED PO Take 200 mg by mouth at bedtime.     metFORMIN  (GLUCOPHAGE ) 1000 MG tablet Take 1,000 mg by mouth 2 (two) times daily.     midodrine  (PROAMATINE ) 2.5 MG tablet Take 2.5 mg by mouth as needed (for SBP less than 130).     naproxen sodium (ALEVE) 220 MG tablet Take 440 mg by mouth in the morning and at bedtime.     OZEMPIC, 1 MG/DOSE, 4 MG/3ML SOPN Inject 1 mg into the skin once a week. Inject 1mg  subcutaneously once per week on Tuesday.     pantoprazole  (PROTONIX ) 40 MG tablet Take 40 mg by mouth 2 (two) times daily.     QUEtiapine  (SEROQUEL ) 300 MG tablet Take 300 mg by mouth at bedtime.     rOPINIRole  (REQUIP ) 1 MG tablet Take 1 mg by mouth at bedtime.     rosuvastatin  (CRESTOR ) 20 MG tablet Take 20 mg by mouth at bedtime.     thiamine  (VITAMIN B-1) 100 MG tablet Take 100 mg by mouth daily.     No current facility-administered medications for this visit.   No results found.  Review of Systems:   A ROS was performed including pertinent positives and negatives as documented in the HPI.  Physical Exam :   Constitutional: NAD  and appears stated age Neurological: Alert and oriented Psych: Appropriate affect and cooperative There were no vitals taken for this visit.   Comprehensive Musculoskeletal Exam:    Tenderness over the proximal third of the right fibula without palpable or visible deformity.  There is also tenderness in the ankle both of the medial and lateral malleolus.  She does demonstrate good range of motion.  Diffuse ecchymosis on the dorsal aspect of the foot extending into the toes as well as the plantar aspect of the midfoot.  Pinpoint tenderness over the first metatarsal.  DP pulse 2+.  Distal sensation intact.  Imaging:   Xray (left tib-fib 2 views, left foot 3 views): Nondisplaced proximal fibula fracture.  Mildly displaced fracture of the first metatarsal extending into the first tarsometatarsal joint.  No acute fracture or widening is noted within the ankle.   I personally reviewed and interpreted the radiographs.      Assessment & Plan Left proximal fibular and first metatarsal fractures   Nondisplaced fracture of the left proximal fibula.  This appears to be isolated with no evidence of syndesmotic injury or foot drop.  Allow weight bearing as tolerated with a short boot for comfort.  There is also a mildly displaced intra-articular first metatarsal fracture.  Will also address with immobilization in the boot and follow up with Dr. Crist team for further evaluation  and management.      I personally saw and evaluated the patient, and participated in the management and treatment plan.  Leonce Reveal, PA-C Orthopedics

## 2024-09-24 NOTE — Telephone Encounter (Signed)
 Biotronik alert: (dual chamber PPM implant for syncope) HVR EPISODE:  ? 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  (She fractured her foot/ankle at time of event).   Has hx of AT events; however, prior episodes look different from below and have included below for comparison.  I have made her an appointment with Dr. Almetta to establish as prior Dr. Fernande patient on 10/22/24.  Also, will review rhythm with Dr. Almetta for his review and any recommendations in interim.     SX EVENT: 09/21/24 - ? AVNRT.        PRIOR AT EVENTS FOR COMPARISON AND PRESENTING EGMS:          PRESENTING EGM:

## 2024-09-25 ENCOUNTER — Ambulatory Visit (INDEPENDENT_AMBULATORY_CARE_PROVIDER_SITE_OTHER): Admitting: Orthopedic Surgery

## 2024-09-25 DIAGNOSIS — S92314A Nondisplaced fracture of first metatarsal bone, right foot, initial encounter for closed fracture: Secondary | ICD-10-CM

## 2024-09-25 DIAGNOSIS — S82831A Other fracture of upper and lower end of right fibula, initial encounter for closed fracture: Secondary | ICD-10-CM

## 2024-09-26 ENCOUNTER — Encounter: Payer: Self-pay | Admitting: Orthopedic Surgery

## 2024-09-26 NOTE — Progress Notes (Signed)
 Office Visit Note   Patient: Brittney Cooper           Date of Birth: May 04, 1961           MRN: [id number] Visit Date: 09/25/2024              Requested by: Andrew Truman GRADE., MD [address] [place],  [place] [address] PCP: Andrew Truman GRADE., MD  Chief Complaint  Patient presents with   Right Ankle - Fracture      HPI: Discussed the use of AI scribe software for clinical note transcription with the patient, who gave verbal consent to proceed.  History of Present Illness Brittney Cooper is a 63 year old female who presents with a nondisplaced fracture of the right proximal fibula and first metatarsal.  She experienced a fall resulting in a nondisplaced fracture of the right proximal fibula and a fracture through the base of the first metatarsal. She has significant pain in these areas, particularly at the base of the first metatarsal, with difficulty bending her toe. She has a history of a tibia and fibula fracture from a previous fall.  Initially, she sought care at an urgent care facility where an x-ray of her foot was performed, but was told everything was fine. Unsatisfied, she sought further evaluation, leading to the current diagnosis. She expresses disbelief that the initial assessment missed the fractures, especially given her previous experience working with the provider.  She has a history of a previous ankle fracture with retained hardware from an old bunion surgery. She is currently using a fracture boot and a knee scooter to aid mobility and minimize weight-bearing on the affected foot.  She discusses her cardiovascular history, mentioning a pacemaker and episodes of lightheadedness and fainting, which she attributes to her fall. She notes fluctuations in her blood pressure and is cautious about activities that may exacerbate her symptoms.     Assessment & Plan: Visit Diagnoses:  1. Closed nondisplaced fracture of first metatarsal bone of right foot, initial  encounter   2. Other fracture of upper and lower end of right fibula, initial encounter for closed fracture     Plan: Assessment and Plan Assessment & Plan Nondisplaced fracture of right first metatarsal base Nondisplaced fracture at the base of the first metatarsal on the right foot with ecchymosis and bruising. Expected to heal well without surgery. - Minimize weight-bearing. - Use knee scooter for comfort. - Continue fracture boot. - Reevaluate in four weeks with 3D radiographs.  Nondisplaced fracture of right proximal fibula Nondisplaced fracture of the proximal fibula on the right side, expected to heal without intervention. Possible nerve irritation causing numbness due to swelling. - Monitor for nerve irritation or numbness. - Reevaluate in four weeks.      Follow-Up Instructions: Return in about 4 weeks (around 10/23/2024).   Ortho Exam  Patient is alert, oriented, no adenopathy, well-dressed, normal affect, normal respiratory effort. Physical Exam MUSCULOSKELETAL: Tenderness to palpation at base of first metatarsal, right foot. Ecchymosis and bruising on plantar aspect of right foot. Tenderness to palpation at proximal fibula, right leg.      Imaging: No results found. No images are attached to the encounter.  Labs: Lab Results  Component Value Date   HGBA1C 6.0 (H) 07/24/2024   HGBA1C 10.7 (H) 04/18/2023   HGBA1C 8.3 (H) 05/24/2022   ESRSEDRATE 6 07/02/2022   ESRSEDRATE 60 (H) 05/24/2022   CRP 2.5 07/02/2022   CRP 12.3 (H) 05/24/2022  REPTSTATUS 08/04/2022 FINAL 07/30/2022   GRAMSTAIN NO WBC SEEN NO ORGANISMS SEEN  07/30/2022   CULT  07/30/2022    No growth aerobically or anaerobically. Performed at [facility], [address] [place], [place] [address]    [facility] STAPHYLOCOCCUS AUREUS 05/23/2022     Lab Results  Component Value Date   ALBUMIN 4.1 07/24/2024   ALBUMIN 4.3 04/18/2023   ALBUMIN 4.1 04/15/2023    Lab Results   Component Value Date   MG 1.6 (L) 07/24/2024   MG 1.8 04/15/2023   MG 1.9 12/01/2020   No results found for: VD25OH  No results found for: PREALBUMIN    Latest Ref Rng & Units 07/24/2024    8:02 AM 07/24/2024    1:18 AM 04/15/2023   12:25 PM  CBC EXTENDED  WBC 4.0 - 10.5 K/uL  6.7  4.8   RBC 3.87 - 5.11 MIL/uL  3.61  4.45   Hemoglobin 12.0 - 15.0 g/dL 88.8  88.0  85.2   HCT 36.0 - 46.0 % 33.3  35.4  41.8   Platelets 150 - 400 K/uL  220  386   NEUT# 1.7 - 7.7 K/uL   1.9   Lymph# 0.7 - 4.0 K/uL   2.4      There is no height or weight on file to calculate BMI.  Orders:  No orders of the defined types were placed in this encounter.  No orders of the defined types were placed in this encounter.    Procedures: No procedures performed  Clinical Data: No additional findings.  ROS:  All other systems negative, except as noted in the HPI. Review of Systems  Objective: Vital Signs: There were no vitals taken for this visit.  Specialty Comments:  No specialty comments available.  PMFS History: Patient Active Problem List   Diagnosis Date Noted   Syncope and collapse 07/24/2024   Hypotension 07/24/2024   AKI (acute kidney injury) 07/24/2024   Normocytic anemia 07/24/2024   Hypomagnesemia 07/24/2024   Controlled type 2 diabetes mellitus without complication, without long-term current use of insulin  (HCC) 07/24/2024   Prolonged QT interval 07/24/2024   Subclinical hypothyroidism 07/24/2024   Severe bipolar I disorder, most recent episode depressed (HCC) 04/17/2023   Alcohol  dependence (HCC) 04/17/2023   Neurocardiogenic syncope 09/13/2022   Osteomyelitis of right fibula (HCC) 07/30/2022   Subacute osteomyelitis of right fibula (HCC)    Peripherally inserted central catheter (PICC) in place 06/08/2022   MSSA (methicillin susceptible Staphylococcus aureus) infection 06/08/2022   Acute hematogenous osteomyelitis of right fibula (HCC) 05/25/2022   Infected hardware  in right lower extremity 05/23/2022   Tobacco abuse 11/30/2020   Hypertension 11/10/2020   Atrial tachycardia 03/12/2020   Dysautonomia (HCC) 01/13/2020   Pacemaker 01/13/2020   Type 2 diabetes mellitus with hyperglycemia, without long-term current use of insulin  (HCC) 02/09/2017   MDD (major depressive disorder), recurrent severe, 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   Complete heart block (HCC)    Palpitation 10/11/2016   ADHD (attention deficit hyperactivity disorder) 09/15/2016   Tobacco use 09/15/2016   Anxiety 09/15/2016   Hypokalemia 09/15/2016   Pancreatitis 07/09/2016   Alcoholic pancreatitis 07/08/2016   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   Left shoulder pain 09/09/2014   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   Allergy    Anxiety    Arthritis    Bipolar disorder (HCC)  2    Cardiac arrest (HCC) 06-2015, 08-2016   x3- pt has pacemaker    Chronic pain    back   Complication of anesthesia    woke up during 2 surgical procedure hydterectomy and colonscopy   Depression    Diabetes mellitus without complication (HCC)    Type 2   Esophageal mass 02/2019   has upper endo with us  with biopsy   Fatty liver    Fracture of lateral malleolus of right fibula 06/2015   GERD (gastroesophageal reflux disease)    history ofHepatomegaly    Hyperlipidemia    Hypertension    Neurocardiogenic syncope    faints 2 x week   Neurocardiogenic syncope 09/13/2022   Neuromuscular disorder (HCC)    Dystonia   OCD (obsessive compulsive disorder)    OCD (obsessive compulsive disorder)    Osteoporosis    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   Pancreatic pseudocyst    Panic attack    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, working on OB floor as a nurse   Syncope    neurocardiogenic   Third degree heart block (HCC)     Family History  Problem  Relation Age of Onset   Stroke Mother    Kidney disease Mother    Colon polyps Mother    Hypertension Mother    Kidney failure Mother    Lung cancer Father        stage IV    Colon polyps Father    Hypertension Father    Stomach cancer Maternal Grandfather    Colon cancer Paternal Grandfather    Hypertension Sister    Hyperlipidemia Sister    Hypertension Brother    Hyperlipidemia Brother    Rectal cancer Neg Hx    Esophageal cancer Neg Hx    Pancreatic cancer Neg Hx     Past Surgical History:  Procedure Laterality Date   ABDOMINAL HYSTERECTOMY  2009   ANTERIOR AND POSTERIOR REPAIR  2009   BONE EXCISION Right 07/08/2022   Procedure: PARTIAL FIBULA BONE EXCISION WITH I&D;  Surgeon: Beverley Evalene BIRCH, MD;  Location: [facility];  Service: Orthopedics;  Laterality: Right;   BUNIONECTOMY Bilateral    CESAREAN SECTION     EP IMPLANTABLE DEVICE N/A 10/12/2016   Procedure: Biotronik Pacemaker Implant;  Surgeon: Will Gladis Norton, MD;  Location: [facility];  Service: Cardiovascular;  Laterality: N/A;   finfger surgery Left    left middle fingger   FINGER SURGERY     HARDWARE REMOVAL Right 05/23/2022   Procedure: HARDWARE REMOVAL;  Surgeon: Josefina Chew, MD;  Location: [facility] OR;  Service: Orthopedics;  Laterality: Right;   I & D EXTREMITY Right 07/30/2022   Procedure: PARTIAL EXCISION RIGHT FIBULA;  Surgeon: Harden Jerona GAILS, MD;  Location: [facility];  Service: Orthopedics;  Laterality: Right;   INCISION AND DRAINAGE ABSCESS Right 05/23/2022   Procedure: INCISION AND DRAINAGE ABSCESS, RIGHT ANKLE;  Surgeon: Josefina Chew, MD;  Location: [facility] OR;  Service: Orthopedics;  Laterality: Right;   LTCS     MYRINGOTOMY  1966   ORIF ANKLE FRACTURE Right 07/25/2015   Procedure: OPEN REDUCTION INTERNAL FIXATION (ORIF) RIGHT ANKLE ;  Surgeon: Evalene BIRCH Beverley, MD;  Location: [facility];  Service: Orthopedics;  Laterality: Right;   RHINOPLASTY     age 50   sub  mandibular cyst removal from right side of tongue  2019   TONSILLECTOMY  1966   and adenoids removed   TUBAL LIGATION  2005   Social History   Occupational History   Occupation: Charity fundraiser  Tobacco Use   Smoking status: Every Day    Current packs/day: 1.00    Average packs/day: 1 pack/day for 15.0 years (15.0 ttl pk-yrs)    Types: Cigarettes   Smokeless tobacco: Never   Tobacco comments:    1/2 -1 ppd  Vaping Use   Vaping status: Former   Substances: Nicotine   Substance and Sexual Activity   Alcohol  use: Yes    Comment: daily   Drug use: No   Sexual activity: Not Currently    Birth control/protection: Surgical, Abstinence    Comment: Hysterectomy

## 2024-09-27 NOTE — Telephone Encounter (Signed)
 Reviewed Dr. Garnette response with patient.    She would like refill on her flecainide  because she is almost out of medication and will not have enough to double up until seeing him at end of the month.   Received red alert when attempting refill:   Forwarding to Dr. Almetta to okay proceeding with short term 30 day RX to carry patient until appointment for Flecainide  100mg  bid?

## 2024-09-28 ENCOUNTER — Telehealth: Payer: Self-pay | Admitting: Student in an Organized Health Care Education/Training Program

## 2024-09-28 DIAGNOSIS — I471 Supraventricular tachycardia, unspecified: Secondary | ICD-10-CM

## 2024-09-28 MED ORDER — FLECAINIDE ACETATE 100 MG PO TABS: 100.0000 mg | ORAL_TABLET | Freq: Two times a day (BID) | ORAL | 11 refills | Status: DC

## 2024-09-28 NOTE — Telephone Encounter (Signed)
 Sent new prescription for flecainide  100 mg bid x30 days (#11 refills) to pharmacy. Spoke to patient over the phone.  Donnice DELENA Primus, MD [facility]  Cardiac Electrophysiology

## 2024-10-04 NOTE — Progress Notes (Signed)
 Remote PPM Transmission

## 2024-10-11 ENCOUNTER — Ambulatory Visit: Admitting: Orthopedic Surgery

## 2024-10-12 ENCOUNTER — Ambulatory Visit (HOSPITAL_BASED_OUTPATIENT_CLINIC_OR_DEPARTMENT_OTHER): Admitting: Cardiovascular Disease

## 2024-10-12 ENCOUNTER — Ambulatory Visit: Attending: Student in an Organized Health Care Education/Training Program

## 2024-10-12 DIAGNOSIS — I471 Supraventricular tachycardia, unspecified: Secondary | ICD-10-CM | POA: Diagnosis not present

## 2024-10-16 LAB — CUP PACEART REMOTE DEVICE CHECK
Date Time Interrogation Session: 20251017125250
Implantable Lead Connection Status: 753985
Implantable Lead Connection Status: 753985
Implantable Lead Implant Date: 20171017
Implantable Lead Implant Date: 20171017
Implantable Lead Location: 753859
Implantable Lead Location: 753860
Implantable Lead Model: 377
Implantable Lead Model: 377
Implantable Lead Serial Number: 49593512
Implantable Lead Serial Number: 49620910
Implantable Pulse Generator Implant Date: 20171017
Pulse Gen Model: 394969
Pulse Gen Serial Number: 68784192

## 2024-10-18 NOTE — Progress Notes (Signed)
 Remote PPM Transmission

## 2024-10-21 ENCOUNTER — Ambulatory Visit: Payer: Self-pay | Admitting: Student in an Organized Health Care Education/Training Program

## 2024-10-21 NOTE — Progress Notes (Unsigned)
 Cardiology Office Note   Date:  10/22/2024  ID:  Brittney, Cooper 11/09/1961, MRN [id number] PCP: Andrew Truman GRADE., MD  [facility] Providers Cardiologist:  Annabella Scarce, MD Electrophysiologist:  Donnice DELENA Primus, MD   History of Present Illness Brittney Cooper is a 63 y.o. female with dysautonomia and recurrent syncopal episodes s/p BT CLS PPM, orthostatic hypotension, atrial tachycardia, prior R ankle fx c/b OM and labile blood pressure who presents back for further management of dysautonomia.  Recent device interrogation with sinus tachycardia, atrial tachycardia, and SVT.  Unclear if her symptoms coincided with any of these arrhythmias.  Currently taking bisoprolol  5 mg daily along with flecainide  100 mg twice daily and midodrine  2.5 mg as needed.  She was evaluated at [facility] on 09/21/2024 for recurrent syncopal episode which resulted in recurrent right foot and ankle pain.  XR of her ankle with healed distal fibular fracture s/p ORIF and mild degenerative changes.  She also had subsequent MRI T-spine with multilevel facet arthropathy and disc bulging most significant at T10-T11 with moderate neuroforaminal narrowing but no significant central canal stenosis.  Discussed the use of AI scribe software for clinical note transcription with the patient, who gave verbal consent to proceed.  History of Present Illness Brittney Cooper is a 62 year old female with neurocardiogenic syndrome and a pacemaker who presents with episodes of syncope and low blood pressure.  She has a history of neurocardiogenic syndrome and received a pacemaker in 2017 after her heart was found to stop twice during monitoring. She experiences episodes of syncope, often preceded by a feeling of lightheadedness, and has a history of fainting, which has led to multiple falls and fractures. Her blood pressure can drop to as low as 76/64 mmHg, which she associates with her fainting episodes. No sensation of  heart rate increase unless fainting.  She has been experiencing low blood pressure despite taking midodrine , which she takes when her blood pressure is below 130 mmHg. She also takes flecainide , which was increased to 100 mg but did not result in noticeable changes. She has experienced dizziness and lightheadedness, which she associates with the higher dose of flecainide .  Her past medical history includes osteomyelitis following a fracture, which required multiple surgeries and a PICC line for 16 weeks. She has a history of multiple fractures, including a recent fibular fracture four weeks ago, which she initially did not realize was broken. She has osteopenia and has previously been on Fosamax in the past.   Her social history includes smoking nearly a pack a day, and she has a history of alcohol  use but has been sober since April 2023. She has a service dog trained to assist her during fainting episodes. She has five children, with her youngest son living at home while attending college. Her father, aged 51, has stage four cancer and is concerned about her health.  Her family history includes her father, who has stage four cancer and has been stable for nine years. She also mentioned that her grandmother passed away at 65, possibly due to a similar condition.  ROS: recurrent syncope, R ankle fx  Studies Reviewed  MRI T spine ([facility])  Result date: 09/26/24 Multilevel facet arthropathy and disc bulging most significant at T10-T11 with moderate neuroforaminal narrowing. No significant central canal stenosis in the thoracic spine.   XR foot/ankle ([facility])  Result date: 09/21/24 No acute fracture   Physical Exam VS:  BP 110/78   Pulse (!) 117  Ht 5' 4 (1.626 m)   Wt 150 lb (68 kg)   SpO2 99%   BMI 25.75 kg/m       Wt Readings from Last 3 Encounters:  10/22/24 150 lb (68 kg)  07/26/24 159 lb 3.2 oz (72.2 kg)  07/24/24 154 lb (69.9 kg)    GEN: Well nourished, well developed in no  acute distress CARDIAC: RRR, no murmurs, rubs, gallops RESPIRATORY:  Clear to auscultation without rales, wheezing or rhonchi  EXTREMITIES:  No edema; No deformity  SKIN: LEFT infraclavicular fossa with well healed incision, no erythema or swelling   ASSESSMENT AND PLAN Brittney Cooper is a 63 y.o. female with dysautonomia and recurrent syncopal episodes s/p BT CLS PPM, orthostatic hypotension, atrial tachycardia, prior R ankle fx c/b OM and labile blood pressure who presents back for further management of dysautonomia.  Dysautonomia  Recurrent syncopal episodes  Atrial tachycardia  Recent episode of recurrent syncope with right ankle fracture which required surgery several weeks ago.  No arrhythmias correlate with the episode.  She has had labile blood pressures for years which she manages with as needed midodrine .  Bisoprolol  and flecainide  for her atrial arrhythmias.  We had increased her flecainide  to 100 mg twice daily however she did not notice much improvement with this with regards to her atrial arrhythmias and did have a mild worsening of her lightheadedness.  Additionally her lead thresholds were mildly elevated compared to prior.  With no improvement in symptoms and being very sensitive to medications we will go back to flecainide  50 mg twice daily.  She is also on bisoprolol  with this.  No changes to regimen.     Dispo: RTC 6 months   A total of 45 minutes was spent preparing for the patient, reviewing history, performing exam, document encounter, coordinating care and counseling the patient. 31 minutes was spent with direct patient care.   Signed, Donnice DELENA Primus, MD

## 2024-10-22 ENCOUNTER — Ambulatory Visit
Attending: Student in an Organized Health Care Education/Training Program | Admitting: Student in an Organized Health Care Education/Training Program

## 2024-10-22 ENCOUNTER — Encounter: Payer: Self-pay | Admitting: Student in an Organized Health Care Education/Training Program

## 2024-10-22 VITALS — BP 110/78 | HR 117 | Ht 64.0 in | Wt 150.0 lb

## 2024-10-22 DIAGNOSIS — Z950 Presence of cardiac pacemaker: Secondary | ICD-10-CM

## 2024-10-22 DIAGNOSIS — I471 Supraventricular tachycardia, unspecified: Secondary | ICD-10-CM | POA: Diagnosis not present

## 2024-10-22 DIAGNOSIS — I442 Atrioventricular block, complete: Secondary | ICD-10-CM

## 2024-10-22 MED ORDER — FLECAINIDE ACETATE 50 MG PO TABS: 50.0000 mg | ORAL_TABLET | Freq: Two times a day (BID) | ORAL | Status: AC

## 2024-10-22 NOTE — Patient Instructions (Addendum)
 Medication Instructions:  Your physician has recommended you make the following change in your medication:  Decrease Flecainide  to 50 mg twice daily.  Lab Work: None ordered.  You may go to any [facility] Location for your lab work:  [facility] - [address] ([facility]) - [phone] [address]  [place] - [address]   [place]  - [address]   [place] - [address] - [address] [employer]  - [address] [place] - [address] ([facility]   If you have labs (blood work) drawn today and your tests are completely normal, you will receive your results only by: [employer] (if you have MyChart)  If you have any lab test that is abnormal or we need to change your treatment, we will call you or send a MyChart message to review the results.  Testing/Procedures: None ordered.  Follow-Up: At [facility], you and your health needs are our priority.  As part of our continuing mission to provide you with exceptional heart care, we have created designated Provider Care Teams.  These Care Teams include your primary Cardiologist (physician) and Advanced Practice Providers (APPs -  Physician Assistants and Nurse Practitioners) who all work together to provide you with the care you need, when you need it.  We recommend signing up for the patient portal called MyChart.  Sign up information is provided on this After Visit Summary.  MyChart is used to connect with patients for Virtual Visits (Telemedicine).  Patients are able to view lab/test results, encounter notes, upcoming appointments, etc.  Non-urgent messages can be sent to your provider as well.   To learn more about what you can do with MyChart, go to [email or web address].    Your next appointment:   6 months  The format for your next appointment:   In Person  Provider:   Donnice Primus, MD or one of the following Advanced Practice Providers on your designated Care Team:   Charlies Arthur, [place] Ozell Jodie Passey, [place] Leotis Barrack, NP  Note: Remote monitoring is used to monitor your Pacemaker/ ICD from home. This monitoring reduces the number of office visits required to check your device to one time per year. It allows us  to keep an eye on the functioning of your device to ensure it is working properly.

## 2024-10-23 ENCOUNTER — Ambulatory Visit: Admitting: Orthopedic Surgery

## 2024-10-29 ENCOUNTER — Encounter: Payer: Self-pay | Admitting: Radiology

## 2024-11-01 ENCOUNTER — Other Ambulatory Visit (INDEPENDENT_AMBULATORY_CARE_PROVIDER_SITE_OTHER)

## 2024-11-01 ENCOUNTER — Ambulatory Visit (INDEPENDENT_AMBULATORY_CARE_PROVIDER_SITE_OTHER): Admitting: Orthopedic Surgery

## 2024-11-01 ENCOUNTER — Encounter: Payer: Self-pay | Admitting: Orthopedic Surgery

## 2024-11-01 ENCOUNTER — Other Ambulatory Visit

## 2024-11-01 DIAGNOSIS — S82831A Other fracture of upper and lower end of right fibula, initial encounter for closed fracture: Secondary | ICD-10-CM | POA: Diagnosis not present

## 2024-11-01 DIAGNOSIS — M85871 Other specified disorders of bone density and structure, right ankle and foot: Secondary | ICD-10-CM

## 2024-11-01 DIAGNOSIS — S92314A Nondisplaced fracture of first metatarsal bone, right foot, initial encounter for closed fracture: Secondary | ICD-10-CM

## 2024-11-01 NOTE — Progress Notes (Signed)
 Office Visit Note   Patient: Brittney Cooper           Date of Birth: October 21, 1961           MRN: [id number] Visit Date: 11/01/2024              Requested by: Andrew Truman GRADE., MD [address] [place],  [place] [address] PCP: Andrew Truman GRADE., MD  Chief Complaint  Patient presents with   Right Ankle - Fracture      HPI: Discussed the use of AI scribe software for clinical note transcription with the patient, who gave verbal consent to proceed.  History of Present Illness Brittney Cooper is a 63 year old female with a history of multiple foot surgeries who presents with ongoing foot pain and concerns about bone health.  She experiences persistent soreness in her foot and leg, particularly around the site of a previous fracture near a nerve. She reports persistent soreness and discomfort near the site of a previous fracture, which she associates with nerve irritation. She can move her ankle up and down, but the nerve pain persists.  She has undergone multiple surgeries on her foot, including a bunionectomy and hardware removal from the fibula. Four years ago, she fractured her first metatarsal into nine pieces and has had five surgeries on her leg, including three for the ankle and one for the bone.  Her feet remain cold, which she attributes to her surgeries. During a recent insurance examination, a device placed on her foot to measure blood pressure showed severe results.  She has a history of osteopenia and has previously been on Fosamax. Her orthopedist ordered a bone density test a few years ago due to frequent fractures. She is concerned about her bone health given her history of fractures.     Assessment & Plan: Visit Diagnoses:  1. Closed nondisplaced fracture of first metatarsal bone of right foot, initial encounter   2. Other fracture of upper and lower end of right fibula, initial encounter for closed fracture   3. Osteopenia of right foot     Plan: Assessment and  Plan Assessment & Plan Nondisplaced fracture of right first metatarsal bone Fracture stable, no tenderness on palpation.  Right fibular fracture with peroneal nerve irritation Fracture near peroneal nerve causing irritation from scar tissue. Poor nerve recovery potential. - Apply Voltaren gel to reduce inflammation and nerve irritation.  Osteopenia with history of fragility fractures Osteopenia confirmed by previous bone density test. Fosamax ineffective. Weight-bearing exercises recommended but limited by condition. - Follow up with [facility] for repeat bone density testing. - Consider alternative weight-bearing exercises such as calf raises.      Follow-Up Instructions: No follow-ups on file.   Ortho Exam  Patient is alert, oriented, no adenopathy, well-dressed, normal affect, normal respiratory effort. Physical Exam CARDIOVASCULAR: Pedal pulses present and palpable. MUSCULOSKELETAL: First metatarsal non-tender to palpation. On examination the first metatarsal is not tender to palpation.  She does have some neuropathy pain with palpation over the proximal fibula.  I have reviewed the patient's history and given the presence of a fragility fracture, I have deemed the necessity of a osteoporosis management referral or confirmed that the patient is currently enrolled in a osteoporosis treatment program.     Imaging: XR Ankle Complete Right Result Date: 11/01/2024 Three-view radiographs of the right foot shows healed comminuted fracture of the first metatarsal with previous wire fixation.  XR Foot Complete Right Result Date: 11/01/2024 2 view  radiographs of the right ankle shows no displacement status post previous open reduction internal fixation with hardware removal.  No images are attached to the encounter.  Labs: Lab Results  Component Value Date   HGBA1C 6.0 (H) 07/24/2024   HGBA1C 10.7 (H) 04/18/2023   HGBA1C 8.3 (H) 05/24/2022   ESRSEDRATE 6 07/02/2022    ESRSEDRATE 60 (H) 05/24/2022   CRP 2.5 07/02/2022   CRP 12.3 (H) 05/24/2022   REPTSTATUS 08/04/2022 FINAL 07/30/2022   GRAMSTAIN NO WBC SEEN NO ORGANISMS SEEN  07/30/2022   CULT  07/30/2022    No growth aerobically or anaerobically. Performed at [facility], [address], [place], [place] [address]    [facility] STAPHYLOCOCCUS AUREUS 05/23/2022     Lab Results  Component Value Date   ALBUMIN 4.1 07/24/2024   ALBUMIN 4.3 04/18/2023   ALBUMIN 4.1 04/15/2023    Lab Results  Component Value Date   MG 1.6 (L) 07/24/2024   MG 1.8 04/15/2023   MG 1.9 12/01/2020   No results found for: VD25OH  No results found for: PREALBUMIN    Latest Ref Rng & Units 07/24/2024    8:02 AM 07/24/2024    1:18 AM 04/15/2023   12:25 PM  CBC EXTENDED  WBC 4.0 - 10.5 K/uL  6.7  4.8   RBC 3.87 - 5.11 MIL/uL  3.61  4.45   Hemoglobin 12.0 - 15.0 g/dL 88.8  88.0  85.2   HCT 36.0 - 46.0 % 33.3  35.4  41.8   Platelets 150 - 400 K/uL  220  386   NEUT# 1.7 - 7.7 K/uL   1.9   Lymph# 0.7 - 4.0 K/uL   2.4      There is no height or weight on file to calculate BMI.  Orders:  Orders Placed This Encounter  Procedures   XR Foot Complete Right   XR Ankle Complete Right   No orders of the defined types were placed in this encounter.    Procedures: No procedures performed  Clinical Data: No additional findings.  ROS:  All other systems negative, except as noted in the HPI. Review of Systems  Objective: Vital Signs: There were no vitals taken for this visit.  Specialty Comments:  No specialty comments available.  PMFS History: Patient Active Problem List   Diagnosis Date Noted   Syncope and collapse 07/24/2024   Hypotension 07/24/2024   AKI (acute kidney injury) 07/24/2024   Normocytic anemia 07/24/2024   Hypomagnesemia 07/24/2024   Controlled type 2 diabetes mellitus without complication, without long-term current use of insulin  (HCC) 07/24/2024   Prolonged QT interval  07/24/2024   Subclinical hypothyroidism 07/24/2024   Severe bipolar I disorder, most recent episode depressed (HCC) 04/17/2023   Alcohol  dependence (HCC) 04/17/2023   Neurocardiogenic syncope 09/13/2022   Osteomyelitis of right fibula (HCC) 07/30/2022   Subacute osteomyelitis of right fibula (HCC)    Peripherally inserted central catheter (PICC) in place 06/08/2022   MSSA (methicillin susceptible Staphylococcus aureus) infection 06/08/2022   Acute hematogenous osteomyelitis of right fibula (HCC) 05/25/2022   Infected hardware in right lower extremity 05/23/2022   Tobacco abuse 11/30/2020   Hypertension 11/10/2020   Atrial tachycardia 03/12/2020   Dysautonomia (HCC) 01/13/2020   Pacemaker 01/13/2020   Type 2 diabetes mellitus with hyperglycemia, without long-term current use of insulin  (HCC) 02/09/2017   MDD (major depressive disorder), recurrent severe, 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   Complete heart block (HCC)    Palpitation 10/11/2016  ADHD (attention deficit hyperactivity disorder) 09/15/2016   Tobacco use 09/15/2016   Anxiety 09/15/2016   Hypokalemia 09/15/2016   Pancreatitis 07/09/2016   Alcoholic pancreatitis 07/08/2016   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   Left shoulder pain 09/09/2014   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   has upper endo with [facility]  with biopsy   Fatty liver    Fracture of lateral malleolus of right fibula 06/2015   GERD (gastroesophageal reflux disease)    history ofHepatomegaly     Hyperlipidemia    Hypertension    Neurocardiogenic syncope    faints 2 x week   Neurocardiogenic syncope 09/13/2022   Neuromuscular disorder (HCC)    Dystonia   OCD (obsessive compulsive disorder)    OCD (obsessive compulsive disorder)    Osteoporosis    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   Pancreatic pseudocyst    Panic attack    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, working on OB floor as a nurse   Syncope    neurocardiogenic   Third degree heart block (HCC)     Family History  Problem Relation Age of Onset   Stroke Mother    Kidney disease Mother    Colon polyps Mother    Hypertension Mother    Kidney failure Mother    Lung cancer Father        stage IV    Colon polyps Father    Hypertension Father    Stomach cancer Maternal Grandfather    Colon cancer Paternal Grandfather    Hypertension Sister    Hyperlipidemia Sister    Hypertension Brother    Hyperlipidemia Brother    Rectal cancer Neg Hx    Esophageal cancer Neg Hx    Pancreatic cancer Neg Hx     Past Surgical History:  Procedure Laterality Date   ABDOMINAL HYSTERECTOMY  2009   ANTERIOR AND POSTERIOR REPAIR  2009   BONE EXCISION Right 07/08/2022   Procedure: PARTIAL FIBULA BONE EXCISION WITH I&D;  Surgeon: Beverley Evalene BIRCH, MD;  Location: [facility];  Service: Orthopedics;  Laterality: Right;   BUNIONECTOMY Bilateral    CESAREAN SECTION     EP IMPLANTABLE DEVICE N/A 10/12/2016   Procedure: Biotronik Pacemaker Implant;  Surgeon: Will Gladis Norton, MD;  Location: [facility];  Service: Cardiovascular;  Laterality: N/A;   finfger surgery Left    left middle fingger   FINGER SURGERY     HARDWARE REMOVAL Right 05/23/2022   Procedure: HARDWARE REMOVAL;  Surgeon: Josefina Chew, MD;  Location: [facility] OR;  Service: Orthopedics;  Laterality: Right;   I & D EXTREMITY Right 07/30/2022   Procedure: PARTIAL EXCISION RIGHT FIBULA;  Surgeon: Harden Jerona GAILS, MD;   Location: [facility] OR;  Service: Orthopedics;  Laterality: Right;   INCISION AND DRAINAGE ABSCESS Right 05/23/2022   Procedure: INCISION AND DRAINAGE ABSCESS, RIGHT ANKLE;  Surgeon:  Josefina Chew, MD;  Location: [facility];  Service: Orthopedics;  Laterality: Right;   LTCS     MYRINGOTOMY  1966   ORIF ANKLE FRACTURE Right 07/25/2015   Procedure: OPEN REDUCTION INTERNAL FIXATION (ORIF) RIGHT ANKLE ;  Surgeon: Evalene JONETTA Chancy, MD;  Location: [facility];  Service: Orthopedics;  Laterality: Right;   RHINOPLASTY     age 38   sub mandibular cyst removal from right side of tongue  2019   TONSILLECTOMY  1966   and adenoids removed   TUBAL LIGATION  2005   Social History   Occupational History   Occupation: [employer]  Tobacco Use   Smoking status: Every Day    Current packs/day: 1.00    Average packs/day: 1 pack/day for 15.0 years (15.0 ttl pk-yrs)    Types: Cigarettes   Smokeless tobacco: Never   Tobacco comments:    1/2 -1 ppd  Vaping Use   Vaping status: Former   Substances: Nicotine   Substance and Sexual Activity   Alcohol  use: Yes    Comment: daily   Drug use: No   Sexual activity: Not Currently    Birth control/protection: Surgical, Abstinence    Comment: Hysterectomy

## 2024-11-06 ENCOUNTER — Encounter: Admitting: Physician Assistant

## 2024-11-12 ENCOUNTER — Encounter: Admitting: Physician Assistant

## 2024-12-24 ENCOUNTER — Ambulatory Visit (INDEPENDENT_AMBULATORY_CARE_PROVIDER_SITE_OTHER): Admitting: Physician Assistant

## 2024-12-24 ENCOUNTER — Encounter: Payer: Self-pay | Admitting: Physician Assistant

## 2024-12-24 VITALS — Ht 64.0 in | Wt 143.4 lb

## 2024-12-24 DIAGNOSIS — M810 Age-related osteoporosis without current pathological fracture: Secondary | ICD-10-CM | POA: Diagnosis not present

## 2024-12-24 MED ORDER — ROMOSOZUMAB-AQQG 105 MG/1.17ML ~~LOC~~ SOSY: 210.0000 mg | PREFILLED_SYRINGE | Freq: Once | SUBCUTANEOUS | Status: AC
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
## 2024-12-25 LAB — EXTRA LAV TOP TUBE

## 2024-12-25 LAB — VITAMIN D 25 HYDROXY (VIT D DEFICIENCY, FRACTURES): Vit D, 25-Hydroxy: 96 ng/mL (ref 30–100)

## 2025-01-11 ENCOUNTER — Ambulatory Visit: Attending: Student in an Organized Health Care Education/Training Program

## 2025-01-11 DIAGNOSIS — I442 Atrioventricular block, complete: Secondary | ICD-10-CM

## 2025-01-11 LAB — CUP PACEART REMOTE DEVICE CHECK
Battery Voltage: 40
Date Time Interrogation Session: 20260116095648
Implantable Lead Connection Status: 753985
Implantable Lead Connection Status: 753985
Implantable Lead Implant Date: 20171017
Implantable Lead Implant Date: 20171017
Implantable Lead Location: 753859
Implantable Lead Location: 753860
Implantable Lead Model: 377
Implantable Lead Model: 377
Implantable Lead Serial Number: 49593512
Implantable Lead Serial Number: 49620910
Implantable Pulse Generator Implant Date: 20171017
Pulse Gen Model: 394969
Pulse Gen Serial Number: 68784192

## 2025-01-13 ENCOUNTER — Ambulatory Visit: Payer: Self-pay | Admitting: Student in an Organized Health Care Education/Training Program

## 2025-01-17 NOTE — Progress Notes (Signed)
 Remote PPM Transmission

## 2025-01-28 ENCOUNTER — Other Ambulatory Visit: Payer: Self-pay | Admitting: Radiology

## 2025-01-28 MED ORDER — EVENITY 105 MG/1.17ML ~~LOC~~ SOSY: 210.0000 mg | PREFILLED_SYRINGE | Freq: Once | SUBCUTANEOUS | 0 refills | Status: AC

## 2025-02-07 ENCOUNTER — Ambulatory Visit: Admitting: Physician Assistant

## 2025-04-12 ENCOUNTER — Ambulatory Visit

## 2025-07-12 ENCOUNTER — Ambulatory Visit

## 2025-10-11 ENCOUNTER — Ambulatory Visit

## 2026-01-10 ENCOUNTER — Ambulatory Visit

## 2026-04-11 ENCOUNTER — Ambulatory Visit
